# Patient Record
Sex: Female | Born: 1935
Health system: Southern US, Community
[De-identification: ages and names within clinical notes are randomized; demographics above are authoritative.]

## PROBLEM LIST (undated history)

## (undated) DIAGNOSIS — C50919 Malignant neoplasm of unspecified site of unspecified female breast: Secondary | ICD-10-CM

## (undated) DIAGNOSIS — N182 Chronic kidney disease, stage 2 (mild): Secondary | ICD-10-CM

## (undated) DIAGNOSIS — H9190 Unspecified hearing loss, unspecified ear: Secondary | ICD-10-CM

## (undated) DIAGNOSIS — E079 Disorder of thyroid, unspecified: Secondary | ICD-10-CM

## (undated) DIAGNOSIS — M109 Gout, unspecified: Secondary | ICD-10-CM

## (undated) DIAGNOSIS — E785 Hyperlipidemia, unspecified: Secondary | ICD-10-CM

## (undated) DIAGNOSIS — R0602 Shortness of breath: Secondary | ICD-10-CM

## (undated) DIAGNOSIS — K219 Gastro-esophageal reflux disease without esophagitis: Secondary | ICD-10-CM

## (undated) DIAGNOSIS — H409 Unspecified glaucoma: Secondary | ICD-10-CM

## (undated) DIAGNOSIS — E039 Hypothyroidism, unspecified: Secondary | ICD-10-CM

## (undated) DIAGNOSIS — Z95828 Presence of other vascular implants and grafts: Secondary | ICD-10-CM

## (undated) DIAGNOSIS — J449 Chronic obstructive pulmonary disease, unspecified: Secondary | ICD-10-CM

## (undated) DIAGNOSIS — I10 Essential (primary) hypertension: Secondary | ICD-10-CM

## (undated) DIAGNOSIS — Z9981 Dependence on supplemental oxygen: Secondary | ICD-10-CM

## (undated) HISTORY — DX: Chronic obstructive pulmonary disease, unspecified: J44.9

## (undated) HISTORY — DX: Essential (primary) hypertension: I10

## (undated) HISTORY — DX: Shortness of breath: R06.02

## (undated) HISTORY — DX: Hyperlipidemia, unspecified: E78.5

## (undated) HISTORY — DX: Malignant neoplasm of unspecified site of unspecified female breast: C50.919

## (undated) HISTORY — DX: Presence of other vascular implants and grafts: Z95.828

## (undated) HISTORY — PX: TONSILLECTOMY: SUR1361

## (undated) HISTORY — DX: Gout, unspecified: M10.9

## (undated) HISTORY — DX: Chronic kidney disease, stage 2 (mild): N18.2

## (undated) HISTORY — DX: Disorder of thyroid, unspecified: E07.9

---
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
## 1988-09-02 HISTORY — PX: CHOLECYSTECTOMY: SHX55

## 1992-09-02 HISTORY — PX: THYROID SURGERY: SHX805

## 1994-09-02 HISTORY — PX: TOTAL HIP ARTHROPLASTY: SHX124

## 1996-09-02 HISTORY — PX: ANKLE SURGERY: SHX546

## 1998-06-26 ENCOUNTER — Ambulatory Visit (HOSPITAL_BASED_OUTPATIENT_CLINIC_OR_DEPARTMENT_OTHER): Admission: RE | Admit: 1998-06-26 | Discharge: 1998-06-26 | Payer: Self-pay | Admitting: Orthopedic Surgery

## 2000-03-31 ENCOUNTER — Encounter: Admission: RE | Admit: 2000-03-31 | Discharge: 2000-03-31 | Payer: Self-pay | Admitting: Orthopedic Surgery

## 2000-03-31 ENCOUNTER — Ambulatory Visit (HOSPITAL_BASED_OUTPATIENT_CLINIC_OR_DEPARTMENT_OTHER): Admission: RE | Admit: 2000-03-31 | Discharge: 2000-03-31 | Payer: Self-pay | Admitting: Orthopedic Surgery

## 2000-03-31 ENCOUNTER — Encounter: Payer: Self-pay | Admitting: Orthopedic Surgery

## 2001-01-27 ENCOUNTER — Ambulatory Visit (HOSPITAL_COMMUNITY): Admission: RE | Admit: 2001-01-27 | Discharge: 2001-01-27 | Payer: Self-pay | Admitting: Internal Medicine

## 2001-01-27 ENCOUNTER — Encounter: Payer: Self-pay | Admitting: Internal Medicine

## 2002-01-28 ENCOUNTER — Encounter: Payer: Self-pay | Admitting: Internal Medicine

## 2002-01-28 ENCOUNTER — Ambulatory Visit (HOSPITAL_COMMUNITY): Admission: RE | Admit: 2002-01-28 | Discharge: 2002-01-28 | Payer: Self-pay | Admitting: Internal Medicine

## 2003-02-03 ENCOUNTER — Encounter: Payer: Self-pay | Admitting: Internal Medicine

## 2003-02-03 ENCOUNTER — Ambulatory Visit (HOSPITAL_COMMUNITY): Admission: RE | Admit: 2003-02-03 | Discharge: 2003-02-03 | Payer: Self-pay | Admitting: Internal Medicine

## 2004-02-06 ENCOUNTER — Ambulatory Visit (HOSPITAL_COMMUNITY): Admission: RE | Admit: 2004-02-06 | Discharge: 2004-02-06 | Payer: Self-pay | Admitting: Internal Medicine

## 2004-03-19 ENCOUNTER — Ambulatory Visit (HOSPITAL_COMMUNITY): Admission: RE | Admit: 2004-03-19 | Discharge: 2004-03-19 | Payer: Self-pay | Admitting: Internal Medicine

## 2005-02-13 ENCOUNTER — Ambulatory Visit (HOSPITAL_COMMUNITY): Admission: RE | Admit: 2005-02-13 | Discharge: 2005-02-13 | Payer: Self-pay | Admitting: Internal Medicine

## 2006-02-17 ENCOUNTER — Ambulatory Visit (HOSPITAL_COMMUNITY): Admission: RE | Admit: 2006-02-17 | Discharge: 2006-02-17 | Payer: Self-pay | Admitting: Internal Medicine

## 2007-03-04 ENCOUNTER — Ambulatory Visit (HOSPITAL_COMMUNITY): Admission: RE | Admit: 2007-03-04 | Discharge: 2007-03-04 | Payer: Self-pay | Admitting: Internal Medicine

## 2007-09-09 ENCOUNTER — Ambulatory Visit (HOSPITAL_COMMUNITY): Admission: RE | Admit: 2007-09-09 | Discharge: 2007-09-09 | Payer: Self-pay | Admitting: Internal Medicine

## 2008-03-09 ENCOUNTER — Ambulatory Visit (HOSPITAL_COMMUNITY): Admission: RE | Admit: 2008-03-09 | Discharge: 2008-03-09 | Payer: Self-pay | Admitting: Internal Medicine

## 2008-12-07 ENCOUNTER — Ambulatory Visit (HOSPITAL_COMMUNITY): Admission: RE | Admit: 2008-12-07 | Discharge: 2008-12-07 | Payer: Self-pay | Admitting: Internal Medicine

## 2009-03-15 ENCOUNTER — Ambulatory Visit (HOSPITAL_COMMUNITY): Admission: RE | Admit: 2009-03-15 | Discharge: 2009-03-15 | Payer: Self-pay | Admitting: Internal Medicine

## 2009-09-02 HISTORY — PX: BREAST LUMPECTOMY: SHX2

## 2010-03-08 DIAGNOSIS — C50819 Malignant neoplasm of overlapping sites of unspecified female breast: Secondary | ICD-10-CM | POA: Insufficient documentation

## 2010-03-16 ENCOUNTER — Ambulatory Visit (HOSPITAL_COMMUNITY): Admission: RE | Admit: 2010-03-16 | Discharge: 2010-03-16 | Payer: Self-pay | Admitting: Internal Medicine

## 2010-03-28 ENCOUNTER — Ambulatory Visit (HOSPITAL_COMMUNITY): Admission: RE | Admit: 2010-03-28 | Discharge: 2010-03-28 | Payer: Self-pay | Admitting: Internal Medicine

## 2010-04-02 ENCOUNTER — Ambulatory Visit (HOSPITAL_COMMUNITY): Admission: RE | Admit: 2010-04-02 | Discharge: 2010-04-02 | Payer: Self-pay | Admitting: Internal Medicine

## 2010-04-04 ENCOUNTER — Ambulatory Visit (HOSPITAL_COMMUNITY): Admission: RE | Admit: 2010-04-04 | Discharge: 2010-04-04 | Payer: Self-pay | Admitting: Internal Medicine

## 2010-04-20 ENCOUNTER — Encounter: Admission: RE | Admit: 2010-04-20 | Discharge: 2010-04-20 | Payer: Self-pay | Admitting: Surgery

## 2010-04-25 ENCOUNTER — Encounter: Admission: RE | Admit: 2010-04-25 | Discharge: 2010-04-25 | Payer: Self-pay | Admitting: Surgery

## 2010-04-25 ENCOUNTER — Ambulatory Visit (HOSPITAL_BASED_OUTPATIENT_CLINIC_OR_DEPARTMENT_OTHER): Admission: RE | Admit: 2010-04-25 | Discharge: 2010-04-25 | Payer: Self-pay | Admitting: Surgery

## 2010-05-08 ENCOUNTER — Ambulatory Visit: Admission: RE | Admit: 2010-05-08 | Discharge: 2010-07-09 | Payer: Self-pay | Admitting: Radiation Oncology

## 2010-05-09 ENCOUNTER — Ambulatory Visit (HOSPITAL_COMMUNITY): Payer: Self-pay | Admitting: Oncology

## 2010-05-09 ENCOUNTER — Encounter (HOSPITAL_COMMUNITY): Admission: RE | Admit: 2010-05-09 | Discharge: 2010-06-01 | Payer: Self-pay | Admitting: Oncology

## 2010-05-11 ENCOUNTER — Encounter (HOSPITAL_COMMUNITY): Payer: Self-pay | Admitting: Oncology

## 2010-05-11 ENCOUNTER — Ambulatory Visit: Payer: Self-pay | Admitting: Cardiology

## 2010-05-23 ENCOUNTER — Ambulatory Visit (HOSPITAL_BASED_OUTPATIENT_CLINIC_OR_DEPARTMENT_OTHER): Admission: RE | Admit: 2010-05-23 | Discharge: 2010-05-23 | Payer: Self-pay | Admitting: Surgery

## 2010-05-23 HISTORY — PX: PORTACATH PLACEMENT: SHX2246

## 2010-06-02 ENCOUNTER — Emergency Department (HOSPITAL_COMMUNITY): Admission: EM | Admit: 2010-06-02 | Discharge: 2010-06-02 | Payer: Self-pay | Admitting: Emergency Medicine

## 2010-06-06 ENCOUNTER — Encounter (HOSPITAL_COMMUNITY): Admission: RE | Admit: 2010-06-06 | Discharge: 2010-07-06 | Payer: Self-pay | Admitting: Oncology

## 2010-06-25 ENCOUNTER — Ambulatory Visit (HOSPITAL_COMMUNITY): Payer: Self-pay | Admitting: Oncology

## 2010-07-16 ENCOUNTER — Encounter (HOSPITAL_COMMUNITY)
Admission: RE | Admit: 2010-07-16 | Discharge: 2010-08-15 | Payer: Self-pay | Source: Home / Self Care | Attending: Oncology | Admitting: Oncology

## 2010-07-21 ENCOUNTER — Ambulatory Visit
Admission: RE | Admit: 2010-07-21 | Discharge: 2010-08-20 | Payer: Self-pay | Source: Home / Self Care | Attending: Radiation Oncology | Admitting: Radiation Oncology

## 2010-08-15 ENCOUNTER — Encounter (HOSPITAL_COMMUNITY)
Admission: RE | Admit: 2010-08-15 | Discharge: 2010-09-14 | Payer: Self-pay | Source: Home / Self Care | Attending: Oncology | Admitting: Oncology

## 2010-08-15 ENCOUNTER — Ambulatory Visit (HOSPITAL_COMMUNITY): Payer: Self-pay | Admitting: Oncology

## 2010-08-16 ENCOUNTER — Encounter (HOSPITAL_COMMUNITY): Payer: Self-pay | Admitting: Oncology

## 2010-09-04 ENCOUNTER — Ambulatory Visit
Admission: RE | Admit: 2010-09-04 | Discharge: 2010-10-02 | Payer: Self-pay | Source: Home / Self Care | Attending: Radiation Oncology | Admitting: Radiation Oncology

## 2010-09-05 LAB — COMPREHENSIVE METABOLIC PANEL
ALT: 17 U/L (ref 0–35)
AST: 21 U/L (ref 0–37)
Albumin: 3.8 g/dL (ref 3.5–5.2)
Alkaline Phosphatase: 66 U/L (ref 39–117)
BUN: 22 mg/dL (ref 6–23)
CO2: 25 mEq/L (ref 19–32)
Calcium: 9.5 mg/dL (ref 8.4–10.5)
Chloride: 101 mEq/L (ref 96–112)
Creatinine, Ser: 0.87 mg/dL (ref 0.4–1.2)
GFR calc Af Amer: >60 mL/min (ref >60)
GFR calc non Af Amer: >60 mL/min (ref >60)
Glucose, Bld: 115 mg/dL — ABNORMAL HIGH (ref 70–99)
Potassium: 4 mEq/L (ref 3.5–5.1)
Sodium: 139 mEq/L (ref 135–145)
Total Bilirubin: 0.3 mg/dL (ref 0.3–1.2)
Total Protein: 7.3 g/dL (ref 6.0–8.3)

## 2010-09-05 LAB — CBC
HCT: 31.3 % — ABNORMAL LOW (ref 36.0–46.0)
Hemoglobin: 11 g/dL — ABNORMAL LOW (ref 12.0–15.0)
MCH: 32.1 pg (ref 26.0–34.0)
MCHC: 35.1 g/dL (ref 30.0–36.0)
MCV: 91.3 fL (ref 78.0–100.0)
Platelets: 213 10*3/uL (ref 150–400)
RBC: 3.43 MIL/uL — ABNORMAL LOW (ref 3.87–5.11)
RDW: 14.7 % (ref 11.5–15.5)
WBC: 5.1 10*3/uL (ref 4.0–10.5)

## 2010-09-05 LAB — DIFFERENTIAL
Basophils Absolute: 0 10*3/uL (ref 0.0–0.1)
Basophils Relative: 0 % (ref 0–1)
Eosinophils Absolute: 0.2 10*3/uL (ref 0.0–0.7)
Eosinophils Relative: 4 % (ref 0–5)
Lymphocytes Relative: 29 % (ref 12–46)
Lymphs Abs: 1.5 10*3/uL (ref 0.7–4.0)
Monocytes Absolute: 0.5 10*3/uL (ref 0.1–1.0)
Monocytes Relative: 10 % (ref 3–12)
Neutro Abs: 2.9 10*3/uL (ref 1.7–7.7)
Neutrophils Relative %: 56 % (ref 43–77)

## 2010-09-26 ENCOUNTER — Encounter (HOSPITAL_COMMUNITY)
Admission: RE | Admit: 2010-09-26 | Discharge: 2010-10-02 | Payer: Self-pay | Source: Home / Self Care | Attending: Oncology | Admitting: Oncology

## 2010-09-26 LAB — CBC
Hemoglobin: 11.3 g/dL — ABNORMAL LOW (ref 12.0–15.0)
MCH: 31.4 pg (ref 26.0–34.0)
MCV: 90.8 fL (ref 78.0–100.0)
RBC: 3.6 MIL/uL — ABNORMAL LOW (ref 3.87–5.11)

## 2010-09-26 LAB — COMPREHENSIVE METABOLIC PANEL
BUN: 22 mg/dL (ref 6–23)
CO2: 26 mEq/L (ref 19–32)
Chloride: 101 mEq/L (ref 96–112)
Creatinine, Ser: 0.87 mg/dL (ref 0.4–1.2)
GFR calc non Af Amer: >60 mL/min (ref >60)
Total Bilirubin: 0.3 mg/dL (ref 0.3–1.2)

## 2010-09-26 LAB — DIFFERENTIAL
Eosinophils Absolute: 0.5 10*3/uL (ref 0.0–0.7)
Lymphs Abs: 1.4 10*3/uL (ref 0.7–4.0)
Monocytes Relative: 9 % (ref 3–12)
Neutro Abs: 3.1 10*3/uL (ref 1.7–7.7)
Neutrophils Relative %: 56 % (ref 43–77)

## 2010-10-03 ENCOUNTER — Ambulatory Visit: Payer: Self-pay | Admitting: Radiation Oncology

## 2010-10-16 ENCOUNTER — Inpatient Hospital Stay (HOSPITAL_COMMUNITY): Payer: Medicare Other

## 2010-10-16 DIAGNOSIS — C50919 Malignant neoplasm of unspecified site of unspecified female breast: Secondary | ICD-10-CM

## 2010-10-16 DIAGNOSIS — Z5112 Encounter for antineoplastic immunotherapy: Secondary | ICD-10-CM

## 2010-11-01 ENCOUNTER — Other Ambulatory Visit: Payer: Self-pay | Admitting: Cardiology

## 2010-11-05 ENCOUNTER — Ambulatory Visit (HOSPITAL_COMMUNITY): Payer: Medicare Other | Admitting: Oncology

## 2010-11-05 DIAGNOSIS — C50919 Malignant neoplasm of unspecified site of unspecified female breast: Secondary | ICD-10-CM

## 2010-11-06 ENCOUNTER — Encounter (HOSPITAL_COMMUNITY): Payer: Medicare Other | Attending: Oncology

## 2010-11-06 ENCOUNTER — Other Ambulatory Visit (HOSPITAL_COMMUNITY): Payer: Medicare Other

## 2010-11-06 ENCOUNTER — Inpatient Hospital Stay (HOSPITAL_COMMUNITY): Payer: Medicare Other

## 2010-11-06 DIAGNOSIS — C50919 Malignant neoplasm of unspecified site of unspecified female breast: Secondary | ICD-10-CM | POA: Insufficient documentation

## 2010-11-06 DIAGNOSIS — E119 Type 2 diabetes mellitus without complications: Secondary | ICD-10-CM | POA: Insufficient documentation

## 2010-11-06 DIAGNOSIS — E039 Hypothyroidism, unspecified: Secondary | ICD-10-CM | POA: Insufficient documentation

## 2010-11-06 DIAGNOSIS — Z5112 Encounter for antineoplastic immunotherapy: Secondary | ICD-10-CM

## 2010-11-06 DIAGNOSIS — I10 Essential (primary) hypertension: Secondary | ICD-10-CM | POA: Insufficient documentation

## 2010-11-06 DIAGNOSIS — Z79899 Other long term (current) drug therapy: Secondary | ICD-10-CM | POA: Insufficient documentation

## 2010-11-13 LAB — COMPREHENSIVE METABOLIC PANEL
ALT: 15 U/L (ref 0–35)
ALT: 18 U/L (ref 0–35)
AST: 19 U/L (ref 0–37)
AST: 33 U/L (ref 0–37)
Albumin: 3.5 g/dL (ref 3.5–5.2)
BUN: 20 mg/dL (ref 6–23)
CO2: 20 mEq/L (ref 19–32)
CO2: 26 mEq/L (ref 19–32)
Calcium: 8.4 mg/dL (ref 8.4–10.5)
Calcium: 8.7 mg/dL (ref 8.4–10.5)
Calcium: 9.3 mg/dL (ref 8.4–10.5)
Chloride: 106 mEq/L (ref 96–112)
Chloride: 106 mEq/L (ref 96–112)
Creatinine, Ser: 0.89 mg/dL (ref 0.4–1.2)
Creatinine, Ser: 1.02 mg/dL (ref 0.4–1.2)
GFR calc Af Amer: >60 mL/min (ref >60)
GFR calc non Af Amer: 58 mL/min — ABNORMAL LOW (ref >60)
GFR calc non Af Amer: >60 mL/min (ref >60)
Glucose, Bld: 307 mg/dL — ABNORMAL HIGH (ref 70–99)
Potassium: 4 mEq/L (ref 3.5–5.1)
Sodium: 140 mEq/L (ref 135–145)
Total Bilirubin: 0.2 mg/dL — ABNORMAL LOW (ref 0.3–1.2)
Total Protein: 7.1 g/dL (ref 6.0–8.3)

## 2010-11-13 LAB — CBC
HCT: 29.1 % — ABNORMAL LOW (ref 36.0–46.0)
Hemoglobin: 10.3 g/dL — ABNORMAL LOW (ref 12.0–15.0)
Hemoglobin: 9.3 g/dL — ABNORMAL LOW (ref 12.0–15.0)
MCH: 30.7 pg (ref 26.0–34.0)
MCH: 30.9 pg (ref 26.0–34.0)
MCHC: 35 g/dL (ref 30.0–36.0)
MCHC: 35.4 g/dL (ref 30.0–36.0)
MCV: 88.2 fL (ref 78.0–100.0)
Platelets: 206 10*3/uL (ref 150–400)
RBC: 3.03 MIL/uL — ABNORMAL LOW (ref 3.87–5.11)
RBC: 3.35 MIL/uL — ABNORMAL LOW (ref 3.87–5.11)
RDW: 15.4 % (ref 11.5–15.5)
WBC: 5.4 10*3/uL (ref 4.0–10.5)

## 2010-11-13 LAB — DIFFERENTIAL
Basophils Absolute: 0 10*3/uL (ref 0.0–0.1)
Eosinophils Absolute: 0 10*3/uL (ref 0.0–0.7)
Eosinophils Absolute: 0 10*3/uL (ref 0.0–0.7)
Eosinophils Relative: 0 % (ref 0–5)
Eosinophils Relative: 1 % (ref 0–5)
Lymphocytes Relative: 18 % (ref 12–46)
Lymphs Abs: 0.8 10*3/uL (ref 0.7–4.0)
Lymphs Abs: 1 10*3/uL (ref 0.7–4.0)
Lymphs Abs: 1.5 10*3/uL (ref 0.7–4.0)
Monocytes Absolute: 0 10*3/uL — ABNORMAL LOW (ref 0.1–1.0)
Monocytes Absolute: 0.5 10*3/uL (ref 0.1–1.0)
Monocytes Relative: 0 % — ABNORMAL LOW (ref 3–12)
Neutro Abs: 4.4 10*3/uL (ref 1.7–7.7)
Neutrophils Relative %: 80 % — ABNORMAL HIGH (ref 43–77)
Neutrophils Relative %: 81 % — ABNORMAL HIGH (ref 43–77)

## 2010-11-14 ENCOUNTER — Ambulatory Visit (HOSPITAL_COMMUNITY)
Admission: RE | Admit: 2010-11-14 | Discharge: 2010-11-14 | Disposition: A | Discharge status: Home / Self Care | Payer: Medicare Other | Source: Ambulatory Visit | Attending: Oncology | Admitting: Oncology

## 2010-11-14 DIAGNOSIS — C50919 Malignant neoplasm of unspecified site of unspecified female breast: Secondary | ICD-10-CM | POA: Insufficient documentation

## 2010-11-14 DIAGNOSIS — I10 Essential (primary) hypertension: Secondary | ICD-10-CM | POA: Insufficient documentation

## 2010-11-14 DIAGNOSIS — I059 Rheumatic mitral valve disease, unspecified: Secondary | ICD-10-CM

## 2010-11-14 DIAGNOSIS — Z9221 Personal history of antineoplastic chemotherapy: Secondary | ICD-10-CM | POA: Insufficient documentation

## 2010-11-14 DIAGNOSIS — E119 Type 2 diabetes mellitus without complications: Secondary | ICD-10-CM | POA: Insufficient documentation

## 2010-11-15 LAB — CBC
HCT: 32.3 % — ABNORMAL LOW (ref 36.0–46.0)
HCT: 33.5 % — ABNORMAL LOW (ref 36.0–46.0)
HCT: 33.7 % — ABNORMAL LOW (ref 36.0–46.0)
HCT: 37.9 % (ref 36.0–46.0)
Hemoglobin: 11.3 g/dL — ABNORMAL LOW (ref 12.0–15.0)
Hemoglobin: 11.5 g/dL — ABNORMAL LOW (ref 12.0–15.0)
Hemoglobin: 11.7 g/dL — ABNORMAL LOW (ref 12.0–15.0)
Hemoglobin: 12.1 g/dL (ref 12.0–15.0)
Hemoglobin: 12.8 g/dL (ref 12.0–15.0)
MCH: 29.4 pg (ref 26.0–34.0)
MCH: 30.4 pg (ref 26.0–34.0)
MCH: 30.4 pg (ref 26.0–34.0)
MCH: 30.5 pg (ref 26.0–34.0)
MCH: 30.5 pg (ref 26.0–34.0)
MCHC: 33.1 g/dL (ref 30.0–36.0)
MCHC: 34.4 g/dL (ref 30.0–36.0)
MCHC: 34.8 g/dL (ref 30.0–36.0)
MCHC: 35 g/dL (ref 30.0–36.0)
MCV: 87.1 fL (ref 78.0–100.0)
MCV: 87.3 fL (ref 78.0–100.0)
MCV: 88.6 fL (ref 78.0–100.0)
MCV: 88.7 fL (ref 78.0–100.0)
Platelets: 139 K/uL — ABNORMAL LOW (ref 150–400)
Platelets: 180 10*3/uL (ref 150–400)
Platelets: 193 K/uL (ref 150–400)
Platelets: 220 K/uL (ref 150–400)
Platelets: 252 10*3/uL (ref 150–400)
RBC: 3.71 MIL/uL — ABNORMAL LOW (ref 3.87–5.11)
RBC: 3.78 MIL/uL — ABNORMAL LOW (ref 3.87–5.11)
RBC: 3.86 MIL/uL — ABNORMAL LOW (ref 3.87–5.11)
RBC: 3.98 MIL/uL (ref 3.87–5.11)
RBC: 4.28 MIL/uL (ref 3.87–5.11)
RBC: 4.66 MIL/uL (ref 3.87–5.11)
RDW: 12.9 % (ref 11.5–15.5)
RDW: 13.4 % (ref 11.5–15.5)
RDW: 13.6 % (ref 11.5–15.5)
RDW: 13.7 % (ref 11.5–15.5)
WBC: 10.5 K/uL (ref 4.0–10.5)
WBC: 2.4 K/uL — ABNORMAL LOW (ref 4.0–10.5)
WBC: 3.5 10*3/uL — ABNORMAL LOW (ref 4.0–10.5)
WBC: 5.6 K/uL (ref 4.0–10.5)
WBC: 8 10*3/uL (ref 4.0–10.5)

## 2010-11-15 LAB — COMPREHENSIVE METABOLIC PANEL WITH GFR
ALT: 18 U/L (ref 0–35)
ALT: 19 U/L (ref 0–35)
AST: 29 U/L (ref 0–37)
AST: 33 U/L (ref 0–37)
Albumin: 3.6 g/dL (ref 3.5–5.2)
Albumin: 3.7 g/dL (ref 3.5–5.2)
Alkaline Phosphatase: 70 U/L (ref 39–117)
Alkaline Phosphatase: 78 U/L (ref 39–117)
BUN: 20 mg/dL (ref 6–23)
BUN: 23 mg/dL (ref 6–23)
CO2: 23 meq/L (ref 19–32)
CO2: 25 meq/L (ref 19–32)
Calcium: 9.2 mg/dL (ref 8.4–10.5)
Calcium: 9.2 mg/dL (ref 8.4–10.5)
Chloride: 101 meq/L (ref 96–112)
Chloride: 103 meq/L (ref 96–112)
Creatinine, Ser: 0.93 mg/dL (ref 0.4–1.2)
Creatinine, Ser: 1.03 mg/dL (ref 0.4–1.2)
GFR calc Af Amer: >60 mL/min (ref >60)
GFR calc Af Amer: >60 mL/min (ref >60)
GFR calc non Af Amer: 52 mL/min — ABNORMAL LOW (ref >60)
GFR calc non Af Amer: 59 mL/min — ABNORMAL LOW (ref >60)
Glucose, Bld: 267 mg/dL — ABNORMAL HIGH (ref 70–99)
Glucose, Bld: 355 mg/dL — ABNORMAL HIGH (ref 70–99)
Potassium: 3.9 meq/L (ref 3.5–5.1)
Potassium: 4.2 meq/L (ref 3.5–5.1)
Sodium: 135 meq/L (ref 135–145)
Sodium: 136 meq/L (ref 135–145)
Total Bilirubin: 0.2 mg/dL — ABNORMAL LOW (ref 0.3–1.2)
Total Bilirubin: 0.4 mg/dL (ref 0.3–1.2)
Total Protein: 7 g/dL (ref 6.0–8.3)
Total Protein: 7.1 g/dL (ref 6.0–8.3)

## 2010-11-15 LAB — DIFFERENTIAL
Basophils Absolute: 0 10*3/uL (ref 0.0–0.1)
Basophils Absolute: 0 K/uL (ref 0.0–0.1)
Basophils Absolute: 0 K/uL (ref 0.0–0.1)
Basophils Absolute: 0.1 K/uL (ref 0.0–0.1)
Basophils Relative: 0 % (ref 0–1)
Basophils Relative: 0 % (ref 0–1)
Basophils Relative: 1 % (ref 0–1)
Basophils Relative: 1 % (ref 0–1)
Eosinophils Absolute: 0 K/uL (ref 0.0–0.7)
Eosinophils Absolute: 0 K/uL (ref 0.0–0.7)
Eosinophils Absolute: 0.2 10*3/uL (ref 0.0–0.7)
Eosinophils Absolute: 0.3 10*3/uL (ref 0.0–0.7)
Eosinophils Absolute: 0.3 K/uL (ref 0.0–0.7)
Eosinophils Absolute: 0.4 10*3/uL (ref 0.0–0.7)
Eosinophils Relative: 0 % (ref 0–5)
Eosinophils Relative: 0 % (ref 0–5)
Eosinophils Relative: 13 % — ABNORMAL HIGH (ref 0–5)
Eosinophils Relative: 4 % (ref 0–5)
Lymphocytes Relative: 15 % (ref 12–46)
Lymphocytes Relative: 16 % (ref 12–46)
Lymphocytes Relative: 24 % (ref 12–46)
Lymphocytes Relative: 24 % (ref 12–46)
Lymphocytes Relative: 43 % (ref 12–46)
Lymphs Abs: 0.8 10*3/uL (ref 0.7–4.0)
Lymphs Abs: 0.9 K/uL (ref 0.7–4.0)
Lymphs Abs: 1 K/uL (ref 0.7–4.0)
Lymphs Abs: 1.6 10*3/uL (ref 0.7–4.0)
Lymphs Abs: 1.6 K/uL (ref 0.7–4.0)
Monocytes Absolute: 0 K/uL — ABNORMAL LOW (ref 0.1–1.0)
Monocytes Absolute: 0.1 K/uL (ref 0.1–1.0)
Monocytes Absolute: 0.5 K/uL (ref 0.1–1.0)
Monocytes Absolute: 0.6 10*3/uL (ref 0.1–1.0)
Monocytes Relative: 1 % — ABNORMAL LOW (ref 3–12)
Monocytes Relative: 1 % — ABNORMAL LOW (ref 3–12)
Monocytes Relative: 23 % — ABNORMAL HIGH (ref 3–12)
Monocytes Relative: 3 % (ref 3–12)
Monocytes Relative: 9 % (ref 3–12)
Monocytes Relative: 9 % (ref 3–12)
Neutro Abs: 0.5 K/uL — ABNORMAL LOW (ref 1.7–7.7)
Neutro Abs: 2.1 10*3/uL (ref 1.7–7.7)
Neutro Abs: 4.7 K/uL (ref 1.7–7.7)
Neutro Abs: 5.2 10*3/uL (ref 1.7–7.7)
Neutro Abs: 8.7 K/uL — ABNORMAL HIGH (ref 1.7–7.7)
Neutrophils Relative %: 20 % — ABNORMAL LOW (ref 43–77)
Neutrophils Relative %: 61 % (ref 43–77)
Neutrophils Relative %: 65 % (ref 43–77)
Neutrophils Relative %: 83 % — ABNORMAL HIGH (ref 43–77)
Neutrophils Relative %: 84 % — ABNORMAL HIGH (ref 43–77)

## 2010-11-15 LAB — COMPREHENSIVE METABOLIC PANEL
ALT: 18 U/L (ref 0–35)
ALT: 22 U/L (ref 0–35)
AST: 23 U/L (ref 0–37)
Albumin: 3.9 g/dL (ref 3.5–5.2)
Alkaline Phosphatase: 84 U/L (ref 39–117)
BUN: 26 mg/dL — ABNORMAL HIGH (ref 6–23)
CO2: 27 mEq/L (ref 19–32)
Calcium: 9.7 mg/dL (ref 8.4–10.5)
Chloride: 101 mEq/L (ref 96–112)
Chloride: 104 mEq/L (ref 96–112)
Creatinine, Ser: 0.71 mg/dL (ref 0.4–1.2)
GFR calc Af Amer: >60 mL/min (ref >60)
Glucose, Bld: 99 mg/dL (ref 70–99)
Potassium: 5.4 mEq/L — ABNORMAL HIGH (ref 3.5–5.1)
Sodium: 137 mEq/L (ref 135–145)
Sodium: 140 mEq/L (ref 135–145)
Total Bilirubin: 0.5 mg/dL (ref 0.3–1.2)
Total Protein: 7.6 g/dL (ref 6.0–8.3)

## 2010-11-15 LAB — URINALYSIS, ROUTINE W REFLEX MICROSCOPIC
Bilirubin Urine: NEGATIVE
Glucose, UA: NEGATIVE mg/dL
Hgb urine dipstick: NEGATIVE
Ketones, ur: NEGATIVE mg/dL
pH: 6.5 (ref 5.0–8.0)

## 2010-11-15 LAB — CANCER ANTIGEN 27.29: CA 27.29: 19 U/mL (ref 0–39)

## 2010-11-15 LAB — BASIC METABOLIC PANEL WITH GFR
BUN: 19 mg/dL (ref 6–23)
CO2: 27 meq/L (ref 19–32)
Calcium: 9.4 mg/dL (ref 8.4–10.5)
Chloride: 108 meq/L (ref 96–112)
Creatinine, Ser: 0.69 mg/dL (ref 0.4–1.2)
GFR calc non Af Amer: >60 mL/min
Glucose, Bld: 129 mg/dL — ABNORMAL HIGH (ref 70–99)
Potassium: 4.6 meq/L (ref 3.5–5.1)
Sodium: 140 meq/L (ref 135–145)

## 2010-11-27 ENCOUNTER — Ambulatory Visit (HOSPITAL_COMMUNITY): Payer: Medicare Other

## 2010-11-27 DIAGNOSIS — Z5112 Encounter for antineoplastic immunotherapy: Secondary | ICD-10-CM

## 2010-11-27 DIAGNOSIS — C50919 Malignant neoplasm of unspecified site of unspecified female breast: Secondary | ICD-10-CM

## 2010-12-18 ENCOUNTER — Encounter (HOSPITAL_COMMUNITY): Payer: Medicare Other | Attending: Oncology

## 2010-12-18 ENCOUNTER — Inpatient Hospital Stay (HOSPITAL_COMMUNITY): Payer: Medicare Other

## 2010-12-18 DIAGNOSIS — E119 Type 2 diabetes mellitus without complications: Secondary | ICD-10-CM | POA: Insufficient documentation

## 2010-12-18 DIAGNOSIS — Z5111 Encounter for antineoplastic chemotherapy: Secondary | ICD-10-CM

## 2010-12-18 DIAGNOSIS — E039 Hypothyroidism, unspecified: Secondary | ICD-10-CM | POA: Insufficient documentation

## 2010-12-18 DIAGNOSIS — C50919 Malignant neoplasm of unspecified site of unspecified female breast: Secondary | ICD-10-CM | POA: Insufficient documentation

## 2010-12-18 DIAGNOSIS — I10 Essential (primary) hypertension: Secondary | ICD-10-CM | POA: Insufficient documentation

## 2010-12-18 DIAGNOSIS — Z79899 Other long term (current) drug therapy: Secondary | ICD-10-CM | POA: Insufficient documentation

## 2011-01-08 ENCOUNTER — Encounter (HOSPITAL_COMMUNITY): Payer: Medicare Other | Attending: Oncology

## 2011-01-08 ENCOUNTER — Other Ambulatory Visit (HOSPITAL_COMMUNITY): Payer: Self-pay | Admitting: Oncology

## 2011-01-08 ENCOUNTER — Inpatient Hospital Stay (HOSPITAL_COMMUNITY): Payer: Medicare Other

## 2011-01-08 DIAGNOSIS — Z79899 Other long term (current) drug therapy: Secondary | ICD-10-CM | POA: Insufficient documentation

## 2011-01-08 DIAGNOSIS — I10 Essential (primary) hypertension: Secondary | ICD-10-CM | POA: Insufficient documentation

## 2011-01-08 DIAGNOSIS — E119 Type 2 diabetes mellitus without complications: Secondary | ICD-10-CM | POA: Insufficient documentation

## 2011-01-08 DIAGNOSIS — C50919 Malignant neoplasm of unspecified site of unspecified female breast: Secondary | ICD-10-CM | POA: Insufficient documentation

## 2011-01-08 DIAGNOSIS — Z5112 Encounter for antineoplastic immunotherapy: Secondary | ICD-10-CM

## 2011-01-08 DIAGNOSIS — E039 Hypothyroidism, unspecified: Secondary | ICD-10-CM | POA: Insufficient documentation

## 2011-01-08 LAB — CBC
HCT: 32.8 % — ABNORMAL LOW (ref 36.0–46.0)
Hemoglobin: 10.9 g/dL — ABNORMAL LOW (ref 12.0–15.0)
MCV: 88.2 fL (ref 78.0–100.0)
Platelets: 196 10*3/uL (ref 150–400)
RBC: 3.72 MIL/uL — ABNORMAL LOW (ref 3.87–5.11)
WBC: 5.3 10*3/uL (ref 4.0–10.5)

## 2011-01-08 LAB — DIFFERENTIAL
Eosinophils Relative: 6 % — ABNORMAL HIGH (ref 0–5)
Lymphocytes Relative: 32 % (ref 12–46)
Lymphs Abs: 1.7 10*3/uL (ref 0.7–4.0)
Monocytes Relative: 9 % (ref 3–12)

## 2011-01-18 NOTE — Op Note (Signed)
[facility]  Patient:    Paige Rhodes                        MRN: [id number] Proc. Date: 03/31/00 Adm. Date:  [id number] Attending:  Twana First                           Operative Report  PREOPERATIVE DIAGNOSIS:  Retained hardware, right ankle, status post open reduction internal fixation trimalleolar fracture.  POSTOPERATIVE DIAGNOSIS:  Retained hardware, right ankle, status post open reduction internal fixation trimalleolar fracture.  PROCEDURE:  Removal of retained hardware, right ankle.  SURGEON:  Elana Alm. Thurston Hole, M.D.  ASSISTANT:  ______.  ANESTHESIA:  General.  OPERATIVE TIME:  Thirty minutes.  COMPLICATIONS:  None.  INDICATIONS FOR PROCEDURE:  Ms. Cargle is a 77 year old woman who is one and a half years post ORIF right ankle fracture.  The fracture has healed well but she is having hardware pain and is now to undergo removal of this hardware.  DESCRIPTION:  Ms. Figiel is brought to the operating room on March 31, 2000, placed on operative table in supine position.  After an adequate level of general anesthesia was obtained her right foot and leg was prepped using sterile Betadine and draped using sterile technique.  Leg was examined and calf tourniquet elevated 275 mm.  Initially through the previous medial malleolar incision a new 1.5 cm incision was made.  The underlying subcutaneous tissues were incised with a ______ with skin incision.  The screw heads were easily visualized and were removed along with the washers without complications.  Fracture was found to be well healed.  No signs of infection.  The lateral incision was also reopened through a 5 cm longitudinal incision.  The underlying subcutaneous tissues also incised revealing the underlying screw heads and plate.  Each of the screws were removed and the plate was removed.  No signs of infection and the fracture was found to be well healed.  Both wounds were  then irrigated and closed using 3-0 Vicryl and 4-0 nylon.  Sterile dressings were applied after the wound injected with 0.25% Marcaine and then the patient and tourniquet released, awakened, taken to recovery room in stable condition.  FOLLOW-UP CARE:  Ms. Feagan will be followed as an outpatient on Vicodin for pain.  See her back in the office in a week for sutures out and follow-up. DD:  03/31/00 TD:  03/31/00 Job: [id number]

## 2011-01-18 NOTE — Op Note (Signed)
NAME:  Paige Rhodes, Paige Rhodes                          ACCOUNT NO.:  [id number]   MEDICAL RECORD NO.:  [id number]                   PATIENT TYPE:  AMB   LOCATION:  DAY                                  FACILITY:  [facility]   PHYSICIAN:  Lionel December, M.D.                 DATE OF BIRTH:  1932/07/07   DATE OF PROCEDURE:  03/19/2004  DATE OF DISCHARGE:                                 OPERATIVE REPORT   PROCEDURE:  Total colonoscopy.   INDICATIONS FOR PROCEDURE:  [name] is a 77 year old Caucasian female who  is here for screening colonoscopy.  The procedure and risks were reviewed  with the patient, and informed consent was obtained.   PREOPERATIVE MEDICATIONS:  Demerol 15g IV, Versed 3 mg IV in divided dose.   FINDINGS:  The procedure was performed in the endoscopy suite.  The  patient's vital signs and O2 saturations were monitored during the procedure  and remained stable.  The patient was placed in the left lateral recumbent  position and rectal examination performed.  No abnormality noted on external  or digital exam.  The Olympus videoscope was placed into the rectum and  advanced into the region of the sigmoid colon and beyond.  The preparation  was satisfactory.  The scope was passed to the cecum which was identified by  the ileocecal valve and appendiceal stump.  Pictures were taken for the  record.  As the scope was withdrawn, the colonic mucosa was carefully  examined.  There were two tiny polyps, one at the distal transverse colon  and the other one at the sigmoid colon which were ablated by cold biopsy and  submitted in one container.  There were two tiny diverticula at the sigmoid  colon which were seen on the way in.  The rectal mucosa was normal.  The  scope was retroflexed to examine the anorectal junction which was  unremarkable.  The endoscope was straightened and withdrawn.  The patient  tolerated the procedure well.   FINAL DIAGNOSES:  1. Two tiny polyps which were ablated  by cold biopsy, one at the transverse     colon and another one at the sigmoid colon.  2. Two tiny diverticula at the sigmoid colon.   RECOMMENDATIONS:  1. High fiber diet.  2. I will be contacting the patient with the biopsy results and timing of     her next colonoscopy.  If one or both of these polyps are adenomas, she     will come back in 5 years but otherwise could wait 10 years before her     next screening exam.      ___________________________________________  Lionel December, M.D.   NR/MEDQ  D:  03/19/2004  T:  03/19/2004  Job:  [id number]   cc:   [name]. [name], M.D.  [address]  [place]  [place] [address]  Fax: [phone]

## 2011-01-29 ENCOUNTER — Encounter (HOSPITAL_COMMUNITY): Payer: Medicare Other | Admitting: Oncology

## 2011-01-29 DIAGNOSIS — C50919 Malignant neoplasm of unspecified site of unspecified female breast: Secondary | ICD-10-CM

## 2011-01-30 ENCOUNTER — Encounter (HOSPITAL_COMMUNITY): Payer: Medicare Other

## 2011-01-30 DIAGNOSIS — C50919 Malignant neoplasm of unspecified site of unspecified female breast: Secondary | ICD-10-CM

## 2011-01-30 DIAGNOSIS — Z5112 Encounter for antineoplastic immunotherapy: Secondary | ICD-10-CM

## 2011-02-04 ENCOUNTER — Other Ambulatory Visit (HOSPITAL_COMMUNITY): Payer: Medicare Other

## 2011-02-07 ENCOUNTER — Encounter (INDEPENDENT_AMBULATORY_CARE_PROVIDER_SITE_OTHER): Payer: Self-pay | Admitting: Surgery

## 2011-02-11 ENCOUNTER — Ambulatory Visit (HOSPITAL_COMMUNITY): Payer: Medicare Other

## 2011-02-11 ENCOUNTER — Ambulatory Visit (HOSPITAL_COMMUNITY)
Admission: RE | Admit: 2011-02-11 | Discharge: 2011-02-11 | Disposition: A | Discharge status: Home / Self Care | Payer: Medicare Other | Source: Ambulatory Visit | Attending: Oncology | Admitting: Oncology

## 2011-02-11 DIAGNOSIS — E119 Type 2 diabetes mellitus without complications: Secondary | ICD-10-CM | POA: Insufficient documentation

## 2011-02-11 DIAGNOSIS — Z09 Encounter for follow-up examination after completed treatment for conditions other than malignant neoplasm: Secondary | ICD-10-CM

## 2011-02-11 DIAGNOSIS — C50919 Malignant neoplasm of unspecified site of unspecified female breast: Secondary | ICD-10-CM | POA: Insufficient documentation

## 2011-02-11 DIAGNOSIS — Z9221 Personal history of antineoplastic chemotherapy: Secondary | ICD-10-CM | POA: Insufficient documentation

## 2011-02-11 DIAGNOSIS — I10 Essential (primary) hypertension: Secondary | ICD-10-CM | POA: Insufficient documentation

## 2011-02-11 DIAGNOSIS — Z79899 Other long term (current) drug therapy: Secondary | ICD-10-CM | POA: Insufficient documentation

## 2011-02-12 ENCOUNTER — Encounter (HOSPITAL_COMMUNITY): Payer: Self-pay | Admitting: *Deleted

## 2011-02-19 ENCOUNTER — Encounter (HOSPITAL_COMMUNITY): Payer: Self-pay | Admitting: *Deleted

## 2011-02-20 ENCOUNTER — Other Ambulatory Visit (HOSPITAL_COMMUNITY): Payer: Self-pay | Admitting: Oncology

## 2011-02-20 ENCOUNTER — Encounter (HOSPITAL_COMMUNITY): Payer: Medicare Other | Attending: Oncology

## 2011-02-20 DIAGNOSIS — I10 Essential (primary) hypertension: Secondary | ICD-10-CM | POA: Insufficient documentation

## 2011-02-20 DIAGNOSIS — E039 Hypothyroidism, unspecified: Secondary | ICD-10-CM | POA: Insufficient documentation

## 2011-02-20 DIAGNOSIS — Z79899 Other long term (current) drug therapy: Secondary | ICD-10-CM | POA: Insufficient documentation

## 2011-02-20 DIAGNOSIS — E119 Type 2 diabetes mellitus without complications: Secondary | ICD-10-CM | POA: Insufficient documentation

## 2011-02-20 DIAGNOSIS — C50919 Malignant neoplasm of unspecified site of unspecified female breast: Secondary | ICD-10-CM

## 2011-02-20 DIAGNOSIS — Z5111 Encounter for antineoplastic chemotherapy: Secondary | ICD-10-CM

## 2011-02-20 LAB — COMPREHENSIVE METABOLIC PANEL
ALT: 13 U/L (ref 0–35)
Alkaline Phosphatase: 71 U/L (ref 39–117)
CO2: 27 mEq/L (ref 19–32)
Calcium: 9.7 mg/dL (ref 8.4–10.5)
GFR calc Af Amer: >60 mL/min (ref >60)
GFR calc non Af Amer: >60 mL/min (ref >60)
Glucose, Bld: 118 mg/dL — ABNORMAL HIGH (ref 70–99)
Sodium: 138 mEq/L (ref 135–145)

## 2011-02-20 LAB — DIFFERENTIAL
Basophils Absolute: 0 10*3/uL (ref 0.0–0.1)
Eosinophils Absolute: 0.3 10*3/uL (ref 0.0–0.7)
Eosinophils Relative: 5 % (ref 0–5)
Lymphocytes Relative: 23 % (ref 12–46)

## 2011-02-20 LAB — CBC
Platelets: 194 10*3/uL (ref 150–400)
RDW: 13.8 % (ref 11.5–15.5)
WBC: 6 10*3/uL (ref 4.0–10.5)

## 2011-02-27 ENCOUNTER — Other Ambulatory Visit (HOSPITAL_COMMUNITY): Payer: Self-pay | Admitting: Internal Medicine

## 2011-02-27 DIAGNOSIS — Z139 Encounter for screening, unspecified: Secondary | ICD-10-CM

## 2011-02-27 DIAGNOSIS — C50919 Malignant neoplasm of unspecified site of unspecified female breast: Secondary | ICD-10-CM

## 2011-03-05 ENCOUNTER — Other Ambulatory Visit (HOSPITAL_COMMUNITY): Payer: Self-pay | Admitting: Oncology

## 2011-03-05 ENCOUNTER — Encounter (HOSPITAL_COMMUNITY): Payer: Self-pay | Admitting: Oncology

## 2011-03-05 DIAGNOSIS — C50919 Malignant neoplasm of unspecified site of unspecified female breast: Secondary | ICD-10-CM

## 2011-03-05 HISTORY — DX: Malignant neoplasm of unspecified site of unspecified female breast: C50.919

## 2011-03-12 ENCOUNTER — Encounter (HOSPITAL_COMMUNITY): Payer: Medicare Other | Attending: Oncology

## 2011-03-12 ENCOUNTER — Other Ambulatory Visit (HOSPITAL_COMMUNITY): Payer: Self-pay | Admitting: Oncology

## 2011-03-12 DIAGNOSIS — I10 Essential (primary) hypertension: Secondary | ICD-10-CM | POA: Insufficient documentation

## 2011-03-12 DIAGNOSIS — E119 Type 2 diabetes mellitus without complications: Secondary | ICD-10-CM | POA: Insufficient documentation

## 2011-03-12 DIAGNOSIS — C50919 Malignant neoplasm of unspecified site of unspecified female breast: Secondary | ICD-10-CM

## 2011-03-12 DIAGNOSIS — Z79899 Other long term (current) drug therapy: Secondary | ICD-10-CM | POA: Insufficient documentation

## 2011-03-12 DIAGNOSIS — E039 Hypothyroidism, unspecified: Secondary | ICD-10-CM | POA: Insufficient documentation

## 2011-03-12 LAB — COMPREHENSIVE METABOLIC PANEL
ALT: 14 U/L (ref 0–35)
Calcium: 9.9 mg/dL (ref 8.4–10.5)
GFR calc Af Amer: >60 mL/min (ref >60)
Glucose, Bld: 101 mg/dL — ABNORMAL HIGH (ref 70–99)
Sodium: 140 mEq/L (ref 135–145)
Total Protein: 7.4 g/dL (ref 6.0–8.3)

## 2011-03-13 ENCOUNTER — Encounter (HOSPITAL_BASED_OUTPATIENT_CLINIC_OR_DEPARTMENT_OTHER): Payer: Medicare Other

## 2011-03-13 ENCOUNTER — Ambulatory Visit (HOSPITAL_COMMUNITY): Payer: Medicare Other | Admitting: Oncology

## 2011-03-13 VITALS — BP 137/68 | HR 56 | Temp 97.1°F | Wt 171.4 lb

## 2011-03-13 DIAGNOSIS — Z5111 Encounter for antineoplastic chemotherapy: Secondary | ICD-10-CM

## 2011-03-13 DIAGNOSIS — C50919 Malignant neoplasm of unspecified site of unspecified female breast: Secondary | ICD-10-CM

## 2011-03-13 MED ORDER — HEPARIN SOD (PORK) LOCK FLUSH 100 UNIT/ML IV SOLN
INTRAVENOUS | Status: AC
  Administered 2011-03-13: 500 [IU]
  Filled 2011-03-13: qty 5

## 2011-03-13 MED ORDER — ALTEPLASE 2 MG IJ SOLR: 2.0000 mg | Freq: Once | INTRAMUSCULAR | Status: DC | PRN

## 2011-03-13 MED ORDER — SODIUM CHLORIDE 0.9 % IV SOLN
Freq: Once | INTRAVENOUS | Status: AC
  Administered 2011-03-13: 12:00:00 via INTRAVENOUS

## 2011-03-13 MED ORDER — SODIUM CHLORIDE 0.9 % IJ SOLN: 3.0000 mL | INTRAMUSCULAR | Status: DC | PRN

## 2011-03-13 MED ORDER — DIPHENHYDRAMINE HCL 50 MG/ML IJ SOLN: 25.0000 mg | Freq: Once | INTRAMUSCULAR | Status: DC | PRN

## 2011-03-13 MED ORDER — HEPARIN SOD (PORK) LOCK FLUSH 100 UNIT/ML IV SOLN: 250.0000 [IU] | Freq: Once | INTRAVENOUS | Status: DC | PRN

## 2011-03-13 MED ORDER — SODIUM CHLORIDE 0.9 % IJ SOLN: 10.0000 mL | INTRAMUSCULAR | Status: DC | PRN

## 2011-03-13 MED ORDER — EPINEPHRINE HCL 0.1 MG/ML IJ SOLN: 0.2500 mg | Freq: Once | INTRAMUSCULAR | Status: DC | PRN

## 2011-03-13 MED ORDER — SODIUM CHLORIDE 0.9 % IV SOLN: Freq: Once | INTRAVENOUS | Status: DC | PRN

## 2011-03-13 MED ORDER — DIPHENHYDRAMINE HCL 50 MG/ML IJ SOLN: 50.0000 mg | Freq: Once | INTRAMUSCULAR | Status: DC | PRN

## 2011-03-13 MED ORDER — HEPARIN SOD (PORK) LOCK FLUSH 100 UNIT/ML IV SOLN
500.0000 [IU] | Freq: Once | INTRAVENOUS | Status: AC | PRN
  Administered 2011-03-13: 500 [IU]

## 2011-03-13 MED ORDER — METHYLPREDNISOLONE SODIUM SUCC 125 MG IJ SOLR: 125.0000 mg | Freq: Once | INTRAMUSCULAR | Status: DC | PRN

## 2011-03-13 MED ORDER — ALBUTEROL SULFATE (2.5 MG/3ML) 0.083% IN NEBU: 2.5000 mg | INHALATION_SOLUTION | Freq: Once | RESPIRATORY_TRACT | Status: DC | PRN

## 2011-03-20 ENCOUNTER — Ambulatory Visit (HOSPITAL_COMMUNITY)
Admission: RE | Admit: 2011-03-20 | Discharge: 2011-03-20 | Disposition: A | Discharge status: Home / Self Care | Payer: Medicare Other | Source: Ambulatory Visit | Attending: Internal Medicine | Admitting: Internal Medicine

## 2011-03-20 DIAGNOSIS — Z09 Encounter for follow-up examination after completed treatment for conditions other than malignant neoplasm: Secondary | ICD-10-CM | POA: Insufficient documentation

## 2011-03-20 DIAGNOSIS — C50919 Malignant neoplasm of unspecified site of unspecified female breast: Secondary | ICD-10-CM | POA: Insufficient documentation

## 2011-04-02 NOTE — Progress Notes (Addendum)
Medication did not cross over to [facility]. Herceptin 456mg  IV given per protocol x 30 min total. Herceptin start time 1200 noon, stop time 1230 pm.

## 2011-04-03 ENCOUNTER — Encounter (HOSPITAL_COMMUNITY): Payer: Medicare Other | Attending: Oncology

## 2011-04-03 VITALS — BP 134/66 | HR 53 | Temp 97.6°F | Wt 171.0 lb

## 2011-04-03 DIAGNOSIS — Z5112 Encounter for antineoplastic immunotherapy: Secondary | ICD-10-CM

## 2011-04-03 DIAGNOSIS — C50919 Malignant neoplasm of unspecified site of unspecified female breast: Secondary | ICD-10-CM | POA: Insufficient documentation

## 2011-04-03 MED ORDER — HEPARIN SOD (PORK) LOCK FLUSH 100 UNIT/ML IV SOLN
500.0000 [IU] | Freq: Once | INTRAVENOUS | Status: AC | PRN
  Administered 2011-04-03: 500 [IU]

## 2011-04-03 MED ORDER — TRASTUZUMAB CHEMO INJECTION 440 MG
456.0000 mg | Freq: Once | INTRAVENOUS | Status: AC
  Administered 2011-04-03: 461.9996 mg via INTRAVENOUS
  Filled 2011-04-03: qty 22

## 2011-04-03 MED ORDER — SODIUM CHLORIDE 0.9 % IV SOLN
Freq: Once | INTRAVENOUS | Status: AC
  Administered 2011-04-03: 10:00:00 via INTRAVENOUS

## 2011-04-03 MED ORDER — SODIUM CHLORIDE 0.9 % IJ SOLN: 10.0000 mL | INTRAMUSCULAR | Status: DC | PRN

## 2011-04-05 ENCOUNTER — Telehealth (HOSPITAL_COMMUNITY): Payer: Self-pay | Admitting: *Deleted

## 2011-04-05 NOTE — Telephone Encounter (Signed)
Tolerated chemo without problems 

## 2011-04-11 MED FILL — Trastuzumab For IV Soln 440 MG: INTRAVENOUS | Qty: 456 | Status: AC

## 2011-04-22 ENCOUNTER — Encounter (HOSPITAL_BASED_OUTPATIENT_CLINIC_OR_DEPARTMENT_OTHER): Payer: Medicare Other | Admitting: Oncology

## 2011-04-22 VITALS — BP 150/78 | HR 54 | Temp 97.5°F | Wt 179.0 lb

## 2011-04-22 DIAGNOSIS — Z171 Estrogen receptor negative status [ER-]: Secondary | ICD-10-CM

## 2011-04-22 DIAGNOSIS — C50919 Malignant neoplasm of unspecified site of unspecified female breast: Secondary | ICD-10-CM

## 2011-04-22 NOTE — Progress Notes (Signed)
CC:   [name]. [name], MD Currie Paris, M.D. Lurline Hare, M.D.  DIAGNOSES: 1. Stage II (T2 N0) high-grade infiltrating ductal carcinoma of the     left breast 2.4 cm in size with a negative sentinel node that was     ER/PR negative, HER-2/neu positive.  Her Ki-67 marker was high at     80% and she had three lymph nodes all of which were negative for     metastatic disease.  No LVI was seen, but I elected to treated with     four cycles of carboplatin and Taxol and then Herceptin for 52     weeks.  She has two more treatments, one is this coming Wednesday,     04/24/2011, and the last will be 3 weeks later in September. 2. Diabetes mellitus, well controlled.  Her recent labs at Dr. Alonza Smoker     office in August showed a hemoglobin A1c of 6.5.  Her other labs     looked quite good.  Her CBC was unremarkable.  The urinalysis was     essentially unremarkable, except for an increase in leukocyte     esterase, but nothing else to go along with it.  ONCOLOGIC REVIEW OF SYSTEMS:  She has no fevers, chills or night sweats. No lumps anywhere.  No trouble swallowing.  No trouble breathing.  No chest pain.  No shortness of breath.  No trouble with swelling of her legs.  No bowel problems.  No GU problems.  No abdominal pain.  She tolerates the Herceptin extremely well.  She was inquiring as to whether or not she is disease-free at this point in time.  PHYSICAL EXAMINATION:  Vital Signs:  Her vital signs are very, very stable, though her weight is up to 179 pounds, which is up 8 pounds compared to 04/03/2011 as well as 03/13/2011.  She has no peripheral edema. Pulses are symmetrical.  Her pulse is right around 54-56 and regular. Respirations 16-18 and unlabored.  Blood pressure is 150/78.  She is afebrile.  Skin:  Warm and dry to the touch.  Breasts:  The left breast shows slight thickening of the breast skin and tissue, but no mass.  The right breast is negative for any masses.  The port is  intact in the right upper chest wall.  Extremities:  She has no arm or leg edema. Lungs:  Clear to auscultation and percussion.  Abdomen:  Soft and nontender without hepatosplenomegaly or masses.  Bowel sounds are normal.  Neurologic:  She is alert and oriented.  Facial symmetry is intact.  Speech is intact.  She answers all questions appropriately.  ASSESSMENT AND PLAN:  She really does look great.  We went over her labs from Dr. Alonza Smoker.  We went over her physical exam and, again, there is no evidence for recurrent disease at this juncture.  She does have a 2-D echo scheduled for September, which will probably be the last one we need.  We will see her back in 6 months.  I will see her sooner if need be.  I think she has done very, very well overall.  We spent 35-40 minutes together and I spent most of that time in discussing her care and in counseling with her about what we have done and what we still need to do.    ______________________________ [name]. Mariel Sleet, MD ESN/MEDQ  D:  04/22/2011  T:  04/22/2011  Job:  [id number]

## 2011-04-22 NOTE — Patient Instructions (Signed)
[facility]  Discharge Instructions  RECOMMENDATIONS MADE BY THE CONSULTANT AND ANY TEST RESULTS WILL BE SENT TO YOUR REFERRING DOCTOR.   EXAM FINDINGS BY MD TODAY AND SIGNS AND SYMPTOMS TO REPORT TO CLINIC OR PRIMARY MD:As scheduled.     SPECIAL INSTRUCTIONS/FOLLOW-UP: Continue as you are doing you look great!  You have two more Herceptin treatments.  2D Echo already scheduled for Sept 11th.   I acknowledge that I have been informed and understand all the instructions given to me and received a copy. I do not have any more questions at this time, but understand that I may call the Specialty Clinic at [facility] at [phone] during business hours should I have any further questions or need assistance in obtaining follow-up care.    __________________________________________  _____________  __________ Signature of Patient or Authorized Representative            Date                   Time    __________________________________________ Nurse's Signature

## 2011-04-22 NOTE — Progress Notes (Signed)
This office note has been dictated.

## 2011-04-23 ENCOUNTER — Other Ambulatory Visit (HOSPITAL_COMMUNITY): Payer: Medicare Other

## 2011-04-24 ENCOUNTER — Encounter (HOSPITAL_BASED_OUTPATIENT_CLINIC_OR_DEPARTMENT_OTHER): Payer: Medicare Other

## 2011-04-24 VITALS — BP 124/64 | HR 51 | Temp 97.5°F | Wt 171.6 lb

## 2011-04-24 DIAGNOSIS — C50919 Malignant neoplasm of unspecified site of unspecified female breast: Secondary | ICD-10-CM

## 2011-04-24 DIAGNOSIS — Z5112 Encounter for antineoplastic immunotherapy: Secondary | ICD-10-CM

## 2011-04-24 LAB — DIFFERENTIAL
Basophils Relative: 0 % (ref 0–1)
Eosinophils Absolute: 0.3 10*3/uL (ref 0.0–0.7)
Monocytes Relative: 10 % (ref 3–12)
Neutrophils Relative %: 58 % (ref 43–77)

## 2011-04-24 LAB — CBC
Hemoglobin: 11.6 g/dL — ABNORMAL LOW (ref 12.0–15.0)
MCH: 30.1 pg (ref 26.0–34.0)
MCHC: 34.1 g/dL (ref 30.0–36.0)
Platelets: 188 10*3/uL (ref 150–400)

## 2011-04-24 MED ORDER — HEPARIN SOD (PORK) LOCK FLUSH 100 UNIT/ML IV SOLN
500.0000 [IU] | Freq: Once | INTRAVENOUS | Status: AC | PRN
  Administered 2011-04-24: 500 [IU]

## 2011-04-24 MED ORDER — TRASTUZUMAB CHEMO INJECTION 440 MG
456.0000 mg | Freq: Once | INTRAVENOUS | Status: AC
  Administered 2011-04-24: 461.9996 mg via INTRAVENOUS
  Filled 2011-04-24: qty 22

## 2011-04-24 MED ORDER — SODIUM CHLORIDE 0.9 % IJ SOLN
10.0000 mL | INTRAMUSCULAR | Status: DC | PRN
  Administered 2011-04-24: 10 mL

## 2011-04-24 MED ORDER — SODIUM CHLORIDE 0.9 % IV SOLN
Freq: Once | INTRAVENOUS | Status: AC
  Administered 2011-04-24: 500 mL via INTRAVENOUS

## 2011-04-24 MED ORDER — HEPARIN SOD (PORK) LOCK FLUSH 100 UNIT/ML IV SOLN
INTRAVENOUS | Status: AC
  Administered 2011-04-24: 500 [IU]
  Filled 2011-04-24: qty 5

## 2011-04-24 NOTE — Progress Notes (Signed)
Pt. Tolerated chemo well.

## 2011-04-25 NOTE — Progress Notes (Signed)
24 Hour Chemotherapy Follow up Call  Paige Rhodes called at home following administration of Herceptin chemotherapy. Patient reports no complaints    Reviewed all post chemotherapy instructions with patient and when should call clinic or MD.  Patient verbalized understanding.

## 2011-04-29 ENCOUNTER — Ambulatory Visit (HOSPITAL_COMMUNITY)
Admission: RE | Admit: 2011-04-29 | Discharge: 2011-04-29 | Disposition: A | Discharge status: Home / Self Care | Payer: Medicare Other | Source: Ambulatory Visit | Attending: Internal Medicine | Admitting: Internal Medicine

## 2011-04-29 ENCOUNTER — Other Ambulatory Visit (HOSPITAL_COMMUNITY): Payer: Self-pay | Admitting: Internal Medicine

## 2011-04-29 DIAGNOSIS — M949 Disorder of cartilage, unspecified: Secondary | ICD-10-CM | POA: Insufficient documentation

## 2011-04-29 DIAGNOSIS — M25449 Effusion, unspecified hand: Secondary | ICD-10-CM | POA: Insufficient documentation

## 2011-04-29 DIAGNOSIS — W19XXXA Unspecified fall, initial encounter: Secondary | ICD-10-CM

## 2011-04-29 DIAGNOSIS — M899 Disorder of bone, unspecified: Secondary | ICD-10-CM | POA: Insufficient documentation

## 2011-04-29 DIAGNOSIS — M25549 Pain in joints of unspecified hand: Secondary | ICD-10-CM | POA: Insufficient documentation

## 2011-04-29 DIAGNOSIS — M11239 Other chondrocalcinosis, unspecified wrist: Secondary | ICD-10-CM | POA: Insufficient documentation

## 2011-05-14 ENCOUNTER — Ambulatory Visit (HOSPITAL_COMMUNITY): Payer: Medicare Other

## 2011-05-14 ENCOUNTER — Ambulatory Visit (HOSPITAL_COMMUNITY)
Admission: RE | Admit: 2011-05-14 | Discharge: 2011-05-14 | Disposition: A | Discharge status: Home / Self Care | Payer: Medicare Other | Source: Ambulatory Visit | Attending: Oncology | Admitting: Oncology

## 2011-05-14 DIAGNOSIS — I10 Essential (primary) hypertension: Secondary | ICD-10-CM | POA: Insufficient documentation

## 2011-05-14 DIAGNOSIS — C50919 Malignant neoplasm of unspecified site of unspecified female breast: Secondary | ICD-10-CM | POA: Insufficient documentation

## 2011-05-14 DIAGNOSIS — Z09 Encounter for follow-up examination after completed treatment for conditions other than malignant neoplasm: Secondary | ICD-10-CM | POA: Insufficient documentation

## 2011-05-14 DIAGNOSIS — Z9221 Personal history of antineoplastic chemotherapy: Secondary | ICD-10-CM | POA: Insufficient documentation

## 2011-05-14 DIAGNOSIS — E119 Type 2 diabetes mellitus without complications: Secondary | ICD-10-CM | POA: Insufficient documentation

## 2011-05-14 NOTE — Progress Notes (Signed)
*  PRELIMINARY RESULTS* Echocardiogram 2D Echocardiogram has been performed.  Paige Rhodes 05/14/2011, 1:54 PM

## 2011-05-15 ENCOUNTER — Encounter (HOSPITAL_COMMUNITY): Payer: Medicare Other | Attending: Oncology

## 2011-05-15 DIAGNOSIS — C50919 Malignant neoplasm of unspecified site of unspecified female breast: Secondary | ICD-10-CM

## 2011-05-15 DIAGNOSIS — Z5112 Encounter for antineoplastic immunotherapy: Secondary | ICD-10-CM

## 2011-05-15 MED ORDER — SODIUM CHLORIDE 0.9 % IJ SOLN
10.0000 mL | INTRAMUSCULAR | Status: DC | PRN
  Filled 2011-05-15: qty 10

## 2011-05-15 MED ORDER — HEPARIN SOD (PORK) LOCK FLUSH 100 UNIT/ML IV SOLN
500.0000 [IU] | Freq: Once | INTRAVENOUS | Status: AC | PRN
  Administered 2011-05-15: 500 [IU]
  Filled 2011-05-15: qty 5

## 2011-05-15 MED ORDER — TRASTUZUMAB CHEMO INJECTION 440 MG
456.0000 mg | Freq: Once | INTRAVENOUS | Status: AC
  Administered 2011-05-15: 461.9996 mg via INTRAVENOUS
  Filled 2011-05-15: qty 22

## 2011-05-15 MED ORDER — SODIUM CHLORIDE 0.9 % IV SOLN
Freq: Once | INTRAVENOUS | Status: AC
  Administered 2011-05-15: 11:00:00 via INTRAVENOUS

## 2011-05-28 ENCOUNTER — Ambulatory Visit (HOSPITAL_COMMUNITY): Payer: Medicare Other

## 2011-06-04 ENCOUNTER — Encounter (HOSPITAL_COMMUNITY): Payer: Medicare Other | Attending: Oncology

## 2011-06-04 DIAGNOSIS — C50919 Malignant neoplasm of unspecified site of unspecified female breast: Secondary | ICD-10-CM

## 2011-06-04 LAB — COMPREHENSIVE METABOLIC PANEL
Alkaline Phosphatase: 79 U/L (ref 39–117)
BUN: 22 mg/dL (ref 6–23)
CO2: 31 mEq/L (ref 19–32)
Chloride: 102 mEq/L (ref 96–112)
Creatinine, Ser: 0.87 mg/dL (ref 0.50–1.10)
GFR calc non Af Amer: 63 mL/min — ABNORMAL LOW (ref >90)
Total Bilirubin: 0.2 mg/dL — ABNORMAL LOW (ref 0.3–1.2)

## 2011-06-04 NOTE — Progress Notes (Signed)
Labs drawn today for cmp 

## 2011-06-26 ENCOUNTER — Ambulatory Visit (HOSPITAL_COMMUNITY)
Admission: RE | Admit: 2011-06-26 | Discharge: 2011-06-26 | Disposition: A | Discharge status: Home / Self Care | Payer: Medicare Other | Source: Ambulatory Visit | Attending: Internal Medicine | Admitting: Internal Medicine

## 2011-06-26 ENCOUNTER — Encounter (HOSPITAL_BASED_OUTPATIENT_CLINIC_OR_DEPARTMENT_OTHER): Payer: Medicare Other

## 2011-06-26 ENCOUNTER — Other Ambulatory Visit (HOSPITAL_COMMUNITY): Payer: Self-pay | Admitting: Internal Medicine

## 2011-06-26 DIAGNOSIS — R0602 Shortness of breath: Secondary | ICD-10-CM

## 2011-06-26 DIAGNOSIS — Z452 Encounter for adjustment and management of vascular access device: Secondary | ICD-10-CM

## 2011-06-26 DIAGNOSIS — R05 Cough: Secondary | ICD-10-CM | POA: Insufficient documentation

## 2011-06-26 DIAGNOSIS — C50919 Malignant neoplasm of unspecified site of unspecified female breast: Secondary | ICD-10-CM

## 2011-06-26 DIAGNOSIS — I10 Essential (primary) hypertension: Secondary | ICD-10-CM | POA: Insufficient documentation

## 2011-06-26 DIAGNOSIS — J4489 Other specified chronic obstructive pulmonary disease: Secondary | ICD-10-CM | POA: Insufficient documentation

## 2011-06-26 DIAGNOSIS — R059 Cough, unspecified: Secondary | ICD-10-CM | POA: Insufficient documentation

## 2011-06-26 DIAGNOSIS — J449 Chronic obstructive pulmonary disease, unspecified: Secondary | ICD-10-CM | POA: Insufficient documentation

## 2011-06-26 LAB — BLOOD GAS, ARTERIAL
Bicarbonate: 27.5 mEq/L — ABNORMAL HIGH (ref 20.0–24.0)
Patient temperature: 37
pH, Arterial: 7.414 — ABNORMAL HIGH (ref 7.350–7.400)
pO2, Arterial: 54.1 mmHg — ABNORMAL LOW (ref 80.0–100.0)

## 2011-06-26 MED ORDER — SODIUM CHLORIDE 0.9 % IJ SOLN
INTRAMUSCULAR | Status: AC
  Administered 2011-06-26: 10 mL via INTRAVENOUS
  Filled 2011-06-26: qty 20

## 2011-06-26 MED ORDER — SODIUM CHLORIDE 0.9 % IJ SOLN
10.0000 mL | Freq: Once | INTRAMUSCULAR | Status: AC
  Administered 2011-06-26: 10 mL via INTRAVENOUS
  Filled 2011-06-26: qty 10

## 2011-06-26 MED ORDER — HEPARIN SOD (PORK) LOCK FLUSH 100 UNIT/ML IV SOLN
INTRAVENOUS | Status: AC
  Administered 2011-06-26: 500 [IU] via INTRAVENOUS
  Filled 2011-06-26: qty 5

## 2011-06-26 MED ORDER — ALBUTEROL SULFATE (5 MG/ML) 0.5% IN NEBU
2.5000 mg | INHALATION_SOLUTION | Freq: Once | RESPIRATORY_TRACT | Status: AC
  Administered 2011-06-26: 2.5 mg via RESPIRATORY_TRACT

## 2011-06-26 MED ORDER — HEPARIN SOD (PORK) LOCK FLUSH 100 UNIT/ML IV SOLN
500.0000 [IU] | Freq: Once | INTRAVENOUS | Status: AC
  Administered 2011-06-26: 500 [IU] via INTRAVENOUS
  Filled 2011-06-26: qty 5

## 2011-06-26 NOTE — Progress Notes (Signed)
Zabdi F Brahmbhatt presented for Portacath access and flush. Proper placement of portacath confirmed by CXR. Portacath located rt chest wall accessed with  H 20 needle. Good blood return present. Portacath flushed with 20ml NS and 500U/5ml Heparin and needle removed intact. Procedure without incident. Patient tolerated procedure well.   

## 2011-07-01 NOTE — Procedures (Unsigned)
Paige Rhodes, Paige Rhodes                ACCOUNT NO.:  [id number]  MEDICAL RECORD NO.:  [id number]  LOCATION:                                 FACILITY:  PHYSICIAN:  Laddie Naeem L. Juanetta Gosling, M.D.DATE OF BIRTH:  01/15/1932  DATE OF PROCEDURE: DATE OF DISCHARGE:                           PULMONARY FUNCTION TEST   REASON FOR PULMONARY FUNCTION TESTING:  Shortness of breath.  1. Spirometry shows a moderate to severe ventilatory defect with     evidence of airflow obstruction. 2. Lung volumes are normal. 3. DLCO is severely reduced. 4. Arterial blood gas shows resting hypoxia. 5. There is improvement with inhaled bronchodilator that approaches     significance, but does not quite reach it 6. This is consistent with COPD.     Sevag Shearn L. Juanetta Gosling, M.D.     ELH/MEDQ  D:  07/01/2011  T:  07/01/2011  Job:  [id number]  cc:   [name]. [name], MD Fax: [phone]

## 2011-08-07 ENCOUNTER — Encounter (HOSPITAL_COMMUNITY): Payer: Medicare Other | Attending: Oncology

## 2011-08-07 DIAGNOSIS — C50919 Malignant neoplasm of unspecified site of unspecified female breast: Secondary | ICD-10-CM

## 2011-08-07 DIAGNOSIS — Z452 Encounter for adjustment and management of vascular access device: Secondary | ICD-10-CM

## 2011-08-07 MED ORDER — SODIUM CHLORIDE 0.9 % IJ SOLN
10.0000 mL | INTRAMUSCULAR | Status: DC | PRN
  Administered 2011-08-07: 10 mL via INTRAVENOUS
  Filled 2011-08-07: qty 10

## 2011-08-07 MED ORDER — SODIUM CHLORIDE 0.9 % IJ SOLN
INTRAMUSCULAR | Status: AC
  Administered 2011-08-07: 10 mL via INTRAVENOUS
  Filled 2011-08-07: qty 10

## 2011-08-07 MED ORDER — HEPARIN SOD (PORK) LOCK FLUSH 100 UNIT/ML IV SOLN
INTRAVENOUS | Status: AC
  Administered 2011-08-07: 500 [IU] via INTRAVENOUS
  Filled 2011-08-07: qty 5

## 2011-08-07 MED ORDER — HEPARIN SOD (PORK) LOCK FLUSH 100 UNIT/ML IV SOLN
500.0000 [IU] | Freq: Once | INTRAVENOUS | Status: AC
  Administered 2011-08-07: 500 [IU] via INTRAVENOUS
  Filled 2011-08-07: qty 5

## 2011-08-07 NOTE — Progress Notes (Signed)
Paige Rhodes presented for Portacath access and flush. Proper placement of portacath confirmed by CXR. Portacath located right chest wall accessed with  H 20 needle. Good blood return present. Portacath flushed with 20ml NS and 500U/5ml Heparin and needle removed intact. Procedure without incident. Patient tolerated procedure well.   

## 2011-09-18 ENCOUNTER — Encounter (HOSPITAL_COMMUNITY): Payer: Medicare Other | Attending: Oncology

## 2011-09-18 DIAGNOSIS — C50919 Malignant neoplasm of unspecified site of unspecified female breast: Secondary | ICD-10-CM | POA: Insufficient documentation

## 2011-09-18 DIAGNOSIS — C50419 Malignant neoplasm of upper-outer quadrant of unspecified female breast: Secondary | ICD-10-CM

## 2011-09-18 DIAGNOSIS — Z452 Encounter for adjustment and management of vascular access device: Secondary | ICD-10-CM

## 2011-09-18 MED ORDER — HEPARIN SOD (PORK) LOCK FLUSH 100 UNIT/ML IV SOLN
500.0000 [IU] | Freq: Once | INTRAVENOUS | Status: AC
  Administered 2011-09-18: 500 [IU] via INTRAVENOUS
  Filled 2011-09-18: qty 5

## 2011-09-18 MED ORDER — SODIUM CHLORIDE 0.9 % IJ SOLN
10.0000 mL | INTRAMUSCULAR | Status: DC | PRN
  Administered 2011-09-18: 10 mL via INTRAVENOUS
  Filled 2011-09-18: qty 10

## 2011-09-18 MED ORDER — HEPARIN SOD (PORK) LOCK FLUSH 100 UNIT/ML IV SOLN
INTRAVENOUS | Status: AC
  Administered 2011-09-18: 500 [IU] via INTRAVENOUS
  Filled 2011-09-18: qty 5

## 2011-09-18 MED ORDER — SODIUM CHLORIDE 0.9 % IJ SOLN
INTRAMUSCULAR | Status: AC
  Administered 2011-09-18: 10 mL via INTRAVENOUS
  Filled 2011-09-18: qty 10

## 2011-09-18 NOTE — Progress Notes (Signed)
Tolerated port flush well. 

## 2011-10-23 ENCOUNTER — Encounter (HOSPITAL_COMMUNITY): Payer: Medicare Other | Attending: Oncology | Admitting: Oncology

## 2011-10-23 VITALS — BP 139/70 | HR 62 | Temp 97.6°F | Ht 61.5 in | Wt 170.6 lb

## 2011-10-23 DIAGNOSIS — J4489 Other specified chronic obstructive pulmonary disease: Secondary | ICD-10-CM | POA: Insufficient documentation

## 2011-10-23 DIAGNOSIS — C50919 Malignant neoplasm of unspecified site of unspecified female breast: Secondary | ICD-10-CM

## 2011-10-23 DIAGNOSIS — C50419 Malignant neoplasm of upper-outer quadrant of unspecified female breast: Secondary | ICD-10-CM

## 2011-10-23 DIAGNOSIS — Z853 Personal history of malignant neoplasm of breast: Secondary | ICD-10-CM | POA: Insufficient documentation

## 2011-10-23 DIAGNOSIS — Z171 Estrogen receptor negative status [ER-]: Secondary | ICD-10-CM

## 2011-10-23 DIAGNOSIS — E119 Type 2 diabetes mellitus without complications: Secondary | ICD-10-CM | POA: Insufficient documentation

## 2011-10-23 DIAGNOSIS — J449 Chronic obstructive pulmonary disease, unspecified: Secondary | ICD-10-CM

## 2011-10-23 DIAGNOSIS — Z09 Encounter for follow-up examination after completed treatment for conditions other than malignant neoplasm: Secondary | ICD-10-CM | POA: Insufficient documentation

## 2011-10-23 DIAGNOSIS — E669 Obesity, unspecified: Secondary | ICD-10-CM | POA: Insufficient documentation

## 2011-10-23 LAB — COMPREHENSIVE METABOLIC PANEL
ALT: 14 U/L (ref 0–35)
AST: 16 U/L (ref 0–37)
Albumin: 3.6 g/dL (ref 3.5–5.2)
Alkaline Phosphatase: 72 U/L (ref 39–117)
BUN: 25 mg/dL — ABNORMAL HIGH (ref 6–23)
Chloride: 103 mEq/L (ref 96–112)
Potassium: 3.9 mEq/L (ref 3.5–5.1)
Sodium: 140 mEq/L (ref 135–145)
Total Bilirubin: 0.3 mg/dL (ref 0.3–1.2)

## 2011-10-23 LAB — DIFFERENTIAL
Basophils Relative: 1 % (ref 0–1)
Monocytes Relative: 7 % (ref 3–12)
Neutro Abs: 4.3 10*3/uL (ref 1.7–7.7)
Neutrophils Relative %: 66 % (ref 43–77)

## 2011-10-23 LAB — CBC
Hemoglobin: 10.8 g/dL — ABNORMAL LOW (ref 12.0–15.0)
MCHC: 33.8 g/dL (ref 30.0–36.0)
RBC: 3.66 MIL/uL — ABNORMAL LOW (ref 3.87–5.11)

## 2011-10-23 MED ORDER — SODIUM CHLORIDE 0.9 % IJ SOLN
INTRAMUSCULAR | Status: AC
  Administered 2011-10-23: 10 mL via INTRAVENOUS
  Filled 2011-10-23: qty 10

## 2011-10-23 MED ORDER — HEPARIN SOD (PORK) LOCK FLUSH 100 UNIT/ML IV SOLN
500.0000 [IU] | Freq: Once | INTRAVENOUS | Status: AC
  Administered 2011-10-23: 500 [IU] via INTRAVENOUS

## 2011-10-23 MED ORDER — HEPARIN SOD (PORK) LOCK FLUSH 100 UNIT/ML IV SOLN
INTRAVENOUS | Status: AC
  Administered 2011-10-23: 500 [IU] via INTRAVENOUS
  Filled 2011-10-23: qty 5

## 2011-10-23 MED ORDER — SODIUM CHLORIDE 0.9 % IJ SOLN
10.0000 mL | Freq: Once | INTRAMUSCULAR | Status: AC
  Administered 2011-10-23: 10 mL via INTRAVENOUS

## 2011-10-23 NOTE — Patient Instructions (Signed)
[facility]  Discharge Instructions  RECOMMENDATIONS MADE BY THE CONSULTANT AND ANY TEST RESULTS WILL BE SENT TO YOUR REFERRING DOCTOR.   EXAM FINDINGS BY MD TODAY AND SIGNS AND SYMPTOMS TO REPORT TO CLINIC OR PRIMARY MD: Exam good   Return in 6 weeks for port flush and 6 months to see the doctor.  I acknowledge that I have been informed and understand all the instructions given to me and received a copy. I do not have any more questions at this time, but understand that I may call the Specialty Clinic at [facility] at [phone] during business hours should I have any further questions or need assistance in obtaining follow-up care.    __________________________________________  _____________  __________ Signature of Patient or Authorized Representative            Date                   Time    __________________________________________ Nurse's Signature

## 2011-10-23 NOTE — Progress Notes (Signed)
Lyndel Safe presented for Portacath access and flush. Proper placement of portacath confirmed by CXR. Portacath located rt chest wall accessed with  H 20 needle. Good blood return present and labs drawn for cbc diff and cmet. Portacath flushed with 20ml NS and 500U/62ml Heparin and needle removed intact. Procedure without incident. Patient tolerated procedure well.

## 2011-10-23 NOTE — Progress Notes (Signed)
CC:   [name]. [name], MD Currie Paris, M.D. Lurline Hare, M.D.  DIAGNOSES: 1. Stage II (T2 N0) high-grade infiltrating ductal carcinoma of the     left breast, 2.4 cm in size with a negative sentinel node that was     ER/PR negative, but HER-2/neu positive.  Ki-67 marker was high at     80%.  She had 3 lymph nodes that were negative.  No lymphovascular     invasion was seen, but I treated her with carboplatin and Taxol and     then Herceptin, the latter for a total of 52 weeks.  She remains     disease free at this time. 2. Chronic obstructive pulmonary disease, now on oxygen, having quit     smoking, however, 20 years ago, but presented to Dr. [name] in     October 2012 with increasing shortness of breath. 3. Diabetes mellitus, under good control. 4. Mild obesity. Paige Rhodes is here today, still very independent, still basically living on her own.  She just turned 80.  It turns out that in October, however, she developed more shortness of breath and was found to have pretty significant hypoxia.  So she is now on oxygen basically around the clock.  She still looks good.  She feels good.  She has a negative oncologic review of systems.  Her port is still intact.  She is having it flushed on a regular basis.  PHYSICAL EXAMINATION:  Vital Signs:  Weight 170 pounds.  She is in no acute distress.  BMI is 31.8.  Blood pressure 139/70, respiratory rate is 22 and slightly shallow.  Pulse right around 60 to 62 and regular. She is afebrile.  Denies any pain.  Breasts:  The left breast still shows radiation therapy changes with a little thickening of the tissue. No obvious mass in either breast.  No nodes in any location.  Lungs: Show a few rales at the left base, which do not completely clear with coughing, but definitely partially clear.  The right lung is clear, but with diminished breath sounds throughout both lungs.  Heart:  Shows a regular rhythm and rate.  I did not hear murmur, rub,  or gallop.  Port is intact.  Abdomen:  Soft, obese, nontender without obvious organomegaly.  So she looks good.  We will see her back in 6 more months.  We will just get some baseline blood work today.    ______________________________ [name]. Mariel Sleet, MD ESN/MEDQ  D:  10/23/2011  T:  10/23/2011  Job:  [id number]

## 2011-10-23 NOTE — Progress Notes (Signed)
This office note has been dictated.

## 2011-10-23 NOTE — Progress Notes (Signed)
Addended by: Edythe Lynn A on: 10/23/2011 10:18 AM   Modules accepted: Orders

## 2011-10-30 ENCOUNTER — Encounter (HOSPITAL_COMMUNITY): Payer: Medicare Other

## 2011-12-04 ENCOUNTER — Encounter (HOSPITAL_COMMUNITY): Payer: Medicare Other | Attending: Oncology

## 2011-12-04 DIAGNOSIS — C50419 Malignant neoplasm of upper-outer quadrant of unspecified female breast: Secondary | ICD-10-CM

## 2011-12-04 DIAGNOSIS — Z452 Encounter for adjustment and management of vascular access device: Secondary | ICD-10-CM

## 2011-12-04 DIAGNOSIS — C50919 Malignant neoplasm of unspecified site of unspecified female breast: Secondary | ICD-10-CM | POA: Insufficient documentation

## 2011-12-04 MED ORDER — SODIUM CHLORIDE 0.9 % IJ SOLN
10.0000 mL | INTRAMUSCULAR | Status: DC | PRN
  Administered 2011-12-04: 10 mL via INTRAVENOUS
  Filled 2011-12-04: qty 10

## 2011-12-04 MED ORDER — HEPARIN SOD (PORK) LOCK FLUSH 100 UNIT/ML IV SOLN
500.0000 [IU] | Freq: Once | INTRAVENOUS | Status: AC
  Administered 2011-12-04: 500 [IU] via INTRAVENOUS
  Filled 2011-12-04: qty 5

## 2011-12-04 MED ORDER — HEPARIN SOD (PORK) LOCK FLUSH 100 UNIT/ML IV SOLN
INTRAVENOUS | Status: AC
  Filled 2011-12-04: qty 5

## 2011-12-04 NOTE — Progress Notes (Signed)
Paige Rhodes presented for Portacath access and flush. Proper placement of portacath confirmed by CXR. Portacath located left chest wall accessed with  H 20 needle. Good blood return present. Portacath flushed with 20ml NS and 500U/5ml Heparin and needle removed intact. Procedure without incident. Patient tolerated procedure well.   

## 2011-12-24 ENCOUNTER — Ambulatory Visit (INDEPENDENT_AMBULATORY_CARE_PROVIDER_SITE_OTHER): Payer: Medicare Other | Admitting: Surgery

## 2011-12-24 ENCOUNTER — Encounter (INDEPENDENT_AMBULATORY_CARE_PROVIDER_SITE_OTHER): Payer: Self-pay | Admitting: Surgery

## 2011-12-24 VITALS — BP 158/76 | HR 72 | Temp 97.6°F | Resp 20 | Ht 62.0 in | Wt 171.0 lb

## 2011-12-24 DIAGNOSIS — Z853 Personal history of malignant neoplasm of breast: Secondary | ICD-10-CM

## 2011-12-24 NOTE — Progress Notes (Signed)
NAME: Paige Rhodes       DOB: [birth year]           DATE: 12/24/2011       MRN: [id number]   Paige Rhodes is a 78 y.o.Marland Kitchenfemale who presents for routine followup of her Stage II left breast cancer diagnosed in 2011 and treated with lumpectomy, radiation and chemo including herceptin. She has no problems or concerns on either side.  PFSH: She has had no significant changes since the last visit here.  ROS: There have been no significant changes since the last visit here  EXAM: General: The patient is alert, oriented, generally healty appearing, NAD. Mood and affect are normal.  Breasts:  Right breast is normal. Left shows firmness and increased pigmentation from the radiation. It is not tender, it is smaller than the right. I believe it is the same as noted last year  Lymphatics: She has no axillary or supraclavicular adenopathy on either side.  Extremities: Full ROM of the surgical side with no lymphedema noted.  Data Reviewed: Mammogram last June was negative - du in two months  Impression: Doing well, with no evidence of recurrent cancer or new cancer  Plan: RTC PRN.

## 2011-12-24 NOTE — Patient Instructions (Signed)
We will see you again on an as needed basis. Please call the office at [phone] if you have any questions or concerns. Thank you for allowing us to take care of you.  

## 2012-01-15 ENCOUNTER — Encounter (HOSPITAL_COMMUNITY): Payer: Medicare Other | Attending: Oncology

## 2012-01-15 ENCOUNTER — Encounter (HOSPITAL_COMMUNITY): Payer: Self-pay

## 2012-01-15 DIAGNOSIS — E782 Mixed hyperlipidemia: Secondary | ICD-10-CM | POA: Insufficient documentation

## 2012-01-15 DIAGNOSIS — E039 Hypothyroidism, unspecified: Secondary | ICD-10-CM | POA: Insufficient documentation

## 2012-01-15 DIAGNOSIS — E119 Type 2 diabetes mellitus without complications: Secondary | ICD-10-CM | POA: Insufficient documentation

## 2012-01-15 DIAGNOSIS — E213 Hyperparathyroidism, unspecified: Secondary | ICD-10-CM | POA: Insufficient documentation

## 2012-01-15 DIAGNOSIS — J449 Chronic obstructive pulmonary disease, unspecified: Secondary | ICD-10-CM | POA: Insufficient documentation

## 2012-01-15 DIAGNOSIS — R809 Proteinuria, unspecified: Secondary | ICD-10-CM | POA: Insufficient documentation

## 2012-01-15 DIAGNOSIS — E785 Hyperlipidemia, unspecified: Secondary | ICD-10-CM | POA: Insufficient documentation

## 2012-01-15 DIAGNOSIS — M810 Age-related osteoporosis without current pathological fracture: Secondary | ICD-10-CM | POA: Insufficient documentation

## 2012-01-15 DIAGNOSIS — Z452 Encounter for adjustment and management of vascular access device: Secondary | ICD-10-CM

## 2012-01-15 DIAGNOSIS — C50419 Malignant neoplasm of upper-outer quadrant of unspecified female breast: Secondary | ICD-10-CM

## 2012-01-15 DIAGNOSIS — Z95828 Presence of other vascular implants and grafts: Secondary | ICD-10-CM | POA: Insufficient documentation

## 2012-01-15 DIAGNOSIS — M109 Gout, unspecified: Secondary | ICD-10-CM | POA: Insufficient documentation

## 2012-01-15 DIAGNOSIS — Z9889 Other specified postprocedural states: Secondary | ICD-10-CM | POA: Insufficient documentation

## 2012-01-15 DIAGNOSIS — I10 Essential (primary) hypertension: Secondary | ICD-10-CM | POA: Insufficient documentation

## 2012-01-15 HISTORY — DX: Presence of other vascular implants and grafts: Z95.828

## 2012-01-15 MED ORDER — SODIUM CHLORIDE 0.9 % IJ SOLN
INTRAMUSCULAR | Status: AC
  Filled 2012-01-15: qty 10

## 2012-01-15 MED ORDER — HEPARIN SOD (PORK) LOCK FLUSH 100 UNIT/ML IV SOLN
500.0000 [IU] | Freq: Once | INTRAVENOUS | Status: AC
  Administered 2012-01-15: 500 [IU] via INTRAVENOUS
  Filled 2012-01-15: qty 5

## 2012-01-15 MED ORDER — SODIUM CHLORIDE 0.9 % IJ SOLN
10.0000 mL | INTRAMUSCULAR | Status: DC | PRN
  Administered 2012-01-15: 10 mL via INTRAVENOUS
  Filled 2012-01-15: qty 10

## 2012-01-15 MED ORDER — HEPARIN SOD (PORK) LOCK FLUSH 100 UNIT/ML IV SOLN
INTRAVENOUS | Status: AC
  Filled 2012-01-15: qty 5

## 2012-01-15 NOTE — Progress Notes (Signed)
Tolerated port flush well.  Good blood return. 

## 2012-01-28 IMAGING — US US BIOPSY
1 series · 6 of 6 positions shown · non-contrast
Comparison: none

CLINICAL DATA: Suspicious mass at 1 o'clock 5 cm from the left
nipple.

[Series 1: us biopsy · 0.08mm/px · 6 of 6 slices shown]
[im 1/6]
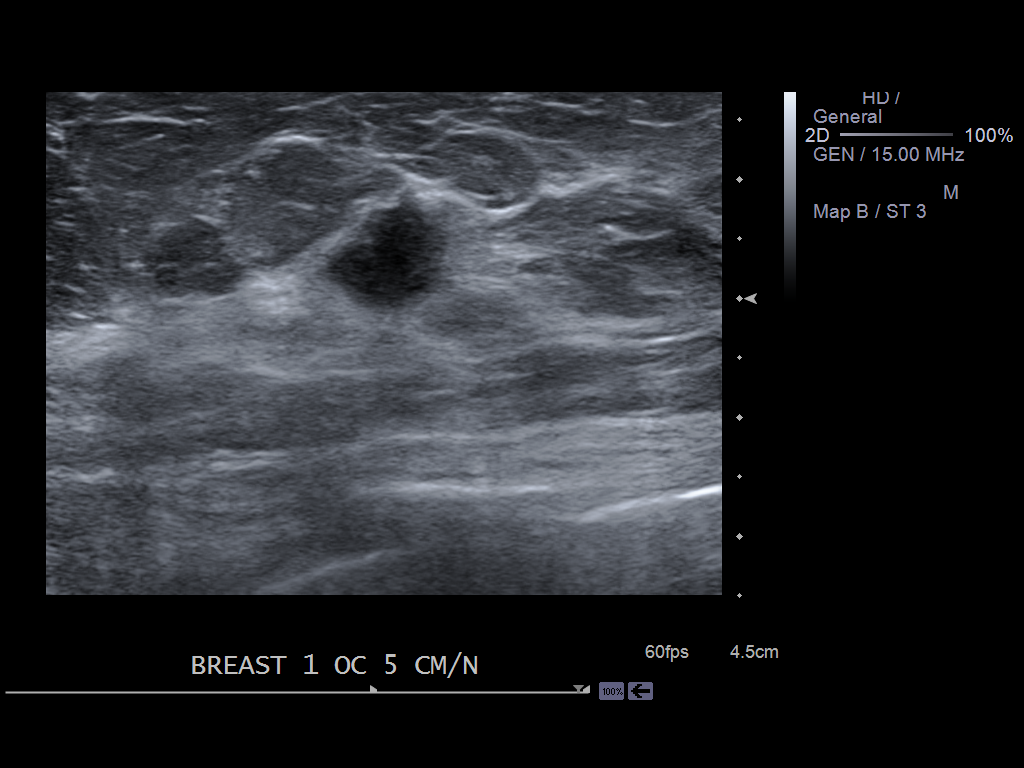
[im 2/6]
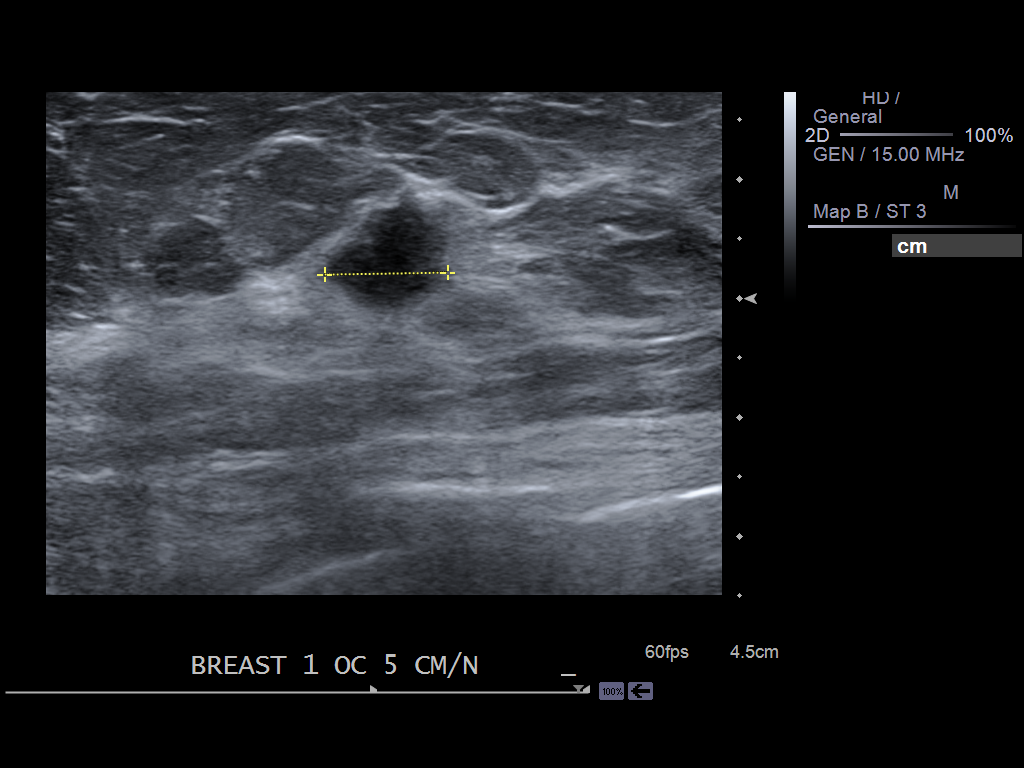
[im 3/6]
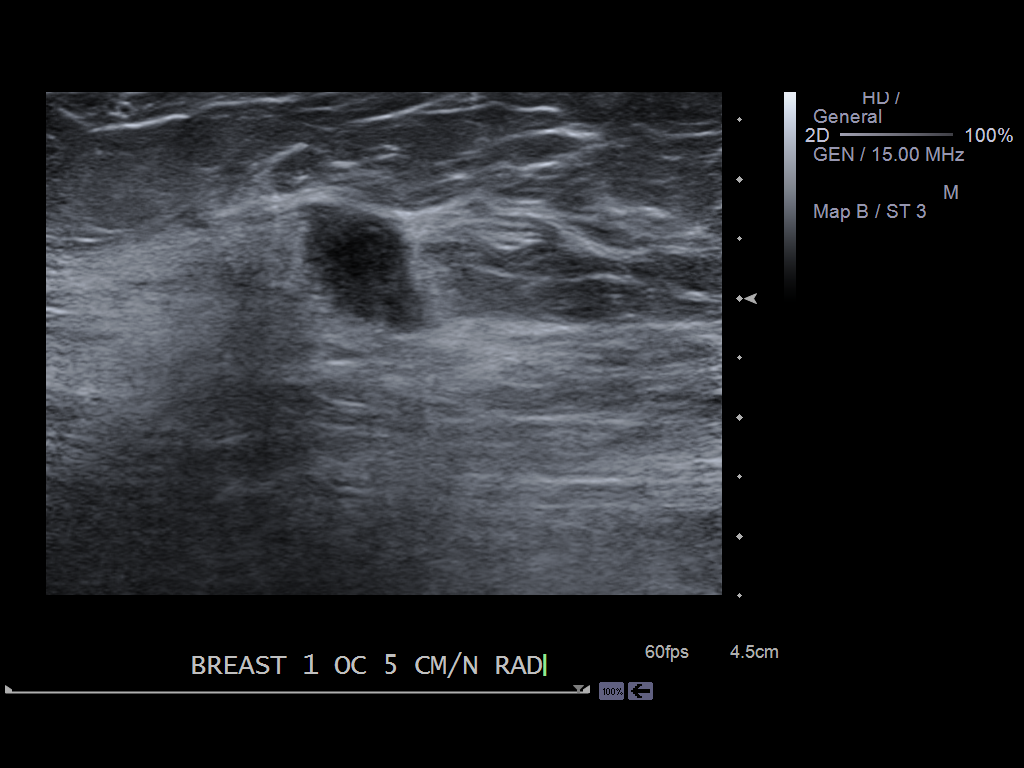
[im 4/6]
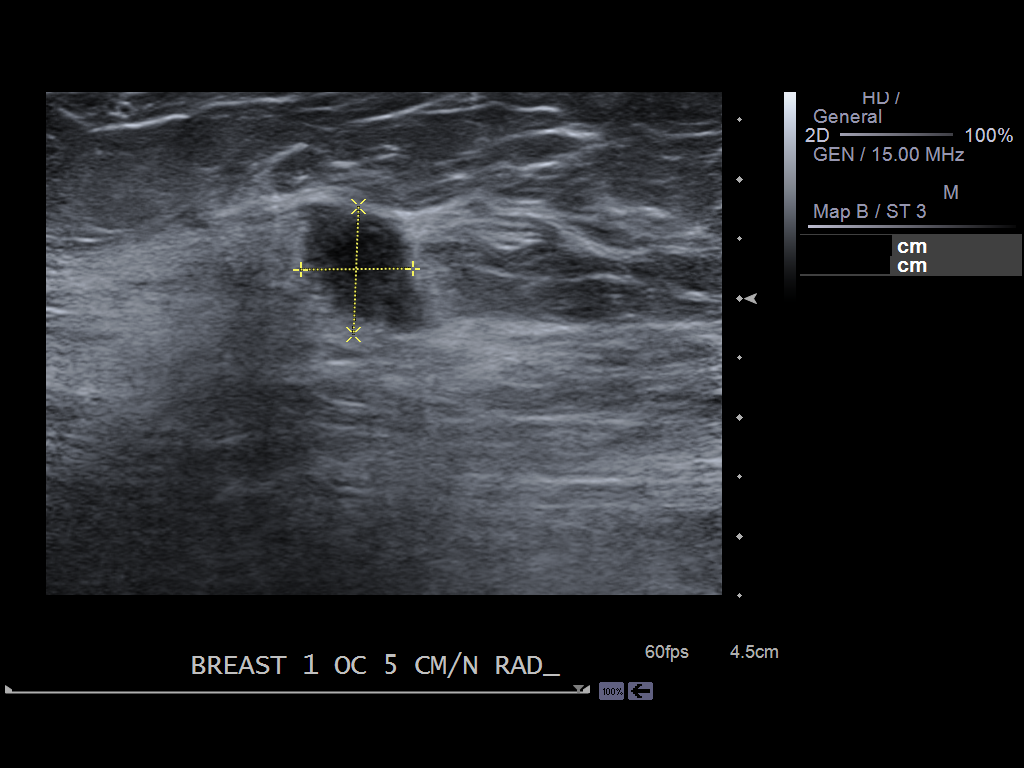
[im 5/6]
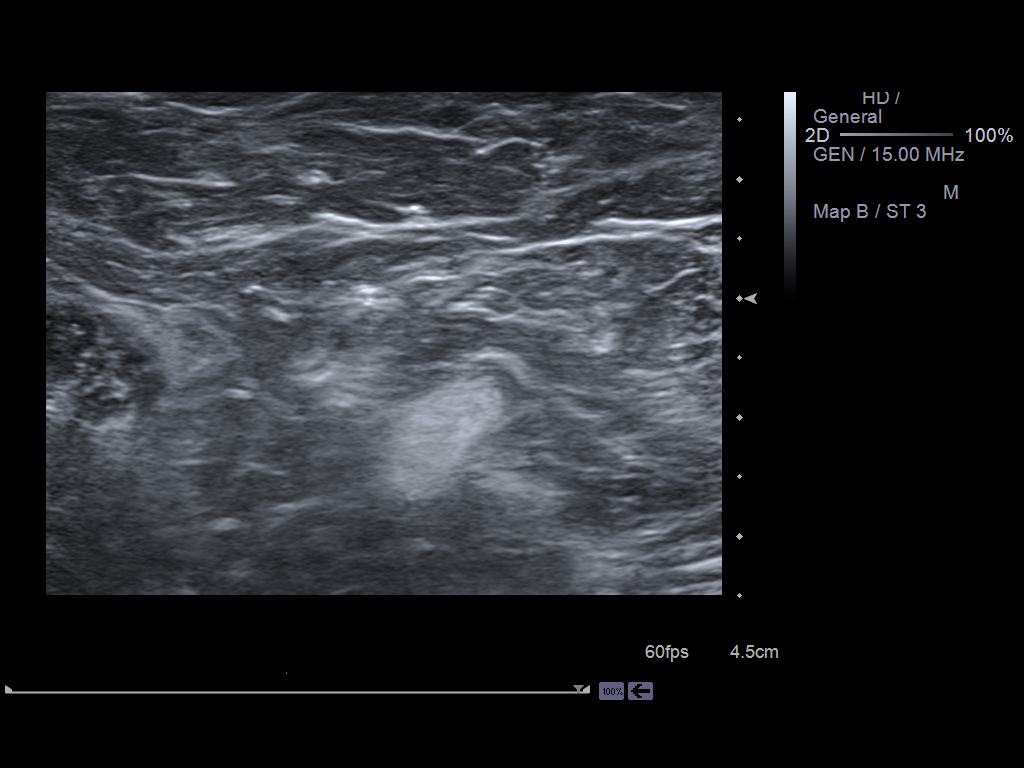
[im 6/6]
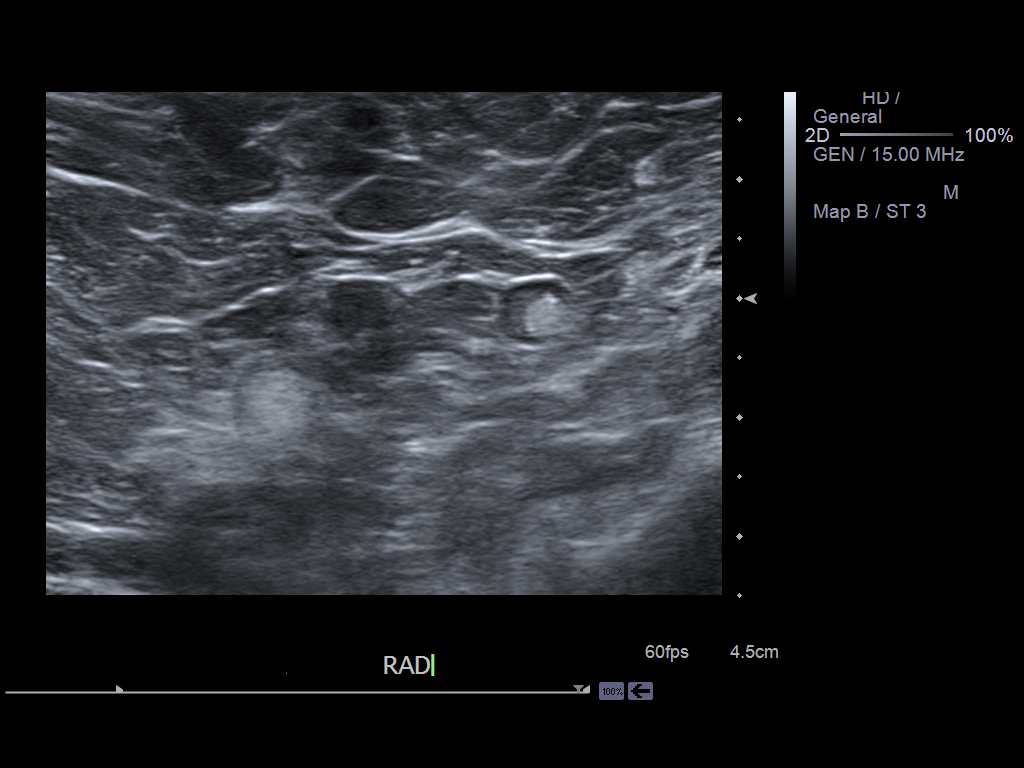

[6 of 6 positions shown; findings below may reference images not displayed]

ULTRASOUND GUIDED VACUUM ASSISTED CORE BIOPSY OF THE LEFT BREAST

The patient and I discussed discussed the procedure of ultrasound-
guided biopsy, including risks, benefits, and alternatives.
Specifically, we discussed the risks of infection, bleeding, tissue
injury, clip migration and inadequate sampling.  Informed written
consent was given.

Using sterile technique, 2% lidocaine, ultrasound guidance and a 12
gauge vacuum assisted needle, biopsy was performed of the mass at 1
o'clock 5 cm in the left nipple.  At the conclusion of the
procedure, a MammoMark tissue marker clip was deployed into the
biopsy cavity.  Follow-up 2-view mammogram was performed and
dictated separately.

Histologic evaluation demonstrates high-grade invasive ductal
carcinoma.  This is concordant with the imaging findings.  Ogundipe
Ndeye, RN, telephoned to results to Dr. Deybis.  The patient had an
appointment with in this morning.  Dr. Keenan staff will arrange
surgical consultation.  Breast MRI will be scheduled.
IMPRESSION: Ultrasound-guided biopsy of a mass at 1 o'clock 5 cm from the left
nipple.  High-grade invasive ductal carcinoma is diagnosed.  Breast
MRI will be scheduled.  Dr. Keenan staff will arrange surgical
consultation.  No apparent complications.

## 2012-01-28 IMAGING — MG MM DIGITAL DIAGNOSTIC UNILAT L
2 series · 2 of 2 positions shown · non-contrast
Comparison: 03/15/2009

CLINICAL DATA: The patient returns for evaluation of a possible
mass in the left breast noted on recent screening study dated
03/16/2010.

DIGITAL DIAGNOSTIC LEFT MAMMOGRAM  AND LEFT BREAST ULTRASOUND:

[L CC]
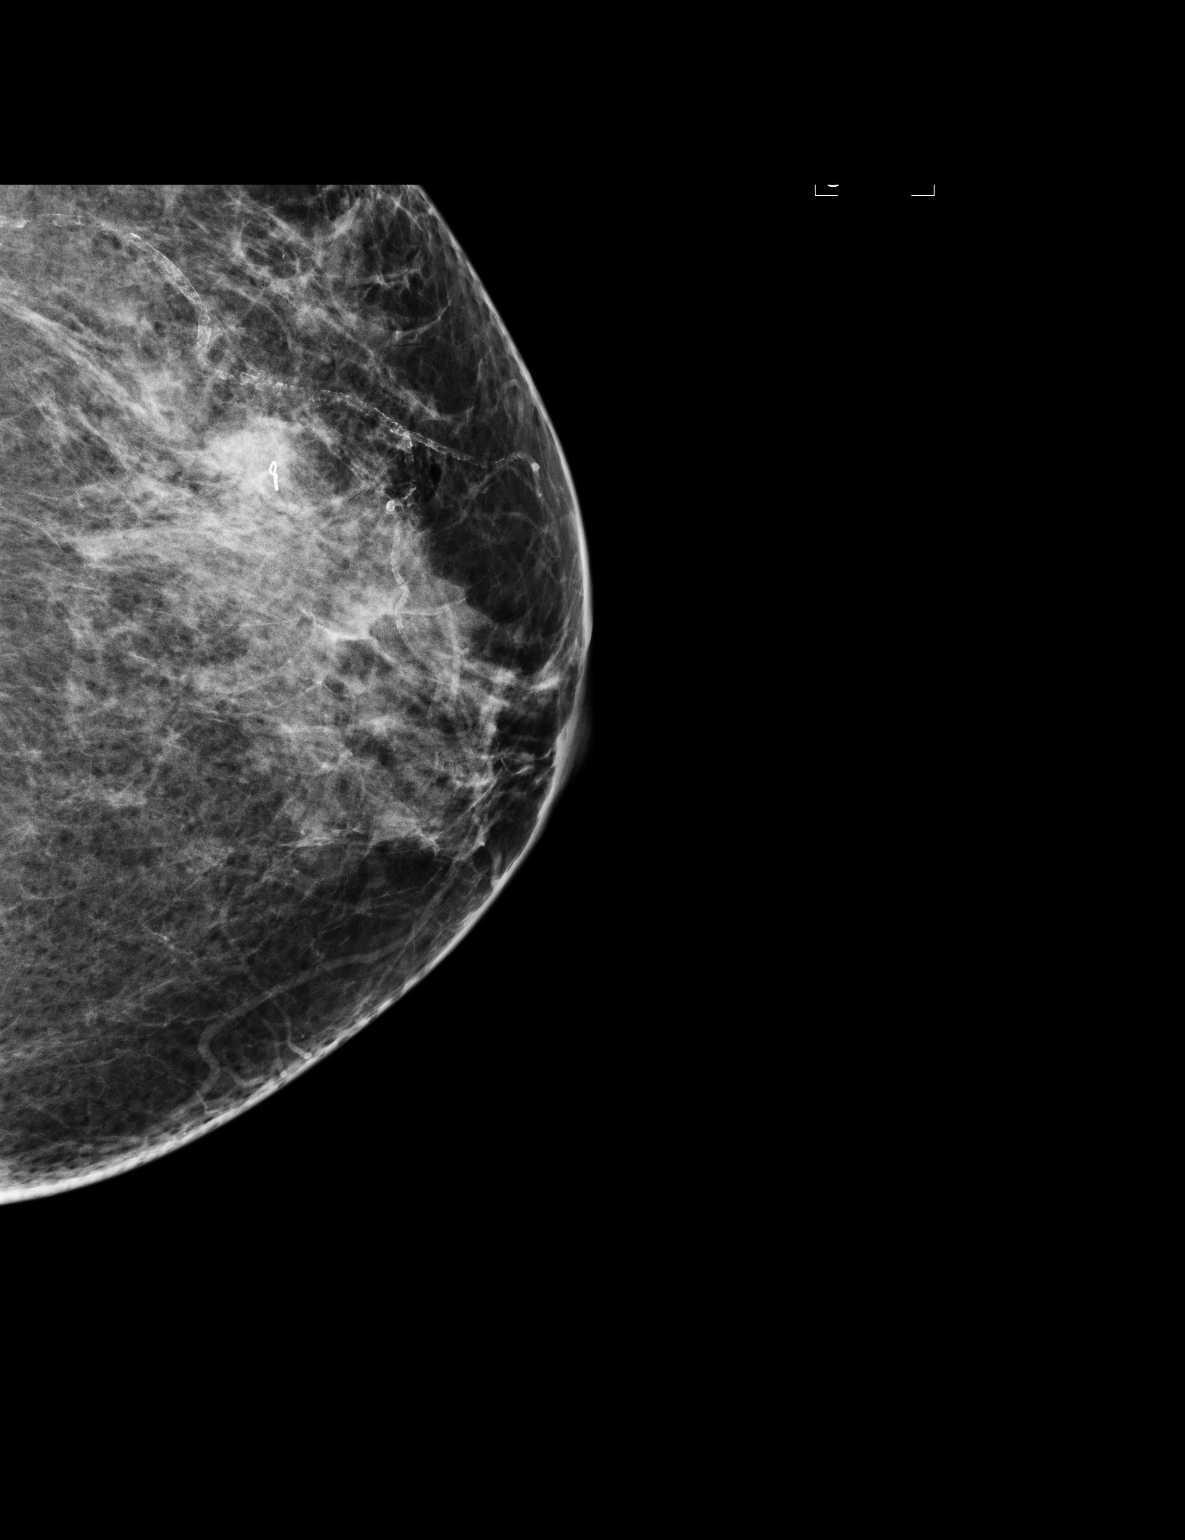

[L MLO]
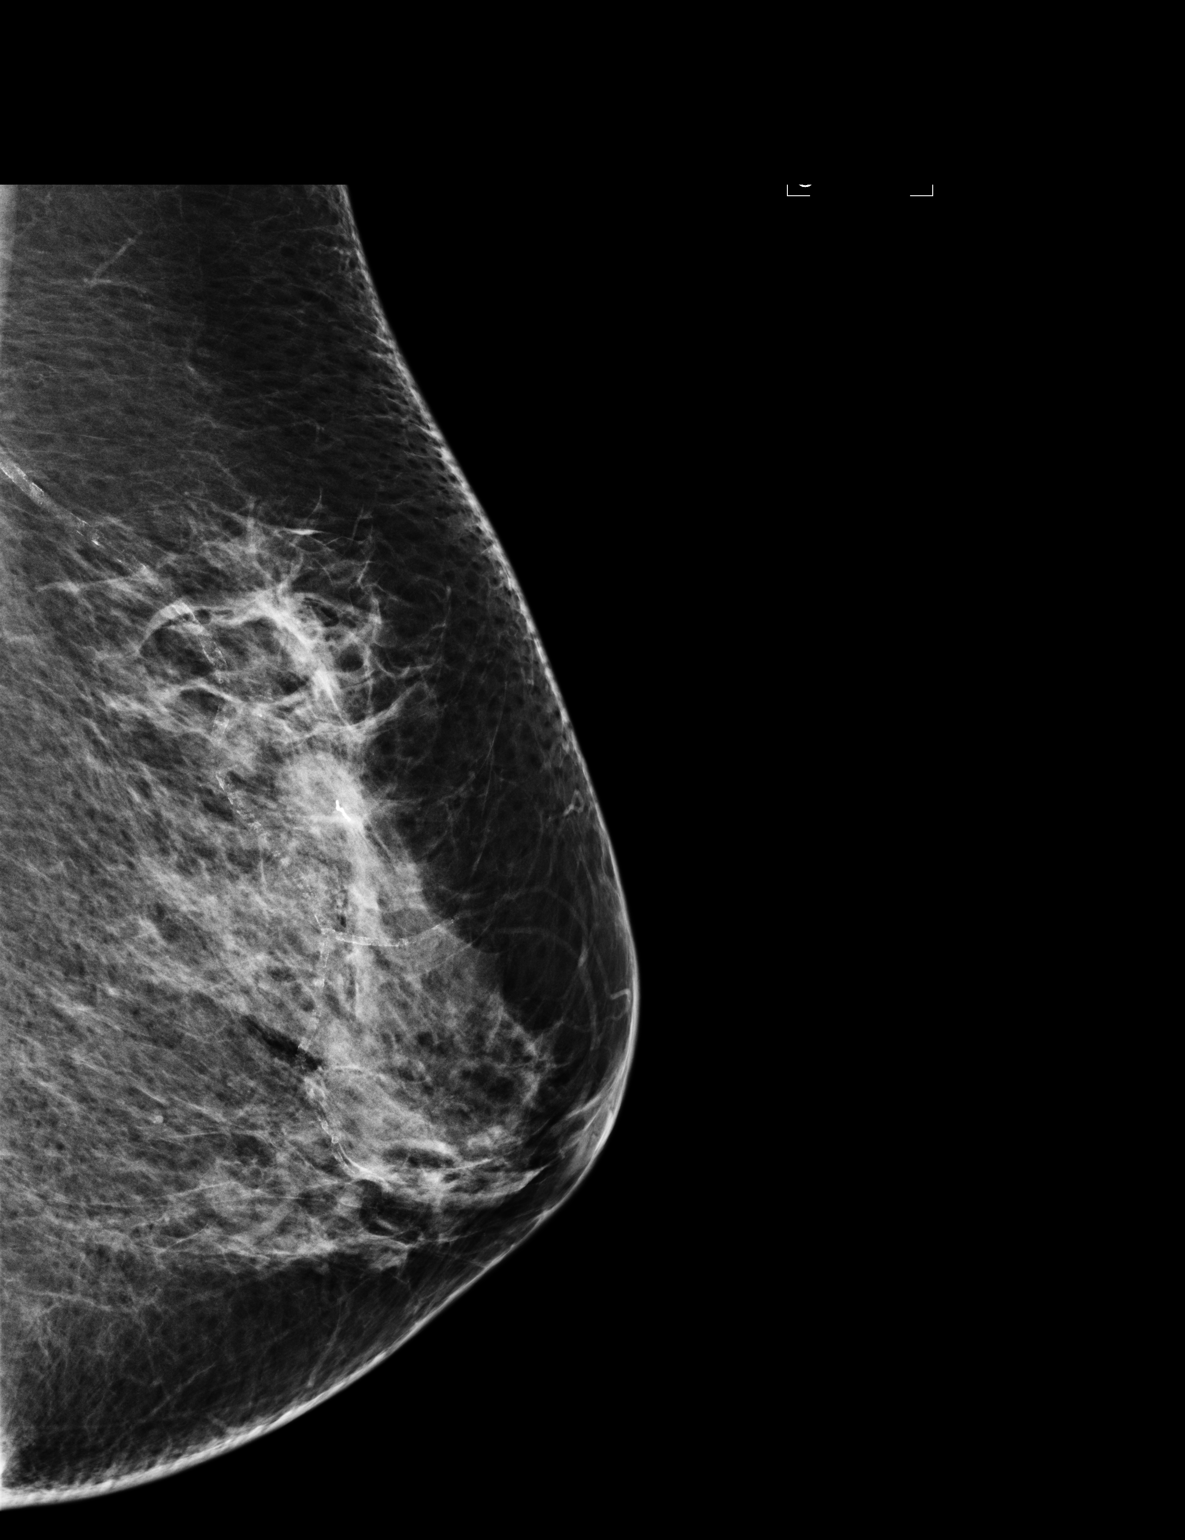

[2 of 2 positions shown; findings below may reference images not displayed]

FINDINGS: Additional views confirm the presence of an obscured
irregular mass in the left upper outer quadrant.

On physical exam, I palpate thickening in the 1 o'clock position of
the left breast, 5 cm from the left nipple.

Ultrasound is performed, showing a hypoechoic irregularly
marginated mass at 1 o'clock 5 cm from the left nipple measuring
0.9 x 1.1 x 1.0 cm.  Sonography of the left axilla shows no
abnormal axillary nodes.  The appearance is highly suspicious for
invasive mammary carcinoma.  Biopsy is recommended.

Ultrasound-guided core needle biopsy was discussed with the
patient.  She agreed with this plan.
IMPRESSION: Suspicious mass at 1 o'clock 5 cm in the left nipple as described
above.  The appearance is concerning for invasive mammary
carcinoma.  Biopsy is recommended.

Ultrasound-guided core needle biopsy will be performed and reported
separately.

Report could not be called as Dr. [REDACTED] was closed today.

BI-RADS CATEGORY 5:  Highly suggestive of malignancy - appropriate
action should be taken.

## 2012-02-01 DIAGNOSIS — M109 Gout, unspecified: Secondary | ICD-10-CM

## 2012-02-01 HISTORY — DX: Gout, unspecified: M10.9

## 2012-02-17 ENCOUNTER — Other Ambulatory Visit (HOSPITAL_COMMUNITY): Payer: Self-pay | Admitting: Oncology

## 2012-02-17 ENCOUNTER — Other Ambulatory Visit (HOSPITAL_COMMUNITY): Payer: Self-pay | Admitting: Internal Medicine

## 2012-02-17 DIAGNOSIS — Z139 Encounter for screening, unspecified: Secondary | ICD-10-CM

## 2012-02-26 ENCOUNTER — Encounter (HOSPITAL_COMMUNITY): Payer: Medicare Other | Attending: Oncology

## 2012-02-26 DIAGNOSIS — C50419 Malignant neoplasm of upper-outer quadrant of unspecified female breast: Secondary | ICD-10-CM

## 2012-02-26 DIAGNOSIS — Z452 Encounter for adjustment and management of vascular access device: Secondary | ICD-10-CM

## 2012-02-26 DIAGNOSIS — C50919 Malignant neoplasm of unspecified site of unspecified female breast: Secondary | ICD-10-CM

## 2012-02-26 MED ORDER — SODIUM CHLORIDE 0.9 % IJ SOLN
10.0000 mL | INTRAMUSCULAR | Status: DC | PRN
  Administered 2012-02-26: 10 mL via INTRAVENOUS
  Filled 2012-02-26: qty 10

## 2012-02-26 MED ORDER — HEPARIN SOD (PORK) LOCK FLUSH 100 UNIT/ML IV SOLN
INTRAVENOUS | Status: AC
  Filled 2012-02-26: qty 5

## 2012-02-26 MED ORDER — HEPARIN SOD (PORK) LOCK FLUSH 100 UNIT/ML IV SOLN
500.0000 [IU] | Freq: Once | INTRAVENOUS | Status: AC
  Administered 2012-02-26: 500 [IU] via INTRAVENOUS
  Filled 2012-02-26: qty 5

## 2012-02-26 MED ORDER — SODIUM CHLORIDE 0.9 % IJ SOLN
INTRAMUSCULAR | Status: AC
  Filled 2012-02-26: qty 10

## 2012-02-26 NOTE — Progress Notes (Signed)
Paige Rhodes presented for Portacath access and flush. Proper placement of portacath confirmed by CXR. Portacath located right chest wall accessed with  H 20 needle. Good blood return present. Portacath flushed with 20ml NS and 500U/5ml Heparin and needle removed intact. Procedure without incident. Patient tolerated procedure well.   

## 2012-03-25 ENCOUNTER — Encounter (HOSPITAL_COMMUNITY): Payer: Medicare Other

## 2012-03-25 ENCOUNTER — Ambulatory Visit (HOSPITAL_COMMUNITY)
Admission: RE | Admit: 2012-03-25 | Discharge: 2012-03-25 | Disposition: A | Discharge status: Home / Self Care | Payer: Medicare Other | Source: Ambulatory Visit | Attending: Internal Medicine | Admitting: Internal Medicine

## 2012-03-25 ENCOUNTER — Ambulatory Visit (HOSPITAL_COMMUNITY): Payer: Medicare Other

## 2012-03-25 DIAGNOSIS — Z9889 Other specified postprocedural states: Secondary | ICD-10-CM | POA: Insufficient documentation

## 2012-03-25 DIAGNOSIS — Z853 Personal history of malignant neoplasm of breast: Secondary | ICD-10-CM | POA: Insufficient documentation

## 2012-03-25 DIAGNOSIS — Z139 Encounter for screening, unspecified: Secondary | ICD-10-CM

## 2012-04-08 ENCOUNTER — Encounter (HOSPITAL_COMMUNITY): Payer: Medicare Other | Attending: Oncology

## 2012-04-08 DIAGNOSIS — C50919 Malignant neoplasm of unspecified site of unspecified female breast: Secondary | ICD-10-CM

## 2012-04-08 DIAGNOSIS — C50419 Malignant neoplasm of upper-outer quadrant of unspecified female breast: Secondary | ICD-10-CM

## 2012-04-08 LAB — COMPREHENSIVE METABOLIC PANEL
BUN: 19 mg/dL (ref 6–23)
Calcium: 9.8 mg/dL (ref 8.4–10.5)
Creatinine, Ser: 0.75 mg/dL (ref 0.50–1.10)
GFR calc Af Amer: >90 mL/min (ref >90)
GFR calc non Af Amer: 79 mL/min — ABNORMAL LOW (ref >90)
Glucose, Bld: 152 mg/dL — ABNORMAL HIGH (ref 70–99)
Sodium: 139 mEq/L (ref 135–145)
Total Protein: 7.6 g/dL (ref 6.0–8.3)

## 2012-04-08 LAB — DIFFERENTIAL
Basophils Absolute: 0 10*3/uL (ref 0.0–0.1)
Lymphocytes Relative: 35 % (ref 12–46)
Lymphs Abs: 2 10*3/uL (ref 0.7–4.0)
Neutro Abs: 3.1 10*3/uL (ref 1.7–7.7)
Neutrophils Relative %: 54 % (ref 43–77)

## 2012-04-08 LAB — CBC
HCT: 32.1 % — ABNORMAL LOW (ref 36.0–46.0)
MCV: 88.2 fL (ref 78.0–100.0)
RBC: 3.64 MIL/uL — ABNORMAL LOW (ref 3.87–5.11)
WBC: 5.8 10*3/uL (ref 4.0–10.5)

## 2012-04-08 MED ORDER — HEPARIN SOD (PORK) LOCK FLUSH 100 UNIT/ML IV SOLN
500.0000 [IU] | Freq: Once | INTRAVENOUS | Status: AC
  Administered 2012-04-08: 500 [IU] via INTRAVENOUS
  Filled 2012-04-08: qty 5

## 2012-04-08 MED ORDER — SODIUM CHLORIDE 0.9 % IJ SOLN
INTRAMUSCULAR | Status: AC
  Filled 2012-04-08: qty 10

## 2012-04-08 MED ORDER — SODIUM CHLORIDE 0.9 % IJ SOLN
10.0000 mL | INTRAMUSCULAR | Status: DC | PRN
  Administered 2012-04-08: 10 mL via INTRAVENOUS
  Filled 2012-04-08: qty 10

## 2012-04-08 MED ORDER — HEPARIN SOD (PORK) LOCK FLUSH 100 UNIT/ML IV SOLN
INTRAVENOUS | Status: AC
  Filled 2012-04-08: qty 5

## 2012-04-08 NOTE — Progress Notes (Signed)
Mikki F Wasko presented for Portacath access and flush. Proper placement of portacath confirmed by CXR. Portacath located rt chest wall accessed with  H 20 needle. Good blood return present. Portacath flushed with 20ml NS and 500U/5ml Heparin and needle removed intact. Procedure without incident. Patient tolerated procedure well.   

## 2012-04-21 ENCOUNTER — Encounter (HOSPITAL_COMMUNITY): Payer: Self-pay | Admitting: Oncology

## 2012-04-21 ENCOUNTER — Encounter (HOSPITAL_BASED_OUTPATIENT_CLINIC_OR_DEPARTMENT_OTHER): Payer: Medicare Other | Admitting: Oncology

## 2012-04-21 VITALS — BP 133/69 | HR 57 | Temp 97.5°F | Resp 22 | Ht 62.0 in | Wt 173.8 lb

## 2012-04-21 DIAGNOSIS — C50419 Malignant neoplasm of upper-outer quadrant of unspecified female breast: Secondary | ICD-10-CM

## 2012-04-21 DIAGNOSIS — J449 Chronic obstructive pulmonary disease, unspecified: Secondary | ICD-10-CM

## 2012-04-21 DIAGNOSIS — E119 Type 2 diabetes mellitus without complications: Secondary | ICD-10-CM

## 2012-04-21 DIAGNOSIS — Z170 Estrogen receptor positive status [ER+]: Secondary | ICD-10-CM

## 2012-04-21 DIAGNOSIS — C50919 Malignant neoplasm of unspecified site of unspecified female breast: Secondary | ICD-10-CM

## 2012-04-21 NOTE — Progress Notes (Signed)
Problem #1 stage II (T2 N0) high-grade infiltrating ductal carcinoma left breast 2.4 cm with a negative sentinel node ER/PR negative but HER-2/neu over amplified. Ki-67 marker high at 80% 3 lymph nodes were found all negative no LV I was seen but she was treated with carboplatin and Taxol and then Herceptin the latter for total 52 weeks. The 3 drugs were started concomitantly. Thus far she remains disease free.  Problem #2 COPD on oxygen but not using it around the clock I wonder if she needs at that time.  Problem #3 diabetes mellitus under good control  Problem #4 mild obesity weighing 173 pounds a 5 foot 2 inch frame.  Cell his only complaint today is that of memory issues and a recent episode of gout that with sometime in June though she has tremendous difficulty remembering when it took place. She was treated with indomethacin she states but could only remember the first 3 letters of the name of the drug. She could not remember having episodes she has had in her life. She is not on allopurinol. She also has some left knee discomfort but I cannot detect fluid in the left knee nor crepitations presently. She thinks that she has had the pain left knee for about 5-6 weeks. She is not hold anything about it before today. She did not fall or injury that she is aware of. She couldn't remember the president in the [place] could not remember the vice president did not know our [place] centers from [place] but could do serial sevens did know the date day of the week can do simple computations. She states her heart is issue is remembering names of people.  She was not cyanotic vital signs are stable lymph nodes are negative throughout left breast is negative for masses but very thickened and slightly distorted from the radiation and surgery. The right breast is negative. She has no adenopathy. Her lungs are clear but with diminished breath sounds throughout. Her heart shows a regular rhythm and rate  without obvious murmur rub or gallop. Her abdomen is soft nontender without organomegaly she has no peripheral leg edema or arm edema.  We'll see her back in 6 months and if her memory is not any better I think we may need to do some basic studies. She may also need to use her oxygen around the clock potentially

## 2012-04-21 NOTE — Patient Instructions (Addendum)
Paige Rhodes  DOB 10/17/1931 CSN [id number]  MRN [id number] Dr. Glenford Peers   [facility]  Discharge Instructions  RECOMMENDATIONS MADE BY THE CONSULTANT AND ANY TEST RESULTS WILL BE SENT TO YOUR REFERRING DOCTOR.   EXAM FINDINGS BY MD TODAY AND SIGNS AND SYMPTOMS TO REPORT TO CLINIC OR PRIMARY MD: Exam and discussion per MD.  You did well with the memory testing.  No evidence of recurrence by exam.  Report any new lumps, bone pain or worsening shortness of breath.  MEDICATIONS PRESCRIBED: none     SPECIAL INSTRUCTIONS/FOLLOW-UP: Lab work Needed in 6 months and Return to Clinic in 6 months.   I acknowledge that I have been informed and understand all the instructions given to me and received a copy. I do not have any more questions at this time, but understand that I may call the Specialty Clinic at [facility] at [phone] during business hours should I have any further questions or need assistance in obtaining follow-up care.    __________________________________________  _____________  __________ Signature of Patient or Authorized Representative            Date                   Time    __________________________________________ Nurse's Signature

## 2012-04-22 ENCOUNTER — Ambulatory Visit (HOSPITAL_COMMUNITY): Payer: Medicare Other | Admitting: Oncology

## 2012-05-20 ENCOUNTER — Encounter (HOSPITAL_COMMUNITY): Payer: Medicare Other | Attending: Oncology

## 2012-05-20 DIAGNOSIS — C50919 Malignant neoplasm of unspecified site of unspecified female breast: Secondary | ICD-10-CM | POA: Insufficient documentation

## 2012-05-20 DIAGNOSIS — Z452 Encounter for adjustment and management of vascular access device: Secondary | ICD-10-CM

## 2012-05-20 DIAGNOSIS — C50419 Malignant neoplasm of upper-outer quadrant of unspecified female breast: Secondary | ICD-10-CM

## 2012-05-20 MED ORDER — SODIUM CHLORIDE 0.9 % IJ SOLN
INTRAMUSCULAR | Status: AC
  Filled 2012-05-20: qty 10

## 2012-05-20 MED ORDER — SODIUM CHLORIDE 0.9 % IJ SOLN
10.0000 mL | INTRAMUSCULAR | Status: DC | PRN
  Administered 2012-05-20: 10 mL via INTRAVENOUS
  Filled 2012-05-20: qty 10

## 2012-05-20 MED ORDER — HEPARIN SOD (PORK) LOCK FLUSH 100 UNIT/ML IV SOLN
INTRAVENOUS | Status: AC
  Filled 2012-05-20: qty 5

## 2012-05-20 MED ORDER — HEPARIN SOD (PORK) LOCK FLUSH 100 UNIT/ML IV SOLN
500.0000 [IU] | Freq: Once | INTRAVENOUS | Status: AC
  Administered 2012-05-20: 500 [IU] via INTRAVENOUS
  Filled 2012-05-20: qty 5

## 2012-05-20 NOTE — Progress Notes (Signed)
Paige Rhodes presented for Portacath access and flush. Proper placement of portacath confirmed by CXR. Portacath located right chest wall accessed with  H 20 needle. Good blood return present. Portacath flushed with 20ml NS and 500U/5ml Heparin and needle removed intact. Procedure without incident. Patient tolerated procedure well.   

## 2012-06-02 ENCOUNTER — Encounter (HOSPITAL_COMMUNITY): Payer: Medicare Other

## 2012-07-01 ENCOUNTER — Encounter (HOSPITAL_COMMUNITY): Payer: Medicare Other | Attending: Oncology

## 2012-07-01 DIAGNOSIS — C50919 Malignant neoplasm of unspecified site of unspecified female breast: Secondary | ICD-10-CM

## 2012-07-01 DIAGNOSIS — C50419 Malignant neoplasm of upper-outer quadrant of unspecified female breast: Secondary | ICD-10-CM

## 2012-07-01 DIAGNOSIS — Z452 Encounter for adjustment and management of vascular access device: Secondary | ICD-10-CM

## 2012-07-01 MED ORDER — SODIUM CHLORIDE 0.9 % IJ SOLN
INTRAMUSCULAR | Status: AC
  Filled 2012-07-01: qty 10

## 2012-07-01 MED ORDER — HEPARIN SOD (PORK) LOCK FLUSH 100 UNIT/ML IV SOLN
INTRAVENOUS | Status: AC
  Filled 2012-07-01: qty 5

## 2012-07-01 MED ORDER — HEPARIN SOD (PORK) LOCK FLUSH 100 UNIT/ML IV SOLN
500.0000 [IU] | Freq: Once | INTRAVENOUS | Status: AC
  Administered 2012-07-01: 500 [IU] via INTRAVENOUS
  Filled 2012-07-01: qty 5

## 2012-07-01 MED ORDER — SODIUM CHLORIDE 0.9 % IJ SOLN
10.0000 mL | INTRAMUSCULAR | Status: DC | PRN
  Administered 2012-07-01: 10 mL via INTRAVENOUS
  Filled 2012-07-01: qty 10

## 2012-07-01 NOTE — Progress Notes (Signed)
Tolerated port flush well.  Good blood return. 

## 2012-08-12 ENCOUNTER — Encounter (HOSPITAL_COMMUNITY): Payer: Medicare Other | Attending: Oncology

## 2012-08-12 DIAGNOSIS — Z95828 Presence of other vascular implants and grafts: Secondary | ICD-10-CM

## 2012-08-12 DIAGNOSIS — C50919 Malignant neoplasm of unspecified site of unspecified female breast: Secondary | ICD-10-CM | POA: Insufficient documentation

## 2012-08-12 DIAGNOSIS — Z452 Encounter for adjustment and management of vascular access device: Secondary | ICD-10-CM

## 2012-08-12 DIAGNOSIS — Z9889 Other specified postprocedural states: Secondary | ICD-10-CM | POA: Insufficient documentation

## 2012-08-12 DIAGNOSIS — C50419 Malignant neoplasm of upper-outer quadrant of unspecified female breast: Secondary | ICD-10-CM

## 2012-08-12 MED ORDER — HEPARIN SOD (PORK) LOCK FLUSH 100 UNIT/ML IV SOLN
INTRAVENOUS | Status: AC
  Filled 2012-08-12: qty 5

## 2012-08-12 MED ORDER — HEPARIN SOD (PORK) LOCK FLUSH 100 UNIT/ML IV SOLN
500.0000 [IU] | Freq: Once | INTRAVENOUS | Status: AC
  Administered 2012-08-12: 500 [IU] via INTRAVENOUS
  Filled 2012-08-12: qty 5

## 2012-08-12 MED ORDER — SODIUM CHLORIDE 0.9 % IJ SOLN
20.0000 mL | INTRAMUSCULAR | Status: DC | PRN
  Administered 2012-08-12: 20 mL via INTRAVENOUS
  Filled 2012-08-12: qty 20

## 2012-08-12 MED ORDER — SODIUM CHLORIDE 0.9 % IJ SOLN
INTRAMUSCULAR | Status: AC
  Filled 2012-08-12: qty 10

## 2012-08-31 ENCOUNTER — Encounter (HOSPITAL_COMMUNITY): Payer: Self-pay | Admitting: Pharmacy Technician

## 2012-09-01 ENCOUNTER — Encounter (HOSPITAL_COMMUNITY)
Admission: RE | Admit: 2012-09-01 | Discharge: 2012-09-01 | Disposition: A | Discharge status: Home / Self Care | Payer: Medicare Other | Source: Ambulatory Visit | Attending: Ophthalmology | Admitting: Ophthalmology

## 2012-09-01 ENCOUNTER — Encounter (HOSPITAL_COMMUNITY): Payer: Self-pay

## 2012-09-01 HISTORY — DX: Hypothyroidism, unspecified: E03.9

## 2012-09-01 HISTORY — DX: Unspecified glaucoma: H40.9

## 2012-09-01 LAB — BASIC METABOLIC PANEL
CO2: 31 mEq/L (ref 19–32)
Chloride: 98 mEq/L (ref 96–112)
Creatinine, Ser: 0.88 mg/dL (ref 0.50–1.10)
Potassium: 4.4 mEq/L (ref 3.5–5.1)
Sodium: 137 mEq/L (ref 135–145)

## 2012-09-01 LAB — HEMOGLOBIN AND HEMATOCRIT, BLOOD
HCT: 33 % — ABNORMAL LOW (ref 36.0–46.0)
Hemoglobin: 11.2 g/dL — ABNORMAL LOW (ref 12.0–15.0)

## 2012-09-01 MED ORDER — LIDOCAINE HCL 3.5 % OP GEL
OPHTHALMIC | Status: AC
  Filled 2012-09-01: qty 5

## 2012-09-01 MED ORDER — NEOMYCIN-POLYMYXIN-DEXAMETH 3.5-10000-0.1 OP OINT
TOPICAL_OINTMENT | OPHTHALMIC | Status: AC
  Filled 2012-09-01: qty 3.5

## 2012-09-01 MED ORDER — LIDOCAINE HCL (PF) 1 % IJ SOLN
INTRAMUSCULAR | Status: AC
  Filled 2012-09-01: qty 2

## 2012-09-01 MED ORDER — TETRACAINE HCL 0.5 % OP SOLN
OPHTHALMIC | Status: AC
  Filled 2012-09-01: qty 2

## 2012-09-01 MED ORDER — PHENYLEPHRINE HCL 2.5 % OP SOLN
OPHTHALMIC | Status: AC
  Filled 2012-09-01: qty 2

## 2012-09-01 MED ORDER — CYCLOPENTOLATE-PHENYLEPHRINE 0.2-1 % OP SOLN
OPHTHALMIC | Status: AC
  Filled 2012-09-01: qty 2

## 2012-09-01 NOTE — Patient Instructions (Addendum)
Your procedure is scheduled on: 09/03/2012 Report to [facility] at   100    PM.  Call this number if you have problems the morning of surgery: [phone]   Do not eat food or drink liquids :After Midnight.      Take these medicines the morning of surgery with A SIP OF WATER:norvasc,coreg,synthroid,cozaar. Take albuterol before you come.   Do not wear jewelry, make-up or nail polish.  Do not wear lotions, powders, or perfumes.  Do not shave 48 hours prior to surgery.  Do not bring valuables to the hospital.  Contacts, dentures or bridgework may not be worn into surgery.  Leave suitcase in the car. After surgery it may be brought to your room.  For patients admitted to the hospital, checkout time is 11:00 AM the day of discharge.   Patients discharged the day of surgery will not be allowed to drive home.  :     Please read over the following fact sheets that you were given: Coughing and Deep Breathing, Surgical Site Infection Prevention, Anesthesia Post-op Instructions and Care and Recovery After Surgery    Cataract A cataract is a clouding of the lens of the eye. When a lens becomes cloudy, vision is reduced based on the degree and nature of the clouding. Many cataracts reduce vision to some degree. Some cataracts make people more near-sighted as they develop. Other cataracts increase glare. Cataracts that are ignored and become worse can sometimes look white. The white color can be seen through the pupil. CAUSES   Aging. However, cataracts may occur at any age, even in newborns.   Certain drugs.   Trauma to the eye.   Certain diseases such as diabetes.   Specific eye diseases such as chronic inflammation inside the eye or a sudden attack of a rare form of glaucoma.   Inherited or acquired medical problems.  SYMPTOMS   Gradual, progressive drop in vision in the affected eye.   Severe, rapid visual loss. This most often happens when trauma is the cause.  DIAGNOSIS  To detect a  cataract, an eye doctor examines the lens. Cataracts are best diagnosed with an exam of the eyes with the pupils enlarged (dilated) by drops.  TREATMENT  For an early cataract, vision may improve by using different eyeglasses or stronger lighting. If that does not help your vision, surgery is the only effective treatment. A cataract needs to be surgically removed when vision loss interferes with your everyday activities, such as driving, reading, or watching TV. A cataract may also have to be removed if it prevents examination or treatment of another eye problem. Surgery removes the cloudy lens and usually replaces it with a substitute lens (intraocular lens, IOL).  At a time when both you and your doctor agree, the cataract will be surgically removed. If you have cataracts in both eyes, only one is usually removed at a time. This allows the operated eye to heal and be out of danger from any possible problems after surgery (such as infection or poor wound healing). In rare cases, a cataract may be doing damage to your eye. In these cases, your caregiver may advise surgical removal right away. The vast majority of people who have cataract surgery have better vision afterward. HOME CARE INSTRUCTIONS  If you are not planning surgery, you may be asked to do the following:  Use different eyeglasses.   Use stronger or brighter lighting.   Ask your eye doctor about reducing your medicine  dose or changing medicines if it is thought that a medicine caused your cataract. Changing medicines does not make the cataract go away on its own.   Become familiar with your surroundings. Poor vision can lead to injury. Avoid bumping into things on the affected side. You are at a higher risk for tripping or falling.   Exercise extreme care when driving or operating machinery.   Wear sunglasses if you are sensitive to bright light or experiencing problems with glare.  SEEK IMMEDIATE MEDICAL CARE IF:   You have a  worsening or sudden vision loss.   You notice redness, swelling, or increasing pain in the eye.   You have a fever.  Document Released: 08/19/2005 Document Revised: 08/08/2011 Document Reviewed: 04/12/2011 [facility] Patient Information 2012 [place], [place].PATIENT INSTRUCTIONS POST-ANESTHESIA  IMMEDIATELY FOLLOWING SURGERY:  Do not drive or operate machinery for the first twenty four hours after surgery.  Do not make any important decisions for twenty four hours after surgery or while taking narcotic pain medications or sedatives.  If you develop intractable nausea and vomiting or a severe headache please notify your doctor immediately.  FOLLOW-UP:  Please make an appointment with your surgeon as instructed. You do not need to follow up with anesthesia unless specifically instructed to do so.  WOUND CARE INSTRUCTIONS (if applicable):  Keep a dry clean dressing on the anesthesia/puncture wound site if there is drainage.  Once the wound has quit draining you may leave it open to air.  Generally you should leave the bandage intact for twenty four hours unless there is drainage.  If the epidural site drains for more than 36-48 hours please call the anesthesia department.  QUESTIONS?:  Please feel free to call your physician or the hospital operator if you have any questions, and they will be happy to assist you.

## 2012-09-03 ENCOUNTER — Encounter (HOSPITAL_COMMUNITY): Payer: Self-pay | Admitting: *Deleted

## 2012-09-03 ENCOUNTER — Encounter (HOSPITAL_COMMUNITY): Payer: Self-pay | Admitting: Anesthesiology

## 2012-09-03 ENCOUNTER — Ambulatory Visit (HOSPITAL_COMMUNITY)
Admission: RE | Admit: 2012-09-03 | Discharge: 2012-09-03 | Disposition: A | Discharge status: Home / Self Care | Payer: Medicare Other | Source: Ambulatory Visit | Attending: Ophthalmology | Admitting: Ophthalmology

## 2012-09-03 ENCOUNTER — Ambulatory Visit (HOSPITAL_COMMUNITY): Payer: Medicare Other | Admitting: Anesthesiology

## 2012-09-03 ENCOUNTER — Encounter (HOSPITAL_COMMUNITY)
Admission: RE | Disposition: A | Discharge status: Home / Self Care | Payer: Self-pay | Source: Ambulatory Visit | Attending: Ophthalmology

## 2012-09-03 DIAGNOSIS — H2589 Other age-related cataract: Secondary | ICD-10-CM | POA: Insufficient documentation

## 2012-09-03 DIAGNOSIS — Z01812 Encounter for preprocedural laboratory examination: Secondary | ICD-10-CM | POA: Insufficient documentation

## 2012-09-03 DIAGNOSIS — J449 Chronic obstructive pulmonary disease, unspecified: Secondary | ICD-10-CM | POA: Insufficient documentation

## 2012-09-03 DIAGNOSIS — J4489 Other specified chronic obstructive pulmonary disease: Secondary | ICD-10-CM | POA: Insufficient documentation

## 2012-09-03 DIAGNOSIS — I10 Essential (primary) hypertension: Secondary | ICD-10-CM | POA: Insufficient documentation

## 2012-09-03 DIAGNOSIS — Z9981 Dependence on supplemental oxygen: Secondary | ICD-10-CM | POA: Insufficient documentation

## 2012-09-03 DIAGNOSIS — E119 Type 2 diabetes mellitus without complications: Secondary | ICD-10-CM | POA: Insufficient documentation

## 2012-09-03 DIAGNOSIS — Z01810 Encounter for preprocedural cardiovascular examination: Secondary | ICD-10-CM | POA: Insufficient documentation

## 2012-09-03 HISTORY — PX: CATARACT EXTRACTION W/PHACO: SHX586

## 2012-09-03 SURGERY — PHACOEMULSIFICATION, CATARACT, WITH IOL INSERTION
Anesthesia: Monitor Anesthesia Care | Site: Eye | Laterality: Right | Wound class: Clean

## 2012-09-03 MED ORDER — CYCLOPENTOLATE-PHENYLEPHRINE 0.2-1 % OP SOLN
1.0000 [drp] | OPHTHALMIC | Status: AC
  Administered 2012-09-03 (×3): 1 [drp] via OPHTHALMIC

## 2012-09-03 MED ORDER — POVIDONE-IODINE 5 % OP SOLN
OPHTHALMIC | Status: DC | PRN
  Administered 2012-09-03: 1 via OPHTHALMIC

## 2012-09-03 MED ORDER — LACTATED RINGERS IV SOLN
INTRAVENOUS | Status: DC
  Administered 2012-09-03: 1000 mL via INTRAVENOUS

## 2012-09-03 MED ORDER — MIDAZOLAM HCL 2 MG/2ML IJ SOLN
1.0000 mg | INTRAMUSCULAR | Status: DC | PRN
Start: 2012-09-03 — End: 2012-09-03
  Administered 2012-09-03: 2 mg via INTRAVENOUS

## 2012-09-03 MED ORDER — PHENYLEPHRINE HCL 2.5 % OP SOLN
1.0000 [drp] | OPHTHALMIC | Status: AC
  Administered 2012-09-03 (×3): 1 [drp] via OPHTHALMIC

## 2012-09-03 MED ORDER — LIDOCAINE 3.5 % OP GEL OPTIME - NO CHARGE
OPHTHALMIC | Status: DC | PRN
  Administered 2012-09-03: 2 [drp] via OPHTHALMIC

## 2012-09-03 MED ORDER — MIDAZOLAM HCL 2 MG/2ML IJ SOLN
INTRAMUSCULAR | Status: AC
  Filled 2012-09-03: qty 2

## 2012-09-03 MED ORDER — EPINEPHRINE HCL 1 MG/ML IJ SOLN
INTRAMUSCULAR | Status: AC
  Filled 2012-09-03: qty 1

## 2012-09-03 MED ORDER — EPINEPHRINE HCL 1 MG/ML IJ SOLN
INTRAOCULAR | Status: DC | PRN
  Administered 2012-09-03: 14:00:00

## 2012-09-03 MED ORDER — ONDANSETRON HCL 4 MG/2ML IJ SOLN: 4.0000 mg | Freq: Once | INTRAMUSCULAR | Status: DC | PRN

## 2012-09-03 MED ORDER — BSS IO SOLN
INTRAOCULAR | Status: DC | PRN
  Administered 2012-09-03: 15 mL via INTRAOCULAR

## 2012-09-03 MED ORDER — TETRACAINE HCL 0.5 % OP SOLN
1.0000 [drp] | OPHTHALMIC | Status: AC
  Administered 2012-09-03 (×3): 1 [drp] via OPHTHALMIC

## 2012-09-03 MED ORDER — FENTANYL CITRATE 0.05 MG/ML IJ SOLN: 25.0000 ug | INTRAMUSCULAR | Status: DC | PRN

## 2012-09-03 MED ORDER — LIDOCAINE HCL 3.5 % OP GEL
1.0000 "application " | Freq: Once | OPHTHALMIC | Status: AC
  Administered 2012-09-03: 1 via OPHTHALMIC

## 2012-09-03 MED ORDER — LIDOCAINE HCL (PF) 1 % IJ SOLN
INTRAMUSCULAR | Status: DC | PRN
  Administered 2012-09-03: .4 mL

## 2012-09-03 MED ORDER — PROVISC 10 MG/ML IO SOLN
INTRAOCULAR | Status: DC | PRN
  Administered 2012-09-03: 8.5 mg via INTRAOCULAR

## 2012-09-03 MED ORDER — NEOMYCIN-POLYMYXIN-DEXAMETH 0.1 % OP OINT
TOPICAL_OINTMENT | OPHTHALMIC | Status: DC | PRN
  Administered 2012-09-03: 1 via OPHTHALMIC

## 2012-09-03 SURGICAL SUPPLY — 32 items

## 2012-09-03 NOTE — Transfer of Care (Signed)
Immediate Anesthesia Transfer of Care Note  Patient: Paige Rhodes  Procedure(s) Performed: Procedure(s) (LRB) with comments: CATARACT EXTRACTION PHACO AND INTRAOCULAR LENS PLACEMENT (IOC) (Right) - CDE: 15.26  Patient Location: Short Stay  Anesthesia Type:MAC  Level of Consciousness: awake, alert , oriented and patient cooperative  Airway & Oxygen Therapy: Patient Spontanous Breathing  Post-op Assessment: Report given to PACU RN, Post -op Vital signs reviewed and stable and Patient moving all extremities  Post vital signs: Reviewed and stable  Complications: No apparent anesthesia complications

## 2012-09-03 NOTE — Anesthesia Postprocedure Evaluation (Signed)
  Anesthesia Post-op Note  Patient: Paige Rhodes  Procedure(s) Performed: Procedure(s) (LRB) with comments: CATARACT EXTRACTION PHACO AND INTRAOCULAR LENS PLACEMENT (IOC) (Right) - CDE: 15.26  Patient Location: Short Stay  Anesthesia Type:MAC  Level of Consciousness: awake, alert , oriented and patient cooperative  Airway and Oxygen Therapy: Patient Spontanous Breathing  Post-op Pain: none  Post-op Assessment: Post-op Vital signs reviewed, Patient's Cardiovascular Status Stable, Respiratory Function Stable, Patent Airway, No signs of Nausea or vomiting and Pain level controlled  Post-op Vital Signs: Reviewed and stable  Complications: No apparent anesthesia complications

## 2012-09-03 NOTE — Brief Op Note (Signed)
Pre-Op Dx: Cataract OD Post-Op Dx: Cataract OD Surgeon: Zuriel Yeaman Anesthesia: Topical with MAC Surgery: Cataract Extraction with Intraocular lens Implant OD Implant: B&L enVista Specimen: None Complications: None 

## 2012-09-03 NOTE — Anesthesia Preprocedure Evaluation (Signed)
Anesthesia Evaluation  Patient identified by MRN, date of birth, ID band Patient awake    Reviewed: Allergy & Precautions, H&P , NPO status , Patient's Chart, lab work & pertinent test results, reviewed documented beta blocker date and time   Airway Mallampati: III TM Distance: >3 FB   Mouth opening: Limited Mouth Opening  Dental  (+) Teeth Intact   Pulmonary shortness of breath and with exertion, COPD oxygen dependent,  breath sounds clear to auscultation        Cardiovascular hypertension, Pt. on medications Rhythm:Regular Rate:Normal     Neuro/Psych    GI/Hepatic   Endo/Other  diabetes, Well Controlled, Type 2Hypothyroidism   Renal/GU      Musculoskeletal   Abdominal   Peds  Hematology   Anesthesia Other Findings   Reproductive/Obstetrics                           Anesthesia Physical Anesthesia Plan  ASA: III  Anesthesia Plan: MAC   Post-op Pain Management:    Induction: Intravenous  Airway Management Planned: Nasal Cannula  Additional Equipment:   Intra-op Plan:   Post-operative Plan:   Informed Consent: I have reviewed the patients History and Physical, chart, labs and discussed the procedure including the risks, benefits and alternatives for the proposed anesthesia with the patient or authorized representative who has indicated his/her understanding and acceptance.     Plan Discussed with:   Anesthesia Plan Comments:         Anesthesia Quick Evaluation

## 2012-09-03 NOTE — H&P (Signed)
I have reviewed the H&P, the patient was re-examined, and I have identified no interval changes in medical condition and plan of care since the history and physical of record  

## 2012-09-04 ENCOUNTER — Encounter (HOSPITAL_COMMUNITY): Payer: Self-pay | Admitting: Ophthalmology

## 2012-09-04 NOTE — Op Note (Signed)
[name], Paige Rhodes                ACCOUNT NO.:  [id number]  MEDICAL RECORD NO.:  [id number]  LOCATION:  APPO                          FACILITY:  [facility]  PHYSICIAN:  Susanne Greenhouse, MD       DATE OF BIRTH:  [birth year]  DATE OF PROCEDURE:  09/03/2012 DATE OF DISCHARGE:  09/03/2012                              OPERATIVE REPORT   PREOPERATIVE DIAGNOSIS:  Combined cataract, right eye, diagnosis code 366.19.  POSTOPERATIVE DIAGNOSIS:  Combined cataract, right eye, diagnosis code 366.19.  OPERATION PERFORMED:  Phacoemulsification with posterior chamber intraocular lens implantation, right eye.  SURGEON:  Bonne Dolores. Gaylia Kassel, MD  ANESTHESIA:  Topical with monitored anesthesia care and IV sedation.  OPERATIVE SUMMARY:  In the preoperative area, dilating drops were placed into the right eye.  The patient was then brought into the operating room where she was placed under general anesthesia.  The eye was then prepped and draped.  Beginning with a 75 blade, a paracentesis port was made at the surgeon's 2 o'clock position.  The anterior chamber was then filled with a 1% nonpreserved lidocaine solution with epinephrine.  This was followed by Viscoat to deepen the chamber.  A small fornix-based peritomy was performed superiorly.  Next, a single iris hook was placed through the limbus superiorly.  A 2.4-mm keratome blade was then used to make a clear corneal incision over the iris hook.  A bent cystotome needle and Utrata forceps were used to create a continuous tear capsulotomy.  Hydrodissection was performed using balanced salt solution on a fine cannula.  The lens nucleus was then removed using phacoemulsification in a quadrant cracking technique.  The cortical material was then removed with irrigation and aspiration.  The capsular bag and anterior chamber were refilled with Provisc.  The wound was widened to approximately 3 mm and a posterior chamber intraocular lens was placed into the capsular  bag without difficulty using an [employer] lens injecting system.  A single 10-0 nylon suture was then used to close the incision as well as stromal hydration.  The Provisc was removed from the anterior chamber and capsular bag with irrigation and aspiration.  At this point, the wounds were tested for leak, which were negative.  The anterior chamber remained deep and stable.  The patient tolerated the procedure well.  There were no operative complications, and she awoke from general anesthesia without problem.  No surgical specimens.  Prosthetic device used Bausch and Lomb posterior chamber lens, model EnVista, model number MX60, power of 22.5, serial number is [id number].          ______________________________ Susanne Greenhouse, MD     KEH/MEDQ  D:  09/03/2012  T:  09/03/2012  Job:  [id number]

## 2012-09-17 ENCOUNTER — Encounter (HOSPITAL_COMMUNITY): Payer: Self-pay | Admitting: Pharmacy Technician

## 2012-09-22 ENCOUNTER — Encounter (HOSPITAL_COMMUNITY)
Admission: RE | Admit: 2012-09-22 | Discharge: 2012-09-22 | Disposition: A | Discharge status: Home / Self Care | Payer: Medicare Other | Source: Ambulatory Visit | Attending: Ophthalmology | Admitting: Ophthalmology

## 2012-09-22 ENCOUNTER — Encounter (HOSPITAL_COMMUNITY): Payer: Self-pay

## 2012-09-23 ENCOUNTER — Encounter (HOSPITAL_COMMUNITY): Payer: Medicare Other | Attending: Oncology

## 2012-09-23 DIAGNOSIS — C50419 Malignant neoplasm of upper-outer quadrant of unspecified female breast: Secondary | ICD-10-CM

## 2012-09-23 DIAGNOSIS — Z95828 Presence of other vascular implants and grafts: Secondary | ICD-10-CM

## 2012-09-23 DIAGNOSIS — Z9889 Other specified postprocedural states: Secondary | ICD-10-CM | POA: Insufficient documentation

## 2012-09-23 DIAGNOSIS — Z452 Encounter for adjustment and management of vascular access device: Secondary | ICD-10-CM

## 2012-09-23 MED ORDER — SODIUM CHLORIDE 0.9 % IJ SOLN
10.0000 mL | INTRAMUSCULAR | Status: DC | PRN
  Administered 2012-09-23: 10 mL via INTRAVENOUS
  Filled 2012-09-23: qty 10

## 2012-09-23 MED ORDER — HEPARIN SOD (PORK) LOCK FLUSH 100 UNIT/ML IV SOLN
500.0000 [IU] | Freq: Once | INTRAVENOUS | Status: AC
  Administered 2012-09-23: 500 [IU] via INTRAVENOUS
  Filled 2012-09-23: qty 5

## 2012-09-23 MED ORDER — HEPARIN SOD (PORK) LOCK FLUSH 100 UNIT/ML IV SOLN
INTRAVENOUS | Status: AC
  Filled 2012-09-23: qty 5

## 2012-09-23 NOTE — Progress Notes (Signed)
Barrett F Hehr presented for Portacath access and flush. Proper placement of portacath confirmed by CXR. Portacath located right chest wall accessed with  H 20 needle. Good blood return present. Portacath flushed with 20ml NS and 500U/5ml Heparin and needle removed intact. Procedure without incident. Patient tolerated procedure well.   

## 2012-09-25 MED ORDER — LIDOCAINE HCL (PF) 1 % IJ SOLN
INTRAMUSCULAR | Status: AC
  Filled 2012-09-25: qty 2

## 2012-09-25 MED ORDER — NEOMYCIN-POLYMYXIN-DEXAMETH 3.5-10000-0.1 OP OINT
TOPICAL_OINTMENT | OPHTHALMIC | Status: AC
  Filled 2012-09-25: qty 3.5

## 2012-09-25 MED ORDER — LIDOCAINE HCL 3.5 % OP GEL
OPHTHALMIC | Status: AC
  Filled 2012-09-25: qty 5

## 2012-09-25 MED ORDER — TETRACAINE HCL 0.5 % OP SOLN
OPHTHALMIC | Status: AC
  Filled 2012-09-25: qty 2

## 2012-09-25 MED ORDER — CYCLOPENTOLATE-PHENYLEPHRINE 0.2-1 % OP SOLN
OPHTHALMIC | Status: AC
  Filled 2012-09-25: qty 2

## 2012-09-28 ENCOUNTER — Encounter (HOSPITAL_COMMUNITY): Payer: Self-pay | Admitting: Anesthesiology

## 2012-09-28 ENCOUNTER — Encounter (HOSPITAL_COMMUNITY)
Admission: RE | Disposition: A | Discharge status: Home / Self Care | Payer: Self-pay | Source: Ambulatory Visit | Attending: Ophthalmology

## 2012-09-28 ENCOUNTER — Ambulatory Visit (HOSPITAL_COMMUNITY)
Admission: RE | Admit: 2012-09-28 | Discharge: 2012-09-28 | Disposition: A | Discharge status: Home / Self Care | Payer: Medicare Other | Source: Ambulatory Visit | Attending: Ophthalmology | Admitting: Ophthalmology

## 2012-09-28 ENCOUNTER — Encounter (HOSPITAL_COMMUNITY): Payer: Self-pay | Admitting: *Deleted

## 2012-09-28 ENCOUNTER — Ambulatory Visit (HOSPITAL_COMMUNITY): Payer: Medicare Other | Admitting: Anesthesiology

## 2012-09-28 DIAGNOSIS — J449 Chronic obstructive pulmonary disease, unspecified: Secondary | ICD-10-CM | POA: Insufficient documentation

## 2012-09-28 DIAGNOSIS — E119 Type 2 diabetes mellitus without complications: Secondary | ICD-10-CM | POA: Insufficient documentation

## 2012-09-28 DIAGNOSIS — J4489 Other specified chronic obstructive pulmonary disease: Secondary | ICD-10-CM | POA: Insufficient documentation

## 2012-09-28 DIAGNOSIS — H2589 Other age-related cataract: Secondary | ICD-10-CM | POA: Insufficient documentation

## 2012-09-28 DIAGNOSIS — I10 Essential (primary) hypertension: Secondary | ICD-10-CM | POA: Insufficient documentation

## 2012-09-28 DIAGNOSIS — Z01812 Encounter for preprocedural laboratory examination: Secondary | ICD-10-CM | POA: Insufficient documentation

## 2012-09-28 HISTORY — PX: CATARACT EXTRACTION W/PHACO: SHX586

## 2012-09-28 LAB — GLUCOSE, CAPILLARY: Glucose-Capillary: 127 mg/dL — ABNORMAL HIGH (ref 70–99)

## 2012-09-28 SURGERY — PHACOEMULSIFICATION, CATARACT, WITH IOL INSERTION
Anesthesia: Monitor Anesthesia Care | Site: Eye | Laterality: Left | Wound class: Clean

## 2012-09-28 MED ORDER — MIDAZOLAM HCL 2 MG/2ML IJ SOLN
INTRAMUSCULAR | Status: AC
  Filled 2012-09-28: qty 2

## 2012-09-28 MED ORDER — MIDAZOLAM HCL 2 MG/2ML IJ SOLN
1.0000 mg | INTRAMUSCULAR | Status: DC | PRN
  Administered 2012-09-28 (×2): 1 mg via INTRAVENOUS

## 2012-09-28 MED ORDER — POVIDONE-IODINE 5 % OP SOLN
OPHTHALMIC | Status: DC | PRN
  Administered 2012-09-28: 1 via OPHTHALMIC

## 2012-09-28 MED ORDER — LIDOCAINE 3.5 % OP GEL OPTIME - NO CHARGE
OPHTHALMIC | Status: DC | PRN
  Administered 2012-09-28: 2 [drp] via OPHTHALMIC

## 2012-09-28 MED ORDER — LIDOCAINE HCL 3.5 % OP GEL
1.0000 "application " | Freq: Once | OPHTHALMIC | Status: AC
  Administered 2012-09-28: 1 via OPHTHALMIC

## 2012-09-28 MED ORDER — LIDOCAINE HCL (PF) 1 % IJ SOLN
INTRAMUSCULAR | Status: DC | PRN
  Administered 2012-09-28: .5 mL

## 2012-09-28 MED ORDER — LACTATED RINGERS IV SOLN
INTRAVENOUS | Status: DC
  Administered 2012-09-28: 1000 mL via INTRAVENOUS

## 2012-09-28 MED ORDER — PHENYLEPHRINE HCL 2.5 % OP SOLN
1.0000 [drp] | OPHTHALMIC | Status: AC
  Administered 2012-09-28 (×3): 1 [drp] via OPHTHALMIC

## 2012-09-28 MED ORDER — PROVISC 10 MG/ML IO SOLN
INTRAOCULAR | Status: DC | PRN
  Administered 2012-09-28: 8.5 mg via INTRAOCULAR

## 2012-09-28 MED ORDER — CYCLOPENTOLATE-PHENYLEPHRINE 0.2-1 % OP SOLN
1.0000 [drp] | OPHTHALMIC | Status: AC
  Administered 2012-09-28 (×3): 1 [drp] via OPHTHALMIC

## 2012-09-28 MED ORDER — NEOMYCIN-POLYMYXIN-DEXAMETH 0.1 % OP OINT
TOPICAL_OINTMENT | OPHTHALMIC | Status: DC | PRN
  Administered 2012-09-28: 1 via OPHTHALMIC

## 2012-09-28 MED ORDER — TETRACAINE HCL 0.5 % OP SOLN
1.0000 [drp] | OPHTHALMIC | Status: AC
  Administered 2012-09-28 (×3): 1 [drp] via OPHTHALMIC

## 2012-09-28 MED ORDER — EPINEPHRINE HCL 1 MG/ML IJ SOLN
INTRAOCULAR | Status: DC | PRN
  Administered 2012-09-28: 13:00:00

## 2012-09-28 MED ORDER — EPINEPHRINE HCL 1 MG/ML IJ SOLN
INTRAMUSCULAR | Status: AC
  Filled 2012-09-28: qty 1

## 2012-09-28 MED ORDER — BSS IO SOLN
INTRAOCULAR | Status: DC | PRN
  Administered 2012-09-28: 15 mL via INTRAOCULAR

## 2012-09-28 SURGICAL SUPPLY — 33 items
CAPSULAR TENSION RING-AMO (OPHTHALMIC RELATED) IMPLANT
CLOTH BEACON ORANGE TIMEOUT ST (SAFETY) ×1 IMPLANT
EYE SHIELD UNIVERSAL CLEAR (GAUZE/BANDAGES/DRESSINGS) ×1 IMPLANT
GLOVE BIO SURGEON STRL SZ 6.5 (GLOVE) IMPLANT
GLOVE BIOGEL PI IND STRL 6.5 (GLOVE) IMPLANT
GLOVE BIOGEL PI IND STRL 7.0 (GLOVE) IMPLANT
GLOVE BIOGEL PI IND STRL 7.5 (GLOVE) IMPLANT
GLOVE BIOGEL PI INDICATOR 6.5 (GLOVE) ×2
GLOVE BIOGEL PI INDICATOR 7.0 (GLOVE)
GLOVE BIOGEL PI INDICATOR 7.5 (GLOVE)
GLOVE ECLIPSE 6.5 STRL STRAW (GLOVE) IMPLANT
GLOVE ECLIPSE 7.0 STRL STRAW (GLOVE) IMPLANT
GLOVE ECLIPSE 7.5 STRL STRAW (GLOVE) IMPLANT
GLOVE EXAM NITRILE LRG STRL (GLOVE) IMPLANT
GLOVE EXAM NITRILE MD LF STRL (GLOVE) ×1 IMPLANT
GLOVE SKINSENSE NS SZ6.5 (GLOVE)
GLOVE SKINSENSE NS SZ7.0 (GLOVE)
GLOVE SKINSENSE STRL SZ6.5 (GLOVE) IMPLANT
GLOVE SKINSENSE STRL SZ7.0 (GLOVE) IMPLANT
GOWN BRE IMP SLV AUR LG STRL (GOWN DISPOSABLE) ×1 IMPLANT
KIT VITRECTOMY (OPHTHALMIC RELATED) IMPLANT
PAD ARMBOARD 7.5X6 YLW CONV (MISCELLANEOUS) ×1 IMPLANT
PROC W NO LENS (INTRAOCULAR LENS)
PROC W SPEC LENS (INTRAOCULAR LENS)
PROCESS W NO LENS (INTRAOCULAR LENS) IMPLANT
PROCESS W SPEC LENS (INTRAOCULAR LENS) IMPLANT
RING MALYGIN (MISCELLANEOUS) IMPLANT
SIGHTPATH CAT PROC W REG LENS (Ophthalmic Related) ×2 IMPLANT
SYR TB 1ML LL NO SAFETY (SYRINGE) ×1 IMPLANT
TAPE SURG TRANSPORE 1 IN (GAUZE/BANDAGES/DRESSINGS) IMPLANT
TAPE SURGICAL TRANSPORE 1 IN (GAUZE/BANDAGES/DRESSINGS) ×1
VISCOELASTIC ADDITIONAL (OPHTHALMIC RELATED) IMPLANT
WATER STERILE IRR 250ML POUR (IV SOLUTION) ×1 IMPLANT

## 2012-09-28 NOTE — Brief Op Note (Signed)
Pre-Op Dx: Cataract OS Post-Op Dx: Cataract OS Surgeon: Briane Birden Anesthesia: Topical with MAC Surgery: Cataract Extraction with Intraocular lens Implant OS Implant: B&L enVista Specimen: None Complications: None 

## 2012-09-28 NOTE — Anesthesia Preprocedure Evaluation (Signed)
Anesthesia Evaluation  Patient identified by MRN, date of birth, ID band Patient awake    Reviewed: Allergy & Precautions, H&P , NPO status , Patient's Chart, lab work & pertinent test results, reviewed documented beta blocker date and time   Airway Mallampati: III TM Distance: >3 FB   Mouth opening: Limited Mouth Opening  Dental  (+) Teeth Intact   Pulmonary shortness of breath, with exertion and Long-Term Oxygen Therapy, COPD oxygen dependent,  breath sounds clear to auscultation        Cardiovascular hypertension, Pt. on medications and Pt. on home beta blockers Rhythm:Regular Rate:Normal     Neuro/Psych negative neurological ROS  negative psych ROS   GI/Hepatic negative GI ROS, Neg liver ROS,   Endo/Other  diabetes, Well Controlled, Type 2Hypothyroidism   Renal/GU      Musculoskeletal   Abdominal   Peds  Hematology   Anesthesia Other Findings   Reproductive/Obstetrics                           Anesthesia Physical Anesthesia Plan  ASA: III  Anesthesia Plan: MAC   Post-op Pain Management:    Induction: Intravenous  Airway Management Planned: Nasal Cannula  Additional Equipment:   Intra-op Plan:   Post-operative Plan:   Informed Consent: I have reviewed the patients History and Physical, chart, labs and discussed the procedure including the risks, benefits and alternatives for the proposed anesthesia with the patient or authorized representative who has indicated his/her understanding and acceptance.     Plan Discussed with: CRNA  Anesthesia Plan Comments:         Anesthesia Quick Evaluation

## 2012-09-28 NOTE — Transfer of Care (Signed)
Immediate Anesthesia Transfer of Care Note  Patient: Paige Rhodes  Procedure(s) Performed: Procedure(s) (LRB): CATARACT EXTRACTION PHACO AND INTRAOCULAR LENS PLACEMENT (IOC) (Left)  Patient Location: Shortstay  Anesthesia Type: MAC  Level of Consciousness: awake  Airway & Oxygen Therapy: Patient Spontanous Breathing   Post-op Assessment: Report given to PACU RN, Post -op Vital signs reviewed and stable and Patient moving all extremities  Post vital signs: Reviewed and stable  Complications: No apparent anesthesia complications

## 2012-09-28 NOTE — Anesthesia Postprocedure Evaluation (Signed)
  Anesthesia Post-op Note  Patient: Paige Rhodes  Procedure(s) Performed: Procedure(s) (LRB): CATARACT EXTRACTION PHACO AND INTRAOCULAR LENS PLACEMENT (IOC) (Left)  Patient Location:  Short Stay  Anesthesia Type: MAC  Level of Consciousness: awake  Airway and Oxygen Therapy: Patient Spontanous Breathing  Post-op Pain: none  Post-op Assessment: Post-op Vital signs reviewed, Patient's Cardiovascular Status Stable, Respiratory Function Stable, Patent Airway, No signs of Nausea or vomiting and Pain level controlled  Post-op Vital Signs: Reviewed and stable  Complications: No apparent anesthesia complications

## 2012-09-28 NOTE — Anesthesia Procedure Notes (Signed)
Procedure Name: MAC Date/Time: 09/28/2012 12:30 PM Performed by: Franco Nones Pre-anesthesia Checklist: Patient identified, Emergency Drugs available, Suction available, Timeout performed and Patient being monitored Patient Re-evaluated:Patient Re-evaluated prior to inductionOxygen Delivery Method: Nasal Cannula

## 2012-09-28 NOTE — H&P (Signed)
I have reviewed the H&P, the patient was re-examined, and I have identified no interval changes in medical condition and plan of care since the history and physical of record  

## 2012-09-29 ENCOUNTER — Encounter (HOSPITAL_COMMUNITY): Payer: Self-pay | Admitting: Ophthalmology

## 2012-09-29 NOTE — Op Note (Signed)
Paige Rhodes, Paige Rhodes                ACCOUNT NO.:  [id number]  MEDICAL RECORD NO.:  [id number]  LOCATION:  APPO                          FACILITY:  [facility]  PHYSICIAN:  Susanne Greenhouse, MD       DATE OF BIRTH:  Jan 13, 1932  DATE OF PROCEDURE:  09/28/2012 DATE OF DISCHARGE:  09/28/2012                              OPERATIVE REPORT   PREOPERATIVE DIAGNOSIS:  Combined cataract, left eye, diagnosis code 366.19.  POSTOPERATIVE DIAGNOSES:  Combined cataract, left eye, diagnosis code 366.19.  ANESTHESIA:  Topical with monitored anesthesia care and IV sedation.  OPERATIVE SUMMARY:  In the preoperative area, dilating drops were placed into the left eye.  The patient was then brought into the operating room where she was placed under general anesthesia.  The eye was then prepped and draped.  Beginning with a 75 blade, a paracentesis port was made at the surgeon's 2 o'clock position.  The anterior chamber was then filled with a 1% nonpreserved lidocaine solution with epinephrine.  This was followed by Viscoat to deepen the chamber.  A small fornix-based peritomy was performed superiorly.  Next, a single iris hook was placed through the limbus superiorly.  A 2.4-mm keratome blade was then used to make a clear corneal incision over the iris hook.  A bent cystotome needle and Utrata forceps were used to create a continuous tear capsulotomy.  Hydrodissection was performed using balanced salt solution on a fine cannula.  The lens nucleus was then removed using phacoemulsification in a quadrant cracking technique.  The cortical material was then removed with irrigation and aspiration.  The capsular bag and anterior chamber were refilled with Provisc.  The wound was widened to approximately 3 mm and a posterior chamber intraocular lens was placed into the capsular bag without difficulty using an [employer] lens injecting system.  A single 10-0 nylon suture was then used to close the incision as well as  stromal hydration.  The Provisc was removed from the anterior chamber and capsular bag with irrigation and aspiration.  At this point, the wounds were tested for leak, which were negative.  The anterior chamber remained deep and stable.  The patient tolerated the procedure well.  There were no operative complications, and she awoke from general anesthesia without problem.  There were no surgical specimens.  Prosthetic device used Bausch and Lomb posterior chamber lens, model EnVista, model number MX60, power of 22.5, serial number is [id number].          ______________________________ Susanne Greenhouse, MD    KEH/MEDQ  D:  09/28/2012  T:  09/29/2012  Job:  [id number]

## 2012-10-20 ENCOUNTER — Encounter (HOSPITAL_COMMUNITY): Payer: Medicare Other | Attending: Oncology

## 2012-10-20 DIAGNOSIS — J449 Chronic obstructive pulmonary disease, unspecified: Secondary | ICD-10-CM | POA: Insufficient documentation

## 2012-10-20 DIAGNOSIS — C50419 Malignant neoplasm of upper-outer quadrant of unspecified female breast: Secondary | ICD-10-CM

## 2012-10-20 DIAGNOSIS — E119 Type 2 diabetes mellitus without complications: Secondary | ICD-10-CM | POA: Insufficient documentation

## 2012-10-20 DIAGNOSIS — E669 Obesity, unspecified: Secondary | ICD-10-CM | POA: Insufficient documentation

## 2012-10-20 DIAGNOSIS — Z9981 Dependence on supplemental oxygen: Secondary | ICD-10-CM | POA: Insufficient documentation

## 2012-10-20 DIAGNOSIS — Z853 Personal history of malignant neoplasm of breast: Secondary | ICD-10-CM | POA: Insufficient documentation

## 2012-10-20 DIAGNOSIS — C50919 Malignant neoplasm of unspecified site of unspecified female breast: Secondary | ICD-10-CM

## 2012-10-20 DIAGNOSIS — Z09 Encounter for follow-up examination after completed treatment for conditions other than malignant neoplasm: Secondary | ICD-10-CM | POA: Insufficient documentation

## 2012-10-20 DIAGNOSIS — J4489 Other specified chronic obstructive pulmonary disease: Secondary | ICD-10-CM | POA: Insufficient documentation

## 2012-10-20 LAB — COMPREHENSIVE METABOLIC PANEL
AST: 18 U/L (ref 0–37)
Albumin: 3.8 g/dL (ref 3.5–5.2)
Alkaline Phosphatase: 66 U/L (ref 39–117)
Chloride: 103 mEq/L (ref 96–112)
Potassium: 3.8 mEq/L (ref 3.5–5.1)
Total Bilirubin: 0.2 mg/dL — ABNORMAL LOW (ref 0.3–1.2)

## 2012-10-20 LAB — CBC WITH DIFFERENTIAL/PLATELET
Basophils Absolute: 0 10*3/uL (ref 0.0–0.1)
Basophils Relative: 0 % (ref 0–1)
Hemoglobin: 11.2 g/dL — ABNORMAL LOW (ref 12.0–15.0)
MCHC: 33.8 g/dL (ref 30.0–36.0)
Monocytes Relative: 8 % (ref 3–12)
Neutro Abs: 4.4 10*3/uL (ref 1.7–7.7)
Neutrophils Relative %: 64 % (ref 43–77)
RDW: 12.8 % (ref 11.5–15.5)
WBC: 6.9 10*3/uL (ref 4.0–10.5)

## 2012-10-20 NOTE — Progress Notes (Signed)
Labs drawn today for cbc/diff,cmp 

## 2012-10-23 ENCOUNTER — Encounter (HOSPITAL_BASED_OUTPATIENT_CLINIC_OR_DEPARTMENT_OTHER): Payer: Medicare Other | Admitting: Oncology

## 2012-10-23 VITALS — BP 128/70 | HR 60 | Temp 97.6°F | Resp 24 | Wt 172.0 lb

## 2012-10-23 DIAGNOSIS — Z170 Estrogen receptor positive status [ER+]: Secondary | ICD-10-CM

## 2012-10-23 DIAGNOSIS — E119 Type 2 diabetes mellitus without complications: Secondary | ICD-10-CM

## 2012-10-23 DIAGNOSIS — C50919 Malignant neoplasm of unspecified site of unspecified female breast: Secondary | ICD-10-CM

## 2012-10-23 DIAGNOSIS — J449 Chronic obstructive pulmonary disease, unspecified: Secondary | ICD-10-CM

## 2012-10-23 NOTE — Progress Notes (Signed)
#  1 stage II (T2, N0) high-grade infiltrating ductal carcinoma of the left breast, 2.4 cm in size with a negative sentinel lymph node. Her cancer was ER/PR negative, but HER-2/neu over amplified. Ki-67 marker was high at 80%, 3 lymph nodes were all negative, however LV I was found. I treated her with carboplatinum/Taxol and Herceptin, the latter for 52 weeks. Her last dose of trastuzumab with September 2012  #2 COPD on oxygen around the clock basically #3 diabetes mellitus diet-controlled #4 obesity weighing 172 pounds on a 5 foot 2 inch frame  She is doing very well from the review of systems standpoint. She recently had cataract surgery which went very well. She is however still using oxygen. She still has rales at both lung bases. They do not clear with coughing. Her heart however sounds good. There's no S3 gallop or murmur. The left breast is thickened but without masses. The right breast is negative for masses. She has no lymphadenopathy in the cervical, supraclavicular, infraclavicular, axillary or inguinal areas. Abdomen remains obese without organomegaly. She has no leg edema and no arm edema. Her lungs are clear except for the rales at both lung bases. She had diminished breath sounds throughout.  Her laboratory work showed a minimal elevation in her postprandial glucose only. Her hemoglobin is minimally low but unremarkable.  She looks great and we will see her back in 6 months

## 2012-10-23 NOTE — Patient Instructions (Addendum)
.  [facility] Discharge Instructions  RECOMMENDATIONS MADE BY THE CONSULTANT AND ANY TEST RESULTS WILL BE SENT TO YOUR REFERRING PHYSICIAN.  EXAM FINDINGS BY THE PHYSICIAN TODAY AND SIGNS OR SYMPTOMS TO REPORT TO CLINIC OR PRIMARY PHYSICIAN: You are doing great   INSTRUCTIONS GIVEN AND DISCUSSED: Report any new lumps or bumps  SPECIAL INSTRUCTIONS/FOLLOW-UP: Labs then to see [place] in 6 months  Thank you for choosing [facility] to provide your oncology and hematology care.  To afford each patient quality time with our providers, please arrive at least 15 minutes before your scheduled appointment time.  With your help, our goal is to use those 15 minutes to complete the necessary work-up to ensure our physicians have the information they need to help with your evaluation and healthcare recommendations.    Effective January 1st, 2014, we ask that you re-schedule your appointment with our physicians should you arrive 10 or more minutes late for your appointment.  We strive to give you quality time with our providers, and arriving late affects you and other patients whose appointments are after yours.    Again, thank you for choosing [facility].  Our hope is that these requests will decrease the amount of time that you wait before being seen by our physicians.       _____________________________________________________________  Should you have questions after your visit to [facility], please contact our office at [phone] between the hours of 8:30 a.m. and 5:00 p.m.  Voicemails left after 4:30 p.m. will not be returned until the following business day.  For prescription refill requests, have your pharmacy contact our office with your prescription refill request.

## 2012-11-04 ENCOUNTER — Encounter (HOSPITAL_COMMUNITY): Payer: Medicare Other | Attending: Oncology

## 2012-11-04 DIAGNOSIS — Z9889 Other specified postprocedural states: Secondary | ICD-10-CM | POA: Insufficient documentation

## 2012-11-04 DIAGNOSIS — Z95828 Presence of other vascular implants and grafts: Secondary | ICD-10-CM

## 2012-11-04 MED ORDER — HEPARIN SOD (PORK) LOCK FLUSH 100 UNIT/ML IV SOLN
INTRAVENOUS | Status: AC
  Filled 2012-11-04: qty 5

## 2012-11-04 MED ORDER — SODIUM CHLORIDE 0.9 % IJ SOLN
10.0000 mL | INTRAMUSCULAR | Status: DC | PRN
  Administered 2012-11-04: 10 mL via INTRAVENOUS
  Filled 2012-11-04: qty 10

## 2012-11-04 MED ORDER — HEPARIN SOD (PORK) LOCK FLUSH 100 UNIT/ML IV SOLN
500.0000 [IU] | Freq: Once | INTRAVENOUS | Status: AC
  Administered 2012-11-04: 500 [IU] via INTRAVENOUS
  Filled 2012-11-04: qty 5

## 2012-11-04 NOTE — Progress Notes (Signed)
Paige Rhodes presented for Portacath access and flush. Proper placement of portacath confirmed by CXR. Portacath located right chest wall accessed with  H 20 needle. Good blood return present. Portacath flushed with 20ml NS and 500U/5ml Heparin and needle removed intact. Procedure without incident. Patient tolerated procedure well.   

## 2012-12-16 ENCOUNTER — Encounter (HOSPITAL_COMMUNITY): Payer: Medicare Other | Attending: Oncology

## 2012-12-16 DIAGNOSIS — C50419 Malignant neoplasm of upper-outer quadrant of unspecified female breast: Secondary | ICD-10-CM

## 2012-12-16 DIAGNOSIS — Z452 Encounter for adjustment and management of vascular access device: Secondary | ICD-10-CM

## 2012-12-16 DIAGNOSIS — Z95828 Presence of other vascular implants and grafts: Secondary | ICD-10-CM

## 2012-12-16 DIAGNOSIS — Z9889 Other specified postprocedural states: Secondary | ICD-10-CM | POA: Insufficient documentation

## 2012-12-16 MED ORDER — SODIUM CHLORIDE 0.9 % IJ SOLN
10.0000 mL | INTRAMUSCULAR | Status: DC | PRN
  Administered 2012-12-16: 10 mL via INTRAVENOUS
  Filled 2012-12-16: qty 10

## 2012-12-16 MED ORDER — HEPARIN SOD (PORK) LOCK FLUSH 100 UNIT/ML IV SOLN
500.0000 [IU] | Freq: Once | INTRAVENOUS | Status: AC
  Administered 2012-12-16: 500 [IU] via INTRAVENOUS
  Filled 2012-12-16: qty 5

## 2012-12-16 MED ORDER — HEPARIN SOD (PORK) LOCK FLUSH 100 UNIT/ML IV SOLN
INTRAVENOUS | Status: AC
  Filled 2012-12-16: qty 5

## 2012-12-16 NOTE — Progress Notes (Signed)
Lyndel Safe presented for Portacath access and flush. Proper placement of portacath confirmed by CXR. Portacath located left chest wall accessed with  H 20 needle. Good blood return present. Portacath flushed with 20ml NS and 500U/35ml Heparin and needle removed intact. Procedure without incident. Patient tolerated procedure well.

## 2013-01-27 ENCOUNTER — Encounter (HOSPITAL_COMMUNITY): Payer: Medicare Other | Attending: Oncology

## 2013-01-27 DIAGNOSIS — Z9889 Other specified postprocedural states: Secondary | ICD-10-CM | POA: Insufficient documentation

## 2013-01-27 MED ORDER — HEPARIN SOD (PORK) LOCK FLUSH 100 UNIT/ML IV SOLN
INTRAVENOUS | Status: AC
  Filled 2013-01-27: qty 5

## 2013-03-10 ENCOUNTER — Encounter (HOSPITAL_COMMUNITY): Payer: Medicare Other | Attending: Oncology

## 2013-03-10 DIAGNOSIS — Z9889 Other specified postprocedural states: Secondary | ICD-10-CM | POA: Insufficient documentation

## 2013-03-10 DIAGNOSIS — Z452 Encounter for adjustment and management of vascular access device: Secondary | ICD-10-CM

## 2013-03-10 DIAGNOSIS — C50419 Malignant neoplasm of upper-outer quadrant of unspecified female breast: Secondary | ICD-10-CM

## 2013-03-10 MED ORDER — HEPARIN SOD (PORK) LOCK FLUSH 100 UNIT/ML IV SOLN
500.0000 [IU] | Freq: Once | INTRAVENOUS | Status: AC
  Administered 2013-03-10: 500 [IU] via INTRAVENOUS
  Filled 2013-03-10: qty 5

## 2013-03-10 MED ORDER — HEPARIN SOD (PORK) LOCK FLUSH 100 UNIT/ML IV SOLN
INTRAVENOUS | Status: AC
  Filled 2013-03-10: qty 5

## 2013-03-10 MED ORDER — SODIUM CHLORIDE 0.9 % IJ SOLN
10.0000 mL | INTRAMUSCULAR | Status: DC | PRN
  Administered 2013-03-10: 10 mL via INTRAVENOUS
  Filled 2013-03-10: qty 10

## 2013-03-10 NOTE — Progress Notes (Signed)
Paige Rhodes presented for Portacath access and flush.  Proper placement of portacath confirmed by CXR.  Portacath located right chest wall accessed with  H 20 needle.  Good blood return present. Portacath flushed with 20ml NS and 500U/5ml Heparin and needle removed intact.  Procedure tolerated well and without incident.    

## 2013-04-08 ENCOUNTER — Other Ambulatory Visit (HOSPITAL_COMMUNITY): Payer: Self-pay | Admitting: Internal Medicine

## 2013-04-08 DIAGNOSIS — C50912 Malignant neoplasm of unspecified site of left female breast: Secondary | ICD-10-CM

## 2013-04-20 ENCOUNTER — Other Ambulatory Visit (HOSPITAL_COMMUNITY): Payer: Medicare Other

## 2013-04-21 ENCOUNTER — Ambulatory Visit (HOSPITAL_COMMUNITY)
Admission: RE | Admit: 2013-04-21 | Discharge: 2013-04-21 | Disposition: A | Discharge status: Home / Self Care | Payer: Medicare Other | Source: Ambulatory Visit | Attending: Internal Medicine | Admitting: Internal Medicine

## 2013-04-21 ENCOUNTER — Encounter (HOSPITAL_COMMUNITY): Payer: Medicare Other | Attending: Hematology and Oncology

## 2013-04-21 DIAGNOSIS — Z95828 Presence of other vascular implants and grafts: Secondary | ICD-10-CM

## 2013-04-21 DIAGNOSIS — Z452 Encounter for adjustment and management of vascular access device: Secondary | ICD-10-CM

## 2013-04-21 DIAGNOSIS — Z1231 Encounter for screening mammogram for malignant neoplasm of breast: Secondary | ICD-10-CM | POA: Insufficient documentation

## 2013-04-21 DIAGNOSIS — C50912 Malignant neoplasm of unspecified site of left female breast: Secondary | ICD-10-CM

## 2013-04-21 DIAGNOSIS — C50419 Malignant neoplasm of upper-outer quadrant of unspecified female breast: Secondary | ICD-10-CM

## 2013-04-21 DIAGNOSIS — C50919 Malignant neoplasm of unspecified site of unspecified female breast: Secondary | ICD-10-CM | POA: Insufficient documentation

## 2013-04-21 LAB — CBC WITH DIFFERENTIAL/PLATELET
Basophils Absolute: 0 10*3/uL (ref 0.0–0.1)
Eosinophils Absolute: 0.2 10*3/uL (ref 0.0–0.7)
Eosinophils Relative: 4 % (ref 0–5)
MCH: 29.3 pg (ref 26.0–34.0)
MCV: 88.8 fL (ref 78.0–100.0)
Platelets: 219 10*3/uL (ref 150–400)
RDW: 13 % (ref 11.5–15.5)

## 2013-04-21 LAB — COMPREHENSIVE METABOLIC PANEL
ALT: 12 U/L (ref 0–35)
AST: 15 U/L (ref 0–37)
Albumin: 3.8 g/dL (ref 3.5–5.2)
Alkaline Phosphatase: 63 U/L (ref 39–117)
CO2: 32 mEq/L (ref 19–32)
Chloride: 101 mEq/L (ref 96–112)
GFR calc non Af Amer: 78 mL/min — ABNORMAL LOW (ref >90)
Potassium: 4.1 mEq/L (ref 3.5–5.1)
Total Bilirubin: 0.2 mg/dL — ABNORMAL LOW (ref 0.3–1.2)

## 2013-04-21 MED ORDER — SODIUM CHLORIDE 0.9 % IJ SOLN
10.0000 mL | INTRAMUSCULAR | Status: DC | PRN
  Administered 2013-04-21: 10 mL via INTRAVENOUS
  Filled 2013-04-21: qty 10

## 2013-04-21 MED ORDER — HEPARIN SOD (PORK) LOCK FLUSH 100 UNIT/ML IV SOLN
INTRAVENOUS | Status: AC
  Filled 2013-04-21: qty 5

## 2013-04-21 MED ORDER — HEPARIN SOD (PORK) LOCK FLUSH 100 UNIT/ML IV SOLN
500.0000 [IU] | Freq: Once | INTRAVENOUS | Status: AC
  Administered 2013-04-21: 500 [IU] via INTRAVENOUS
  Filled 2013-04-21: qty 5

## 2013-04-21 NOTE — Progress Notes (Signed)
Paige Rhodes presented for Portacath access and flush. Proper placement of portacath confirmed by CXR. Portacath located right chest wall accessed with  H 20 needle. Good blood return present. Portacath flushed with 20ml NS and 500U/5ml Heparin and needle removed intact. Procedure without incident. Patient tolerated procedure well.   

## 2013-04-22 ENCOUNTER — Other Ambulatory Visit (HOSPITAL_COMMUNITY): Payer: Self-pay | Admitting: Internal Medicine

## 2013-04-22 DIAGNOSIS — M810 Age-related osteoporosis without current pathological fracture: Secondary | ICD-10-CM

## 2013-04-23 ENCOUNTER — Encounter (HOSPITAL_COMMUNITY): Payer: Self-pay

## 2013-04-23 ENCOUNTER — Encounter (HOSPITAL_BASED_OUTPATIENT_CLINIC_OR_DEPARTMENT_OTHER): Payer: Medicare Other

## 2013-04-23 VITALS — BP 130/72 | HR 54 | Temp 98.1°F | Resp 20 | Wt 176.8 lb

## 2013-04-23 DIAGNOSIS — J449 Chronic obstructive pulmonary disease, unspecified: Secondary | ICD-10-CM

## 2013-04-23 DIAGNOSIS — C50419 Malignant neoplasm of upper-outer quadrant of unspecified female breast: Secondary | ICD-10-CM

## 2013-04-23 DIAGNOSIS — C50912 Malignant neoplasm of unspecified site of left female breast: Secondary | ICD-10-CM

## 2013-04-23 DIAGNOSIS — M810 Age-related osteoporosis without current pathological fracture: Secondary | ICD-10-CM

## 2013-04-23 DIAGNOSIS — Z171 Estrogen receptor negative status [ER-]: Secondary | ICD-10-CM

## 2013-04-23 NOTE — Patient Instructions (Signed)
[facility] Discharge Instructions  RECOMMENDATIONS MADE BY THE CONSULTANT AND ANY TEST RESULTS WILL BE SENT TO YOUR REFERRING PHYSICIAN.  EXAM FINDINGS BY THE PHYSICIAN TODAY AND SIGNS OR SYMPTOMS TO REPORT TO CLINIC OR PRIMARY PHYSICIAN: Exam findings as discussed by Dr. Sharia Reeve.  SPECIAL INSTRUCTIONS/FOLLOW-UP: 1.  Please keep your follow-up appointment in 6 months as scheduled.  Please contact our office with any concerns.  Report any new lumps/bumps.  Thank you for choosing [facility] to provide your oncology and hematology care.  To afford each patient quality time with our providers, please arrive at least 15 minutes before your scheduled appointment time.  With your help, our goal is to use those 15 minutes to complete the necessary work-up to ensure our physicians have the information they need to help with your evaluation and healthcare recommendations.    Effective January 1st, 2014, we ask that you re-schedule your appointment with our physicians should you arrive 10 or more minutes late for your appointment.  We strive to give you quality time with our providers, and arriving late affects you and other patients whose appointments are after yours.    Again, thank you for choosing [facility].  Our hope is that these requests will decrease the amount of time that you wait before being seen by our physicians.       _____________________________________________________________  Should you have questions after your visit to [facility], please contact our office at [phone] between the hours of 8:30 a.m. and 5:00 p.m.  Voicemails left after 4:30 p.m. will not be returned until the following business day.  For prescription refill requests, have your pharmacy contact our office with your prescription refill request.

## 2013-04-23 NOTE — Progress Notes (Signed)
[facility] [phone]   Fax:([phone]  OFFICE PROGRESS NOTE  Carylon Perches, MD [address] [place] [address]  DIAGNOSIS: Invasive ductal carcinoma of the left breast.   ONCOLOGIC HISTORY:Stage II (T2, N0)  Invasive ductal carcinoma of the left breast diagnosed on 03/28/2010. She lumpectomy on 04/25/2010, her cancer was 2.3 cm in size with a negative sentinel lymph node. Her cancer was ER/PR negative, but HER-2/neu over amplified. Ki-67 marker was high at 80%, 3 lymph nodes were all negative, however LV I was found. She was treated with carboplatinum/Taxol and Herceptin, for a total of 52 weeks. Her last dose of trastuzumab with September,12, 2012 . Since then she's been followed without evidence of disease recurrence.   INTERVAL HISTORY:   Paige Rhodes 79 y.o. female returns to the clinic today for scheduled follow up.  She denies any new problem.  She has not noticed any breast lumps or so denies any heart related complaints. Patient is short of breath and has reportedly been oxygen for 2 years but this is unchanged. Patient does not have any weight loss and denies pain.    MEDICAL HISTORY: Past Medical History  Diagnosis Date  . Diabetes mellitus   . Hypertension   . Hyperlipidemia   . Thyroid disease   . Osteoporosis   . COPD (chronic obstructive pulmonary disease)   . SOB (shortness of breath)   . Port catheter in place 01/15/2012  . Gout attack june 2013    left foot  . Glaucoma   . Hypothyroidism   . Breast cancer   . Infiltrating ductal carcinoma of left breast 03/05/2011    ALLERGIES:  is allergic to penicillins; hctz; morphine and related; and lotrel.    REVIEW OF SYSTEMS:  14 point review of system is as in the history above otherwise negative.   PHYSICAL EXAMINATION:  Blood pressure 130/72, pulse 54, temperature 98.1 F (36.7 C), temperature source Oral, resp. rate 20, weight 176 lb 12.8 oz (80.196  kg). GENERAL: No acute distress. SKIN:  No rashes or significant lesions . No ecchymosis or petechial rash. HEAD: Normocephalic, No masses, lesions, tenderness or abnormalities  EYES: Conjunctiva are pink and non-injected and no jaundice LYMPH: No palpable lymphadenopathy, in the neck, supraclavicular areas or axilla. BREAST:Normal right without mass, left breast has well-healed surgical scar and diffuse induration consistent with the breast scarring.  No definite mass palpable in LUNGS: Clear to auscultation , no crackles or wheezes HEART: regular rate & rhythm, no murmurs, no gallops, S1 normal and S2 normal and no S3. ABDOMEN: Abdomen soft, non-tender, no masses or organomegaly and no hepatosplenomegaly palpable EXTREMITIES: No edema, no skin discoloration or tenderness NEURO: Alert & oriented      LABORATORY DATA: Lab Results  Component Value Date   WBC 6.6 04/21/2013   HGB 11.0* 04/21/2013   HCT 33.3* 04/21/2013   MCV 88.8 04/21/2013   PLT 219 04/21/2013      Chemistry      Component Value Date/Time   NA 141 04/21/2013 1303   K 4.1 04/21/2013 1303   CL 101 04/21/2013 1303   CO2 32 04/21/2013 1303   BUN 26* 04/21/2013 1303   CREATININE 0.76 04/21/2013 1303      Component Value Date/Time   CALCIUM 10.0 04/21/2013 1303   ALKPHOS 63 04/21/2013 1303   AST 15 04/21/2013 1303   ALT 12 04/21/2013 1303   BILITOT 0.2* 04/21/2013 1303  RADIOGRAPHIC STUDIES: Mm Digital Diagnostic Bilat  04/21/2013    BI-RADS CATEGORY 2:  Benign finding(s).      ASSESSMENT:  Stage II carcinoma of the left breast ER and PR negative HER-2/neu positive.  Patient has no evidence of disease at this time.   PLAN:  1. She'll return to clinic in 6 months.   2.I told her that mammogram of the stages optional and usually not recommended by the [place] preventive task force for age > 26.     All questions were satisfactorily answered. Patient knows to call if  any concern arises.  I spent more than 50 %  counseling the patient face to face. The total time spent in the appointment was 30 minutes.   Sherral Hammers, MD FACP. Hematology/Oncology.

## 2013-04-26 ENCOUNTER — Ambulatory Visit (HOSPITAL_COMMUNITY)
Admission: RE | Admit: 2013-04-26 | Discharge: 2013-04-26 | Disposition: A | Discharge status: Home / Self Care | Payer: Medicare Other | Source: Ambulatory Visit | Attending: Internal Medicine | Admitting: Internal Medicine

## 2013-04-26 DIAGNOSIS — M810 Age-related osteoporosis without current pathological fracture: Secondary | ICD-10-CM

## 2013-04-26 DIAGNOSIS — Z780 Asymptomatic menopausal state: Secondary | ICD-10-CM | POA: Insufficient documentation

## 2013-06-02 ENCOUNTER — Encounter (HOSPITAL_COMMUNITY): Payer: Medicare Other | Attending: Hematology and Oncology

## 2013-06-02 DIAGNOSIS — Z9889 Other specified postprocedural states: Secondary | ICD-10-CM | POA: Insufficient documentation

## 2013-06-02 DIAGNOSIS — Z95828 Presence of other vascular implants and grafts: Secondary | ICD-10-CM

## 2013-06-02 DIAGNOSIS — Z452 Encounter for adjustment and management of vascular access device: Secondary | ICD-10-CM

## 2013-06-02 DIAGNOSIS — C50419 Malignant neoplasm of upper-outer quadrant of unspecified female breast: Secondary | ICD-10-CM

## 2013-06-02 MED ORDER — HEPARIN SOD (PORK) LOCK FLUSH 100 UNIT/ML IV SOLN
INTRAVENOUS | Status: AC
  Filled 2013-06-02: qty 5

## 2013-06-02 MED ORDER — SODIUM CHLORIDE 0.9 % IJ SOLN
10.0000 mL | INTRAMUSCULAR | Status: DC | PRN
  Administered 2013-06-02: 10 mL via INTRAVENOUS

## 2013-06-02 MED ORDER — HEPARIN SOD (PORK) LOCK FLUSH 100 UNIT/ML IV SOLN
500.0000 [IU] | Freq: Once | INTRAVENOUS | Status: AC
  Administered 2013-06-02: 500 [IU] via INTRAVENOUS

## 2013-06-02 NOTE — Progress Notes (Signed)
Paige Rhodes presented for Portacath access and flush. Proper placement of portacath confirmed by CXR. Portacath located right chest wall accessed with  H 20 needle. Good blood return present. Portacath flushed with 20ml NS and 500U/5ml Heparin and needle removed intact. Procedure without incident. Patient tolerated procedure well.   

## 2013-07-14 ENCOUNTER — Encounter (HOSPITAL_COMMUNITY): Payer: Medicare Other | Attending: Hematology and Oncology

## 2013-07-14 DIAGNOSIS — Z95828 Presence of other vascular implants and grafts: Secondary | ICD-10-CM

## 2013-07-14 DIAGNOSIS — Z9889 Other specified postprocedural states: Secondary | ICD-10-CM | POA: Insufficient documentation

## 2013-07-14 DIAGNOSIS — C50419 Malignant neoplasm of upper-outer quadrant of unspecified female breast: Secondary | ICD-10-CM

## 2013-07-14 DIAGNOSIS — Z452 Encounter for adjustment and management of vascular access device: Secondary | ICD-10-CM

## 2013-07-14 MED ORDER — HEPARIN SOD (PORK) LOCK FLUSH 100 UNIT/ML IV SOLN
500.0000 [IU] | Freq: Once | INTRAVENOUS | Status: AC
  Administered 2013-07-14: 500 [IU] via INTRAVENOUS

## 2013-07-14 MED ORDER — SODIUM CHLORIDE 0.9 % IJ SOLN
10.0000 mL | INTRAMUSCULAR | Status: DC | PRN
  Administered 2013-07-14: 10 mL via INTRAVENOUS

## 2013-07-14 MED ORDER — HEPARIN SOD (PORK) LOCK FLUSH 100 UNIT/ML IV SOLN
INTRAVENOUS | Status: AC
  Filled 2013-07-14: qty 5

## 2013-07-14 NOTE — Progress Notes (Signed)
Paige Rhodes presented for Portacath access and flush. Proper placement of portacath confirmed by CXR. Portacath located right chest wall accessed with  H 20 needle. Good blood return present. Portacath flushed with 20ml NS and 500U/5ml Heparin and needle removed intact. Procedure without incident. Patient tolerated procedure well.   

## 2013-08-24 ENCOUNTER — Encounter (HOSPITAL_COMMUNITY): Payer: Medicare Other | Attending: Hematology and Oncology

## 2013-08-24 DIAGNOSIS — C50419 Malignant neoplasm of upper-outer quadrant of unspecified female breast: Secondary | ICD-10-CM

## 2013-08-24 DIAGNOSIS — Z452 Encounter for adjustment and management of vascular access device: Secondary | ICD-10-CM

## 2013-08-24 DIAGNOSIS — Z9889 Other specified postprocedural states: Secondary | ICD-10-CM | POA: Insufficient documentation

## 2013-08-24 DIAGNOSIS — Z95828 Presence of other vascular implants and grafts: Secondary | ICD-10-CM

## 2013-08-24 MED ORDER — HEPARIN SOD (PORK) LOCK FLUSH 100 UNIT/ML IV SOLN
INTRAVENOUS | Status: AC
  Filled 2013-08-24: qty 5

## 2013-08-24 MED ORDER — HEPARIN SOD (PORK) LOCK FLUSH 100 UNIT/ML IV SOLN
500.0000 [IU] | Freq: Once | INTRAVENOUS | Status: AC
  Administered 2013-08-24: 500 [IU] via INTRAVENOUS

## 2013-08-24 MED ORDER — SODIUM CHLORIDE 0.9 % IJ SOLN
10.0000 mL | INTRAMUSCULAR | Status: DC | PRN
  Administered 2013-08-24: 10 mL via INTRAVENOUS

## 2013-08-24 NOTE — Progress Notes (Signed)
Paige Rhodes presented for Portacath access and flush. Proper placement of portacath confirmed by CXR. Portacath located lt chest wall accessed with  H 20 needle. Good blood return present. Portacath flushed with 20ml NS and 500U/19ml Heparin and needle removed intact. Procedure without incident. Patient tolerated procedure well.

## 2013-10-06 ENCOUNTER — Encounter (HOSPITAL_COMMUNITY): Payer: Medicare Other | Attending: Hematology and Oncology

## 2013-10-06 DIAGNOSIS — C50419 Malignant neoplasm of upper-outer quadrant of unspecified female breast: Secondary | ICD-10-CM

## 2013-10-06 DIAGNOSIS — C50919 Malignant neoplasm of unspecified site of unspecified female breast: Secondary | ICD-10-CM | POA: Insufficient documentation

## 2013-10-06 LAB — COMPREHENSIVE METABOLIC PANEL
ALT: 12 U/L (ref 0–35)
AST: 17 U/L (ref 0–37)
Albumin: 3.6 g/dL (ref 3.5–5.2)
Alkaline Phosphatase: 77 U/L (ref 39–117)
BUN: 22 mg/dL (ref 6–23)
CALCIUM: 9.5 mg/dL (ref 8.4–10.5)
CO2: 29 mEq/L (ref 19–32)
Chloride: 101 mEq/L (ref 96–112)
Creatinine, Ser: 0.7 mg/dL (ref 0.50–1.10)
GFR calc non Af Amer: 80 mL/min — ABNORMAL LOW (ref >90)
GLUCOSE: 123 mg/dL — AB (ref 70–99)
POTASSIUM: 3.9 meq/L (ref 3.7–5.3)
Sodium: 141 mEq/L (ref 137–147)
TOTAL PROTEIN: 8.1 g/dL (ref 6.0–8.3)
Total Bilirubin: 0.2 mg/dL — ABNORMAL LOW (ref 0.3–1.2)

## 2013-10-06 LAB — CBC WITH DIFFERENTIAL/PLATELET
Basophils Absolute: 0 10*3/uL (ref 0.0–0.1)
Basophils Relative: 0 % (ref 0–1)
EOS PCT: 6 % — AB (ref 0–5)
Eosinophils Absolute: 0.4 10*3/uL (ref 0.0–0.7)
HEMATOCRIT: 33.1 % — AB (ref 36.0–46.0)
Hemoglobin: 11 g/dL — ABNORMAL LOW (ref 12.0–15.0)
LYMPHS ABS: 1.8 10*3/uL (ref 0.7–4.0)
LYMPHS PCT: 29 % (ref 12–46)
MCH: 28.9 pg (ref 26.0–34.0)
MCHC: 33.2 g/dL (ref 30.0–36.0)
MCV: 87.1 fL (ref 78.0–100.0)
MONO ABS: 0.6 10*3/uL (ref 0.1–1.0)
Monocytes Relative: 9 % (ref 3–12)
Neutro Abs: 3.4 10*3/uL (ref 1.7–7.7)
Neutrophils Relative %: 56 % (ref 43–77)
PLATELETS: 251 10*3/uL (ref 150–400)
RBC: 3.8 MIL/uL — AB (ref 3.87–5.11)
RDW: 13 % (ref 11.5–15.5)
WBC: 6.2 10*3/uL (ref 4.0–10.5)

## 2013-10-06 MED ORDER — HEPARIN SOD (PORK) LOCK FLUSH 100 UNIT/ML IV SOLN
INTRAVENOUS | Status: AC
  Filled 2013-10-06: qty 5

## 2013-10-06 MED ORDER — HEPARIN SOD (PORK) LOCK FLUSH 100 UNIT/ML IV SOLN
500.0000 [IU] | Freq: Once | INTRAVENOUS | Status: AC
  Administered 2013-10-06: 500 [IU] via INTRAVENOUS

## 2013-10-06 MED ORDER — SODIUM CHLORIDE 0.9 % IJ SOLN
10.0000 mL | INTRAMUSCULAR | Status: DC | PRN
  Administered 2013-10-06: 10 mL via INTRAVENOUS

## 2013-10-06 NOTE — Progress Notes (Signed)
Paige Rhodes presented for Portacath access and flush. Proper placement of portacath confirmed by CXR. Portacath located rt chest wall accessed with  H 20 needle. Good blood return present. Portacath flushed with 18ml NS and 500U/81ml Heparin and needle removed intact. Procedure without incident. Patient tolerated procedure well.

## 2013-10-07 LAB — CANCER ANTIGEN 27.29: CA 27.29: 20 U/mL (ref 0–39)

## 2013-10-07 LAB — CEA: CEA: 1.6 ng/mL (ref 0.0–5.0)

## 2013-10-25 ENCOUNTER — Ambulatory Visit (HOSPITAL_COMMUNITY): Payer: Medicare Other

## 2013-10-26 NOTE — Progress Notes (Signed)
This encounter was created in error - please disregard.

## 2013-11-01 ENCOUNTER — Encounter (HOSPITAL_COMMUNITY): Payer: Medicare Other | Attending: Hematology and Oncology

## 2013-11-01 ENCOUNTER — Encounter (HOSPITAL_COMMUNITY): Payer: Self-pay

## 2013-11-01 VITALS — BP 133/52 | HR 52 | Temp 97.7°F | Resp 20 | Wt 173.9 lb

## 2013-11-01 DIAGNOSIS — C50919 Malignant neoplasm of unspecified site of unspecified female breast: Secondary | ICD-10-CM | POA: Insufficient documentation

## 2013-11-01 DIAGNOSIS — C50419 Malignant neoplasm of upper-outer quadrant of unspecified female breast: Secondary | ICD-10-CM

## 2013-11-01 DIAGNOSIS — Z171 Estrogen receptor negative status [ER-]: Secondary | ICD-10-CM

## 2013-11-01 DIAGNOSIS — M810 Age-related osteoporosis without current pathological fracture: Secondary | ICD-10-CM

## 2013-11-01 DIAGNOSIS — Z9889 Other specified postprocedural states: Secondary | ICD-10-CM | POA: Insufficient documentation

## 2013-11-01 NOTE — Patient Instructions (Signed)
[facility] Discharge Instructions  RECOMMENDATIONS MADE BY THE CONSULTANT AND ANY TEST RESULTS WILL BE SENT TO YOUR REFERRING PHYSICIAN.  EXAM FINDINGS BY THE PHYSICIAN TODAY AND SIGNS OR SYMPTOMS TO REPORT TO CLINIC OR PRIMARY PHYSICIAN: Exam and findings as discussed by Dr. Barnet [place].  You are doing well. Lab work is stable.  Report any new lumps, bone pain, shortness of breath or other symptoms.  MEDICATIONS PRESCRIBED:  none  INSTRUCTIONS/FOLLOW-UP: Mammogram in August Follow-up in 6 months with blood work and office visit.  Thank you for choosing [facility] to provide your oncology and hematology care.  To afford each patient quality time with our providers, please arrive at least 15 minutes before your scheduled appointment time.  With your help, our goal is to use those 15 minutes to complete the necessary work-up to ensure our physicians have the information they need to help with your evaluation and healthcare recommendations.    Effective January 1st, 2014, we ask that you re-schedule your appointment with our physicians should you arrive 10 or more minutes late for your appointment.  We strive to give you quality time with our providers, and arriving late affects you and other patients whose appointments are after yours.    Again, thank you for choosing [facility].  Our hope is that these requests will decrease the amount of time that you wait before being seen by our physicians.       _____________________________________________________________  Should you have questions after your visit to [facility], please contact our office at [phone] between the hours of 8:30 a.m. and 5:00 p.m.  Voicemails left after 4:30 p.m. will not be returned until the following business day.  For prescription refill requests, have your pharmacy contact our office with your prescription refill request.

## 2013-11-01 NOTE — Addendum Note (Signed)
Addended by: Mellissa Kohut on: 11/01/2013 12:20 PM   Modules accepted: Orders

## 2013-11-01 NOTE — Progress Notes (Signed)
[facility]  OFFICE PROGRESS NOTE  Paige Noble, MD [address] [place] [address]  DIAGNOSIS: Infiltrating ductal carcinoma of breast - Plan: MM Digital Diagnostic Bilat  Chief Complaint  Patient presents with  . Breast Cancer    CURRENT THERAPY: Watchful expectation.  INTERVAL HISTORY: Paige Rhodes 80 y.o. female returns for  followup of stage II HER-2/neu positive, ER negative left breast cancer originally diagnosed in July of 2011 treated with lumpectomy, [place] followed by Herceptin alone out to one year with last Herceptin dose on 05/15/2011. The patient does practice self breast examination monthly. Appetite is good with no nausea, vomiting, diarrhea, constipation, vaginal bleeding or discharge, PND, orthopnea, palpitations, skin rash, melena, hematochezia, hematuria, bone pain, cough, wheezing, headache, or seizures.   MEDICAL HISTORY: Past Medical History  Diagnosis Date  . Diabetes mellitus   . Hypertension   . Hyperlipidemia   . Thyroid disease   . Osteoporosis   . COPD (chronic obstructive pulmonary disease)   . SOB (shortness of breath)   . Port catheter in place 01/15/2012  . Gout attack june 2013    left foot  . Glaucoma   . Hypothyroidism   . Breast cancer   . Infiltrating ductal carcinoma of left breast 03/05/2011    INTERIM HISTORY: has Infiltrating ductal carcinoma of left breast and Port catheter in place on her problem list.   Stage II (T2, N0) Invasive ductal carcinoma of the left breast diagnosed on 03/28/2010. She lumpectomy on 04/25/2010, her cancer was 2.3 cm in size with a negative sentinel lymph node. Her cancer was ER/PR negative, but HER-2/neu over amplified. Ki-67 marker was high at 80%, 3 lymph nodes were all negative, however LV I was found. She was treated with carboplatinum/Taxol and Herceptin, for a total of 52 weeks. Her last dose of trastuzumab with September,12, 2012 . Since then  she's been followed without evidence of disease recurrence  ALLERGIES:  is allergic to penicillins; hctz; morphine and related; and lotrel.  MEDICATIONS: has a current medication list which includes the following prescription(s): albuterol, alendronate, amlodipine, aspirin ec, atorvastatin, calcium carbonate, carvedilol, diphenhydramine-acetaminophen, ibuprofen, levothyroxine, and losartan.  SURGICAL HISTORY:  Past Surgical History  Procedure Laterality Date  . Appendectomy  1955  . Cholecystectomy  1990  . Thyroid surgery  1994  . Total hip arthroplasty  1996  . Ankle surgery  1998  . Breast lumpectomy  2011    left  . Tonsillectomy    . Portacath placement  05/23/10  . Cataract extraction w/phaco  09/03/2012    Procedure: CATARACT EXTRACTION PHACO AND INTRAOCULAR LENS PLACEMENT (IOC);  Surgeon: Tonny Branch, MD;  Location: [facility];  Service: Ophthalmology;  Laterality: Right;  CDE: 15.26  . Cataract extraction w/phaco  09/28/2012    Procedure: CATARACT EXTRACTION PHACO AND INTRAOCULAR LENS PLACEMENT (IOC);  Surgeon: Tonny Branch, MD;  Location: [facility];  Service: Ophthalmology;  Laterality: Left;  CDE:  21.60    FAMILY HISTORY: family history includes Cancer in her sister; Heart disease in her father; Hypertension in her brother; Kidney disease in her mother.  SOCIAL HISTORY:  reports that she quit smoking about 22 years ago. Her smoking use included Cigarettes. She has a 90 pack-year smoking history. She has never used smokeless tobacco. She reports that she does not drink alcohol or use illicit drugs.  REVIEW OF SYSTEMS:  Other than that discussed above is noncontributory.  PHYSICAL EXAMINATION: ECOG PERFORMANCE STATUS: 0 - Asymptomatic  Blood pressure 133/52, pulse 52, temperature 97.7 F (36.5 C), temperature source Oral, resp. rate 20, weight 173 lb 14.4 oz (78.881 kg), SpO2 97.00%.  GENERAL:alert, no distress and comfortable SKIN: skin color, texture, turgor are normal, no rashes  or significant lesions EYES: PERLA; Conjunctiva are pink and non-injected, sclera clear OROPHARYNX:no exudate, no erythema on lips, buccal mucosa, or tongue. NECK: supple, thyroid normal size, non-tender, without nodularity. No masses CHEST: Status post left breast lumpectomy with no masses in either breast. Tattoo marks of radiation treatment are evident on the left breast. LYMPH:  no palpable lymphadenopathy in the cervical, axillary or inguinal LUNGS: clear to auscultation and percussion with normal breathing effort HEART: regular rate & rhythm and no murmurs. ABDOMEN:abdomen soft, non-tender and normal bowel sounds MUSCULOSKELETAL:no cyanosis of digits and no clubbing. Range of motion normal.  NEURO: alert & oriented x 3 with fluent speech, no focal motor/sensory deficits   LABORATORY DATA: Infusion on 10/06/2013  Component Date Value Ref Range Status  . WBC 10/06/2013 6.2  4.0 - 10.5 K/uL Final  . RBC 10/06/2013 3.80* 3.87 - 5.11 MIL/uL Final  . Hemoglobin 10/06/2013 11.0* 12.0 - 15.0 g/dL Final  . HCT 10/06/2013 33.1* 36.0 - 46.0 % Final  . MCV 10/06/2013 87.1  78.0 - 100.0 fL Final  . MCH 10/06/2013 28.9  26.0 - 34.0 pg Final  . MCHC 10/06/2013 33.2  30.0 - 36.0 g/dL Final  . RDW 10/06/2013 13.0  11.5 - 15.5 % Final  . Platelets 10/06/2013 251  150 - 400 K/uL Final  . Neutrophils Relative % 10/06/2013 56  43 - 77 % Final  . Neutro Abs 10/06/2013 3.4  1.7 - 7.7 K/uL Final  . Lymphocytes Relative 10/06/2013 29  12 - 46 % Final  . Lymphs Abs 10/06/2013 1.8  0.7 - 4.0 K/uL Final  . Monocytes Relative 10/06/2013 9  3 - 12 % Final  . Monocytes Absolute 10/06/2013 0.6  0.1 - 1.0 K/uL Final  . Eosinophils Relative 10/06/2013 6* 0 - 5 % Final  . Eosinophils Absolute 10/06/2013 0.4  0.0 - 0.7 K/uL Final  . Basophils Relative 10/06/2013 0  0 - 1 % Final  . Basophils Absolute 10/06/2013 0.0  0.0 - 0.1 K/uL Final  . Sodium 10/06/2013 141  137 - 147 mEq/L Final  . Potassium 10/06/2013  3.9  3.7 - 5.3 mEq/L Final  . Chloride 10/06/2013 101  96 - 112 mEq/L Final  . CO2 10/06/2013 29  19 - 32 mEq/L Final  . Glucose, Bld 10/06/2013 123* 70 - 99 mg/dL Final  . BUN 10/06/2013 22  6 - 23 mg/dL Final  . Creatinine, Ser 10/06/2013 0.70  0.50 - 1.10 mg/dL Final  . Calcium 10/06/2013 9.5  8.4 - 10.5 mg/dL Final  . Total Protein 10/06/2013 8.1  6.0 - 8.3 g/dL Final  . Albumin 10/06/2013 3.6  3.5 - 5.2 g/dL Final  . AST 10/06/2013 17  0 - 37 U/L Final  . ALT 10/06/2013 12  0 - 35 U/L Final  . Alkaline Phosphatase 10/06/2013 77  39 - 117 U/L Final  . Total Bilirubin 10/06/2013 0.2* 0.3 - 1.2 mg/dL Final  . GFR calc non Af Amer 10/06/2013 80* >90 mL/min Final  . GFR calc Af Amer 10/06/2013 >90  >90 mL/min Final   Comment: (NOTE)  The eGFR has been calculated using the CKD EPI equation.                          This calculation has not been validated in all clinical situations.                          eGFR's persistently <90 mL/min signify possible Chronic Kidney                          Disease.  . CEA 10/06/2013 1.6  0.0 - 5.0 ng/mL Final   Performed at [facility]  . CA 27.29 10/06/2013 20  0 - 39 U/mL Final   Performed at [place]: No new pathology.  Urinalysis    Component Value Date/Time   COLORURINE YELLOW 04/20/2010 1123   APPEARANCEUR CLEAR 04/20/2010 1123   LABSPEC 1.006 04/20/2010 1123   PHURINE 6.5 04/20/2010 1123   GLUCOSEU NEGATIVE 04/20/2010 1123   HGBUR NEGATIVE 04/20/2010 [place] 04/20/2010 [place] 04/20/2010 1123   PROTEINUR NEGATIVE 04/20/2010 1123   UROBILINOGEN 0.2 04/20/2010 1123   NITRITE NEGATIVE 04/20/2010 1123   LEUKOCYTESUR SMALL* 04/20/2010 1123    RADIOGRAPHIC STUDIES: MM Digital Diagnostic Bilat Status: Final result            Study Result    *RADIOLOGY REPORT*  Clinical Data: The patient underwent left lumpectomy for breast  cancer in 2011.    DIGITAL DIAGNOSTIC BILATERAL MAMMOGRAM WITH CAD  Comparison: 03/25/2012, 03/20/2011, 03/16/2010  Findings:  ACR Breast Density Category b: There are scattered areas of  fibroglandular density.  Left lumpectomy and radiation therapy changes are present. There  is no suspicious dominant mass, nonsurgical architectural  distortion, or calcification to suggest malignancy.  Mammographic images were processed with CAD.  IMPRESSION:  No mammographic evidence of malignancy.  RECOMMENDATION:  Yearly diagnostic mammography is suggested.  I have discussed the findings and recommendations with the patient.  Results were also provided in writing at the conclusion of the  visit. If applicable, a reminder letter will be sent to the  patient regarding her next appointment.  BI-RADS CATEGORY 2: Benign finding(s).  Original Report Authenticated     ASSESSMENT:  #1.Stage II (T2, N0) Invasive ductal carcinoma of the left breast diagnosed on 03/28/2010. She lumpectomy on 04/25/2010, her cancer was 2.3 cm in size with a negative sentinel lymph node. Her cancer was ER/PR negative, but HER-2/neu over amplified. Ki-67 marker was high at 80%, 3 lymph nodes were all negative, however LV I was found. She was treated with carboplatinum/Taxol and Herceptin, for a total of 52 weeks. Her last dose of trastuzumab with September,12, 2012, no evidence of disease. #2. Diabetes mellitus, type II, non-insulin requiring, controlled. #3. Hypertension, controlled. #4 Hypothyroidism, on treatment. #5. Chronic obstructive pulmonary disease.   PLAN:  #1. Continue monthly self breast examination. #2. Repeat mammogram in August 2015. #3. Followup in 6 months with CBC, chem profile, CEA, and CA 27-29.   All questions were answered. The patient knows to call the clinic with any problems, questions or concerns. We can certainly see the patient much sooner if necessary.   I spent 25 minutes counseling the patient face to  face. The total time spent in the appointment was 30 minutes.    Doroteo Bradford, MD 11/01/2013 11:56 AM

## 2013-11-17 ENCOUNTER — Encounter (HOSPITAL_BASED_OUTPATIENT_CLINIC_OR_DEPARTMENT_OTHER): Payer: Medicare Other

## 2013-11-17 DIAGNOSIS — Z452 Encounter for adjustment and management of vascular access device: Secondary | ICD-10-CM

## 2013-11-17 DIAGNOSIS — Z95828 Presence of other vascular implants and grafts: Secondary | ICD-10-CM

## 2013-11-17 DIAGNOSIS — C50419 Malignant neoplasm of upper-outer quadrant of unspecified female breast: Secondary | ICD-10-CM

## 2013-11-17 MED ORDER — SODIUM CHLORIDE 0.9 % IJ SOLN: 10.0000 mL | INTRAMUSCULAR | Status: DC | PRN

## 2013-11-17 MED ORDER — HEPARIN SOD (PORK) LOCK FLUSH 100 UNIT/ML IV SOLN
INTRAVENOUS | Status: AC
  Filled 2013-11-17: qty 5

## 2013-11-17 MED ORDER — HEPARIN SOD (PORK) LOCK FLUSH 100 UNIT/ML IV SOLN
500.0000 [IU] | Freq: Once | INTRAVENOUS | Status: AC
  Administered 2013-11-17: 500 [IU] via INTRAVENOUS

## 2013-11-17 NOTE — Progress Notes (Signed)
Paige Rhodes presented for Portacath access and flush.  Proper placement of portacath confirmed by CXR.  Portacath located left  chest wall accessed with  H 20 needle.  Good blood return present. Portacath flushed with 8ml NS and 500U/19ml Heparin and needle removed intact.  Procedure tolerated well and without incident.

## 2013-12-29 ENCOUNTER — Encounter (HOSPITAL_COMMUNITY): Payer: Medicare Other | Attending: Hematology and Oncology

## 2013-12-29 DIAGNOSIS — Z452 Encounter for adjustment and management of vascular access device: Secondary | ICD-10-CM

## 2013-12-29 DIAGNOSIS — Z95828 Presence of other vascular implants and grafts: Secondary | ICD-10-CM

## 2013-12-29 DIAGNOSIS — C50419 Malignant neoplasm of upper-outer quadrant of unspecified female breast: Secondary | ICD-10-CM

## 2013-12-29 DIAGNOSIS — Z9889 Other specified postprocedural states: Secondary | ICD-10-CM | POA: Insufficient documentation

## 2013-12-29 MED ORDER — SODIUM CHLORIDE 0.9 % IJ SOLN
10.0000 mL | INTRAMUSCULAR | Status: DC | PRN
  Administered 2013-12-29: 10 mL via INTRAVENOUS

## 2013-12-29 MED ORDER — HEPARIN SOD (PORK) LOCK FLUSH 100 UNIT/ML IV SOLN
500.0000 [IU] | Freq: Once | INTRAVENOUS | Status: AC
  Administered 2013-12-29: 500 [IU] via INTRAVENOUS
  Filled 2013-12-29: qty 5

## 2013-12-29 NOTE — Progress Notes (Signed)
Paige Rhodes presented for Portacath access and flush. Proper placement of portacath confirmed by CXR. Portacath located right chest wall accessed with  H 20 needle. Good blood return present. Portacath flushed with 20ml NS and 500U/5ml Heparin and needle removed intact. Procedure without incident. Patient tolerated procedure well.   

## 2014-01-25 IMAGING — MG MM DIGITAL DIAGNOSTIC BILAT
6 series · 6 of 6 positions shown · non-contrast
Comparison: With priors

CLINICAL DATA: History of left breast cancer status post
lumpectomy 5177.

DIGITAL DIAGNOSTIC BILATERAL MAMMOGRAM WITH CAD

[L CC]
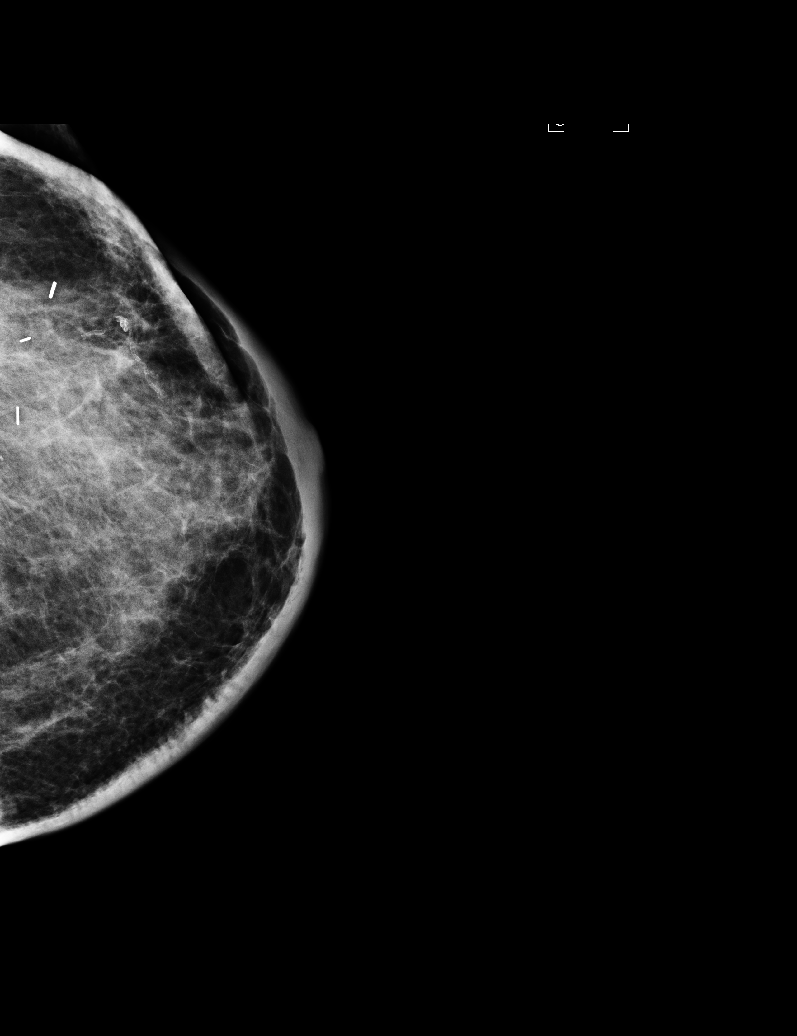

[L MLO]
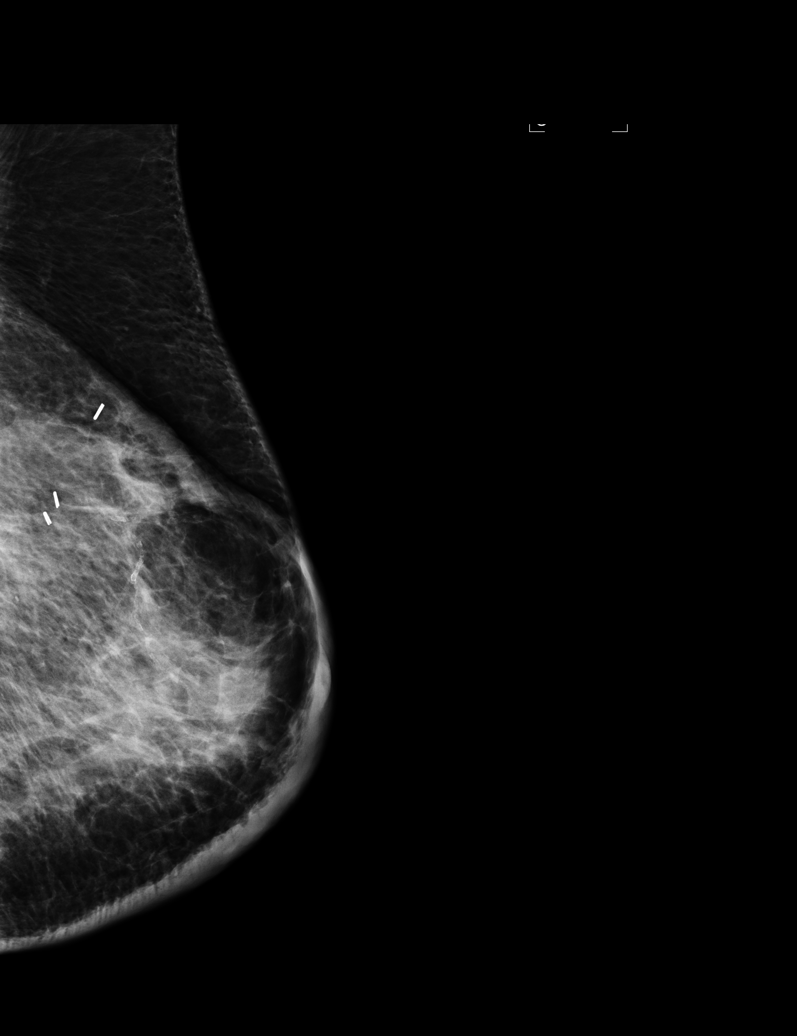

[R CC]
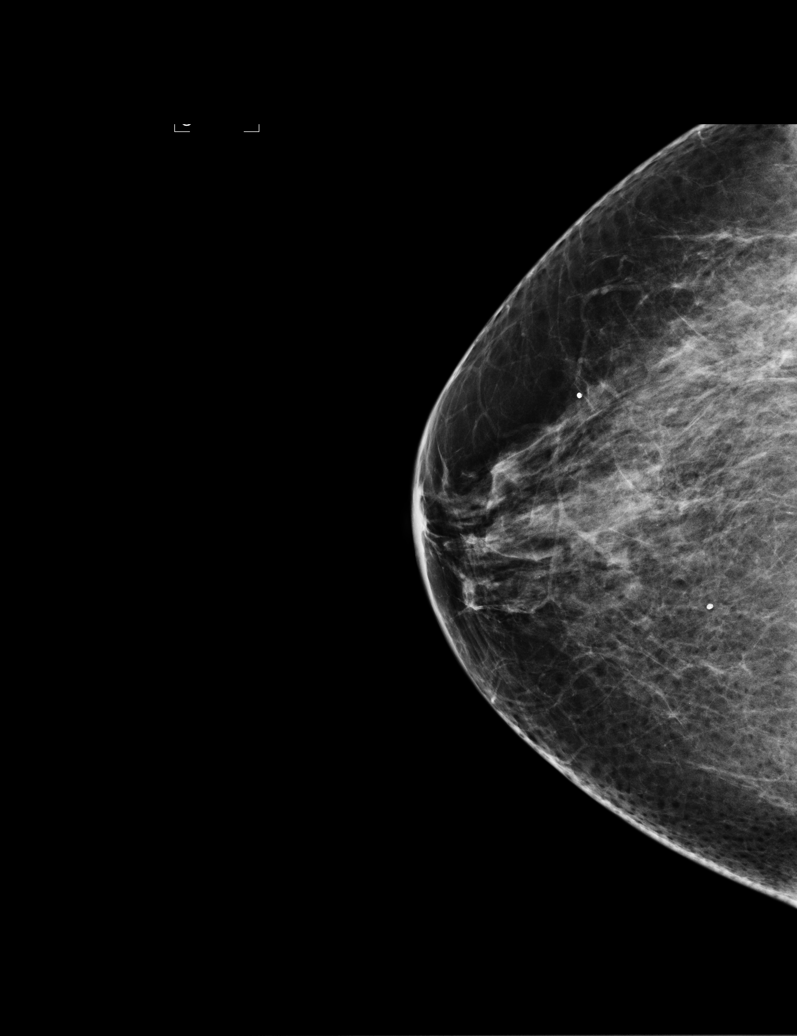

[R MLO (1 of 2)]
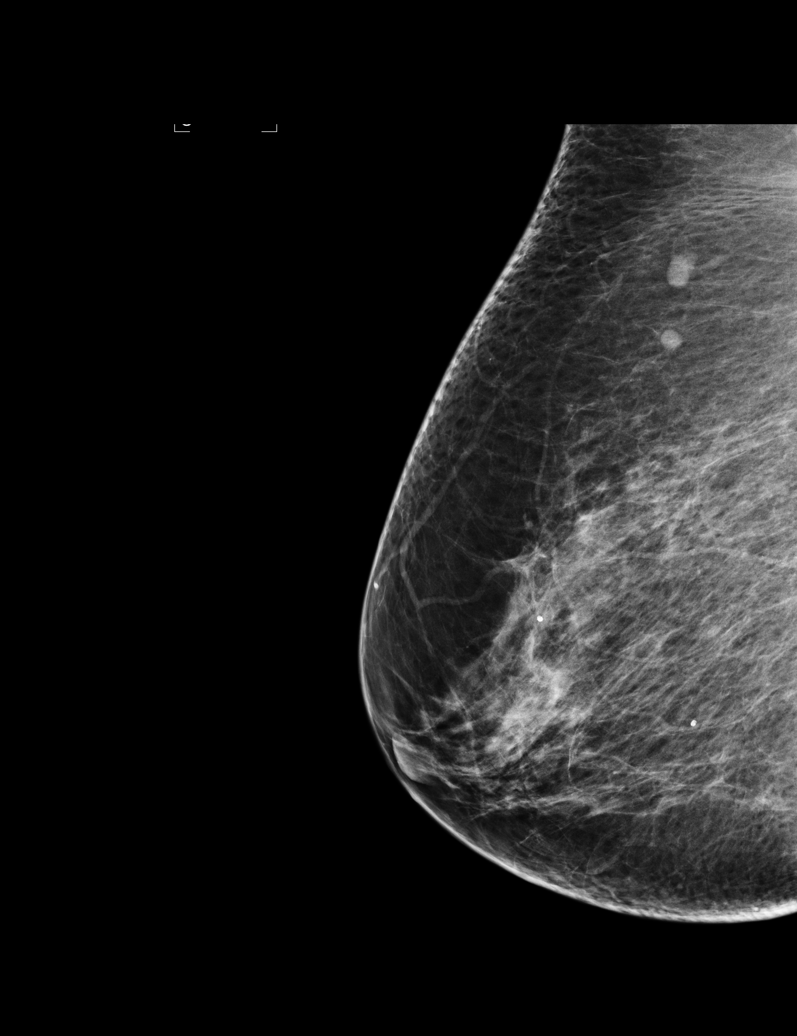

[R MLO (2 of 2)]
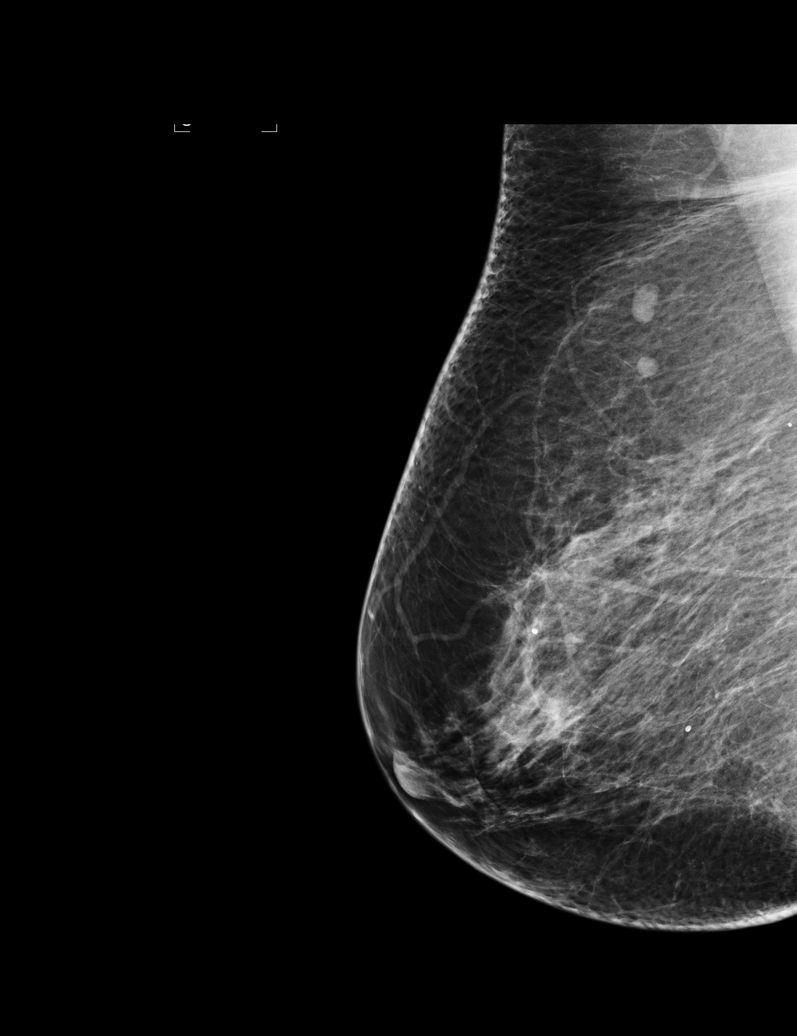

[L TAN]
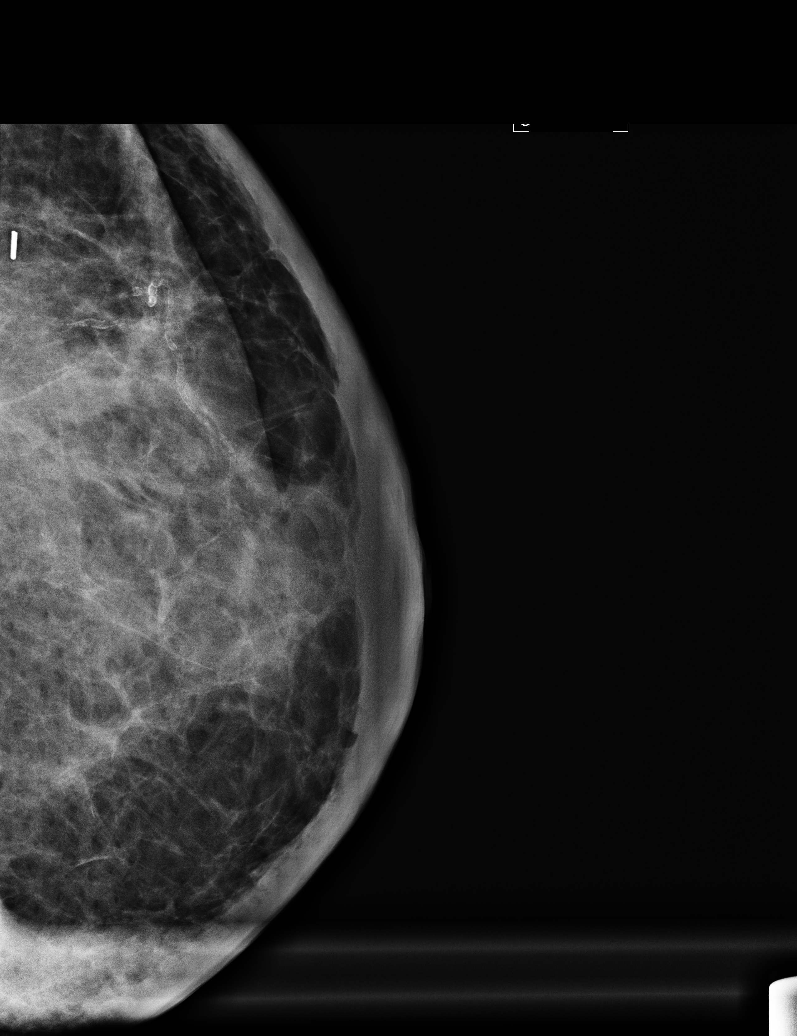

[6 of 6 positions shown; findings below may reference images not displayed]

FINDINGS: There is a dense fibroglandular pattern.  Stable
lumpectomy changes are seen in the left breast.  There is no new
suspicious mass or malignant-type microcalcifications.
Mammographic images were processed with CAD.
IMPRESSION: No evidence of malignancy in either breast.

RECOMMENDATION:
Diagnostic mammogram in 1 year is recommended.

BI-RADS CATEGORY 2:  Benign finding(s).

## 2014-02-09 ENCOUNTER — Encounter (HOSPITAL_COMMUNITY): Payer: Medicare Other | Attending: Hematology and Oncology

## 2014-02-09 DIAGNOSIS — Z95828 Presence of other vascular implants and grafts: Secondary | ICD-10-CM

## 2014-02-09 DIAGNOSIS — Z452 Encounter for adjustment and management of vascular access device: Secondary | ICD-10-CM

## 2014-02-09 DIAGNOSIS — C50419 Malignant neoplasm of upper-outer quadrant of unspecified female breast: Secondary | ICD-10-CM

## 2014-02-09 MED ORDER — SODIUM CHLORIDE 0.9 % IJ SOLN
10.0000 mL | INTRAMUSCULAR | Status: DC | PRN
  Administered 2014-02-09: 10 mL via INTRAVENOUS

## 2014-02-09 MED ORDER — HEPARIN SOD (PORK) LOCK FLUSH 100 UNIT/ML IV SOLN
500.0000 [IU] | Freq: Once | INTRAVENOUS | Status: AC
  Administered 2014-02-09: 500 [IU] via INTRAVENOUS

## 2014-02-09 MED ORDER — HEPARIN SOD (PORK) LOCK FLUSH 100 UNIT/ML IV SOLN
INTRAVENOUS | Status: AC
  Filled 2014-02-09: qty 5

## 2014-02-09 NOTE — Progress Notes (Signed)
Deyra F Linam presented for Portacath access and flush. Proper placement of portacath confirmed by CXR. Portacath located right chest wall accessed with  H 20 needle. Good blood return present. Portacath flushed with 20ml NS and 500U/5ml Heparin and needle removed intact. Procedure without incident. Patient tolerated procedure well.   

## 2014-03-23 ENCOUNTER — Encounter (HOSPITAL_COMMUNITY): Payer: Medicare Other | Attending: Hematology and Oncology

## 2014-03-23 DIAGNOSIS — Z452 Encounter for adjustment and management of vascular access device: Secondary | ICD-10-CM

## 2014-03-23 DIAGNOSIS — C50419 Malignant neoplasm of upper-outer quadrant of unspecified female breast: Secondary | ICD-10-CM

## 2014-03-23 DIAGNOSIS — Z9889 Other specified postprocedural states: Secondary | ICD-10-CM | POA: Insufficient documentation

## 2014-03-23 MED ORDER — SODIUM CHLORIDE 0.9 % IJ SOLN
10.0000 mL | INTRAMUSCULAR | Status: DC | PRN
  Administered 2014-03-23: 10 mL via INTRAVENOUS

## 2014-03-23 MED ORDER — HEPARIN SOD (PORK) LOCK FLUSH 100 UNIT/ML IV SOLN
500.0000 [IU] | Freq: Once | INTRAVENOUS | Status: AC
  Administered 2014-03-23: 500 [IU] via INTRAVENOUS
  Filled 2014-03-23: qty 5

## 2014-03-23 NOTE — Progress Notes (Signed)
Paige Rhodes presented for Portacath access and flush. Portacath located lt chest wall accessed with  H 20 needle. Good blood return present. Portacath flushed with 69ml NS and 500U/75ml Heparin and needle removed intact. Procedure without incident. Patient tolerated procedure well.

## 2014-04-26 ENCOUNTER — Ambulatory Visit (HOSPITAL_COMMUNITY)
Admission: RE | Admit: 2014-04-26 | Discharge: 2014-04-26 | Disposition: A | Discharge status: Home / Self Care | Payer: Medicare Other | Source: Ambulatory Visit | Attending: Hematology and Oncology | Admitting: Hematology and Oncology

## 2014-04-26 DIAGNOSIS — C50919 Malignant neoplasm of unspecified site of unspecified female breast: Secondary | ICD-10-CM

## 2014-04-26 DIAGNOSIS — Z853 Personal history of malignant neoplasm of breast: Secondary | ICD-10-CM | POA: Diagnosis present

## 2014-05-04 ENCOUNTER — Encounter (HOSPITAL_COMMUNITY): Payer: Medicare Other

## 2014-05-05 ENCOUNTER — Encounter (HOSPITAL_COMMUNITY): Payer: Self-pay

## 2014-05-05 ENCOUNTER — Encounter (HOSPITAL_COMMUNITY): Payer: Medicare Other | Attending: Hematology and Oncology

## 2014-05-05 VITALS — BP 116/60 | HR 54 | Temp 98.2°F | Resp 20 | Wt 175.3 lb

## 2014-05-05 DIAGNOSIS — C50912 Malignant neoplasm of unspecified site of left female breast: Secondary | ICD-10-CM

## 2014-05-05 DIAGNOSIS — Z9889 Other specified postprocedural states: Secondary | ICD-10-CM | POA: Insufficient documentation

## 2014-05-05 DIAGNOSIS — E119 Type 2 diabetes mellitus without complications: Secondary | ICD-10-CM

## 2014-05-05 DIAGNOSIS — E039 Hypothyroidism, unspecified: Secondary | ICD-10-CM

## 2014-05-05 DIAGNOSIS — I10 Essential (primary) hypertension: Secondary | ICD-10-CM

## 2014-05-05 DIAGNOSIS — C50419 Malignant neoplasm of upper-outer quadrant of unspecified female breast: Secondary | ICD-10-CM

## 2014-05-05 DIAGNOSIS — J449 Chronic obstructive pulmonary disease, unspecified: Secondary | ICD-10-CM

## 2014-05-05 MED ORDER — HEPARIN SOD (PORK) LOCK FLUSH 100 UNIT/ML IV SOLN
500.0000 [IU] | Freq: Once | INTRAVENOUS | Status: AC
  Administered 2014-05-05: 500 [IU] via INTRAVENOUS
  Filled 2014-05-05: qty 5

## 2014-05-05 MED ORDER — SODIUM CHLORIDE 0.9 % IJ SOLN
10.0000 mL | INTRAMUSCULAR | Status: AC | PRN
  Administered 2014-05-05: 10 mL via INTRAVENOUS

## 2014-05-05 NOTE — Progress Notes (Unsigned)
Paige Rhodes presented for Portacath access and flush. Proper placement of portacath confirmed by CXR. Portacath located right chest wall accessed with  H 20 needle. Good blood return present. Portacath flushed with 20ml NS and 500U/5ml Heparin and needle removed intact. Procedure without incident. Patient tolerated procedure well.   

## 2014-05-05 NOTE — Progress Notes (Signed)
[place]  OFFICE PROGRESS Resa Miner, MD [address] [place] [address]  DIAGNOSIS: Infiltrating ductal carcinoma of breast, left  Chief Complaint  Patient presents with  . Infiltrating ductal carcinoma of left breast     CURRENT THERAPY: Watchful expectation.  INTERVAL HISTORY: Paige Rhodes 80 y.o. female returns for followup of stage II HER-2/neu positive, ER negative left breast cancer originally diagnosed in July of 2011 treated with lumpectomy, [place] followed by Herceptin alone out to one year with last Herceptin dose on 05/15/2011. She has done well except for one episode of bronchitis in the spring. Appetite is good with no nausea, vomiting, diarrhea, constipation, melena, hematochezia, hematuria, nocturia, incontinence, abdominal discomfort, bone pain, cough, wheezing, lower extremity swelling or redness, lymphedema, hot flashes, vaginal discharge or bleeding, skin rash, headache, or seizures.    MEDICAL HISTORY: Past Medical History  Diagnosis Date  . Diabetes mellitus   . Hypertension   . Hyperlipidemia   . Thyroid disease   . Osteoporosis   . COPD (chronic obstructive pulmonary disease)   . SOB (shortness of breath)   . Port catheter in place 01/15/2012  . Gout attack june 2013    left foot  . Glaucoma   . Hypothyroidism   . Breast cancer   . Infiltrating ductal carcinoma of left breast 03/05/2011    INTERIM HISTORY: has Infiltrating ductal carcinoma of left breast and Port catheter in place on her problem list.   Stage II (T2, N0) Invasive ductal carcinoma of the left breast diagnosed on 03/28/2010. She lumpectomy on 04/25/2010, her cancer was 2.3 cm in size with a negative sentinel lymph node. Her cancer was ER/PR negative, but HER-2/neu over amplified. Ki-67 marker was high at 80%, 3 lymph nodes were all negative, however LV I was found. She was treated with carboplatinum/Taxol and  Herceptin, for a total of 52 weeks. Her last dose of trastuzumab with September,12, 2012   ALLERGIES:  is allergic to penicillins; hctz; morphine and related; and lotrel.  MEDICATIONS: has a current medication list which includes the following prescription(s): alendronate, amlodipine, aspirin ec, atorvastatin, calcium-vitamin d, carvedilol, diphenhydramine hcl (sleep), ibuprofen, levothyroxine, losartan, oxygen-helium, and albuterol, and the following Facility-Administered Medications: sodium chloride.  SURGICAL HISTORY:  Past Surgical History  Procedure Laterality Date  . Appendectomy  1955  . Cholecystectomy  1990  . Thyroid surgery  1994  . Total hip arthroplasty  1996  . Ankle surgery  1998  . Breast lumpectomy  2011    left  . Tonsillectomy    . Portacath placement  05/23/10  . Cataract extraction w/phaco  09/03/2012    Procedure: CATARACT EXTRACTION PHACO AND INTRAOCULAR LENS PLACEMENT (IOC);  Surgeon: Tonny Branch, MD;  Location: [facility];  Service: Ophthalmology;  Laterality: Right;  CDE: 15.26  . Cataract extraction w/phaco  09/28/2012    Procedure: CATARACT EXTRACTION PHACO AND INTRAOCULAR LENS PLACEMENT (IOC);  Surgeon: Tonny Branch, MD;  Location: [facility];  Service: Ophthalmology;  Laterality: Left;  CDE:  21.60    FAMILY HISTORY: family history includes Cancer in her sister; Heart disease in her father; Hypertension in her brother; Kidney disease in her mother.  SOCIAL HISTORY:  reports that she quit smoking about 22 years ago. Her smoking use included Cigarettes. She has a 90 pack-year smoking history. She has never used smokeless tobacco. She reports that she does not drink alcohol or use illicit  drugs.  REVIEW OF SYSTEMS:  Other than that discussed above is noncontributory.  PHYSICAL EXAMINATION: ECOG PERFORMANCE STATUS: 1 - Symptomatic but completely ambulatory  Blood pressure 116/60, pulse 54, temperature 98.2 F (36.8 C), resp. rate 20, weight 175 lb 4.8 oz (79.516 kg), SpO2  98.00%.  GENERAL:alert, no distress and comfortable. Oxygen in place. SKIN: skin color, texture, turgor are normal, no rashes or significant lesions EYES: PERLA; Conjunctiva are pink and non-injected, sclera clear SINUSES: No redness or tenderness over maxillary or ethmoid sinuses OROPHARYNX:no exudate, no erythema on lips, buccal mucosa, or tongue. NECK: supple, thyroid normal size, non-tender, without nodularity. No masses CHEST: Increased AP diameter. Status post left breast lumpectomy with distortion of the breast with induration. LifePort in place. LYMPH:  no palpable lymphadenopathy in the cervical, axillary or inguinal LUNGS: clear to auscultation and percussion with normal breathing effort HEART: regular rate & rhythm and no murmurs. ABDOMEN:abdomen soft, non-tender and normal bowel sounds MUSCULOSKELETAL:no cyanosis of digits and no clubbing. Range of motion normal.  NEURO: alert & oriented x 3 with fluent speech, no focal motor/sensory deficits   LABORATORY DATA:  04/19/2014: CBC 6.1, hemoglobin 12.1, platelets 241,000                    BUN 19, creatinine 0.68, alkalhosphatase 62, calcium 9.8, TSH 1.397   No visits with results within 30 Day(s) from this visit. Latest known visit with results is:  Infusion on 10/06/2013  Component Date Value Ref Range Status  . WBC 10/06/2013 6.2  4.0 - 10.5 K/uL Final  . RBC 10/06/2013 3.80* 3.87 - 5.11 MIL/uL Final  . Hemoglobin 10/06/2013 11.0* 12.0 - 15.0 g/dL Final  . HCT 10/06/2013 33.1* 36.0 - 46.0 % Final  . MCV 10/06/2013 87.1  78.0 - 100.0 fL Final  . MCH 10/06/2013 28.9  26.0 - 34.0 pg Final  . MCHC 10/06/2013 33.2  30.0 - 36.0 g/dL Final  . RDW 10/06/2013 13.0  11.5 - 15.5 % Final  . Platelets 10/06/2013 251  150 - 400 K/uL Final  . Neutrophils Relative % 10/06/2013 56  43 - 77 % Final  . Neutro Abs 10/06/2013 3.4  1.7 - 7.7 K/uL Final  . Lymphocytes Relative 10/06/2013 29  12 - 46 % Final  . Lymphs Abs 10/06/2013 1.8  0.7  - 4.0 K/uL Final  . Monocytes Relative 10/06/2013 9  3 - 12 % Final  . Monocytes Absolute 10/06/2013 0.6  0.1 - 1.0 K/uL Final  . Eosinophils Relative 10/06/2013 6* 0 - 5 % Final  . Eosinophils Absolute 10/06/2013 0.4  0.0 - 0.7 K/uL Final  . Basophils Relative 10/06/2013 0  0 - 1 % Final  . Basophils Absolute 10/06/2013 0.0  0.0 - 0.1 K/uL Final  . Sodium 10/06/2013 141  137 - 147 mEq/L Final  . Potassium 10/06/2013 3.9  3.7 - 5.3 mEq/L Final  . Chloride 10/06/2013 101  96 - 112 mEq/L Final  . CO2 10/06/2013 29  19 - 32 mEq/L Final  . Glucose, Bld 10/06/2013 123* 70 - 99 mg/dL Final  . BUN 10/06/2013 22  6 - 23 mg/dL Final  . Creatinine, Ser 10/06/2013 0.70  0.50 - 1.10 mg/dL Final  . Calcium 10/06/2013 9.5  8.4 - 10.5 mg/dL Final  . Total Protein 10/06/2013 8.1  6.0 - 8.3 g/dL Final  . Albumin 10/06/2013 3.6  3.5 - 5.2 g/dL Final  . AST 10/06/2013 17  0 - 37 U/L Final  .  ALT 10/06/2013 12  0 - 35 U/L Final  . Alkaline Phosphatase 10/06/2013 77  39 - 117 U/L Final  . Total Bilirubin 10/06/2013 0.2* 0.3 - 1.2 mg/dL Final  . GFR calc non Af Amer 10/06/2013 80* >90 mL/min Final  . GFR calc Af Amer 10/06/2013 >90  >90 mL/min Final   Comment: (NOTE)                          The eGFR has been calculated using the CKD EPI equation.                          This calculation has not been validated in all clinical situations.                          eGFR's persistently <90 mL/min signify possible Chronic Kidney                          Disease.  . CEA 10/06/2013 1.6  0.0 - 5.0 ng/mL Final   Performed at [facility]  . CA 27.29 10/06/2013 20  0 - 39 U/mL Final   Performed at [facility]: ER negative, HER-2/neu overexpressed breast cancer  Urinalysis    Component Value Date/Time   COLORURINE YELLOW 04/20/2010 [place] 04/20/2010 1123   LABSPEC 1.006 04/20/2010 1123   PHURINE 6.5 04/20/2010 1123   GLUCOSEU NEGATIVE 04/20/2010 [place] 04/20/2010 [place] 04/20/2010 [place] 04/20/2010 [place] 04/20/2010 1123   UROBILINOGEN 0.2 04/20/2010 1123   NITRITE NEGATIVE 04/20/2010 1123   LEUKOCYTESUR SMALL* 04/20/2010 1123    RADIOGRAPHIC STUDIES: Mm Digital Diagnostic Bilat  04/26/2014   CLINICAL DATA:  Patient status post left breast lumpectomy in 2011.  EXAM: DIGITAL DIAGNOSTIC  BILATERAL MAMMOGRAM WITH CAD  COMPARISON:  [facility]  ACR Breast Density Category c: The breast tissue is hetkerogeneously dense, which may obscure small masses.  FINDINGS: Stable post lumpectomy changes left breast. No concerning masses, calcifications or nonsurgical architectural distortion identified.  Mammographic images were processed with CAD.  IMPRESSION: No mammographic evidence for malignancy.  Stable postlumpectomy changes left breast.  RECOMMENDATION: Bilateral diagnostic mammography in 1 year.  I have discussed the findings and recommendations with the patient. Results were also provided in writing at the conclusion of the visit. If applicable, a reminder letter will be sent to the patient regarding the next appointment.  BI-RADS CATEGORY  2: Benign.   Electronically Signed   By: Lovey Newcomer M.D.   On: 04/26/2014 09:32    ASSESSMENT:  #1.Stage II (T2, N0) Invasive ductal carcinoma of the left breast diagnosed on 03/28/2010. She lumpectomy on 04/25/2010, her cancer was 2.3 cm in size with a negative sentinel lymph node. Her cancer was ER/PR negative, but HER-2/neu over amplified. Ki-67 marker was high at 80%, 3 lymph nodes were all negative, however LV I was found. She was treated with carboplatinum/Taxol and Herceptin, for a total of 52 weeks. Her last dose of trastuzumab with September,12, 2012, no evidence of disease.  #2. Diabetes mellitus, type II, non-insulin requiring, controlled.  #3. Hypertension, controlled.  #4 Hypothyroidism, on treatment.  #5. Chronic obstructive pulmonary disease.         PLAN:  #1. Flush life  port. Repeat every 6 weeks. #2. Followup in 6 months with CBC, chem profile, CEA, CA 27-29.   All questions were answered. The patient knows to call the clinic with any problems, questions or concerns. We can certainly see the patient much sooner if necessary.   I spent 25 minutes counseling the patient face to face. The total time spent in the appointment was 30 minutes.    Doroteo Bradford, MD 05/05/2014 12:11 PM  DISCLAIMER:  This note was dictated with voice recognition software.  Similar sounding words can inadvertently be transcribed inaccurately and may not be corrected upon review.

## 2014-05-05 NOTE — Patient Instructions (Signed)
[facility] Discharge Instructions  RECOMMENDATIONS MADE BY THE CONSULTANT AND ANY TEST RESULTS WILL BE SENT TO YOUR REFERRING PHYSICIAN.  EXAM FINDINGS BY THE PHYSICIAN TODAY AND SIGNS OR SYMPTOMS TO REPORT TO CLINIC OR PRIMARY PHYSICIAN: Exam and findings as discussed by Dr. Barnet Glasgow.  You are doing well from your breast cancer.  Report any new lumps, bone pain, shortness of breath or other symptoms.   INSTRUCTIONS/FOLLOW-UP: Port flushes every 6 weeks and office visit in 6 months.  Thank you for choosing [facility] to provide your oncology and hematology care.  To afford each patient quality time with our providers, please arrive at least 15 minutes before your scheduled appointment time.  With your help, our goal is to use those 15 minutes to complete the necessary work-up to ensure our physicians have the information they need to help with your evaluation and healthcare recommendations.    Effective January 1st, 2014, we ask that you re-schedule your appointment with our physicians should you arrive 10 or more minutes late for your appointment.  We strive to give you quality time with our providers, and arriving late affects you and other patients whose appointments are after yours.    Again, thank you for choosing [facility].  Our hope is that these requests will decrease the amount of time that you wait before being seen by our physicians.       _____________________________________________________________  Should you have questions after your visit to [facility], please contact our office at [phone] between the hours of 8:30 a.m. and 4:30 p.m.  Voicemails left after 4:30 p.m. will not be returned until the following business day.  For prescription refill requests, have your pharmacy contact our office with your prescription refill request.    _______________________________________________________________  We hope  that we have given you very good care.  You may receive a patient satisfaction survey in the mail, please complete it and return it as soon as possible.  We value your feedback!  _______________________________________________________________  Have you asked about our STAR program?  STAR stands for Survivorship Training and Rehabilitation, and this is a nationally recognized cancer care program that focuses on survivorship and rehabilitation.  Cancer and cancer treatments may cause problems, such as, pain, making you feel tired and keeping you from doing the things that you need or want to do. Cancer rehabilitation can help. Our goal is to reduce these troubling effects and help you have the best quality of life possible.  You may receive a survey from a nurse that asks questions about your current state of health.  Based on the survey results, all eligible patients will be referred to the [facility] program for an evaluation so we can better serve you!  A frequently asked questions sheet is available upon request.

## 2014-06-16 ENCOUNTER — Encounter (HOSPITAL_COMMUNITY): Payer: Medicare Other | Attending: Hematology and Oncology

## 2014-06-16 VITALS — BP 151/54 | HR 54 | Temp 98.0°F | Resp 20

## 2014-06-16 DIAGNOSIS — Z9889 Other specified postprocedural states: Secondary | ICD-10-CM | POA: Insufficient documentation

## 2014-06-16 DIAGNOSIS — Z95828 Presence of other vascular implants and grafts: Secondary | ICD-10-CM

## 2014-06-16 DIAGNOSIS — Z23 Encounter for immunization: Secondary | ICD-10-CM

## 2014-06-16 MED ORDER — HEPARIN SOD (PORK) LOCK FLUSH 100 UNIT/ML IV SOLN
500.0000 [IU] | Freq: Once | INTRAVENOUS | Status: AC
  Administered 2014-06-16: 500 [IU] via INTRAVENOUS

## 2014-06-16 MED ORDER — HEPARIN SOD (PORK) LOCK FLUSH 100 UNIT/ML IV SOLN
INTRAVENOUS | Status: AC
  Filled 2014-06-16: qty 5

## 2014-06-16 MED ORDER — INFLUENZA VAC SPLIT QUAD 0.5 ML IM SUSY
PREFILLED_SYRINGE | INTRAMUSCULAR | Status: AC
  Filled 2014-06-16: qty 0.5

## 2014-06-16 MED ORDER — INFLUENZA VAC SPLIT QUAD 0.5 ML IM SUSY
0.5000 mL | PREFILLED_SYRINGE | Freq: Once | INTRAMUSCULAR | Status: AC
  Administered 2014-06-16: 0.5 mL via INTRAMUSCULAR

## 2014-06-16 MED ORDER — SODIUM CHLORIDE 0.9 % IJ SOLN
10.0000 mL | Freq: Once | INTRAMUSCULAR | Status: AC
  Administered 2014-06-16: 10 mL via INTRAVENOUS

## 2014-06-16 NOTE — Patient Instructions (Signed)
[facility] Discharge Instructions  RECOMMENDATIONS MADE BY THE CONSULTANT AND ANY TEST RESULTS WILL BE SENT TO YOUR REFERRING PHYSICIAN.  INSTRUCTIONS/FOLLOW-UP: Fluarix (Flu vaccine) administered today. Port flush done today. Port flush every 6 weeks. Return as scheduled.  Thank you for choosing [place] to provide your oncology and hematology care.  To afford each patient quality time with our providers, please arrive at least 15 minutes before your scheduled appointment time.  With your help, our goal is to use those 15 minutes to complete the necessary work-up to ensure our physicians have the information they need to help with your evaluation and healthcare recommendations.    Effective January 1st, 2014, we ask that you re-schedule your appointment with our physicians should you arrive 10 or more minutes late for your appointment.  We strive to give you quality time with our providers, and arriving late affects you and other patients whose appointments are after yours.    Again, thank you for choosing [facility].  Our hope is that these requests will decrease the amount of time that you wait before being seen by our physicians.       _____________________________________________________________  Should you have questions after your visit to [facility], please contact our office at [phone] between the hours of 8:30 a.m. and 4:30 p.m.  Voicemails left after 4:30 p.m. will not be returned until the following business day.  For prescription refill requests, have your pharmacy contact our office with your prescription refill request.    _______________________________________________________________  We hope that we have given you very good care.  You may receive a patient satisfaction survey in the mail, please complete it and return it as soon as possible.  We value your  feedback!  _______________________________________________________________  Have you asked about our STAR program?  STAR stands for Survivorship Training and Rehabilitation, and this is a nationally recognized cancer care program that focuses on survivorship and rehabilitation.  Cancer and cancer treatments may cause problems, such as, pain, making you feel tired and keeping you from doing the things that you need or want to do. Cancer rehabilitation can help. Our goal is to reduce these troubling effects and help you have the best quality of life possible.  You may receive a survey from a nurse that asks questions about your current state of health.  Based on the survey results, all eligible patients will be referred to the [facility] program for an evaluation so we can better serve you!  A frequently asked questions sheet is available upon request.

## 2014-06-16 NOTE — Progress Notes (Signed)
Paige Rhodes presented for Portacath access and flush. Proper placement of portacath confirmed by CXR. Portacath located right chest wall accessed with  H 20 needle. Good blood return present. Portacath flushed with 20ml NS and 500U/5ml Heparin and needle removed intact. Procedure without incident. Patient tolerated procedure well.   

## 2014-07-27 ENCOUNTER — Encounter (HOSPITAL_COMMUNITY): Payer: Medicare Other | Attending: Hematology and Oncology

## 2014-07-27 ENCOUNTER — Encounter (HOSPITAL_COMMUNITY): Payer: Self-pay

## 2014-07-27 VITALS — BP 148/61 | HR 60 | Temp 97.9°F | Resp 20

## 2014-07-27 DIAGNOSIS — C50919 Malignant neoplasm of unspecified site of unspecified female breast: Secondary | ICD-10-CM | POA: Insufficient documentation

## 2014-07-27 DIAGNOSIS — Z452 Encounter for adjustment and management of vascular access device: Secondary | ICD-10-CM

## 2014-07-27 DIAGNOSIS — Z95828 Presence of other vascular implants and grafts: Secondary | ICD-10-CM

## 2014-07-27 LAB — CBC WITH DIFFERENTIAL/PLATELET
Basophils Absolute: 0 10*3/uL (ref 0.0–0.1)
Basophils Relative: 0 % (ref 0–1)
EOS ABS: 0.3 10*3/uL (ref 0.0–0.7)
Eosinophils Relative: 4 % (ref 0–5)
HCT: 33.9 % — ABNORMAL LOW (ref 36.0–46.0)
HEMOGLOBIN: 10.6 g/dL — AB (ref 12.0–15.0)
Lymphocytes Relative: 24 % (ref 12–46)
Lymphs Abs: 1.7 10*3/uL (ref 0.7–4.0)
MCH: 28.3 pg (ref 26.0–34.0)
MCHC: 31.3 g/dL (ref 30.0–36.0)
MCV: 90.6 fL (ref 78.0–100.0)
MONO ABS: 0.7 10*3/uL (ref 0.1–1.0)
MONOS PCT: 9 % (ref 3–12)
Neutro Abs: 4.5 10*3/uL (ref 1.7–7.7)
Neutrophils Relative %: 63 % (ref 43–77)
Platelets: 218 10*3/uL (ref 150–400)
RBC: 3.74 MIL/uL — ABNORMAL LOW (ref 3.87–5.11)
RDW: 12.8 % (ref 11.5–15.5)
WBC: 7.1 10*3/uL (ref 4.0–10.5)

## 2014-07-27 LAB — COMPREHENSIVE METABOLIC PANEL
ALT: 11 U/L (ref 0–35)
ANION GAP: 9 (ref 5–15)
AST: 19 U/L (ref 0–37)
Albumin: 3.8 g/dL (ref 3.5–5.2)
Alkaline Phosphatase: 68 U/L (ref 39–117)
BILIRUBIN TOTAL: 0.3 mg/dL (ref 0.3–1.2)
BUN: 27 mg/dL — AB (ref 6–23)
CHLORIDE: 98 meq/L (ref 96–112)
CO2: 32 mEq/L (ref 19–32)
CREATININE: 0.81 mg/dL (ref 0.50–1.10)
Calcium: 9.6 mg/dL (ref 8.4–10.5)
GFR calc Af Amer: 77 mL/min — ABNORMAL LOW (ref >90)
GFR calc non Af Amer: 67 mL/min — ABNORMAL LOW (ref >90)
Glucose, Bld: 116 mg/dL — ABNORMAL HIGH (ref 70–99)
Potassium: 4.5 mEq/L (ref 3.7–5.3)
Sodium: 139 mEq/L (ref 137–147)
TOTAL PROTEIN: 7.9 g/dL (ref 6.0–8.3)

## 2014-07-27 MED ORDER — HEPARIN SOD (PORK) LOCK FLUSH 100 UNIT/ML IV SOLN
INTRAVENOUS | Status: AC
  Filled 2014-07-27: qty 5

## 2014-07-27 MED ORDER — HEPARIN SOD (PORK) LOCK FLUSH 100 UNIT/ML IV SOLN
500.0000 [IU] | Freq: Once | INTRAVENOUS | Status: AC
  Administered 2014-07-27: 500 [IU] via INTRAVENOUS

## 2014-07-27 MED ORDER — SODIUM CHLORIDE 0.9 % IJ SOLN
10.0000 mL | INTRAMUSCULAR | Status: DC | PRN
  Administered 2014-07-27: 10 mL via INTRAVENOUS
  Filled 2014-07-27: qty 10

## 2014-07-27 NOTE — Patient Instructions (Signed)
[facility] Discharge Instructions  RECOMMENDATIONS MADE BY THE CONSULTANT AND ANY TEST RESULTS WILL BE SENT TO YOUR REFERRING PHYSICIAN.  Return as scheduled for port flushes and office visit.   Thank you for choosing [facility] to provide your oncology and hematology care.  To afford each patient quality time with our providers, please arrive at least 15 minutes before your scheduled appointment time.  With your help, our goal is to use those 15 minutes to complete the necessary work-up to ensure our physicians have the information they need to help with your evaluation and healthcare recommendations.    Effective January 1st, 2014, we ask that you re-schedule your appointment with our physicians should you arrive 10 or more minutes late for your appointment.  We strive to give you quality time with our providers, and arriving late affects you and other patients whose appointments are after yours.    Again, thank you for choosing [facility].  Our hope is that these requests will decrease the amount of time that you wait before being seen by our physicians.       _____________________________________________________________  Should you have questions after your visit to [facility], please contact our office at [phone] between the hours of 8:30 a.m. and 4:30 p.m.  Voicemails left after 4:30 p.m. will not be returned until the following business day.  For prescription refill requests, have your pharmacy contact our office with your prescription refill request.    _______________________________________________________________  We hope that we have given you very good care.  You may receive a patient satisfaction survey in the mail, please complete it and return it as soon as possible.  We value your feedback!  _______________________________________________________________  Have you asked about our STAR program?  STAR stands  for Survivorship Training and Rehabilitation, and this is a nationally recognized cancer care program that focuses on survivorship and rehabilitation.  Cancer and cancer treatments may cause problems, such as, pain, making you feel tired and keeping you from doing the things that you need or want to do. Cancer rehabilitation can help. Our goal is to reduce these troubling effects and help you have the best quality of life possible.  You may receive a survey from a nurse that asks questions about your current state of health.  Based on the survey results, all eligible patients will be referred to the [facility] program for an evaluation so we can better serve you!  A frequently asked questions sheet is available upon request.

## 2014-07-27 NOTE — Progress Notes (Signed)
Loyal Gambler presented for Portacath access and flush.  Portacath located right chest wall accessed with  H 20 needle.  Good blood return present. Labs drawn from site. Portacath flushed with 15ml NS and 500U/14ml Heparin and needle removed intact.  Procedure tolerated well and without incident.

## 2014-07-28 LAB — CANCER ANTIGEN 27.29: CA 27.29: 20 U/mL (ref 0–39)

## 2014-07-28 LAB — CEA: CEA: 1.8 ng/mL (ref 0.0–5.0)

## 2014-09-07 ENCOUNTER — Encounter (HOSPITAL_COMMUNITY): Payer: Medicare Other | Attending: Hematology and Oncology

## 2014-09-07 DIAGNOSIS — C50412 Malignant neoplasm of upper-outer quadrant of left female breast: Secondary | ICD-10-CM

## 2014-09-07 DIAGNOSIS — Z95828 Presence of other vascular implants and grafts: Secondary | ICD-10-CM

## 2014-09-07 DIAGNOSIS — Z9889 Other specified postprocedural states: Secondary | ICD-10-CM | POA: Insufficient documentation

## 2014-09-07 DIAGNOSIS — Z452 Encounter for adjustment and management of vascular access device: Secondary | ICD-10-CM

## 2014-09-07 MED ORDER — HEPARIN SOD (PORK) LOCK FLUSH 100 UNIT/ML IV SOLN
500.0000 [IU] | Freq: Once | INTRAVENOUS | Status: AC
  Administered 2014-09-07: 500 [IU] via INTRAVENOUS
  Filled 2014-09-07: qty 5

## 2014-09-07 MED ORDER — SODIUM CHLORIDE 0.9 % IJ SOLN
10.0000 mL | Freq: Once | INTRAMUSCULAR | Status: AC
  Administered 2014-09-07: 10 mL via INTRAVENOUS

## 2014-09-07 NOTE — Patient Instructions (Signed)
[facility] Discharge Instructions  RECOMMENDATIONS MADE BY THE CONSULTANT AND ANY TEST RESULTS WILL BE SENT TO YOUR REFERRING PHYSICIAN.  INSTRUCTIONS/FOLLOW-UP: Port flush today. Continue port flush every 6-8 weeks. Return as scheduled.  Thank you for choosing [facility] to provide your oncology and hematology care.  To afford each patient quality time with our providers, please arrive at least 15 minutes before your scheduled appointment time.  With your help, our goal is to use those 15 minutes to complete the necessary work-up to ensure our physicians have the information they need to help with your evaluation and healthcare recommendations.    Effective January 1st, 2014, we ask that you re-schedule your appointment with our physicians should you arrive 10 or more minutes late for your appointment.  We strive to give you quality time with our providers, and arriving late affects you and other patients whose appointments are after yours.    Again, thank you for choosing [facility].  Our hope is that these requests will decrease the amount of time that you wait before being seen by our physicians.       _____________________________________________________________  Should you have questions after your visit to [facility], please contact our office at [phone] between the hours of 8:30 a.m. and 4:30 p.m.  Voicemails left after 4:30 p.m. will not be returned until the following business day.  For prescription refill requests, have your pharmacy contact our office with your prescription refill request.    _______________________________________________________________  We hope that we have given you very good care.  You may receive a patient satisfaction survey in the mail, please complete it and return it as soon as possible.  We value your feedback!  _______________________________________________________________  Have  you asked about our STAR program?  STAR stands for Survivorship Training and Rehabilitation, and this is a nationally recognized cancer care program that focuses on survivorship and rehabilitation.  Cancer and cancer treatments may cause problems, such as, pain, making you feel tired and keeping you from doing the things that you need or want to do. Cancer rehabilitation can help. Our goal is to reduce these troubling effects and help you have the best quality of life possible.  You may receive a survey from a nurse that asks questions about your current state of health.  Based on the survey results, all eligible patients will be referred to the [facility] program for an evaluation so we can better serve you!  A frequently asked questions sheet is available upon request.

## 2014-09-07 NOTE — Progress Notes (Signed)
Loyal Gambler presented for Portacath access and flush. Proper placement of portacath confirmed by CXR. Portacath located right chest wall accessed with  H 20 needle. Good blood return present. Portacath flushed with 49ml NS and 500U/35ml Heparin and needle removed intact. Procedure without incident. Patient tolerated procedure well.

## 2014-09-08 ENCOUNTER — Encounter (HOSPITAL_COMMUNITY): Payer: Medicare Other

## 2014-10-20 ENCOUNTER — Encounter (HOSPITAL_COMMUNITY): Payer: Medicare Other

## 2014-10-20 ENCOUNTER — Ambulatory Visit (HOSPITAL_COMMUNITY): Payer: Medicare Other | Admitting: Hematology & Oncology

## 2014-10-27 ENCOUNTER — Ambulatory Visit (HOSPITAL_COMMUNITY): Payer: Medicare Other | Admitting: Hematology & Oncology

## 2014-10-27 ENCOUNTER — Encounter (HOSPITAL_COMMUNITY): Payer: Medicare Other

## 2014-10-30 NOTE — Assessment & Plan Note (Addendum)
Stage II (T2, N0) Invasive ductal carcinoma of the left breast diagnosed on 03/28/2010. She lumpectomy on 04/25/2010, her cancer was 2.3 cm in size with a negative sentinel lymph node. Her cancer was ER/PR negative, but HER-2/neu over amplified. Ki-67 marker was high at 80%, 3 lymph nodes were all negative, however LV I was found. She was treated with carboplatin/Taxol x 4 cycles and Herceptin, for a total of 52 weeks. Her last dose of trastuzumab with May 15, 2011, no evidence of disease. Finished radiation therapy to the L breast with a L breast boost by Dr. Pablo Ledger from 08/16/2010-09/18/2010.  Mammogram in 04/2014 is BIRADS 2 and she will be due again this coming August for repeat mammogram. We will get a copy of 1 years worth of labs from her primary care provider, Dr. [name].  No need from an oncology standpoint for labs today.   We will continue to follow surveillance per NCCN guidelines.  In 2017, we can start seeing the patient annually.  She is nearly 5 years out from therapy and she is advised that seh can have her port removed at any time.  Her surgeon was Dr. Osborn Coho who is retired.  She is interested in having her port removed locally by Dr. Arnoldo Morale.   Return in 6 months for follow-up after mammogram.  Order placed for mammogram.

## 2014-10-30 NOTE — Progress Notes (Signed)
Paige Noble, MD [address] [place] [address]  Infiltrating ductal carcinoma of breast, left - Plan: CBC with Differential, Comprehensive metabolic panel, MM Digital Screening, Schedule Portacath Flush Appointment, heparin lock flush 100 unit/mL, sodium chloride 0.9 % injection 10 mL  Chronic renal disease, stage 2, mildly decreased glomerular filtration rate between 60-89 mL/min/1.73 square meter  CURRENT THERAPY: Surveillance per NCCN guidelines  INTERVAL HISTORY: Paige Rhodes 81 y.o. female returns for followup of Stage II (T2, N0) Invasive ductal carcinoma of the left breast diagnosed on 03/28/2010. She lumpectomy on 04/25/2010, her cancer was 2.3 cm in size with a negative sentinel lymph node. Her cancer was ER/PR negative, but HER-2/neu over amplified. Ki-67 marker was high at 80%, 3 lymph nodes were all negative, however LV I was found. She was treated with carboplatin/Taxol x 4 cycles and Herceptin, for a total of 52 weeks. Her last dose of trastuzumab with May 15, 2011, no evidence of disease. Finished radiation therapy to the L breast with a L breast boost by Dr. Pablo Ledger from 08/16/2010-09/18/2010.  I personally reviewed and went over laboratory results with the patient.  The results are noted within this dictation.  I personally reviewed and went over radiographic studies with the patient.  The results are noted within this dictation.  Mammogram in 04/2014 demonstrates a BIRADS 2.  She reports that she has labs performed by her primary care provider, Dr. [name], periodically and annually.  We will try to get a copy of those labs.  From an oncology standpoint, there is no need for labs.  Anemia is mild and stable and not in need of intervention at this time and dating back to at least 2011.    She notes an intermittent pruritis of her left breast that resolves on its own.  There is no abnormality on exam.  This is likely neuropathic in  nature.  Oncologically, she is well without any complaints and negative ROS questioning is negative.    Past Medical History  Diagnosis Date  . Diabetes mellitus   . Hypertension   . Hyperlipidemia   . Thyroid disease   . Osteoporosis   . COPD (chronic obstructive pulmonary disease)   . SOB (shortness of breath)   . Port catheter in place 01/15/2012  . Gout attack june 2013    left foot  . Glaucoma   . Hypothyroidism   . Breast cancer   . Infiltrating ductal carcinoma of left breast 03/05/2011  . Chronic renal disease, stage 2, mildly decreased glomerular filtration rate between 60-89 mL/min/1.73 square meter 10/31/2014    has Infiltrating ductal carcinoma of left breast; Port catheter in place; and Chronic renal disease, stage 2, mildly decreased glomerular filtration rate between 60-89 mL/min/1.73 square meter on her problem list.     is allergic to penicillins; hctz; morphine and related; and lotrel.  [name] does not currently have medications on file.  Past Surgical History  Procedure Laterality Date  . Appendectomy  1955  . Cholecystectomy  1990  . Thyroid surgery  1994  . Total hip arthroplasty  1996  . Ankle surgery  1998  . Breast lumpectomy  2011    left  . Tonsillectomy    . Portacath placement  05/23/10  . Cataract extraction w/phaco  09/03/2012    Procedure: CATARACT EXTRACTION PHACO AND INTRAOCULAR LENS PLACEMENT (IOC);  Surgeon: Tonny Branch, MD;  Location: [facility];  Service: Ophthalmology;  Laterality: Right;  CDE:  15.26  . Cataract extraction w/phaco  09/28/2012    Procedure: CATARACT EXTRACTION PHACO AND INTRAOCULAR LENS PLACEMENT (IOC);  Surgeon: Tonny Branch, MD;  Location: [facility];  Service: Ophthalmology;  Laterality: Left;  CDE:  21.60    Denies any headaches, dizziness, double vision, fevers, chills, night sweats, nausea, vomiting, diarrhea, constipation, chest pain, heart palpitations, shortness of breath, blood in stool, black tarry stool, urinary pain,  urinary burning, urinary frequency, hematuria.   PHYSICAL EXAMINATION  ECOG PERFORMANCE STATUS: 1 - Symptomatic but completely ambulatory  Filed Vitals:   10/31/14 1339  BP: 144/62  Pulse: 60  Temp: 98.4 F (36.9 C)  Resp: 20    GENERAL:alert, no distress, well nourished, well developed, comfortable, cooperative, obese and smiling SKIN: skin color, texture, turgor are normal, no rashes or significant lesions HEAD: Normocephalic, No masses, lesions, tenderness or abnormalities EYES: normal, PERRLA, EOMI, Conjunctiva are pink and non-injected EARS: External ears normal OROPHARYNX:mucous membranes are moist  NECK: supple, thyroid normal size, non-tender, without nodularity, no stridor, non-tender, trachea midline LYMPH:  no palpable lymphadenopathy, no hepatosplenomegaly BREAST:right breast normal without mass, skin or nipple changes or axillary nodes, left post-lumpectomy site well healed and free of suspicious changes without any rashes or masses. LUNGS: clear to auscultation and percussion, decreased breath sounds HEART: regular rate & rhythm, no murmurs, no gallops, S1 normal and S2 normal ABDOMEN:abdomen soft, non-tender, obese, normal bowel sounds, no masses or organomegaly and no hepatosplenomegaly BACK: Back symmetric, no curvature., No CVA tenderness EXTREMITIES:less then 2 second capillary refill, no joint deformities, effusion, or inflammation, no edema, no skin discoloration. NEURO: alert & oriented x 3 with fluent speech, no focal motor/sensory deficits, gait normal  LABORATORY DATA: CBC    Component Value Date/Time   WBC 7.1 07/27/2014 1152   RBC 3.74* 07/27/2014 1152   HGB 10.6* 07/27/2014 1152   HCT 33.9* 07/27/2014 1152   PLT 218 07/27/2014 1152   MCV 90.6 07/27/2014 1152   MCH 28.3 07/27/2014 1152   MCHC 31.3 07/27/2014 1152   RDW 12.8 07/27/2014 1152   LYMPHSABS 1.7 07/27/2014 1152   MONOABS 0.7 07/27/2014 1152   EOSABS 0.3 07/27/2014 1152   BASOSABS 0.0  07/27/2014 1152      Chemistry      Component Value Date/Time   NA 139 07/27/2014 1152   K 4.5 07/27/2014 1152   CL 98 07/27/2014 1152   CO2 32 07/27/2014 1152   BUN 27* 07/27/2014 1152   CREATININE 0.81 07/27/2014 1152      Component Value Date/Time   CALCIUM 9.6 07/27/2014 1152   ALKPHOS 68 07/27/2014 1152   AST 19 07/27/2014 1152   ALT 11 07/27/2014 1152   BILITOT 0.3 07/27/2014 1152       RADIOGRAPHIC STUDIES:  04/26/2014  CLINICAL DATA: Patient status post left breast lumpectomy in 2011.  EXAM: DIGITAL DIAGNOSTIC BILATERAL MAMMOGRAM WITH CAD  COMPARISON: [facility]  ACR Breast Density Category c: The breast tissue is heterogeneously dense, which may obscure small masses.  FINDINGS: Stable post lumpectomy changes left breast. No concerning masses, calcifications or nonsurgical architectural distortion identified.  Mammographic images were processed with CAD.  IMPRESSION: No mammographic evidence for malignancy.  Stable postlumpectomy changes left breast.  RECOMMENDATION: Bilateral diagnostic mammography in 1 year.  I have discussed the findings and recommendations with the patient. Results were also provided in writing at the conclusion of the visit. If applicable, a reminder letter will be sent to the patient regarding the next appointment.  BI-RADS CATEGORY 2: Benign.   Electronically Signed  By: Lovey Newcomer M.D.  On: 04/26/2014 09:32   ASSESSMENT AND PLAN:  Infiltrating ductal carcinoma of left breast Stage II (T2, N0) Invasive ductal carcinoma of the left breast diagnosed on 03/28/2010. She lumpectomy on 04/25/2010, her cancer was 2.3 cm in size with a negative sentinel lymph node. Her cancer was ER/PR negative, but HER-2/neu over amplified. Ki-67 marker was high at 80%, 3 lymph nodes were all negative, however LV I was found. She was treated with carboplatin/Taxol x 4 cycles and Herceptin, for a total of 52 weeks. Her last dose  of trastuzumab with May 15, 2011, no evidence of disease. Finished radiation therapy to the L breast with a L breast boost by Dr. Pablo Ledger from 08/16/2010-09/18/2010.  Mammogram in 04/2014 is BIRADS 2 and she will be due again this coming August for repeat mammogram. We will get a copy of 1 years worth of labs from her primary care provider, Dr. [name].  No need from an oncology standpoint for labs today.   We will continue to follow surveillance per NCCN guidelines.  In 2017, we can start seeing the patient annually.  She is nearly 5 years out from therapy and she is advised that seh can have her port removed at any time.  Her surgeon was Dr. Osborn Coho who is retired.  She is interested in having her port removed locally by Dr. Arnoldo Morale.   Return in 6 months for follow-up after mammogram.  Order placed for mammogram.    THERAPY PLAN:  NCCN guidelines recommends the following surveillance for invasive breast cancer:  A. History and Physical exam every 4-6 months for 5 years and then every 12 months.  B. Mammography every 12 months  C. Women on Tamoxifen: annual gynecologic assessment every 12 months if uterus is present.  D. Women on aromatase inhibitor or who experience ovarian failure secondary to treatment should have monitoring of bone health with a bone mineral density determination at baseline and periodically thereafter.  E. Assess and encourage adherence to adjuvant endocrine therapy.  F. Evidence suggests that active lifestyle and achieving and maintaining an ideal body weight (20-25 BMI) may lead to optimal breast cancer outcomes.   All questions were answered. The patient knows to call the clinic with any problems, questions or concerns. We can certainly see the patient much sooner if necessary.  Patient and plan discussed with Dr. Ancil Linsey and she is in agreement with the aforementioned.   This note is electronically signed by: Robynn Pane 10/31/2014 2:05 PM

## 2014-10-31 ENCOUNTER — Encounter (HOSPITAL_COMMUNITY): Payer: Medicare Other | Attending: Oncology | Admitting: Oncology

## 2014-10-31 ENCOUNTER — Encounter (HOSPITAL_COMMUNITY): Payer: Medicare Other | Attending: Hematology and Oncology

## 2014-10-31 ENCOUNTER — Encounter (HOSPITAL_COMMUNITY): Payer: Self-pay | Admitting: Oncology

## 2014-10-31 ENCOUNTER — Other Ambulatory Visit (HOSPITAL_COMMUNITY): Payer: Self-pay | Admitting: Hematology & Oncology

## 2014-10-31 VITALS — BP 144/62 | HR 60 | Temp 98.4°F | Resp 20 | Wt 168.9 lb

## 2014-10-31 DIAGNOSIS — Z853 Personal history of malignant neoplasm of breast: Secondary | ICD-10-CM

## 2014-10-31 DIAGNOSIS — Z9889 Other specified postprocedural states: Secondary | ICD-10-CM | POA: Insufficient documentation

## 2014-10-31 DIAGNOSIS — N182 Chronic kidney disease, stage 2 (mild): Secondary | ICD-10-CM

## 2014-10-31 DIAGNOSIS — C50912 Malignant neoplasm of unspecified site of left female breast: Secondary | ICD-10-CM

## 2014-10-31 DIAGNOSIS — Z1231 Encounter for screening mammogram for malignant neoplasm of breast: Secondary | ICD-10-CM

## 2014-10-31 HISTORY — DX: Chronic kidney disease, stage 2 (mild): N18.2

## 2014-10-31 MED ORDER — SODIUM CHLORIDE 0.9 % IJ SOLN
10.0000 mL | INTRAMUSCULAR | Status: DC | PRN
  Administered 2014-10-31: 10 mL via INTRAVENOUS
  Filled 2014-10-31: qty 10

## 2014-10-31 MED ORDER — HEPARIN SOD (PORK) LOCK FLUSH 100 UNIT/ML IV SOLN
500.0000 [IU] | Freq: Once | INTRAVENOUS | Status: AC
  Administered 2014-10-31: 500 [IU] via INTRAVENOUS
  Filled 2014-10-31: qty 5

## 2014-10-31 NOTE — Progress Notes (Signed)
Please see doctors encounter for more information 

## 2014-10-31 NOTE — Patient Instructions (Signed)
[place] at [facility] Discharge Instructions  RECOMMENDATIONS MADE BY THE CONSULTANT AND ANY TEST RESULTS WILL BE SENT TO YOUR REFERRING PHYSICIAN.  Exam and discussion by Robynn Pane, PA-C. You are doing well.  It's ok for you to get your port out. Report any new lumps, bone pain, shortness of breath or other symptoms.  Port flushes every 6 weeks Office visit in 6 months.  Thank you for choosing [place] at [facility] to provide your oncology and hematology care.  To afford each patient quality time with our provider, please arrive at least 15 minutes before your scheduled appointment time.    You need to re-schedule your appointment should you arrive 10 or more minutes late.  We strive to give you quality time with our providers, and arriving late affects you and other patients whose appointments are after yours.  Also, if you no show three or more times for appointments you may be dismissed from the clinic at the providers discretion.     Again, thank you for choosing [facility].  Our hope is that these requests will decrease the amount of time that you wait before being seen by our physicians.       _____________________________________________________________  Should you have questions after your visit to [facility], please contact our office at [phone] between the hours of 8:30 a.m. and 4:30 p.m.  Voicemails left after 4:30 p.m. will not be returned until the following business day.  For prescription refill requests, have your pharmacy contact our office.

## 2014-12-12 ENCOUNTER — Encounter (HOSPITAL_COMMUNITY): Payer: Self-pay

## 2014-12-12 ENCOUNTER — Encounter (HOSPITAL_COMMUNITY): Payer: Medicare Other | Attending: Hematology and Oncology

## 2014-12-12 VITALS — BP 157/67 | HR 63 | Temp 97.8°F | Resp 20

## 2014-12-12 DIAGNOSIS — Z452 Encounter for adjustment and management of vascular access device: Secondary | ICD-10-CM | POA: Diagnosis not present

## 2014-12-12 DIAGNOSIS — Z853 Personal history of malignant neoplasm of breast: Secondary | ICD-10-CM

## 2014-12-12 DIAGNOSIS — Z9889 Other specified postprocedural states: Secondary | ICD-10-CM | POA: Insufficient documentation

## 2014-12-12 DIAGNOSIS — Z95828 Presence of other vascular implants and grafts: Secondary | ICD-10-CM

## 2014-12-12 MED ORDER — HEPARIN SOD (PORK) LOCK FLUSH 100 UNIT/ML IV SOLN
500.0000 [IU] | Freq: Once | INTRAVENOUS | Status: AC
  Administered 2014-12-12: 500 [IU] via INTRAVENOUS
  Filled 2014-12-12: qty 5

## 2014-12-12 MED ORDER — SODIUM CHLORIDE 0.9 % IJ SOLN
10.0000 mL | INTRAMUSCULAR | Status: DC | PRN
  Administered 2014-12-12: 10 mL via INTRAVENOUS
  Filled 2014-12-12: qty 10

## 2014-12-12 NOTE — Patient Instructions (Signed)
[place] at [facility]  Discharge Instructions:  You had your port flushed today.  Please return as scheduled to have your port flushed.  Call the clinic if you have any questions or concerns _______________________________________________________________  Thank you for choosing [place] at [facility] to provide your oncology and hematology care.  To afford each patient quality time with our providers, please arrive at least 15 minutes before your scheduled appointment.  You need to re-schedule your appointment if you arrive 10 or more minutes late.  We strive to give you quality time with our providers, and arriving late affects you and other patients whose appointments are after yours.  Also, if you no show three or more times for appointments you may be dismissed from the clinic.  Again, thank you for choosing [place] at [place] hope is that these requests will allow you access to exceptional care and in a timely manner. _______________________________________________________________  If you have questions after your visit, please contact our office at [phone] between the hours of 8:30 a.m. and 5:00 p.m. Voicemails left after 4:30 p.m. will not be returned until the following business day. _______________________________________________________________  For prescription refill requests, have your pharmacy contact our office. _______________________________________________________________  Recommendations made by the consultant and any test results will be sent to your referring physician. _______________________________________________________________

## 2014-12-12 NOTE — Progress Notes (Signed)
Paige Rhodes presented for Portacath access and flush.  Proper placement of portacath confirmed by CXR.  Portacath located right chest wall accessed with  H 20 needle.  Good blood return present. Portacath flushed with 109ml NS and 500U/70ml Heparin and needle removed intact.  Procedure tolerated well and without incident.

## 2015-01-23 ENCOUNTER — Encounter (HOSPITAL_COMMUNITY): Payer: Self-pay

## 2015-01-23 ENCOUNTER — Encounter (HOSPITAL_COMMUNITY): Payer: Medicare Other | Attending: Hematology & Oncology

## 2015-01-23 DIAGNOSIS — Z452 Encounter for adjustment and management of vascular access device: Secondary | ICD-10-CM | POA: Diagnosis not present

## 2015-01-23 DIAGNOSIS — Z853 Personal history of malignant neoplasm of breast: Secondary | ICD-10-CM

## 2015-01-23 MED ORDER — SODIUM CHLORIDE 0.9 % IJ SOLN
10.0000 mL | INTRAMUSCULAR | Status: DC | PRN
  Administered 2015-01-23: 10 mL via INTRAVENOUS
  Filled 2015-01-23: qty 10

## 2015-01-23 MED ORDER — HEPARIN SOD (PORK) LOCK FLUSH 100 UNIT/ML IV SOLN
500.0000 [IU] | Freq: Once | INTRAVENOUS | Status: AC
  Administered 2015-01-23: 500 [IU] via INTRAVENOUS

## 2015-01-23 NOTE — Patient Instructions (Signed)
[place] at [facility] Discharge Instructions  RECOMMENDATIONS MADE BY THE CONSULTANT AND ANY TEST RESULTS WILL BE SENT TO YOUR REFERRING PHYSICIAN.  Port flush today Return as scheduled Please call the clinic if you have any questions or concerns  Thank you for choosing [place] at [facility] to provide your oncology and hematology care.  To afford each patient quality time with our provider, please arrive at least 15 minutes before your scheduled appointment time.    You need to re-schedule your appointment should you arrive 10 or more minutes late.  We strive to give you quality time with our providers, and arriving late affects you and other patients whose appointments are after yours.  Also, if you no show three or more times for appointments you may be dismissed from the clinic at the providers discretion.     Again, thank you for choosing [facility].  Our hope is that these requests will decrease the amount of time that you wait before being seen by our physicians.       _____________________________________________________________  Should you have questions after your visit to [facility], please contact our office at [phone] between the hours of 8:30 a.m. and 4:30 p.m.  Voicemails left after 4:30 p.m. will not be returned until the following business day.  For prescription refill requests, have your pharmacy contact our office.

## 2015-01-23 NOTE — Progress Notes (Signed)
Paige Rhodes presented for Portacath access and flush.  Portacath located right chest wall accessed with  H 20 needle.  Good blood return present. Portacath flushed with 37ml NS and 500U/104ml Heparin and needle removed intact.  Procedure tolerated well and without incident.

## 2015-03-07 ENCOUNTER — Encounter (HOSPITAL_COMMUNITY): Payer: Self-pay

## 2015-03-07 ENCOUNTER — Encounter (HOSPITAL_COMMUNITY): Payer: Medicare Other | Attending: Hematology & Oncology

## 2015-03-07 DIAGNOSIS — Z95828 Presence of other vascular implants and grafts: Secondary | ICD-10-CM

## 2015-03-07 DIAGNOSIS — Z452 Encounter for adjustment and management of vascular access device: Secondary | ICD-10-CM

## 2015-03-07 DIAGNOSIS — Z853 Personal history of malignant neoplasm of breast: Secondary | ICD-10-CM | POA: Diagnosis not present

## 2015-03-07 MED ORDER — SODIUM CHLORIDE 0.9 % IJ SOLN
10.0000 mL | INTRAMUSCULAR | Status: DC | PRN
  Administered 2015-03-07: 10 mL via INTRAVENOUS
  Filled 2015-03-07: qty 10

## 2015-03-07 MED ORDER — HEPARIN SOD (PORK) LOCK FLUSH 100 UNIT/ML IV SOLN
500.0000 [IU] | Freq: Once | INTRAVENOUS | Status: AC
  Administered 2015-03-07: 500 [IU] via INTRAVENOUS
  Filled 2015-03-07: qty 5

## 2015-03-07 NOTE — Progress Notes (Signed)
..  Loyal Gambler presented for Portacath access and flush.   Portacath located rt chest wall accessed with  H 20 needle.  Good blood return present. Portacath flushed with 54ml NS and 500U/64ml Heparin and needle removed intact.  Procedure tolerated well and without incident.

## 2015-04-19 NOTE — H&P (Signed)
  NTS SOAP Note  Vital Signs:  Vitals as of: 6/64/4034: Systolic 742: Diastolic 64: Heart Rate 57: Temp 96.6F: Height 53ft 2.5in: Weight 168Lbs 0 Ounces: BMI 30.24  BMI : 30.24 kg/m2  Subjective: This 81 year old female presents for of need for portacath removal.  Review of Symptoms:  Constitutional:unremarkable   Head:unremarkable Eyes:unremarkable   Nose/Mouth/Throat:unremarkable Cardiovascular:  unremarkable Respiratory:dyspnea Gastrointestinal:  unremarkable   Genitourinary:unremarkable   Musculoskeletal:unremarkable Skin:unremarkable Hematolgic/Lymphatic:unremarkable   Allergic/Immunologic:unremarkable   Past Medical History:  Reviewed  Past Medical History  Surgical History: appendectomy, cholecystectomy, lumpectomy, hip replacement, thyroid surgery Medical Problems: breast cancer, diabetes, thyroid disease, HTN, high cholesterol Allergies: latex, pcn, HCTZ, morphine Medications: coreg, fosamax, cozaar, synthroid, lipitor, norvasc, baby asa   Social History:Reviewed  Social History  Preferred Language: English Race:  White Ethnicity: Not Hispanic / Latino Age: 89 year Marital Status:  M Alcohol: no   Smoking Status: Unknown if ever smoked reviewed on 04/18/2015 Functional Status reviewed on 04/18/2015 ------------------------------------------------ Bathing: Normal Cooking: Normal Dressing: Normal Driving: Normal Eating: Normal Managing Meds: Normal Oral Care: Normal Shopping: Normal Toileting: Normal Transferring: Normal Walking: Normal Cognitive Status reviewed on 04/18/2015 ------------------------------------------------ Attention: Normal Decision Making: Normal Language: Normal Memory: Normal Motor: Normal Perception: Normal Problem Solving: Normal Visual and Spatial: Normal   Family History:Reviewed  Family Health History Family History is Unknown    Objective Information: General:Well appearing, well  nourished in no distress. Portacath in place right upper chest Heart:RRR, no murmur Lungs:  CTA bilaterally, no wheezes, rhonchi, rales.  Breathing unlabored.  Assessment:Breast cancer, finished with chemotherapy  Diagnoses: V10.3  Z85.3 Personal history of primary malignant neoplasm of breast (Personal history of malignant neoplasm of breast)  Procedures: 59563 - OFFICE OUTPATIENT NEW 20 MINUTES    Plan:  Scheduled for portacath removal on 04/26/15 in minor procedure room.   Patient Education:Alternative treatments to surgery were discussed with patient (and family).  Risks and benefits  of procedure were fully explained to the patient (and family) who gave informed consent. Patient/family questions were addressed.  Follow-up:Pending Surgery

## 2015-04-26 ENCOUNTER — Encounter (HOSPITAL_COMMUNITY): Payer: Self-pay | Admitting: *Deleted

## 2015-04-26 ENCOUNTER — Encounter (HOSPITAL_COMMUNITY)
Admission: RE | Disposition: A | Discharge status: Home / Self Care | Payer: Self-pay | Source: Ambulatory Visit | Attending: General Surgery

## 2015-04-26 ENCOUNTER — Ambulatory Visit (HOSPITAL_COMMUNITY)
Admission: RE | Admit: 2015-04-26 | Discharge: 2015-04-26 | Disposition: A | Discharge status: Home / Self Care | Payer: Medicare Other | Source: Ambulatory Visit | Attending: General Surgery | Admitting: General Surgery

## 2015-04-26 DIAGNOSIS — Z96649 Presence of unspecified artificial hip joint: Secondary | ICD-10-CM | POA: Diagnosis not present

## 2015-04-26 DIAGNOSIS — I10 Essential (primary) hypertension: Secondary | ICD-10-CM | POA: Insufficient documentation

## 2015-04-26 DIAGNOSIS — Z452 Encounter for adjustment and management of vascular access device: Secondary | ICD-10-CM | POA: Insufficient documentation

## 2015-04-26 DIAGNOSIS — Z79899 Other long term (current) drug therapy: Secondary | ICD-10-CM | POA: Insufficient documentation

## 2015-04-26 DIAGNOSIS — E780 Pure hypercholesterolemia: Secondary | ICD-10-CM | POA: Diagnosis not present

## 2015-04-26 DIAGNOSIS — E079 Disorder of thyroid, unspecified: Secondary | ICD-10-CM | POA: Diagnosis not present

## 2015-04-26 DIAGNOSIS — Z7982 Long term (current) use of aspirin: Secondary | ICD-10-CM | POA: Insufficient documentation

## 2015-04-26 DIAGNOSIS — E119 Type 2 diabetes mellitus without complications: Secondary | ICD-10-CM | POA: Insufficient documentation

## 2015-04-26 HISTORY — PX: PORT-A-CATH REMOVAL: SHX5289

## 2015-04-26 SURGERY — REMOVAL PORT-A-CATH
Anesthesia: LOCAL

## 2015-04-26 MED ORDER — LIDOCAINE HCL (PF) 1 % IJ SOLN
INTRAMUSCULAR | Status: AC
  Filled 2015-04-26: qty 5

## 2015-04-26 MED ORDER — HYDROCODONE-ACETAMINOPHEN 5-325 MG PO TABS: 1.0000 | ORAL_TABLET | Freq: Four times a day (QID) | ORAL | Status: AC | PRN

## 2015-04-26 MED ORDER — LIDOCAINE HCL (PF) 1 % IJ SOLN
INTRAMUSCULAR | Status: AC
  Filled 2015-04-26: qty 30

## 2015-04-26 MED ORDER — LIDOCAINE HCL (PF) 1 % IJ SOLN
INTRAMUSCULAR | Status: DC | PRN
  Administered 2015-04-26: 5 mL

## 2015-04-26 SURGICAL SUPPLY — 12 items
CLOTH BEACON ORANGE TIMEOUT ST (SAFETY) ×2 IMPLANT
DRAPE PROXIMA HALF (DRAPES) ×2 IMPLANT
DURAPREP 6ML APPLICATOR 50/CS (WOUND CARE) ×2 IMPLANT
ELECT REM PT RETURN 9FT ADLT (ELECTROSURGICAL) ×2
ELECTRODE REM PT RTRN 9FT ADLT (ELECTROSURGICAL) ×1 IMPLANT
GLOVE SURG SS PI 7.5 STRL IVOR (GLOVE) ×2 IMPLANT
GOWN STRL REUS W/TWL LRG LVL3 (GOWN DISPOSABLE) ×2 IMPLANT
LIQUID BAND (GAUZE/BANDAGES/DRESSINGS) ×2 IMPLANT
SPONGE GAUZE 2X2 8PLY STRL LF (GAUZE/BANDAGES/DRESSINGS) ×2 IMPLANT
SUT VIC AB 3-0 SH 27 (SUTURE) ×2
SUT VIC AB 3-0 SH 27X BRD (SUTURE) ×1 IMPLANT
SUT VIC AB 4-0 PS2 27 (SUTURE) ×2 IMPLANT

## 2015-04-26 NOTE — Op Note (Signed)
Patient:  Paige Rhodes  DOB:  April 21, 1932  MRN:  [id number]   Preop Diagnosis:  Breast carcinoma, finished with chemotherapy  Postop Diagnosis:  Same  Procedure:  Port-A-Cath removal  Surgeon:  Aviva Signs, M.D.  Anes:  Local  Indications:  Patient is an 81 year old white female who presents for a Port-A-Cath removal. She is finished with chemotherapy. The risks and benefits of the procedure were fully explained to the patient, who gave informed consent.  Procedure note:  The patient was placed the supine position. The right upper chest was prepped and draped using the usual sterile technique with DuraPrep. Surgical site confirmation was performed. 1% Xylocaine was used for local anesthesia.  An incision was made to the previous surgical incision site. Dissection was taken down to the port. The port was removed in total without difficulty. It was disposed of. The subcutaneous layer was reapproximated using 3-0 Vicryl interrupted sutures. The skin was closed using 4-0 Vicryl subcuticular suture. Liquiband was applied.  All tape and needle counts were correct at the end of the procedure. The patient was discharged from the minor procedure room in good and stable condition.  Complications:  None  EBL:  Minimal  Specimen:  None

## 2015-04-26 NOTE — Interval H&P Note (Signed)
History and Physical Interval Note:  04/26/2015 9:32 AM  Paige Rhodes  has presented today for surgery, with the diagnosis of right breast cancer  The various methods of treatment have been discussed with the patient and family. After consideration of risks, benefits and other options for treatment, the patient has consented to  Procedure(s): REMOVAL PORT-A-CATH (N/A) as a surgical intervention .  The patient's history has been reviewed, patient examined, no change in status, stable for surgery.  I have reviewed the patient's chart and labs.  Questions were answered to the patient's satisfaction.     Aviva Signs A

## 2015-04-27 ENCOUNTER — Encounter (HOSPITAL_COMMUNITY): Payer: Self-pay | Admitting: General Surgery

## 2015-05-01 ENCOUNTER — Ambulatory Visit (HOSPITAL_COMMUNITY): Payer: Medicare Other | Admitting: Hematology & Oncology

## 2015-05-01 ENCOUNTER — Other Ambulatory Visit (HOSPITAL_COMMUNITY): Payer: Medicare Other

## 2015-05-02 ENCOUNTER — Ambulatory Visit (HOSPITAL_COMMUNITY)
Admission: RE | Admit: 2015-05-02 | Discharge: 2015-05-02 | Disposition: A | Discharge status: Home / Self Care | Payer: Medicare Other | Source: Ambulatory Visit | Attending: Hematology & Oncology | Admitting: Hematology & Oncology

## 2015-05-02 ENCOUNTER — Other Ambulatory Visit (HOSPITAL_COMMUNITY): Payer: Self-pay | Admitting: Hematology & Oncology

## 2015-05-02 DIAGNOSIS — Z1231 Encounter for screening mammogram for malignant neoplasm of breast: Secondary | ICD-10-CM

## 2015-05-02 DIAGNOSIS — Z853 Personal history of malignant neoplasm of breast: Secondary | ICD-10-CM | POA: Diagnosis present

## 2015-05-03 ENCOUNTER — Encounter (HOSPITAL_COMMUNITY): Payer: Medicare Other

## 2015-05-03 ENCOUNTER — Ambulatory Visit (HOSPITAL_COMMUNITY): Payer: Medicare Other | Admitting: Oncology

## 2015-05-03 ENCOUNTER — Encounter (HOSPITAL_COMMUNITY): Payer: Self-pay | Admitting: Oncology

## 2015-05-03 ENCOUNTER — Ambulatory Visit (HOSPITAL_COMMUNITY): Payer: Medicare Other | Admitting: Hematology & Oncology

## 2015-05-03 ENCOUNTER — Encounter (HOSPITAL_COMMUNITY): Payer: Medicare Other | Attending: Oncology | Admitting: Oncology

## 2015-05-03 ENCOUNTER — Other Ambulatory Visit (HOSPITAL_COMMUNITY): Payer: Medicare Other

## 2015-05-03 VITALS — BP 140/67 | HR 56 | Temp 98.3°F | Resp 18 | Wt 168.6 lb

## 2015-05-03 DIAGNOSIS — C50412 Malignant neoplasm of upper-outer quadrant of left female breast: Secondary | ICD-10-CM | POA: Diagnosis not present

## 2015-05-03 DIAGNOSIS — C50912 Malignant neoplasm of unspecified site of left female breast: Secondary | ICD-10-CM

## 2015-05-03 NOTE — Patient Instructions (Addendum)
[place] at [facility] Discharge Instructions  RECOMMENDATIONS MADE BY THE CONSULTANT AND ANY TEST RESULTS WILL BE SENT TO YOUR REFERRING PHYSICIAN.   Exam completed by Kirby Crigler  Return in 6 months to see the doctor  Please call the clinic if you have any questions or concerns    Thank you for choosing [place] at [facility] to provide your oncology and hematology care.  To afford each patient quality time with our provider, please arrive at least 15 minutes before your scheduled appointment time.    You need to re-schedule your appointment should you arrive 10 or more minutes late.  We strive to give you quality time with our providers, and arriving late affects you and other patients whose appointments are after yours.  Also, if you no show three or more times for appointments you may be dismissed from the clinic at the providers discretion.     Again, thank you for choosing [facility].  Our hope is that these requests will decrease the amount of time that you wait before being seen by our physicians.       _____________________________________________________________  Should you have questions after your visit to [facility], please contact our office at [phone] between the hours of 8:30 a.m. and 4:30 p.m.  Voicemails left after 4:30 p.m. will not be returned until the following business day.  For prescription refill requests, have your pharmacy contact our office.

## 2015-05-03 NOTE — Assessment & Plan Note (Addendum)
Stage II (T2, N0) Invasive ductal carcinoma of the left breast diagnosed on 03/28/2010. She lumpectomy on 04/25/2010, her cancer was 2.3 cm in size with a negative sentinel lymph node. Her cancer was ER/PR negative, but HER-2/neu over amplified. Ki-67 marker was high at 80%, 3 lymph nodes were all negative, however LV I was found. She was treated with carboplatin/Taxol x 4 cycles and Herceptin, for a total of 52 weeks. Her last dose of trastuzumab with May 15, 2011, no evidence of disease. Finished radiation therapy to the L breast with a L breast boost by Dr. Pablo Ledger from 08/16/2010-09/18/2010.    Mammogram in 04/2015 is BIRADS 2 and she will be due again August 2017 for repeat mammogram.   Labs reviewed from Dr. Ria Comment office. No need from an oncology standpoint for labs today.     Her port was removed on 04/26/2015 by Dr. Arnoldo Morale.    Return in 6 months for follow-up.  If all is well at that time, she will be 5 years out from all therapy and it would be reasonable to see her back on an annual basis.  We discussed this, and she is agreeable to this plan of action moving forward.

## 2015-05-03 NOTE — Progress Notes (Signed)
Paige Rhodes, [address] [place] [address]  Infiltrating ductal carcinoma of breast, left  CURRENT THERAPY: Surveillance per NCCN guidelines  INTERVAL HISTORY: Paige Rhodes 81 y.o. female returns for followup of Stage II (T2, N0) Invasive ductal carcinoma of the left breast diagnosed on 03/28/2010. She lumpectomy on 04/25/2010, her cancer was 2.3 cm in size with a negative sentinel lymph node. Her cancer was ER/PR negative, but HER-2/neu over amplified. Ki-67 marker was high at 80%, 3 lymph nodes were all negative, however LV I was found. She was treated with carboplatin/Taxol x 4 cycles and Herceptin, for a total of 52 weeks. Her last dose of trastuzumab with May 15, 2011, no evidence of disease. Finished radiation therapy to the L breast with a L breast boost by Dr. Pablo Ledger from 08/16/2010-09/18/2010.  I personally reviewed and went over laboratory results with the patient.  These were completed by her primary care provider, Dr. [name] and I have reviewed them.  I personally reviewed and went over radiographic studies with the patient.  The results are noted within this dictation.  Mammogram in 04/2015 demonstrates a BIRADS 2.  She denies any complaints and is doing very well.    Past Medical History  Diagnosis Date  . Diabetes mellitus   . Hypertension   . Hyperlipidemia   . Thyroid disease   . Osteoporosis   . COPD (chronic obstructive pulmonary disease)   . SOB (shortness of breath)   . Port catheter in place 01/15/2012  . Gout attack june 2013    left foot  . Glaucoma   . Hypothyroidism   . Breast cancer   . Infiltrating ductal carcinoma of left breast 03/05/2011  . Chronic renal disease, stage 2, mildly decreased glomerular filtration rate between 60-89 mL/min/1.73 square meter 10/31/2014    has Infiltrating ductal carcinoma of left breast; Port catheter in place; and Chronic renal disease, stage 2, mildly decreased glomerular filtration  rate between 60-89 mL/min/1.73 square meter on her problem list.     is allergic to penicillins; hctz; morphine and related; and lotrel.  Ms. Shryock does not currently have medications on file.  Past Surgical History  Procedure Laterality Date  . Appendectomy  1955  . Cholecystectomy  1990  . Thyroid surgery  1994  . Total hip arthroplasty  1996  . Ankle surgery  1998  . Breast lumpectomy  2011    left  . Tonsillectomy    . Portacath placement  05/23/10  . Cataract extraction w/phaco  09/03/2012    Procedure: CATARACT EXTRACTION PHACO AND INTRAOCULAR LENS PLACEMENT (IOC);  Surgeon: Tonny Branch, MD;  Location: [facility];  Service: Ophthalmology;  Laterality: Right;  CDE: 15.26  . Cataract extraction w/phaco  09/28/2012    Procedure: CATARACT EXTRACTION PHACO AND INTRAOCULAR LENS PLACEMENT (IOC);  Surgeon: Tonny Branch, MD;  Location: [facility];  Service: Ophthalmology;  Laterality: Left;  CDE:  21.60  . Port-a-cath removal N/A 04/26/2015    Procedure: REMOVAL PORT-A-CATH;  Surgeon: Aviva Signs, MD;  Location: [facility];  Service: General;  Laterality: N/A;    Denies any headaches, dizziness, double vision, fevers, chills, night sweats, nausea, vomiting, diarrhea, constipation, chest pain, heart palpitations, shortness of breath, blood in stool, black tarry stool, urinary pain, urinary burning, urinary frequency, hematuria.   PHYSICAL EXAMINATION  ECOG PERFORMANCE STATUS: 1 - Symptomatic but completely ambulatory  Filed Vitals:   05/03/15 1401  BP: 140/67  Pulse: 56  Temp:  98.3 F (36.8 C)  Resp: 18    GENERAL:alert, no distress, well nourished, well developed, comfortable, cooperative, smiling, and accompanied by her daughter. SKIN: skin color, texture, turgor are normal, no rashes or significant lesions HEAD: Normocephalic, No masses, lesions, tenderness or abnormalities EYES: normal, PERRLA, EOMI, Conjunctiva are pink and non-injected EARS: External ears normal OROPHARYNX:mucous  membranes are moist  NECK: supple, thyroid normal size, non-tender, without nodularity, no stridor, non-tender, trachea midline LYMPH:  Not examined BREAST: deferred in light of mammogram being performed yesterday. LUNGS: clear to auscultation and percussion, decreased breath sounds HEART: regular rate & rhythm, no murmurs, no gallops, S1 normal and S2 normal ABDOMEN:abdomen soft, non-tender, obese, normal bowel sounds BACK: Back symmetric, no curvature., No CVA tenderness EXTREMITIES:less then 2 second capillary refill, no joint deformities, effusion, or inflammation, no edema, no skin discoloration. NEURO: alert & oriented x 3 with fluent speech, no focal motor/sensory deficits, gait normal  LABORATORY DATA: CBC    Component Value Date/Time   WBC 7.1 07/27/2014 1152   RBC 3.74* 07/27/2014 1152   HGB 10.6* 07/27/2014 1152   HCT 33.9* 07/27/2014 1152   PLT 218 07/27/2014 1152   MCV 90.6 07/27/2014 1152   MCH 28.3 07/27/2014 1152   MCHC 31.3 07/27/2014 1152   RDW 12.8 07/27/2014 1152   LYMPHSABS 1.7 07/27/2014 1152   MONOABS 0.7 07/27/2014 1152   EOSABS 0.3 07/27/2014 1152   BASOSABS 0.0 07/27/2014 1152      Chemistry      Component Value Date/Time   NA 139 07/27/2014 1152   K 4.5 07/27/2014 1152   CL 98 07/27/2014 1152   CO2 32 07/27/2014 1152   BUN 27* 07/27/2014 1152   CREATININE 0.81 07/27/2014 1152      Component Value Date/Time   CALCIUM 9.6 07/27/2014 1152   ALKPHOS 68 07/27/2014 1152   AST 19 07/27/2014 1152   ALT 11 07/27/2014 1152   BILITOT 0.3 07/27/2014 1152       RADIOGRAPHIC STUDIES:  CLINICAL DATA: Annual examination. History of left breast cancer, status post lumpectomy in 2011. Images are slightly degraded by patient's respiratory motion. She is on oxygen.  EXAM: DIGITAL DIAGNOSTIC BILATERAL MAMMOGRAM WITH CAD  COMPARISON: Previous exam(s).  ACR Breast Density Category c: The breast tissue is heterogeneously dense, which may obscure  small masses.  FINDINGS: There are stable lumpectomy changes in the deep outer left breast. Benign calcifications consistent with fat necrosis are seen at the lumpectomy site. No suspicious microcalcifications, mass, or nonsurgical distortion is identified in either breast to suggest malignancy. The patient has left breast skin thickening, which is unchanged to priors, and likely due to prior radiation therapy.  Mammographic images were processed with CAD.  IMPRESSION: No evidence of malignancy in either breast. Lumpectomy changes on the left.  RECOMMENDATION: Diagnostic mammogram is suggested in 1 year. (Code:DM-B-01Y)  I have discussed the findings and recommendations with the patient. Results were also provided in writing at the conclusion of the visit. If applicable, a reminder letter will be sent to the patient regarding the next appointment.  BI-RADS CATEGORY 2: Benign.   Electronically Signed  By: Curlene Dolphin M.D.  On: 05/02/2015 11:46  ASSESSMENT AND PLAN:  Infiltrating ductal carcinoma of left breast Stage II (T2, N0) Invasive ductal carcinoma of the left breast diagnosed on 03/28/2010. She lumpectomy on 04/25/2010, her cancer was 2.3 cm in size with a negative sentinel lymph node. Her cancer was ER/PR negative, but HER-2/neu over amplified. Ki-67 marker  was high at 80%, 3 lymph nodes were all negative, however LV I was found. She was treated with carboplatin/Taxol x 4 cycles and Herceptin, for a total of 52 weeks. Her last dose of trastuzumab with May 15, 2011, no evidence of disease. Finished radiation therapy to the L breast with a L breast boost by Dr. Pablo Ledger from 08/16/2010-09/18/2010.    Mammogram in 04/2015 is BIRADS 2 and she will be due again August 2017 for repeat mammogram.   Labs reviewed from Dr. Ria Comment office. No need from an oncology standpoint for labs today.     Her port was removed on 04/26/2015 by Dr. Arnoldo Morale.    Return in 6  months for follow-up.     THERAPY PLAN:  NCCN guidelines recommends the following surveillance for invasive breast cancer:  A. History and Physical exam every 4-6 months for 5 years and then every 12 months.  B. Mammography every 12 months  C. Women on Tamoxifen: annual gynecologic assessment every 12 months if uterus is present.  D. Women on aromatase inhibitor or who experience ovarian failure secondary to treatment should have monitoring of bone health with a bone mineral density determination at baseline and periodically thereafter.  E. Assess and encourage adherence to adjuvant endocrine therapy.  F. Evidence suggests that active lifestyle and achieving and maintaining an ideal body weight (20-25 BMI) may lead to optimal breast cancer outcomes.   All questions were answered. The patient knows to call the clinic with any problems, questions or concerns. We can certainly see the patient much sooner if necessary.  Patient and plan discussed with Dr. Ancil Linsey and she is in agreement with the aforementioned.   This note is electronically signed by: Robynn Pane 05/03/2015 2:23 PM

## 2015-06-21 ENCOUNTER — Telehealth (HOSPITAL_COMMUNITY): Payer: Self-pay | Admitting: *Deleted

## 2015-09-05 DIAGNOSIS — E119 Type 2 diabetes mellitus without complications: Secondary | ICD-10-CM | POA: Diagnosis not present

## 2015-09-05 DIAGNOSIS — E039 Hypothyroidism, unspecified: Secondary | ICD-10-CM | POA: Diagnosis not present

## 2015-09-07 DIAGNOSIS — J449 Chronic obstructive pulmonary disease, unspecified: Secondary | ICD-10-CM | POA: Diagnosis not present

## 2015-10-08 DIAGNOSIS — J449 Chronic obstructive pulmonary disease, unspecified: Secondary | ICD-10-CM | POA: Diagnosis not present

## 2015-10-24 DIAGNOSIS — E039 Hypothyroidism, unspecified: Secondary | ICD-10-CM | POA: Diagnosis not present

## 2015-10-24 DIAGNOSIS — J441 Chronic obstructive pulmonary disease with (acute) exacerbation: Secondary | ICD-10-CM | POA: Diagnosis not present

## 2015-10-24 DIAGNOSIS — E119 Type 2 diabetes mellitus without complications: Secondary | ICD-10-CM | POA: Diagnosis not present

## 2015-10-30 ENCOUNTER — Ambulatory Visit (HOSPITAL_COMMUNITY): Payer: Medicare Other | Admitting: Oncology

## 2015-10-30 ENCOUNTER — Ambulatory Visit (HOSPITAL_COMMUNITY): Payer: Medicare Other | Admitting: Hematology & Oncology

## 2015-11-05 DIAGNOSIS — J449 Chronic obstructive pulmonary disease, unspecified: Secondary | ICD-10-CM | POA: Diagnosis not present

## 2015-11-05 NOTE — Assessment & Plan Note (Signed)
Stage II (T2, N0) Invasive ductal carcinoma of the left breast diagnosed on 03/28/2010. She lumpectomy on 04/25/2010, her cancer was 2.3 cm in size with a negative sentinel lymph node. Her cancer was ER/PR negative, but HER-2/neu over amplified. Ki-67 marker was high at 80%, 3 lymph nodes were all negative, however LV I was found. She was treated with carboplatin/Taxol x 4 cycles and Herceptin, for a total of 52 weeks. Her last dose of trastuzumab with May 15, 2011, no evidence of disease. Finished radiation therapy to the L breast with a L breast boost by Dr. Pablo Ledger from 08/16/2010-09/18/2010.    Mammogram in 04/2015 is BIRADS 2 and she will be due again August 2017 for repeat mammogram.   No role for labs today    Her port was removed on 04/26/2015 by Dr. Arnoldo Morale.    Return in 12 months for follow-up with continued annual mammogram every June.

## 2015-11-05 NOTE — Progress Notes (Signed)
Paige Noble, MD [address] [place] [address]  Infiltrating ductal carcinoma of breast, left [facility])  CURRENT THERAPY: Surveillance per NCCN guidelines  INTERVAL HISTORY: Paige Rhodes 82 y.o. female returns for followup of Stage II (T2, N0) Invasive ductal carcinoma of the left breast diagnosed on 03/28/2010. She lumpectomy on 04/25/2010, her cancer was 2.3 cm in size with a negative sentinel lymph node. Her cancer was ER/PR negative, but HER-2/neu over amplified. Ki-67 marker was high at 80%, 3 lymph nodes were all negative, however LV I was found. She was treated with carboplatin/Taxol x 4 cycles and Herceptin, for a total of 52 weeks. Her last dose of trastuzumab with May 15, 2011, no evidence of disease. Finished radiation therapy to the L breast with a L breast boost by Dr. Pablo Ledger from 08/16/2010-09/18/2010.  I personally reviewed and went over laboratory results with the patient.  The results are noted within this dictation. She had laboratory work done by Dr. [name], her primary care physician in January. We'll try to ascertain these records.  She is doing very well.  I personally reviewed and went over radiographic studies with the patient.  The results are noted within this dictation.  Mammogram in 04/2015 demonstrates a BIRADS 2.   She denies any complaints and is doing very well.    Past Medical History  Diagnosis Date  . Diabetes mellitus   . Hypertension   . Hyperlipidemia   . Thyroid disease   . Osteoporosis   . COPD (chronic obstructive pulmonary disease) ([place])   . SOB (shortness of breath)   . Port catheter in place 01/15/2012  . Gout attack june 2013    left foot  . Glaucoma   . Hypothyroidism   . Breast cancer ([place])   . Infiltrating ductal carcinoma of left breast 03/05/2011  . Chronic renal disease, stage 2, mildly decreased glomerular filtration rate between 60-89 mL/min/1.73 square meter 10/31/2014    has Infiltrating ductal  carcinoma of left breast; Port catheter in place; and Chronic renal disease, stage 2, mildly decreased glomerular filtration rate between 60-89 mL/min/1.73 square meter on her problem list.     is allergic to penicillins; hctz; morphine and related; and lotrel.  Ms. Register does not currently have medications on file.  Past Surgical History  Procedure Laterality Date  . Appendectomy  1955  . Cholecystectomy  1990  . Thyroid surgery  1994  . Total hip arthroplasty  1996  . Ankle surgery  1998  . Breast lumpectomy  2011    left  . Tonsillectomy    . Portacath placement  05/23/10  . Cataract extraction w/phaco  09/03/2012    Procedure: CATARACT EXTRACTION PHACO AND INTRAOCULAR LENS PLACEMENT (IOC);  Surgeon: Tonny Branch, MD;  Location: [facility];  Service: Ophthalmology;  Laterality: Right;  CDE: 15.26  . Cataract extraction w/phaco  09/28/2012    Procedure: CATARACT EXTRACTION PHACO AND INTRAOCULAR LENS PLACEMENT (IOC);  Surgeon: Tonny Branch, MD;  Location: [facility];  Service: Ophthalmology;  Laterality: Left;  CDE:  21.60  . Port-a-cath removal N/A 04/26/2015    Procedure: REMOVAL PORT-A-CATH;  Surgeon: Aviva Signs, MD;  Location: [facility];  Service: General;  Laterality: N/A;    Denies any headaches, dizziness, double vision, fevers, chills, night sweats, nausea, vomiting, diarrhea, constipation, chest pain, heart palpitations, shortness of breath, blood in stool, black tarry stool, urinary pain, urinary burning, urinary frequency, hematuria.   PHYSICAL EXAMINATION  ECOG PERFORMANCE  STATUS: 1 - Symptomatic but completely ambulatory  Filed Vitals:   11/06/15 1426  BP: 154/74  Pulse: 56  Temp: 97.9 F (36.6 C)  Resp: 20    GENERAL:alert, no distress, well nourished, well developed, comfortable, cooperative, smiling, Nasal cannula in place for oxygen delivery, and accompanied by her daughter. SKIN: skin color, texture, turgor are normal, no rashes or significant lesions HEAD: Normocephalic,  No masses, lesions, tenderness or abnormalities EYES: normal, PERRLA, EOMI, Conjunctiva are pink and non-injected EARS: External ears normal OROPHARYNX:mucous membranes are moist  NECK: supple, thyroid normal size, non-tender, without nodularity, no stridor, non-tender, trachea midline LYMPH:  Not examined BREAST: not examined LUNGS: clear to auscultation and percussion, decreased breath sounds bilaterally. HEART: regular rate & rhythm, no murmurs, no gallops, S1 normal and S2 normal ABDOMEN:abdomen soft, non-tender, obese, normal bowel sounds BACK: Back symmetric, no curvature., No CVA tenderness EXTREMITIES:less then 2 second capillary refill, no joint deformities, effusion, or inflammation, no edema, no skin discoloration. NEURO: alert & oriented x 3 with fluent speech, no focal motor/sensory deficits, gait normal  LABORATORY DATA: CBC    Component Value Date/Time   WBC 7.1 07/27/2014 1152   RBC 3.74* 07/27/2014 1152   HGB 10.6* 07/27/2014 1152   HCT 33.9* 07/27/2014 1152   PLT 218 07/27/2014 1152   MCV 90.6 07/27/2014 1152   MCH 28.3 07/27/2014 1152   MCHC 31.3 07/27/2014 1152   RDW 12.8 07/27/2014 1152   LYMPHSABS 1.7 07/27/2014 1152   MONOABS 0.7 07/27/2014 1152   EOSABS 0.3 07/27/2014 1152   BASOSABS 0.0 07/27/2014 1152      Chemistry      Component Value Date/Time   NA 139 07/27/2014 1152   K 4.5 07/27/2014 1152   CL 98 07/27/2014 1152   CO2 32 07/27/2014 1152   BUN 27* 07/27/2014 1152   CREATININE 0.81 07/27/2014 1152      Component Value Date/Time   CALCIUM 9.6 07/27/2014 1152   ALKPHOS 68 07/27/2014 1152   AST 19 07/27/2014 1152   ALT 11 07/27/2014 1152   BILITOT 0.3 07/27/2014 1152       RADIOGRAPHIC STUDIES:  CLINICAL DATA: Annual examination. History of left breast cancer, status post lumpectomy in 2011. Images are slightly degraded by patient's respiratory motion. She is on oxygen.  EXAM: DIGITAL DIAGNOSTIC BILATERAL MAMMOGRAM WITH  CAD  COMPARISON: Previous exam(s).  ACR Breast Density Category c: The breast tissue is heterogeneously dense, which may obscure small masses.  FINDINGS: There are stable lumpectomy changes in the deep outer left breast. Benign calcifications consistent with fat necrosis are seen at the lumpectomy site. No suspicious microcalcifications, mass, or nonsurgical distortion is identified in either breast to suggest malignancy. The patient has left breast skin thickening, which is unchanged to priors, and likely due to prior radiation therapy.  Mammographic images were processed with CAD.  IMPRESSION: No evidence of malignancy in either breast. Lumpectomy changes on the left.  RECOMMENDATION: Diagnostic mammogram is suggested in 1 year. (Code:DM-B-01Y)  I have discussed the findings and recommendations with the patient. Results were also provided in writing at the conclusion of the visit. If applicable, a reminder letter will be sent to the patient regarding the next appointment.  BI-RADS CATEGORY 2: Benign.   Electronically Signed  By: Curlene Dolphin M.D.  On: 05/02/2015 11:46  ASSESSMENT AND PLAN:  Infiltrating ductal carcinoma of left breast Stage II (T2, N0) Invasive ductal carcinoma of the left breast diagnosed on 03/28/2010. She lumpectomy on 04/25/2010,  her cancer was 2.3 cm in size with a negative sentinel lymph node. Her cancer was ER/PR negative, but HER-2/neu over amplified. Ki-67 marker was high at 80%, 3 lymph nodes were all negative, however LV I was found. She was treated with carboplatin/Taxol x 4 cycles and Herceptin, for a total of 52 weeks. Her last dose of trastuzumab with May 15, 2011, no evidence of disease. Finished radiation therapy to the L breast with a L breast boost by Dr. Pablo Ledger from 08/16/2010-09/18/2010.    Mammogram in 04/2015 is BIRADS 2 and she will be due again August 2017 for repeat mammogram.   No role for labs today     Her port was removed on 04/26/2015 by Dr. Arnoldo Morale.    Return in 12 months for follow-up with continued annual mammogram every June.   THERAPY PLAN:  NCCN guidelines recommends the following surveillance for invasive breast cancer (2.2016):  A. History and Physical exam 1-4 times per year as clinically appropriate for 5 years, then annually.  B. Periodic screening for changes in family history and referral to genetics counseling as indicated  C. Educate, monitor, and refer to lymphedema management.  D. Mammography every 12 months  E. Routine imaging of reconstructed breast is not indicated.  F. In the absence of clinical signs and symptoms suggestive of recurrent disease, there is no indication for laboratory or imaging studies for metastases screening.  G. Women on Tamoxifen: annual gynecologic assessment every 12 months if uterus is present.  H. Women on aromatase inhibitor or who experience ovarian failure secondary to treatment should have monitoring of bone health with a bone mineral density determination at baseline and periodically thereafter.  I. Assess and encourage adherence to adjuvant endocrine therapy.  J. Evidence suggests that active lifestyle, healthy diet, limited alcohol intake, and achieving and maintaining an ideal body weight (20-25 BMI) may lead to optimal breast cancer outcomes.   All questions were answered. The patient knows to call the clinic with any problems, questions or concerns. We can certainly see the patient much sooner if necessary.  Patient and plan discussed with Dr. Ancil Linsey and she is in agreement with the aforementioned.   This note is electronically signed by: Robynn Pane 11/06/2015 4:35 PM

## 2015-11-06 ENCOUNTER — Encounter (HOSPITAL_COMMUNITY): Payer: Medicare Other | Attending: Oncology | Admitting: Oncology

## 2015-11-06 ENCOUNTER — Encounter (HOSPITAL_COMMUNITY): Payer: Self-pay | Admitting: Oncology

## 2015-11-06 VITALS — BP 154/74 | HR 56 | Temp 97.9°F | Resp 20 | Wt 173.0 lb

## 2015-11-06 DIAGNOSIS — C50912 Malignant neoplasm of unspecified site of left female breast: Secondary | ICD-10-CM

## 2015-11-06 DIAGNOSIS — Z853 Personal history of malignant neoplasm of breast: Secondary | ICD-10-CM

## 2015-11-06 NOTE — Patient Instructions (Signed)
[facility] Discharge Instructions  RECOMMENDATIONS MADE BY THE CONSULTANT AND ANY TEST RESULTS WILL BE SENT TO YOUR REFERRING PHYSICIAN.  Exam and discussion today with Kirby Crigler, PA.  Thank you for choosing [facility] to provide your oncology and hematology care.  To afford each patient quality time with our provider, please arrive at least 15 minutes before your scheduled appointment time.   Beginning January 23rd 2017 lab work for the [facility] will be done in the  Main lab at [facility] on 1st floor. If you have a lab appointment with the [facility] please come in thru the  Main Entrance and check in at the main information desk  You need to re-schedule your appointment should you arrive 10 or more minutes late.  We strive to give you quality time with our providers, and arriving late affects you and other patients whose appointments are after yours.  Also, if you no show three or more times for appointments you may be dismissed from the clinic at the providers discretion.     Again, thank you for choosing [facility].  Our hope is that these requests will decrease the amount of time that you wait before being seen by our physicians.       _____________________________________________________________  Should you have questions after your visit to [facility], please contact our office at [phone] between the hours of 8:30 a.m. and 4:30 p.m.  Voicemails left after 4:30 p.m. will not be returned until the following business day.  For prescription refill requests, have your pharmacy contact our office.         Resources For Cancer Patients and their Caregivers ? American Cancer Society: Can assist with transportation, wigs, general needs, runs Look Good Feel Better.        351-490-5808 ? Cancer Care: Provides financial assistance, online support groups,  medication/co-pay assistance.  1-800-813-HOPE (231)811-9292) ? [facility] Assists [place] cancer patients and their families through emotional , educational and financial support.  (360) 828-6279 ? [place] DSS Where to apply for food stamps, Medicaid and utility assistance. (867) 029-9879 ? [facility]: Transportation to medical appointments. 812-071-2123 ? Social Security Administration: May apply for disability if have a Stage IV cancer. 843-269-3144 (651)051-3779 ? [employer], Disability and Transit Services: Assists with nutrition, care and transit needs. ([phone] 716-7349

## 2015-12-06 DIAGNOSIS — J449 Chronic obstructive pulmonary disease, unspecified: Secondary | ICD-10-CM | POA: Diagnosis not present

## 2015-12-13 DIAGNOSIS — H401131 Primary open-angle glaucoma, bilateral, mild stage: Secondary | ICD-10-CM | POA: Diagnosis not present

## 2016-01-05 DIAGNOSIS — J449 Chronic obstructive pulmonary disease, unspecified: Secondary | ICD-10-CM | POA: Diagnosis not present

## 2016-02-01 ENCOUNTER — Ambulatory Visit (INDEPENDENT_AMBULATORY_CARE_PROVIDER_SITE_OTHER): Payer: Medicare Other | Admitting: Otolaryngology

## 2016-02-01 DIAGNOSIS — H903 Sensorineural hearing loss, bilateral: Secondary | ICD-10-CM

## 2016-02-01 DIAGNOSIS — H6121 Impacted cerumen, right ear: Secondary | ICD-10-CM | POA: Diagnosis not present

## 2016-02-05 DIAGNOSIS — J449 Chronic obstructive pulmonary disease, unspecified: Secondary | ICD-10-CM | POA: Diagnosis not present

## 2016-02-13 DIAGNOSIS — E119 Type 2 diabetes mellitus without complications: Secondary | ICD-10-CM | POA: Diagnosis not present

## 2016-02-20 DIAGNOSIS — I10 Essential (primary) hypertension: Secondary | ICD-10-CM | POA: Diagnosis not present

## 2016-02-20 DIAGNOSIS — J441 Chronic obstructive pulmonary disease with (acute) exacerbation: Secondary | ICD-10-CM | POA: Diagnosis not present

## 2016-02-20 DIAGNOSIS — E119 Type 2 diabetes mellitus without complications: Secondary | ICD-10-CM | POA: Diagnosis not present

## 2016-03-06 DIAGNOSIS — J449 Chronic obstructive pulmonary disease, unspecified: Secondary | ICD-10-CM | POA: Diagnosis not present

## 2016-03-25 ENCOUNTER — Other Ambulatory Visit (HOSPITAL_COMMUNITY): Payer: Self-pay | Admitting: Internal Medicine

## 2016-03-25 DIAGNOSIS — Z1231 Encounter for screening mammogram for malignant neoplasm of breast: Secondary | ICD-10-CM

## 2016-04-06 DIAGNOSIS — J449 Chronic obstructive pulmonary disease, unspecified: Secondary | ICD-10-CM | POA: Diagnosis not present

## 2016-05-07 DIAGNOSIS — Z79899 Other long term (current) drug therapy: Secondary | ICD-10-CM | POA: Diagnosis not present

## 2016-05-07 DIAGNOSIS — M109 Gout, unspecified: Secondary | ICD-10-CM | POA: Diagnosis not present

## 2016-05-07 DIAGNOSIS — E119 Type 2 diabetes mellitus without complications: Secondary | ICD-10-CM | POA: Diagnosis not present

## 2016-05-07 DIAGNOSIS — E039 Hypothyroidism, unspecified: Secondary | ICD-10-CM | POA: Diagnosis not present

## 2016-05-07 DIAGNOSIS — J449 Chronic obstructive pulmonary disease, unspecified: Secondary | ICD-10-CM | POA: Diagnosis not present

## 2016-05-13 ENCOUNTER — Other Ambulatory Visit (HOSPITAL_COMMUNITY): Payer: Self-pay | Admitting: Internal Medicine

## 2016-05-13 DIAGNOSIS — I10 Essential (primary) hypertension: Secondary | ICD-10-CM | POA: Diagnosis not present

## 2016-05-13 DIAGNOSIS — J441 Chronic obstructive pulmonary disease with (acute) exacerbation: Secondary | ICD-10-CM | POA: Diagnosis not present

## 2016-05-13 DIAGNOSIS — Z780 Asymptomatic menopausal state: Secondary | ICD-10-CM

## 2016-05-13 DIAGNOSIS — E1129 Type 2 diabetes mellitus with other diabetic kidney complication: Secondary | ICD-10-CM | POA: Diagnosis not present

## 2016-05-13 DIAGNOSIS — Z23 Encounter for immunization: Secondary | ICD-10-CM | POA: Diagnosis not present

## 2016-05-13 DIAGNOSIS — E785 Hyperlipidemia, unspecified: Secondary | ICD-10-CM | POA: Diagnosis not present

## 2016-05-14 ENCOUNTER — Other Ambulatory Visit (HOSPITAL_COMMUNITY): Payer: Self-pay | Admitting: Internal Medicine

## 2016-05-14 DIAGNOSIS — Z9889 Other specified postprocedural states: Secondary | ICD-10-CM

## 2016-05-20 ENCOUNTER — Other Ambulatory Visit (HOSPITAL_COMMUNITY): Payer: Medicare Other

## 2016-05-20 ENCOUNTER — Ambulatory Visit (HOSPITAL_COMMUNITY): Payer: Medicare Other

## 2016-05-21 ENCOUNTER — Ambulatory Visit (HOSPITAL_COMMUNITY)
Admission: RE | Admit: 2016-05-21 | Discharge: 2016-05-21 | Disposition: A | Discharge status: Home / Self Care | Payer: Medicare Other | Source: Ambulatory Visit | Attending: Internal Medicine | Admitting: Internal Medicine

## 2016-05-21 DIAGNOSIS — Z780 Asymptomatic menopausal state: Secondary | ICD-10-CM | POA: Insufficient documentation

## 2016-05-21 DIAGNOSIS — M810 Age-related osteoporosis without current pathological fracture: Secondary | ICD-10-CM | POA: Insufficient documentation

## 2016-05-21 DIAGNOSIS — Z9889 Other specified postprocedural states: Secondary | ICD-10-CM | POA: Diagnosis not present

## 2016-05-21 DIAGNOSIS — Z87898 Personal history of other specified conditions: Secondary | ICD-10-CM | POA: Diagnosis not present

## 2016-05-21 DIAGNOSIS — R922 Inconclusive mammogram: Secondary | ICD-10-CM | POA: Diagnosis not present

## 2016-06-06 DIAGNOSIS — J449 Chronic obstructive pulmonary disease, unspecified: Secondary | ICD-10-CM | POA: Diagnosis not present

## 2016-07-07 DIAGNOSIS — J449 Chronic obstructive pulmonary disease, unspecified: Secondary | ICD-10-CM | POA: Diagnosis not present

## 2016-08-06 DIAGNOSIS — J449 Chronic obstructive pulmonary disease, unspecified: Secondary | ICD-10-CM | POA: Diagnosis not present

## 2016-09-06 DIAGNOSIS — J449 Chronic obstructive pulmonary disease, unspecified: Secondary | ICD-10-CM | POA: Diagnosis not present

## 2016-09-18 ENCOUNTER — Other Ambulatory Visit: Payer: Self-pay | Admitting: Nurse Practitioner

## 2016-10-07 DIAGNOSIS — J449 Chronic obstructive pulmonary disease, unspecified: Secondary | ICD-10-CM | POA: Diagnosis not present

## 2016-11-04 DIAGNOSIS — J449 Chronic obstructive pulmonary disease, unspecified: Secondary | ICD-10-CM | POA: Diagnosis not present

## 2016-11-04 DIAGNOSIS — E119 Type 2 diabetes mellitus without complications: Secondary | ICD-10-CM | POA: Diagnosis not present

## 2016-11-05 ENCOUNTER — Ambulatory Visit (HOSPITAL_COMMUNITY): Payer: Medicare Other

## 2016-11-12 DIAGNOSIS — J9610 Chronic respiratory failure, unspecified whether with hypoxia or hypercapnia: Secondary | ICD-10-CM | POA: Diagnosis not present

## 2016-11-12 DIAGNOSIS — M810 Age-related osteoporosis without current pathological fracture: Secondary | ICD-10-CM | POA: Diagnosis not present

## 2016-11-12 DIAGNOSIS — E1129 Type 2 diabetes mellitus with other diabetic kidney complication: Secondary | ICD-10-CM | POA: Diagnosis not present

## 2016-11-28 ENCOUNTER — Encounter (HOSPITAL_COMMUNITY): Payer: Self-pay

## 2016-11-28 ENCOUNTER — Ambulatory Visit (HOSPITAL_COMMUNITY): Payer: Medicare Other

## 2016-11-28 ENCOUNTER — Encounter (HOSPITAL_COMMUNITY): Payer: Medicare Other | Attending: Oncology | Admitting: Oncology

## 2016-11-28 VITALS — BP 130/73 | HR 71 | Temp 97.4°F | Resp 18 | Wt 175.8 lb

## 2016-11-28 DIAGNOSIS — Z853 Personal history of malignant neoplasm of breast: Secondary | ICD-10-CM | POA: Diagnosis not present

## 2016-11-28 DIAGNOSIS — C50912 Malignant neoplasm of unspecified site of left female breast: Secondary | ICD-10-CM

## 2016-11-28 DIAGNOSIS — R0602 Shortness of breath: Secondary | ICD-10-CM

## 2016-11-28 NOTE — Progress Notes (Signed)
Paige Rhodes, [place] [address]  Progress Note   CURRENT THERAPY: Surveillance per NCCN guidelines  INTERVAL HISTORY: DEAN WONDER 83 y.o. female returns for followup of Stage II (T2, N0) Invasive ductal carcinoma of the left breast diagnosed on 03/28/2010. She lumpectomy on 04/25/2010, her cancer was 2.3 cm in size with a negative sentinel lymph node. Her cancer was ER/PR negative, but HER-2/neu over amplified. Ki-67 marker was high at 80%, 3 lymph nodes were all negative, however LV I was found. She was treated with carboplatin/Taxol x 4 cycles and Herceptin, for a total of 52 weeks. Her last dose of trastuzumab with May 15, 2011, no evidence of disease. Finished radiation therapy to the L breast with a L breast boost by Dr. Pablo Ledger from 08/16/2010-09/18/2010.   Paige Rhodes presents to the clinic today accompanied by her daughter for continuing follow up.  She had her last mammogram done in May 21, 2016 was birads 2. She has not found any abnormal lumps or bumps since then.   Denies chest pain, abdominal pain. SOB is at her baseline.  She notes that she has been able to move around fairly well. She states that she is breathing as best as she can. She is using a Helios Plus portable oxygen for breathing assistance.    Past Medical History:  Diagnosis Date  . Breast cancer ([place])   . Chronic renal disease, stage 2, mildly decreased glomerular filtration rate between 60-89 mL/min/1.73 square meter 10/31/2014  . COPD (chronic obstructive pulmonary disease) ([place])   . Diabetes mellitus   . Glaucoma   . Gout attack june 2013   left foot  . Hyperlipidemia   . Hypertension   . Hypothyroidism   . Infiltrating ductal carcinoma of left breast 03/05/2011  . Osteoporosis   . Port catheter in place 01/15/2012  . SOB (shortness of breath)   . Thyroid disease     has Infiltrating ductal carcinoma of left breast; Port catheter in place;  Chronic renal disease, stage 2, mildly decreased glomerular filtration rate between 60-89 mL/min/1.73 square meter; Controlled type 2 diabetes mellitus without complication ([place]); Abnormal presence of protein in urine; Overlapping malignant neoplasm of female breast ([place]); Adult hypothyroidism; Hyperparathyroidism ([place]); HLD (hyperlipidemia); Gout; Essential (primary) hypertension; Chronic obstructive pulmonary disease ([place]); and OP (osteoporosis) on her problem list.     is allergic to penicillins; hctz [hydrochlorothiazide]; morphine and related; and lotrel [amlodipine besy-benazepril hcl].  Paige Rhodes does not currently have medications on file.  Past Surgical History:  Procedure Laterality Date  . ANKLE SURGERY  1998  . APPENDECTOMY  1955  . BREAST LUMPECTOMY  2011   left  . CATARACT EXTRACTION W/PHACO  09/03/2012   Procedure: CATARACT EXTRACTION PHACO AND INTRAOCULAR LENS PLACEMENT (IOC);  Surgeon: Tonny Branch, MD;  Location: [facility];  Service: Ophthalmology;  Laterality: Right;  CDE: 15.26  . CATARACT EXTRACTION W/PHACO  09/28/2012   Procedure: CATARACT EXTRACTION PHACO AND INTRAOCULAR LENS PLACEMENT (IOC);  Surgeon: Tonny Branch, MD;  Location: [facility];  Service: Ophthalmology;  Laterality: Left;  CDE:  21.60  . CHOLECYSTECTOMY  1990  . PORT-A-CATH REMOVAL N/A 04/26/2015   Procedure: REMOVAL PORT-A-CATH;  Surgeon: Aviva Signs, MD;  Location: [facility];  Service: General;  Laterality: N/A;  . PORTACATH PLACEMENT  05/23/10  . THYROID SURGERY  1994  . TONSILLECTOMY    . TOTAL HIP ARTHROPLASTY  1996     Review of Systems  Constitutional: Negative.   HENT: Negative.   Eyes: Negative.   Respiratory: Negative.   Cardiovascular: Negative.  Negative for chest pain.  Gastrointestinal: Negative.  Negative for abdominal pain.  Genitourinary: Negative.   Musculoskeletal: Negative.   Skin: Negative.   Neurological: Negative.   Endo/Heme/Allergies: Negative.   Psychiatric/Behavioral: Negative.     All other systems reviewed and are negative.    PHYSICAL EXAMINATION  ECOG PERFORMANCE STATUS: 1 - Symptomatic but completely ambulatory  Vitals:   11/28/16 1519  BP: 130/73  Pulse: 71  Resp: 18  Temp: 97.4 F (36.3 C)   Filed Weights   11/28/16 1519  Weight: 175 lb 12.8 oz (79.7 kg)     Physical Exam  Constitutional: She is oriented to person, place, and time and well-developed, well-nourished, and in no distress.  HENT:  Head: Normocephalic and atraumatic.  Eyes: Conjunctivae and EOM are normal. Pupils are equal, round, and reactive to light.  Neck: Normal range of motion. Neck supple.  Cardiovascular: Normal rate, regular rhythm and normal heart sounds.   Pulmonary/Chest: Effort normal and breath sounds normal.    Abdominal: Soft. Bowel sounds are normal.  Musculoskeletal: Normal range of motion.  Neurological: She is alert and oriented to person, place, and time. Gait normal.  Skin: Skin is warm and dry.  Nursing note and vitals reviewed.    LABORATORY DATA: CBC    Component Value Date/Time   WBC 7.1 07/27/2014 1152   RBC 3.74 (L) 07/27/2014 1152   HGB 10.6 (L) 07/27/2014 1152   HCT 33.9 (L) 07/27/2014 1152   PLT 218 07/27/2014 1152   MCV 90.6 07/27/2014 1152   MCH 28.3 07/27/2014 1152   MCHC 31.3 07/27/2014 1152   RDW 12.8 07/27/2014 1152   LYMPHSABS 1.7 07/27/2014 1152   MONOABS 0.7 07/27/2014 1152   EOSABS 0.3 07/27/2014 1152   BASOSABS 0.0 07/27/2014 1152      Chemistry      Component Value Date/Time   NA 139 07/27/2014 1152   K 4.5 07/27/2014 1152   CL 98 07/27/2014 1152   CO2 32 07/27/2014 1152   BUN 27 (H) 07/27/2014 1152   CREATININE 0.81 07/27/2014 1152      Component Value Date/Time   CALCIUM 9.6 07/27/2014 1152   ALKPHOS 68 07/27/2014 1152   AST 19 07/27/2014 1152   ALT 11 07/27/2014 1152   BILITOT 0.3 07/27/2014 1152       RADIOGRAPHIC STUDIES: I have personally reviewed the radiological images as listed and agreed with  the findings in the report.  CLINICAL DATA: Annual examination. History of left breast cancer, status post lumpectomy in 2011. Images are slightly degraded by patient's respiratory motion. She is on oxygen.  EXAM: DIGITAL DIAGNOSTIC BILATERAL MAMMOGRAM WITH CAD  COMPARISON: Previous exam(s).  ACR Breast Density Category c: The breast tissue is heterogeneously dense, which may obscure small masses.  FINDINGS: There are stable lumpectomy changes in the deep outer left breast. Benign calcifications consistent with fat necrosis are seen at the lumpectomy site. No suspicious microcalcifications, mass, or nonsurgical distortion is identified in either breast to suggest malignancy. The patient has left breast skin thickening, which is unchanged to priors, and likely due to prior radiation therapy.  Mammographic images were processed with CAD.  IMPRESSION: No evidence of malignancy in either breast. Lumpectomy changes on the left.  RECOMMENDATION: Diagnostic mammogram is suggested in 1 year. (Code:[id number]-B-01Y)  I have discussed the findings and recommendations with the patient. Results were also provided  in writing at the conclusion of the visit. If applicable, a reminder letter will be sent to the patient regarding the next appointment.  BI-RADS CATEGORY 2: Benign.   Electronically Signed  By: Curlene Dolphin M.D.  On: 05/02/2015 11:46  2D DIGITAL DIAGNOSTIC BILATERAL MAMMOGRAM WITH CAD AND ADJUNCT TOMO 05/21/2016  IMPRESSION: No mammographic evidence for malignancy.   DUAL X-RAY ABSORPTIOMETRY (DXA) FOR BONE MINERAL DENSITY 05/21/2016  IMPRESSION: Ordering Physician:  Dr. Asencion Rhodes,  Your patient Paige Rhodes completed a BMD test on 05/21/2016 using the Prospect (software version: 14.10) manufactured by [employer]. The following summarizes the results of our evaluation.  PATIENT BIOGRAPHICAL: Name: Paige Rhodes, Paige Rhodes Patient  ID: [id number] Birth Date: August 23, 1932 Height: 60.0 in. Gender: Female Exam Date: 05/21/2016 Weight: 173.0 lbs. Indications: Caucasian, Follow up Osteoporosis, Height Loss, History of Fracture (Adult), Hx Breast Ca, Post Menopausal Fractures: Wrist, Ankle Treatments: Vitamin D, Calcium, Fosamax  DENSITOMETRY RESULTS: Site         Region     Measured Date Measured Age WHO Classification Young Adult T-score BMD         %Change vs. Previous Significant Change (*) Left Femur Total 7/21/[age] 82.6 Osteoporosis -2.9 0.646 g/cm2 -2.7% - Left Femur Total 04/26/[age] 79.5 Osteoporosis -2.7 0.664 g/cm2 - -  Left Forearm Radius 33% 05/21/2016 82.6 Osteoporosis -3.9 0.437 g/cm2 -2.2% - Left Forearm Radius 33% 6/26/2013 79.5 Osteoporosis -3.7 0.447 g/cm2 - - ASSESSMENT: BMD as determined from Forearm Radius 33% is 0.437 g/cm2 with a T-Score of -3.9. This patient is considered osteoporotic according to [facility]) criteria. Compared with the prior study on 04/26/2013, the BMD of the left total hip and left forearm shows no statistically significant change. (Lumbar spine was not utilized due to advanced degenerative changes.) (Patient is not a candidate for FRAX assessment due to osteoporosis.)  World Health Organization (WHO) criteria for post-menopausal, Caucasian Women: Normal:       T-score at or above -1 SD Osteopenia:   T-score between -1 and -2.5 SD Osteoporosis: T-score at or below -2.5 SD   ASSESSMENT AND PLAN:  Stage II (T2, N0) Invasive ductal carcinoma of the left breast diagnosed on 03/28/2010. She lumpectomy on 04/25/2010, her cancer was 2.3 cm in size with a negative sentinel lymph node. Her cancer was ER/PR negative, but HER-2/neu over amplified. Ki-67 marker was high at 80%, 3 lymph nodes were all negative, however LV I was found. She was treated with carboplatin/Taxol x 4 cycles and Herceptin, for a total of 52 weeks. Her last dose of trastuzumab with  May 15, 2011, no evidence of disease. Finished radiation therapy to the L breast with a L breast boost by Dr. Pablo Ledger from 08/16/2010-09/18/2010.    PLAN: Clinically NED on breast exam today. Repeat bilateral diagnostic mammogram in September 2018. RTC in 12 months for follow up. Sooner should she palpate any new breast masses.  THERAPY PLAN:  NCCN guidelines recommends the following surveillance for invasive breast cancer (2.2016):  A. History and Physical exam 1-4 times per year as clinically appropriate for 5 years, then annually.  B. Periodic screening for changes in family history and referral to genetics counseling as indicated  C. Educate, monitor, and refer to lymphedema management.  D. Mammography every 12 months  E. Routine imaging of reconstructed breast is not indicated.  F. In the absence of clinical signs and symptoms suggestive of recurrent disease, there is no indication for laboratory or imaging studies for  metastases screening.  G. Women on Tamoxifen: annual gynecologic assessment every 12 months if uterus is present.  H. Women on aromatase inhibitor or who experience ovarian failure secondary to treatment should have monitoring of bone health with a bone mineral density determination at baseline and periodically thereafter.  I. Assess and encourage adherence to adjuvant endocrine therapy.  J. Evidence suggests that active lifestyle, healthy diet, limited alcohol intake, and achieving and maintaining an ideal body weight (20-25 BMI) may lead to optimal breast cancer outcomes.    All questions were answered. The patient knows to call the clinic with any problems, questions or concerns. We can certainly see the patient much sooner if necessary.  This document serves as a record of services personally performed by Twana First, MD. It was created on her behalf by Shirlean Mylar, a trained medical scribe. The creation of this record is based on the scribe's personal  observations and the provider's statements to them. This document has been checked and approved by the attending provider.  I have reviewed the above documentation for accuracy and completeness and I agree with the above.  This note is electronically signed by: Mikey College 11/28/2016 3:27 PM

## 2016-11-28 NOTE — Patient Instructions (Signed)
[place] at [facility] Discharge Instructions  RECOMMENDATIONS MADE BY THE CONSULTANT AND ANY TEST RESULTS WILL BE SENT TO YOUR REFERRING PHYSICIAN.  You were seen today by Dr. Twana First Follow up in 1 year Have your mammogram done in September See Amy up front for appointments   Thank you for choosing [place] at [facility] to provide your oncology and hematology care.  To afford each patient quality time with our provider, please arrive at least 15 minutes before your scheduled appointment time.    If you have a lab appointment with the [facility] please come in thru the  Main Entrance and check in at the main information desk  You need to re-schedule your appointment should you arrive 10 or more minutes late.  We strive to give you quality time with our providers, and arriving late affects you and other patients whose appointments are after yours.  Also, if you no show three or more times for appointments you may be dismissed from the clinic at the providers discretion.     Again, thank you for choosing [facility].  Our hope is that these requests will decrease the amount of time that you wait before being seen by our physicians.       _____________________________________________________________  Should you have questions after your visit to [facility], please contact our office at [phone] between the hours of 8:30 a.m. and 4:30 p.m.  Voicemails left after 4:30 p.m. will not be returned until the following business day.  For prescription refill requests, have your pharmacy contact our office.       Resources For Cancer Patients and their Caregivers ? American Cancer Society: Can assist with transportation, wigs, general needs, runs Look Good Feel Better.        (807)390-8400 ? Cancer Care: Provides financial assistance, online support groups, medication/co-pay assistance.  1-800-813-HOPE  615-036-9548) ? [facility] Assists [place] cancer patients and their families through emotional , educational and financial support.  (984)843-0430 ? [place] DSS Where to apply for food stamps, Medicaid and utility assistance. 929-535-3946 ? [facility]: Transportation to medical appointments. 513-234-0356 ? Social Security Administration: May apply for disability if have a Stage IV cancer. 616-364-2018 862-147-7882 ? [employer], Disability and Transit Services: Assists with nutrition, care and transit needs. [place] Support Programs: @10RELATIVEDAYS @ > Cancer Support Group  2nd Tuesday of the month 1pm-2pm, Journey Room  > Creative Journey  3rd Tuesday of the month 1130am-1pm, Journey Room  > Look Good Feel Better  1st Wednesday of the month 10am-12 noon, Journey Room (Call [place] to register [phone])

## 2016-12-05 DIAGNOSIS — J449 Chronic obstructive pulmonary disease, unspecified: Secondary | ICD-10-CM | POA: Diagnosis not present

## 2016-12-16 DIAGNOSIS — H401131 Primary open-angle glaucoma, bilateral, mild stage: Secondary | ICD-10-CM | POA: Diagnosis not present

## 2017-01-04 DIAGNOSIS — J449 Chronic obstructive pulmonary disease, unspecified: Secondary | ICD-10-CM | POA: Diagnosis not present

## 2017-02-04 DIAGNOSIS — J449 Chronic obstructive pulmonary disease, unspecified: Secondary | ICD-10-CM | POA: Diagnosis not present

## 2017-03-06 DIAGNOSIS — J449 Chronic obstructive pulmonary disease, unspecified: Secondary | ICD-10-CM | POA: Diagnosis not present

## 2017-04-06 DIAGNOSIS — J449 Chronic obstructive pulmonary disease, unspecified: Secondary | ICD-10-CM | POA: Diagnosis not present

## 2017-04-21 DIAGNOSIS — H26493 Other secondary cataract, bilateral: Secondary | ICD-10-CM | POA: Diagnosis not present

## 2017-04-21 DIAGNOSIS — H40012 Open angle with borderline findings, low risk, left eye: Secondary | ICD-10-CM | POA: Diagnosis not present

## 2017-04-21 DIAGNOSIS — H401114 Primary open-angle glaucoma, right eye, indeterminate stage: Secondary | ICD-10-CM | POA: Diagnosis not present

## 2017-05-07 DIAGNOSIS — J449 Chronic obstructive pulmonary disease, unspecified: Secondary | ICD-10-CM | POA: Diagnosis not present

## 2017-05-13 ENCOUNTER — Encounter (HOSPITAL_COMMUNITY): Payer: Medicare Other

## 2017-05-13 DIAGNOSIS — E785 Hyperlipidemia, unspecified: Secondary | ICD-10-CM | POA: Diagnosis not present

## 2017-05-13 DIAGNOSIS — I10 Essential (primary) hypertension: Secondary | ICD-10-CM | POA: Diagnosis not present

## 2017-05-13 DIAGNOSIS — Z79899 Other long term (current) drug therapy: Secondary | ICD-10-CM | POA: Diagnosis not present

## 2017-05-13 DIAGNOSIS — E039 Hypothyroidism, unspecified: Secondary | ICD-10-CM | POA: Diagnosis not present

## 2017-05-13 DIAGNOSIS — E1129 Type 2 diabetes mellitus with other diabetic kidney complication: Secondary | ICD-10-CM | POA: Diagnosis not present

## 2017-05-19 DIAGNOSIS — Z0001 Encounter for general adult medical examination with abnormal findings: Secondary | ICD-10-CM | POA: Diagnosis not present

## 2017-05-19 DIAGNOSIS — E1129 Type 2 diabetes mellitus with other diabetic kidney complication: Secondary | ICD-10-CM | POA: Diagnosis not present

## 2017-05-19 DIAGNOSIS — Z23 Encounter for immunization: Secondary | ICD-10-CM | POA: Diagnosis not present

## 2017-05-19 DIAGNOSIS — J9611 Chronic respiratory failure with hypoxia: Secondary | ICD-10-CM | POA: Diagnosis not present

## 2017-05-27 ENCOUNTER — Encounter (HOSPITAL_COMMUNITY): Payer: Medicare Other

## 2017-06-06 DIAGNOSIS — J449 Chronic obstructive pulmonary disease, unspecified: Secondary | ICD-10-CM | POA: Diagnosis not present

## 2017-06-17 ENCOUNTER — Ambulatory Visit (HOSPITAL_COMMUNITY)
Admission: RE | Admit: 2017-06-17 | Discharge: 2017-06-17 | Disposition: A | Discharge status: Home / Self Care | Payer: Medicare Other | Source: Ambulatory Visit | Attending: Oncology | Admitting: Oncology

## 2017-06-17 DIAGNOSIS — R928 Other abnormal and inconclusive findings on diagnostic imaging of breast: Secondary | ICD-10-CM | POA: Diagnosis not present

## 2017-06-17 DIAGNOSIS — C50912 Malignant neoplasm of unspecified site of left female breast: Secondary | ICD-10-CM | POA: Diagnosis not present

## 2017-07-07 DIAGNOSIS — J449 Chronic obstructive pulmonary disease, unspecified: Secondary | ICD-10-CM | POA: Diagnosis not present

## 2017-08-06 DIAGNOSIS — J449 Chronic obstructive pulmonary disease, unspecified: Secondary | ICD-10-CM | POA: Diagnosis not present

## 2017-09-02 ENCOUNTER — Other Ambulatory Visit: Payer: Self-pay

## 2017-09-02 ENCOUNTER — Inpatient Hospital Stay (HOSPITAL_COMMUNITY)
Admission: EM | Admit: 2017-09-02 | Discharge: 2017-09-04 | DRG: 194 | Disposition: A | Discharge status: Home / Self Care | Payer: Medicare Other | Attending: Family Medicine | Admitting: Family Medicine

## 2017-09-02 ENCOUNTER — Encounter (HOSPITAL_COMMUNITY): Payer: Self-pay | Admitting: *Deleted

## 2017-09-02 ENCOUNTER — Emergency Department (HOSPITAL_COMMUNITY): Payer: Medicare Other

## 2017-09-02 DIAGNOSIS — E785 Hyperlipidemia, unspecified: Secondary | ICD-10-CM | POA: Diagnosis not present

## 2017-09-02 DIAGNOSIS — Z7982 Long term (current) use of aspirin: Secondary | ICD-10-CM | POA: Diagnosis not present

## 2017-09-02 DIAGNOSIS — E1122 Type 2 diabetes mellitus with diabetic chronic kidney disease: Secondary | ICD-10-CM | POA: Diagnosis not present

## 2017-09-02 DIAGNOSIS — Z880 Allergy status to penicillin: Secondary | ICD-10-CM

## 2017-09-02 DIAGNOSIS — D631 Anemia in chronic kidney disease: Secondary | ICD-10-CM | POA: Diagnosis present

## 2017-09-02 DIAGNOSIS — E039 Hypothyroidism, unspecified: Secondary | ICD-10-CM | POA: Diagnosis present

## 2017-09-02 DIAGNOSIS — R7989 Other specified abnormal findings of blood chemistry: Secondary | ICD-10-CM | POA: Diagnosis not present

## 2017-09-02 DIAGNOSIS — I10 Essential (primary) hypertension: Secondary | ICD-10-CM | POA: Diagnosis present

## 2017-09-02 DIAGNOSIS — Z87891 Personal history of nicotine dependence: Secondary | ICD-10-CM

## 2017-09-02 DIAGNOSIS — J440 Chronic obstructive pulmonary disease with acute lower respiratory infection: Secondary | ICD-10-CM | POA: Diagnosis present

## 2017-09-02 DIAGNOSIS — R748 Abnormal levels of other serum enzymes: Secondary | ICD-10-CM | POA: Diagnosis present

## 2017-09-02 DIAGNOSIS — J441 Chronic obstructive pulmonary disease with (acute) exacerbation: Secondary | ICD-10-CM | POA: Diagnosis not present

## 2017-09-02 DIAGNOSIS — Z885 Allergy status to narcotic agent status: Secondary | ICD-10-CM

## 2017-09-02 DIAGNOSIS — R350 Frequency of micturition: Secondary | ICD-10-CM | POA: Diagnosis present

## 2017-09-02 DIAGNOSIS — Z96649 Presence of unspecified artificial hip joint: Secondary | ICD-10-CM | POA: Diagnosis not present

## 2017-09-02 DIAGNOSIS — R509 Fever, unspecified: Secondary | ICD-10-CM | POA: Diagnosis not present

## 2017-09-02 DIAGNOSIS — R0602 Shortness of breath: Secondary | ICD-10-CM | POA: Diagnosis not present

## 2017-09-02 DIAGNOSIS — J181 Lobar pneumonia, unspecified organism: Secondary | ICD-10-CM | POA: Diagnosis not present

## 2017-09-02 DIAGNOSIS — I129 Hypertensive chronic kidney disease with stage 1 through stage 4 chronic kidney disease, or unspecified chronic kidney disease: Secondary | ICD-10-CM | POA: Diagnosis not present

## 2017-09-02 DIAGNOSIS — Z853 Personal history of malignant neoplasm of breast: Secondary | ICD-10-CM

## 2017-09-02 DIAGNOSIS — N182 Chronic kidney disease, stage 2 (mild): Secondary | ICD-10-CM | POA: Diagnosis present

## 2017-09-02 DIAGNOSIS — Z888 Allergy status to other drugs, medicaments and biological substances status: Secondary | ICD-10-CM | POA: Diagnosis not present

## 2017-09-02 DIAGNOSIS — Z7983 Long term (current) use of bisphosphonates: Secondary | ICD-10-CM

## 2017-09-02 DIAGNOSIS — E782 Mixed hyperlipidemia: Secondary | ICD-10-CM | POA: Diagnosis present

## 2017-09-02 DIAGNOSIS — E890 Postprocedural hypothyroidism: Secondary | ICD-10-CM | POA: Diagnosis present

## 2017-09-02 DIAGNOSIS — E119 Type 2 diabetes mellitus without complications: Secondary | ICD-10-CM

## 2017-09-02 DIAGNOSIS — J189 Pneumonia, unspecified organism: Secondary | ICD-10-CM | POA: Diagnosis not present

## 2017-09-02 DIAGNOSIS — D649 Anemia, unspecified: Secondary | ICD-10-CM | POA: Diagnosis not present

## 2017-09-02 DIAGNOSIS — J449 Chronic obstructive pulmonary disease, unspecified: Secondary | ICD-10-CM | POA: Diagnosis not present

## 2017-09-02 DIAGNOSIS — Z79890 Hormone replacement therapy (postmenopausal): Secondary | ICD-10-CM | POA: Diagnosis not present

## 2017-09-02 DIAGNOSIS — M810 Age-related osteoporosis without current pathological fracture: Secondary | ICD-10-CM | POA: Diagnosis not present

## 2017-09-02 DIAGNOSIS — R778 Other specified abnormalities of plasma proteins: Secondary | ICD-10-CM | POA: Diagnosis present

## 2017-09-02 LAB — CBC WITH DIFFERENTIAL/PLATELET
BASOS ABS: 0 10*3/uL (ref 0.0–0.1)
Basophils Relative: 0 %
Eosinophils Absolute: 0 10*3/uL (ref 0.0–0.7)
Eosinophils Relative: 0 %
HCT: 29 % — ABNORMAL LOW (ref 36.0–46.0)
Hemoglobin: 9.3 g/dL — ABNORMAL LOW (ref 12.0–15.0)
LYMPHS ABS: 1 10*3/uL (ref 0.7–4.0)
Lymphocytes Relative: 6 %
MCH: 28.7 pg (ref 26.0–34.0)
MCHC: 32.1 g/dL (ref 30.0–36.0)
MCV: 89.5 fL (ref 78.0–100.0)
MONO ABS: 0.7 10*3/uL (ref 0.1–1.0)
Monocytes Relative: 4 %
Neutro Abs: 14.6 10*3/uL — ABNORMAL HIGH (ref 1.7–7.7)
Neutrophils Relative %: 90 %
PLATELETS: 188 10*3/uL (ref 150–400)
RBC: 3.24 MIL/uL — AB (ref 3.87–5.11)
RDW: 14 % (ref 11.5–15.5)
WBC Morphology: INCREASED
WBC: 16.3 10*3/uL — AB (ref 4.0–10.5)

## 2017-09-02 LAB — GLUCOSE, CAPILLARY: Glucose-Capillary: 121 mg/dL — ABNORMAL HIGH (ref 65–99)

## 2017-09-02 LAB — COMPREHENSIVE METABOLIC PANEL
ALBUMIN: 3.2 g/dL — AB (ref 3.5–5.0)
ALT: 21 U/L (ref 14–54)
ANION GAP: 14 (ref 5–15)
AST: 43 U/L — AB (ref 15–41)
Alkaline Phosphatase: 54 U/L (ref 38–126)
BUN: 33 mg/dL — AB (ref 6–20)
CHLORIDE: 96 mmol/L — AB (ref 101–111)
CO2: 27 mmol/L (ref 22–32)
Calcium: 9.7 mg/dL (ref 8.9–10.3)
Creatinine, Ser: 1.23 mg/dL — ABNORMAL HIGH (ref 0.44–1.00)
GFR calc Af Amer: 46 mL/min — ABNORMAL LOW (ref >60)
GFR, EST NON AFRICAN AMERICAN: 39 mL/min — AB (ref >60)
Glucose, Bld: 120 mg/dL — ABNORMAL HIGH (ref 65–99)
POTASSIUM: 3.6 mmol/L (ref 3.5–5.1)
Sodium: 137 mmol/L (ref 135–145)
Total Bilirubin: 0.7 mg/dL (ref 0.3–1.2)
Total Protein: 7.1 g/dL (ref 6.5–8.1)

## 2017-09-02 LAB — TROPONIN I
TROPONIN I: 0.11 ng/mL — AB (ref <0.03)
Troponin I: 0.08 ng/mL (ref <0.03)

## 2017-09-02 LAB — PHOSPHORUS: PHOSPHORUS: 3.2 mg/dL (ref 2.5–4.6)

## 2017-09-02 LAB — BRAIN NATRIURETIC PEPTIDE: B Natriuretic Peptide: 398 pg/mL — ABNORMAL HIGH (ref 0.0–100.0)

## 2017-09-02 LAB — MAGNESIUM: Magnesium: 1.2 mg/dL — ABNORMAL LOW (ref 1.7–2.4)

## 2017-09-02 MED ORDER — IPRATROPIUM BROMIDE 0.02 % IN SOLN: 0.5000 mg | Freq: Four times a day (QID) | RESPIRATORY_TRACT | Status: DC

## 2017-09-02 MED ORDER — LEVOTHYROXINE SODIUM 25 MCG PO TABS
125.0000 ug | ORAL_TABLET | Freq: Every day | ORAL | Status: DC
  Administered 2017-09-03 – 2017-09-04 (×2): 125 ug via ORAL
  Filled 2017-09-02 (×2): qty 1

## 2017-09-02 MED ORDER — LEVOFLOXACIN IN D5W 500 MG/100ML IV SOLN
500.0000 mg | Freq: Once | INTRAVENOUS | Status: AC
  Administered 2017-09-02: 500 mg via INTRAVENOUS
  Filled 2017-09-02: qty 100

## 2017-09-02 MED ORDER — ENOXAPARIN SODIUM 40 MG/0.4ML ~~LOC~~ SOLN
40.0000 mg | SUBCUTANEOUS | Status: DC
  Administered 2017-09-02 – 2017-09-03 (×2): 40 mg via SUBCUTANEOUS
  Filled 2017-09-02 (×2): qty 0.4

## 2017-09-02 MED ORDER — ASPIRIN EC 81 MG PO TBEC
81.0000 mg | DELAYED_RELEASE_TABLET | Freq: Every day | ORAL | Status: DC
  Administered 2017-09-03 – 2017-09-04 (×2): 81 mg via ORAL
  Filled 2017-09-02 (×2): qty 1

## 2017-09-02 MED ORDER — ONDANSETRON HCL 4 MG PO TABS: 4.0000 mg | ORAL_TABLET | Freq: Four times a day (QID) | ORAL | Status: DC | PRN

## 2017-09-02 MED ORDER — INSULIN ASPART 100 UNIT/ML ~~LOC~~ SOLN
0.0000 [IU] | Freq: Three times a day (TID) | SUBCUTANEOUS | Status: DC
  Administered 2017-09-03: 2 [IU] via SUBCUTANEOUS
  Administered 2017-09-04: 1 [IU] via SUBCUTANEOUS
  Administered 2017-09-04: 2 [IU] via SUBCUTANEOUS

## 2017-09-02 MED ORDER — AMLODIPINE BESYLATE 5 MG PO TABS
5.0000 mg | ORAL_TABLET | Freq: Every day | ORAL | Status: DC
  Administered 2017-09-03 – 2017-09-04 (×2): 5 mg via ORAL
  Filled 2017-09-02 (×2): qty 1

## 2017-09-02 MED ORDER — ORAL CARE MOUTH RINSE
15.0000 mL | Freq: Two times a day (BID) | OROMUCOSAL | Status: DC
  Administered 2017-09-02 – 2017-09-04 (×4): 15 mL via OROMUCOSAL

## 2017-09-02 MED ORDER — LOSARTAN POTASSIUM 50 MG PO TABS
50.0000 mg | ORAL_TABLET | Freq: Every day | ORAL | Status: DC
  Administered 2017-09-03 – 2017-09-04 (×2): 50 mg via ORAL
  Filled 2017-09-02 (×2): qty 1

## 2017-09-02 MED ORDER — LEVOFLOXACIN IN D5W 750 MG/150ML IV SOLN: 750.0000 mg | INTRAVENOUS | Status: DC

## 2017-09-02 MED ORDER — ALBUTEROL SULFATE (2.5 MG/3ML) 0.083% IN NEBU: 2.5000 mg | INHALATION_SOLUTION | RESPIRATORY_TRACT | Status: DC | PRN

## 2017-09-02 MED ORDER — DIPHENHYDRAMINE HCL 25 MG PO CAPS
50.0000 mg | ORAL_CAPSULE | Freq: Every day | ORAL | Status: DC
  Administered 2017-09-02 – 2017-09-03 (×2): 50 mg via ORAL
  Filled 2017-09-02 (×2): qty 2

## 2017-09-02 MED ORDER — AZITHROMYCIN 500 MG IV SOLR
500.0000 mg | INTRAVENOUS | Status: DC
  Filled 2017-09-02: qty 500

## 2017-09-02 MED ORDER — IPRATROPIUM-ALBUTEROL 0.5-2.5 (3) MG/3ML IN SOLN
3.0000 mL | Freq: Four times a day (QID) | RESPIRATORY_TRACT | Status: DC
  Administered 2017-09-02 – 2017-09-04 (×6): 3 mL via RESPIRATORY_TRACT
  Filled 2017-09-02 (×7): qty 3

## 2017-09-02 MED ORDER — MAGNESIUM SULFATE 4 GM/100ML IV SOLN
4.0000 g | Freq: Once | INTRAVENOUS | Status: AC
  Administered 2017-09-02: 4 g via INTRAVENOUS
  Filled 2017-09-02: qty 100

## 2017-09-02 MED ORDER — POTASSIUM CHLORIDE CRYS ER 20 MEQ PO TBCR
20.0000 meq | EXTENDED_RELEASE_TABLET | Freq: Once | ORAL | Status: AC
  Administered 2017-09-02: 20 meq via ORAL
  Filled 2017-09-02: qty 1

## 2017-09-02 MED ORDER — ALBUTEROL SULFATE (2.5 MG/3ML) 0.083% IN NEBU: 2.5000 mg | INHALATION_SOLUTION | Freq: Four times a day (QID) | RESPIRATORY_TRACT | Status: DC

## 2017-09-02 MED ORDER — IPRATROPIUM-ALBUTEROL 0.5-2.5 (3) MG/3ML IN SOLN
3.0000 mL | Freq: Once | RESPIRATORY_TRACT | Status: AC
  Administered 2017-09-02: 3 mL via RESPIRATORY_TRACT
  Filled 2017-09-02: qty 3

## 2017-09-02 MED ORDER — FUROSEMIDE 10 MG/ML IJ SOLN
20.0000 mg | Freq: Once | INTRAMUSCULAR | Status: AC
  Administered 2017-09-02: 20 mg via INTRAVENOUS
  Filled 2017-09-02: qty 2

## 2017-09-02 MED ORDER — METHYLPREDNISOLONE SODIUM SUCC 125 MG IJ SOLR
60.0000 mg | Freq: Once | INTRAMUSCULAR | Status: AC
  Administered 2017-09-02: 60 mg via INTRAVENOUS
  Filled 2017-09-02: qty 2

## 2017-09-02 MED ORDER — ONDANSETRON HCL 4 MG/2ML IJ SOLN: 4.0000 mg | Freq: Four times a day (QID) | INTRAMUSCULAR | Status: DC | PRN

## 2017-09-02 MED ORDER — DIPHENHYDRAMINE HCL (SLEEP) 50 MG PO CAPS: 1.0000 | ORAL_CAPSULE | Freq: Every day | ORAL | Status: DC

## 2017-09-02 MED ORDER — CARVEDILOL 12.5 MG PO TABS
25.0000 mg | ORAL_TABLET | Freq: Two times a day (BID) | ORAL | Status: DC
  Administered 2017-09-02 – 2017-09-04 (×4): 25 mg via ORAL
  Filled 2017-09-02 (×4): qty 2

## 2017-09-02 NOTE — ED Triage Notes (Signed)
Fever, body aches, thinks she has the flu

## 2017-09-02 NOTE — ED Provider Notes (Signed)
[facility] EMERGENCY DEPARTMENT Provider Note   CSN: [id number] Arrival date & time: 09/02/17  1602     History   Chief Complaint Chief Complaint  Patient presents with  . Fever    HPI Paige Rhodes is a 84 y.o. female.  Level 5 caveat for urgent need for intervention.  Patient presents with fever, malaise, achiness, dyspnea, feeling cold for approximately 2 days.  She is more dyspneic than usual.  No anterior chest pain.  Severity of symptoms is moderate.  Past medical history includes COPD, diabetes, hypertension, hyperlipidemia, chronic kidney disease stage II, cancer.      Past Medical History:  Diagnosis Date  . Breast cancer ([place])   . Chronic renal disease, stage 2, mildly decreased glomerular filtration rate between 60-89 mL/min/1.73 square meter 10/31/2014  . COPD (chronic obstructive pulmonary disease) ([place])   . Diabetes mellitus   . Glaucoma   . Gout attack june 2013   left foot  . Hyperlipidemia   . Hypertension   . Hypothyroidism   . Infiltrating ductal carcinoma of left breast 03/05/2011  . Osteoporosis   . Port catheter in place 01/15/2012  . SOB (shortness of breath)   . Thyroid disease     Patient Active Problem List   Diagnosis Date Noted  . CAP (community acquired pneumonia) 09/02/2017  . Chronic renal disease, stage 2, mildly decreased glomerular filtration rate between 60-89 mL/min/1.73 square meter 10/31/2014  . Port catheter in place 01/15/2012  . Controlled type 2 diabetes mellitus without complication ([place]) 16/38/4665  . Abnormal presence of protein in urine 01/15/2012  . Adult hypothyroidism 01/15/2012  . Hyperparathyroidism ([place]) 01/15/2012  . HLD (hyperlipidemia) 01/15/2012  . Gout 01/15/2012  . Essential (primary) hypertension 01/15/2012  . Chronic obstructive pulmonary disease ([place]) 01/15/2012  . OP (osteoporosis) 01/15/2012  . Infiltrating ductal carcinoma of left breast 03/05/2011  . Overlapping malignant neoplasm of female breast  ([place]) 03/08/2010    Past Surgical History:  Procedure Laterality Date  . ANKLE SURGERY  1998  . APPENDECTOMY  1955  . BREAST LUMPECTOMY  2011   left  . CATARACT EXTRACTION W/PHACO  09/03/2012   Procedure: CATARACT EXTRACTION PHACO AND INTRAOCULAR LENS PLACEMENT (IOC);  Surgeon: Tonny Branch, MD;  Location: [facility];  Service: Ophthalmology;  Laterality: Right;  CDE: 15.26  . CATARACT EXTRACTION W/PHACO  09/28/2012   Procedure: CATARACT EXTRACTION PHACO AND INTRAOCULAR LENS PLACEMENT (IOC);  Surgeon: Tonny Branch, MD;  Location: [facility];  Service: Ophthalmology;  Laterality: Left;  CDE:  21.60  . CHOLECYSTECTOMY  1990  . PORT-A-CATH REMOVAL N/A 04/26/2015   Procedure: REMOVAL PORT-A-CATH;  Surgeon: Aviva Signs, MD;  Location: [facility] ORS;  Service: General;  Laterality: N/A;  . PORTACATH PLACEMENT  05/23/10  . THYROID SURGERY  1994  . TONSILLECTOMY    . TOTAL HIP ARTHROPLASTY  1996    OB History    No data available       Home Medications    Prior to Admission medications   Medication Sig Start Date End Date Taking? Authorizing Provider  amLODipine (NORVASC) 5 MG tablet Take 5 mg by mouth daily.    Yes [provider]  aspirin EC 81 MG tablet Take 81 mg by mouth daily.   Yes [provider]  Calcium-Magnesium-Vitamin D (CALCIUM 500 PO) Take 1 tablet by mouth daily.   Yes [provider]  carvedilol (COREG) 25 MG tablet Take 25 mg by mouth 2 (two) times daily with a meal.  Yes [provider]  Cholecalciferol (VITAMIN D-3) 1000 units CAPS Take 1 capsule by mouth daily.   Yes [provider]  DiphenhydrAMINE HCl, Sleep, (NIGHTTIME SLEEP AID) 50 MG CAPS Take 1 capsule by mouth at bedtime.    Yes [provider]  ibuprofen (ADVIL,MOTRIN) 200 MG tablet Take 400 mg by mouth every 6 (six) hours as needed. For pain   Yes [provider]  levothyroxine (SYNTHROID, LEVOTHROID) 125 MCG tablet Take 125 mcg by mouth daily.     Yes [provider]  losartan (COZAAR) 50 MG tablet Take 50 mg by mouth daily.     Yes [provider]  SPIRIVA HANDIHALER 18 MCG inhalation capsule Place 18 mcg into inhaler and inhale daily. 10/02/15  Yes [provider]  alendronate (FOSAMAX) 70 MG tablet Take 70 mg by mouth every 7 (seven) days. Take with a full glass of water on an empty stomach. **Takes on Wednesday**    [provider]    Family History Family History  Problem Relation Age of Onset  . Kidney disease Mother   . Heart disease Father   . Hypertension Brother   . Cancer Sister        breast and lung    Social History Social History   Tobacco Use  . Smoking status: Former Smoker    Packs/day: 3.00    Years: 30.00    Pack years: 90.00    Types: Cigarettes    Last attempt to quit: 09/02/1991    Years since quitting: 26.0  . Smokeless tobacco: Never Used  Substance Use Topics  . Alcohol use: No  . Drug use: No     Allergies   Penicillins; Hctz [hydrochlorothiazide]; Morphine and related; and Lotrel [amlodipine besy-benazepril hcl]   Review of Systems Review of Systems  Unable to perform ROS: Acuity of condition     Physical Exam Updated Vital Signs BP (!) 109/46   Pulse 67   Temp 98.8 F (37.1 C) (Oral)   Resp (!) 26   Wt 79.4 kg (175 lb)   SpO2 92%   BMI 32.01 kg/m   Physical Exam  Constitutional: She is oriented to person, place, and time.  Pleasant, slightly dyspneic and tachypneic  HENT:  Head: Normocephalic and atraumatic.  Eyes: Conjunctivae are normal.  Neck: Neck supple.  Cardiovascular: Normal rate and regular rhythm.  Pulmonary/Chest: Effort normal.  Bilateral expiratory wheezing left greater than right  Abdominal: Soft. Bowel sounds are normal.  Musculoskeletal: Normal range of motion.  Neurological: She is alert and oriented to person, place, and time.  Skin: Skin is warm and dry.  Psychiatric: She has a normal mood and affect. Her behavior is  normal.  Nursing note and vitals reviewed.    ED Treatments / Results  Labs (all labs ordered are listed, but only abnormal results are displayed) Labs Reviewed  CBC WITH DIFFERENTIAL/PLATELET - Abnormal; Notable for the following components:      Result Value   WBC 16.3 (*)    RBC 3.24 (*)    Hemoglobin 9.3 (*)    HCT 29.0 (*)    Neutro Abs 14.6 (*)    All other components within normal limits  COMPREHENSIVE METABOLIC PANEL - Abnormal; Notable for the following components:   Chloride 96 (*)    Glucose, Bld 120 (*)    BUN 33 (*)    Creatinine, Ser 1.23 (*)    Albumin 3.2 (*)    AST 43 (*)  GFR calc non Af Amer 39 (*)    GFR calc Af Amer 46 (*)    All other components within normal limits  TROPONIN I - Abnormal; Notable for the following components:   Troponin I 0.11 (*)    All other components within normal limits  BRAIN NATRIURETIC PEPTIDE - Abnormal; Notable for the following components:   B Natriuretic Peptide 398.0 (*)    All other components within normal limits    EKG  EKG Interpretation None      Date: 09/02/2017  Rate: 65  Rhythm: normal sinus rhythm  QRS Axis: normal  Intervals: normal  ST/T Wave abnormalities: normal  Conduction Disutrbances: none  Narrative Interpretation: unremarkable     Radiology Dg Chest 2 View  Result Date: 09/02/2017 CLINICAL DATA:  Fever and shortness of breath for several days EXAM: CHEST  2 VIEW COMPARISON:  June 26, 2011 FINDINGS: The heart size and mediastinal contours are within normal limits. There is patchy consolidation of left lung base. There is probable calcified granuloma of the right lung base. There is mild increased pulmonary interstitium. Small left pleural effusion is identified. The visualized skeletal structures are unremarkable. IMPRESSION: Left lung base pneumonia with pleural effusion. Probable mild interstitial edema. Electronically Signed   By: Abelardo Diesel M.D.   On: 09/02/2017 18:17     Procedures Procedures (including critical care time)  Medications Ordered in ED Medications  levofloxacin (LEVAQUIN) IVPB 500 mg (not administered)  ipratropium-albuterol (DUONEB) 0.5-2.5 (3) MG/3ML nebulizer solution 3 mL (3 mLs Nebulization Given 09/02/17 1719)  methylPREDNISolone sodium succinate (SOLU-MEDROL) 125 mg/2 mL injection 60 mg (60 mg Intravenous Given 09/02/17 1826)  furosemide (LASIX) injection 20 mg (20 mg Intravenous Given 09/02/17 1848)     Initial Impression / Assessment and Plan / ED Course  I have reviewed the triage vital signs and the nursing notes.  Pertinent labs & imaging results that were available during my care of the patient were reviewed by me and considered in my medical decision making (see chart for details).     Patient presents with viral-like syndrome.  She is tachypneic and dyspneic.  Chest x-ray shows left lung base pneumonia with effusion with possible mild interstitial edema.  EKG is normal.  Troponin is slightly elevated.  She is still anemic.  Will Rx a nebulizer treatment, Lasix 20 mg IV, Levaquin 500 mg IV (patient has anaphylaxis to penicillin).  Discussed with patient and HER-2 daughters.  Admit to general medicine.   CRITICAL CARE Performed by: Nat Christen Total critical care time: 30 minutes Critical care time was exclusive of separately billable procedures and treating other patients. Critical care was necessary to treat or prevent imminent or life-threatening deterioration. Critical care was time spent personally by me on the following activities: development of treatment plan with patient and/or surrogate as well as nursing, discussions with consultants, evaluation of patient's response to treatment, examination of patient, obtaining history from patient or surrogate, ordering and performing treatments and interventions, ordering and review of laboratory studies, ordering and review of radiographic studies, pulse oximetry and re-evaluation  of patient's condition.  Final Clinical Impressions(s) / ED Diagnoses   Final diagnoses:  Community acquired pneumonia of left lower lobe of lung ([place])  Elevated troponin    ED Discharge Orders    None       Nat Christen, MD 09/02/17 1910

## 2017-09-02 NOTE — H&P (Signed)
History and Physical    Paige Rhodes BJS:[id number] DOB: 1932-02-22 DOA: 09/02/2017  PCP: [name], MD   Patient coming from: Home.  I have personally briefly reviewed patient's old medical records in [place]  Chief Complaint: Fever and body aches.  HPI: Paige Rhodes is a 84 y.o. female with medical history significant of breast cancer, chronic renal disease, COPD, type 2 diabetes, glaucoma, gout, hyperlipidemia, hypertension, hypothyroidism, osteoporosis who is coming to the emergency department with complaints of fever, progressively worse body aches, fatigue, malaise, decreased appetite and mild dyspnea since Monday.  She denies earaches, sore throat, productive cough, wheezing, hemoptysis, chest pain, palpitations, diaphoresis, PND, orthopnea or pitting edema lower extremities.  She mentions that she has had some positional dizziness when getting out of bed and standing up from sitting position, which her daughter attributes to decrease fluid and overall oral intake over the past few days.  No abdominal pain, nausea, emesis, diarrhea, melena, hematochezia or constipation.  Denies dysuria, frequency or hematuria.  No blurred vision, polyuria, polydipsia or polyphagia.  She denies skin rashes or pruritus.  There is no travel history or known sick contacts.  ED Course: Initial vital signs temperature 37.1C (98.8 F), pulse 78, respirations 28, blood pressure 126/46 mmHg and O2 sat 82% on room air.  Her white count was 16.3 with 90% neutrophils, hemoglobin 9.3 g/dL and platelets 188.  BNP was 398 pg/mL.  EKG was normal sinus rhythm.  First troponin 0.11 ng/mL.  Sodium 137, potassium 3.6, chloride 96 and CO2 27.  Her anion gap is 14.  Her glucose is 120, BUN 33, creatinine 1.23, calcium 9.7, magnesium 1.2 and phosphorus 3.2 mg/dL.  Her albumin was mildly decreased at 3.2 g/dL and AST is slightly increased at 43 U/L.  All other LFTs values are normal.    Her chest radiograph  shows left lower lobe infiltrate with pleural effusion.  Mild interstitial edema.  Please see images and full radiology report for further details.  Review of Systems: As per HPI otherwise 10 point review of systems negative.    Past Medical History:  Diagnosis Date  . Breast cancer ([place])   . Chronic renal disease, stage 2, mildly decreased glomerular filtration rate between 60-89 mL/min/1.73 square meter 10/31/2014  . COPD (chronic obstructive pulmonary disease) ([place])   . Diabetes mellitus   . Glaucoma   . Gout attack june 2013   left foot  . Hyperlipidemia   . Hypertension   . Hypothyroidism   . Infiltrating ductal carcinoma of left breast 03/05/2011  . Osteoporosis   . Port catheter in place 01/15/2012  . SOB (shortness of breath)   . Thyroid disease     Past Surgical History:  Procedure Laterality Date  . ANKLE SURGERY  1998  . APPENDECTOMY  1955  . BREAST LUMPECTOMY  2011   left  . CATARACT EXTRACTION W/PHACO  09/03/2012   Procedure: CATARACT EXTRACTION PHACO AND INTRAOCULAR LENS PLACEMENT (IOC);  Surgeon: Tonny Branch, MD;  Location: [facility];  Service: Ophthalmology;  Laterality: Right;  CDE: 15.26  . CATARACT EXTRACTION W/PHACO  09/28/2012   Procedure: CATARACT EXTRACTION PHACO AND INTRAOCULAR LENS PLACEMENT (IOC);  Surgeon: Tonny Branch, MD;  Location: [facility];  Service: Ophthalmology;  Laterality: Left;  CDE:  21.60  . CHOLECYSTECTOMY  1990  . PORT-A-CATH REMOVAL N/A 04/26/2015   Procedure: REMOVAL PORT-A-CATH;  Surgeon: Aviva Signs, MD;  Location: [facility];  Service: General;  Laterality: N/A;  . PORTACATH  PLACEMENT  05/23/10  . THYROID SURGERY  1994  . TONSILLECTOMY    . TOTAL HIP ARTHROPLASTY  1996     reports that she quit smoking about 26 years ago. Her smoking use included cigarettes. She has a 90.00 pack-year smoking history. she has never used smokeless tobacco. She reports that she does not drink alcohol or use drugs.  Allergies  Allergen Reactions  . Penicillins  Anaphylaxis    Edema of throat  . Hctz [Hydrochlorothiazide]     gout  . Morphine And Related Itching  . Lotrel [Amlodipine Besy-Benazepril Hcl]     Facial muscles draw and break out in whilps    Family History  Problem Relation Age of Onset  . Kidney disease Mother   . Heart disease Father   . Hypertension Brother   . Cancer Sister        breast and lung    Prior to Admission medications   Medication Sig Start Date End Date Taking? Authorizing Provider  amLODipine (NORVASC) 5 MG tablet Take 5 mg by mouth daily.    Yes [provider]  aspirin EC 81 MG tablet Take 81 mg by mouth daily.   Yes [provider]  Calcium-Magnesium-Vitamin D (CALCIUM 500 PO) Take 1 tablet by mouth daily.   Yes [provider]  carvedilol (COREG) 25 MG tablet Take 25 mg by mouth 2 (two) times daily with a meal.     Yes [provider]  Cholecalciferol (VITAMIN D-3) 1000 units CAPS Take 1 capsule by mouth daily.   Yes [provider]  DiphenhydrAMINE HCl, Sleep, (NIGHTTIME SLEEP AID) 50 MG CAPS Take 1 capsule by mouth at bedtime.    Yes [provider]  ibuprofen (ADVIL,MOTRIN) 200 MG tablet Take 400 mg by mouth every 6 (six) hours as needed. For pain   Yes [provider]  levothyroxine (SYNTHROID, LEVOTHROID) 125 MCG tablet Take 125 mcg by mouth daily.     Yes [provider]  losartan (COZAAR) 50 MG tablet Take 50 mg by mouth daily.     Yes [provider]  SPIRIVA HANDIHALER 18 MCG inhalation capsule Place 18 mcg into inhaler and inhale daily. 10/02/15  Yes [provider]  alendronate (FOSAMAX) 70 MG tablet Take 70 mg by mouth every 7 (seven) days. Take with a full glass of water on an empty stomach. **Takes on Wednesday**    [provider]    Physical Exam: Vitals:   09/02/17 2000 09/02/17 2005 09/02/17 2030 09/02/17 2100  BP: 122/61 122/61 (!) 108/53 (!) 115/51  Pulse: 65 79 66 66  Resp: (!) 22 (!)  23 18 (!) 21  Temp:      TempSrc:      SpO2: 95% 95% 96% 94%  Weight:        Constitutional: NAD, calm, comfortable Eyes: PERRL, lids and conjunctivae normal ENMT: Mucous membranes are moist. Posterior pharynx clear of any exudate or lesions. Neck: normal, supple, no masses, no thyromegaly Respiratory: Decreased breath sounds on both bases, left > right, subtle wheezing, left base crackles. Normal respiratory effort. No accessory muscle use.  Cardiovascular: Regular rate and rhythm, no murmurs / rubs / gallops. No extremity edema. 2+ pedal pulses. No carotid bruits.  Abdomen: Soft, no tenderness, no masses palpated. No hepatosplenomegaly. Bowel sounds positive.  Musculoskeletal: no clubbing / cyanosis.Good ROM, no contractures. Normal muscle tone.  Skin: Multiple hyperpigmented verrucous growths on dorsal torso. Neurologic: CN 2-12 grossly intact. Sensation intact, DTR  normal. Strength 5/5 in all 4.  Psychiatric: Normal judgment and insight. Alert and oriented x 3. Normal mood.    Labs on Admission: I have personally reviewed following labs and imaging studies  CBC: Recent Labs  Lab 09/02/17 1727  WBC 16.3*  NEUTROABS 14.6*  HGB 9.3*  HCT 29.0*  MCV 89.5  PLT 846   Basic Metabolic Panel: Recent Labs  Lab 09/02/17 1727 09/02/17 1919  NA 137  --   K 3.6  --   CL 96*  --   CO2 27  --   GLUCOSE 120*  --   BUN 33*  --   CREATININE 1.23*  --   CALCIUM 9.7  --   MG  --  1.2*  PHOS  --  3.2   GFR: CrCl cannot be calculated (Unknown ideal weight.). Liver Function Tests: Recent Labs  Lab 09/02/17 1727  AST 43*  ALT 21  ALKPHOS 54  BILITOT 0.7  PROT 7.1  ALBUMIN 3.2*   No results for input(s): LIPASE, AMYLASE in the last 168 hours. No results for input(s): AMMONIA in the last 168 hours. Coagulation Profile: No results for input(s): INR, PROTIME in the last 168 hours. Cardiac Enzymes: Recent Labs  Lab 09/02/17 1727  TROPONINI 0.11*   BNP (last 3  results) No results for input(s): PROBNP in the last 8760 hours. HbA1C: No results for input(s): HGBA1C in the last 72 hours. CBG: No results for input(s): GLUCAP in the last 168 hours. Lipid Profile: No results for input(s): CHOL, HDL, LDLCALC, TRIG, CHOLHDL, LDLDIRECT in the last 72 hours. Thyroid Function Tests: No results for input(s): TSH, T4TOTAL, FREET4, T3FREE, THYROIDAB in the last 72 hours. Anemia Panel: No results for input(s): VITAMINB12, FOLATE, FERRITIN, TIBC, IRON, RETICCTPCT in the last 72 hours. Urine analysis:    Component Value Date/Time   COLORURINE YELLOW 04/20/2010 [place] 04/20/2010 1123   LABSPEC 1.006 04/20/2010 1123   PHURINE 6.5 04/20/2010 1123   GLUCOSEU NEGATIVE 04/20/2010 1123   HGBUR NEGATIVE 04/20/2010 [place] 04/20/2010 [place] 04/20/2010 [place] 04/20/2010 1123   UROBILINOGEN 0.2 04/20/2010 1123   NITRITE NEGATIVE 04/20/2010 1123   LEUKOCYTESUR SMALL (A) 04/20/2010 1123    Radiological Exams on Admission: Dg Chest 2 View  Result Date: 09/02/2017 CLINICAL DATA:  Fever and shortness of breath for several days EXAM: CHEST  2 VIEW COMPARISON:  June 26, 2011 FINDINGS: The heart size and mediastinal contours are within normal limits. There is patchy consolidation of left lung base. There is probable calcified granuloma of the right lung base. There is mild increased pulmonary interstitium. Small left pleural effusion is identified. The visualized skeletal structures are unremarkable. IMPRESSION: Left lung base pneumonia with pleural effusion. Probable mild interstitial edema. Electronically Signed   By: Abelardo Diesel M.D.   On: 09/02/2017 18:17   05/14/2011 echocardiogram  -------------------------------------------------------------------- LV EF: 55% -  60%  -------------------------------------------------------------------- Indications:  V58.11 Chemotherapy  Evaluation.  -------------------------------------------------------------------- History: PMH: Breast cancer, on Herceptin Risk factors: Hypertension. Diabetes mellitus.  -------------------------------------------------------------------- Study Conclusions  - Left ventricle: The cavity size was normal. Wall thickness was normal. Systolic function was normal. The estimated ejection fraction was in the range of 55% to 60%. Wall motion was normal; there were no regional wall motion abnormalities. - Mitral valve: Calcified annulus. - Right ventricle: The cavity size was normal. Wall thickness was mildly increased. - Atrial septum: No defect or patent foramen  ovale was identified. Impressions:  - Compared to the prior study performed 02/11/11, there has been no significant interval change.  EKG: Independently reviewed. Vent. rate 65 BPM PR interval * ms QRS duration 104 ms QT/QTc 417/434 ms P-R-T axes 60 46 46 Sinus rhythm  Assessment/Plan Principal Problem:   CAP (community acquired pneumonia) Telemetry/inpatient. Continue supplemental oxygen. Check sputum Gram stain, culture and sensitivity if available. Check strep pneumoniae urinary antigen. Follow-up blood cultures and sensitivity. Continue Levaquin 750 mg IVPB every 24 hours.  Active Problems:   Chronic obstructive pulmonary disease (HCC) Continue supplemental oxygen. Continue scheduled and as needed bronchodilators.    Elevated troponin Continue cardiac telemetry. Trend troponin level. Check EKG in a.m. Check echocardiogram in the morning.    Controlled type 2 diabetes mellitus without complication (HCC) Carbohydrate modified diet. CBG monitoring with regular insulin sliding scale while in the hospital.    Adult hypothyroidism Continue levothyroxine 125 mcg p.o. daily. Follow-up TSH as needed.    HLD (hyperlipidemia) Continue atorvastatin 10 mg p.o. daily.    Essential (primary)  hypertension Continue amlodipine 5 mg p.o. daily. Continue carvedilol 25 mg p.o. twice daily. Continue losartan 50 mg p.o. daily. Monitor blood pressure, heart rate, renal function and electrolytes.    Anemia Check anemia panel in a.m. Monitor hematocrit and hemoglobin.    Hypomagnesemia Replacing.    DVT prophylaxis: Lovenox SQ. Code Status: Full code Family Communication: Her daughter Threasa Beards was pressing in the ED room. Disposition Plan: Admit for pneumonia treatment for 2-3 days. Consults called: Cardiology counseling a.m. Admission status: Inpatient/telemetry.   Reubin Milan MD [facility] Pager [phone].  If 7PM-7AM, please contact night-coverage [email or web address] Password [facility]  09/02/2017, 9:40 PM   This document was prepared using Dragon voice recognition software may contain some unintended errors.

## 2017-09-02 NOTE — ED Notes (Signed)
Date and time results received: 09/02/17 6:38 PM Test: troponin Critical Value: 0.11 Name of Provider Notified: Dr.Cook Orders Received? Or Actions Taken?: MD notified.

## 2017-09-03 ENCOUNTER — Inpatient Hospital Stay (HOSPITAL_COMMUNITY): Payer: Medicare Other

## 2017-09-03 ENCOUNTER — Other Ambulatory Visit (HOSPITAL_COMMUNITY): Payer: Medicare Other

## 2017-09-03 DIAGNOSIS — I10 Essential (primary) hypertension: Secondary | ICD-10-CM

## 2017-09-03 LAB — BASIC METABOLIC PANEL
ANION GAP: 9 (ref 5–15)
BUN: 39 mg/dL — AB (ref 6–20)
CO2: 30 mmol/L (ref 22–32)
Calcium: 9.3 mg/dL (ref 8.9–10.3)
Chloride: 94 mmol/L — ABNORMAL LOW (ref 101–111)
Creatinine, Ser: 1.32 mg/dL — ABNORMAL HIGH (ref 0.44–1.00)
GFR calc Af Amer: 42 mL/min — ABNORMAL LOW (ref >60)
GFR, EST NON AFRICAN AMERICAN: 36 mL/min — AB (ref >60)
GLUCOSE: 158 mg/dL — AB (ref 65–99)
Potassium: 3.8 mmol/L (ref 3.5–5.1)
Sodium: 133 mmol/L — ABNORMAL LOW (ref 135–145)

## 2017-09-03 LAB — RESPIRATORY PANEL BY PCR
Adenovirus: NOT DETECTED
Bordetella pertussis: NOT DETECTED
CORONAVIRUS HKU1-RVPPCR: NOT DETECTED
CORONAVIRUS OC43-RVPPCR: NOT DETECTED
Chlamydophila pneumoniae: NOT DETECTED
Coronavirus 229E: NOT DETECTED
Coronavirus NL63: NOT DETECTED
INFLUENZA A-RVPPCR: NOT DETECTED
Influenza B: NOT DETECTED
METAPNEUMOVIRUS-RVPPCR: NOT DETECTED
Mycoplasma pneumoniae: NOT DETECTED
PARAINFLUENZA VIRUS 1-RVPPCR: NOT DETECTED
PARAINFLUENZA VIRUS 2-RVPPCR: NOT DETECTED
PARAINFLUENZA VIRUS 3-RVPPCR: NOT DETECTED
PARAINFLUENZA VIRUS 4-RVPPCR: NOT DETECTED
RHINOVIRUS / ENTEROVIRUS - RVPPCR: NOT DETECTED
Respiratory Syncytial Virus: NOT DETECTED

## 2017-09-03 LAB — TROPONIN I
Troponin I: 0.05 ng/mL (ref <0.03)
Troponin I: 0.05 ng/mL (ref <0.03)

## 2017-09-03 LAB — GLUCOSE, CAPILLARY
GLUCOSE-CAPILLARY: 158 mg/dL — AB (ref 65–99)
Glucose-Capillary: 171 mg/dL — ABNORMAL HIGH (ref 65–99)
Glucose-Capillary: 181 mg/dL — ABNORMAL HIGH (ref 65–99)
Glucose-Capillary: 164 mg/dL — ABNORMAL HIGH (ref 65–99)

## 2017-09-03 LAB — CBC WITH DIFFERENTIAL/PLATELET
BASOS ABS: 0 10*3/uL (ref 0.0–0.1)
BASOS PCT: 0 %
Eosinophils Absolute: 0 10*3/uL (ref 0.0–0.7)
Eosinophils Relative: 0 %
HEMATOCRIT: 28.9 % — AB (ref 36.0–46.0)
HEMOGLOBIN: 9.2 g/dL — AB (ref 12.0–15.0)
LYMPHS ABS: 1.4 10*3/uL (ref 0.7–4.0)
LYMPHS PCT: 7 %
MCH: 28.3 pg (ref 26.0–34.0)
MCHC: 31.8 g/dL (ref 30.0–36.0)
MCV: 88.9 fL (ref 78.0–100.0)
Monocytes Absolute: 0.2 10*3/uL (ref 0.1–1.0)
Monocytes Relative: 1 %
NEUTROS ABS: 18.2 10*3/uL — AB (ref 1.7–7.7)
Neutrophils Relative %: 92 %
Platelets: 186 10*3/uL (ref 150–400)
RBC: 3.25 MIL/uL — ABNORMAL LOW (ref 3.87–5.11)
RDW: 14 % (ref 11.5–15.5)
WBC: 19.8 10*3/uL — ABNORMAL HIGH (ref 4.0–10.5)

## 2017-09-03 LAB — ECHOCARDIOGRAM COMPLETE
Height: 62 in
Weight: 2800 oz

## 2017-09-03 LAB — STREP PNEUMONIAE URINARY ANTIGEN: STREP PNEUMO URINARY ANTIGEN: NEGATIVE

## 2017-09-03 MED ORDER — LEVOFLOXACIN IN D5W 500 MG/100ML IV SOLN
500.0000 mg | INTRAVENOUS | Status: DC
  Administered 2017-09-03: 500 mg via INTRAVENOUS
  Filled 2017-09-03: qty 100

## 2017-09-03 MED ORDER — PERFLUTREN LIPID MICROSPHERE
1.0000 mL | INTRAVENOUS | Status: AC | PRN
  Administered 2017-09-03: 5.5 mL via INTRAVENOUS

## 2017-09-03 NOTE — Progress Notes (Signed)
  Echocardiogram 2D Echocardiogram with definity has been performed.  Darlina Sicilian M 09/03/2017, 2:20 PM

## 2017-09-03 NOTE — Progress Notes (Signed)
PHARMACY NOTE:  ANTIMICROBIAL RENAL DOSAGE ADJUSTMENT  Current antimicrobial regimen includes a mismatch between antimicrobial dosage and estimated renal function.  As per policy approved by the Pharmacy & Therapeutics and Medical Executive Committees, the antimicrobial dosage will be adjusted accordingly.  Current antimicrobial dosage:  Levaquin 750mg  IV every 24 hours.  Indication: CAP (community acquired pneumonia)  Renal Function:  Estimated Creatinine Clearance: 31.5 mL/min (A) (by C-G formula based on SCr of 1.32 mg/dL (H)). []      On intermittent HD, scheduled: []      On CRRT    Antimicrobial dosage has been changed to:  Levaquin 500mg  IV every 24 hours.  Additional comments: n/a   Thank you for allowing pharmacy to be a part of this patient's care.  Pricilla Larsson, [facility] 09/03/2017 8:54 AM

## 2017-09-03 NOTE — Progress Notes (Signed)
PROGRESS NOTE    VALINCIA TOUCH  ZOX:[id number] DOB: 1932-06-12 DOA: 09/02/2017 PCP: [name], MD     Brief Narrative:  84 year old woman admitted from home on 1/1 due to fever and body aches.  Thought to have pneumonia and a COPD exacerbation and admission was requested.   Assessment & Plan:   Principal Problem:   CAP (community acquired pneumonia) Active Problems:   Controlled type 2 diabetes mellitus without complication ([place])   Adult hypothyroidism   HLD (hyperlipidemia)   Essential (primary) hypertension   Chronic obstructive pulmonary disease (HCC)   Elevated troponin   Anemia   Hypomagnesemia   Community-acquired pneumonia -Continue Levaquin, culture data pending. -Symptomatically improved.  COPD exacerbation -Mild, only required one steroid dose in the ED. -No active wheezing today. -Continue scheduled and as needed bronchodilators.  Elevated troponin -No EKG abnormalities.  Echo ordered and pending. -Troponin has been flat, patient without chest pain. -As long as Echo shows normal EF and no wall motion abnormalities, do not anticipate further cardiac workup this admission.  Hypothyroidism -Continue Synthroid.  Hyperlipidemia -Continue Lipitor.  Hypertension -Well-controlled, continue amlodipine, carvedilol, losartan.  Type 2 diabetes -Fair control, continue current management.   DVT prophylaxis: Lovenox Code Status: Full code Family Communication: Daughter at bedside updated on plan of care and questions answered Disposition Plan: Home when ready, anticipate 24 hours  Consultants:   None  Procedures:   Echo pending  Antimicrobials:  Anti-infectives (From admission, onward)   Start     Dose/Rate Route Frequency Ordered Stop   09/03/17 2000  levofloxacin (LEVAQUIN) IVPB 750 mg  Status:  Discontinued     750 mg 100 mL/hr over 90 Minutes Intravenous Every 24 hours 09/02/17 2131 09/03/17 0858   09/03/17 2000  levofloxacin (LEVAQUIN) IVPB  500 mg     500 mg 100 mL/hr over 60 Minutes Intravenous Every 24 hours 09/03/17 0858 09/09/17 1959   09/02/17 1900  levofloxacin (LEVAQUIN) IVPB 500 mg     500 mg 100 mL/hr over 60 Minutes Intravenous  Once 09/02/17 1859 09/02/17 2008   09/02/17 1830  azithromycin (ZITHROMAX) 500 mg in dextrose 5 % 250 mL IVPB  Status:  Discontinued     500 mg 250 mL/hr over 60 Minutes Intravenous Every 24 hours 09/02/17 1823 09/02/17 1859       Subjective: Feels much improved, less short of breath, less achy.  Objective: Vitals:   09/02/17 2143 09/02/17 2218 09/03/17 0656 09/03/17 1010  BP: (!) 123/51  (!) 112/59   Pulse: 67  60   Resp: 18  20   Temp: 98.2 F (36.8 C)  (!) 97.4 F (36.3 C)   TempSrc: Oral  Oral   SpO2: 98% 94% 94% 96%  Weight:      Height: 5\' 2"  (1.575 m)       Intake/Output Summary (Last 24 hours) at 09/03/2017 1518 Last data filed at 09/03/2017 1458 Gross per 24 hour  Intake 100 ml  Output 500 ml  Net -400 ml   Filed Weights   09/02/17 1625  Weight: 79.4 kg (175 lb)    Examination:  General exam: Alert, awake, oriented x 3 Respiratory system: Clear to auscultation. Respiratory effort normal. Cardiovascular system:RRR. No murmurs, rubs, gallops. Gastrointestinal system: Abdomen is nondistended, soft and nontender. No organomegaly or masses felt. Normal bowel sounds heard. Central nervous system: Alert and oriented. No focal neurological deficits. Extremities: No C/C/E, +pedal pulses Skin: No rashes, lesions or ulcers Psychiatry: Judgement and insight appear normal.  Mood & affect appropriate.     Data Reviewed: I have personally reviewed following labs and imaging studies  CBC: Recent Labs  Lab 09/02/17 1727 09/03/17 0316  WBC 16.3* 19.8*  NEUTROABS 14.6* 18.2*  HGB 9.3* 9.2*  HCT 29.0* 28.9*  MCV 89.5 88.9  PLT 188 696   Basic Metabolic Panel: Recent Labs  Lab 09/02/17 1727 09/02/17 1919 09/03/17 0316  NA 137  --  133*  K 3.6  --  3.8  CL  96*  --  94*  CO2 27  --  30  GLUCOSE 120*  --  158*  BUN 33*  --  39*  CREATININE 1.23*  --  1.32*  CALCIUM 9.7  --  9.3  MG  --  1.2*  --   PHOS  --  3.2  --    GFR: Estimated Creatinine Clearance: 31.5 mL/min (A) (by C-G formula based on SCr of 1.32 mg/dL (H)). Liver Function Tests: Recent Labs  Lab 09/02/17 1727  AST 43*  ALT 21  ALKPHOS 54  BILITOT 0.7  PROT 7.1  ALBUMIN 3.2*   No results for input(s): LIPASE, AMYLASE in the last 168 hours. No results for input(s): AMMONIA in the last 168 hours. Coagulation Profile: No results for input(s): INR, PROTIME in the last 168 hours. Cardiac Enzymes: Recent Labs  Lab 09/02/17 1727 09/02/17 2136 09/03/17 0316 09/03/17 0944  TROPONINI 0.11* 0.08* 0.05* 0.05*   BNP (last 3 results) No results for input(s): PROBNP in the last 8760 hours. HbA1C: No results for input(s): HGBA1C in the last 72 hours. CBG: Recent Labs  Lab 09/02/17 2200 09/03/17 0827 09/03/17 1151  GLUCAP 121* 158* 171*   Lipid Profile: No results for input(s): CHOL, HDL, LDLCALC, TRIG, CHOLHDL, LDLDIRECT in the last 72 hours. Thyroid Function Tests: No results for input(s): TSH, T4TOTAL, FREET4, T3FREE, THYROIDAB in the last 72 hours. Anemia Panel: No results for input(s): VITAMINB12, FOLATE, FERRITIN, TIBC, IRON, RETICCTPCT in the last 72 hours. Urine analysis:    Component Value Date/Time   COLORURINE YELLOW 04/20/2010 [place] 04/20/2010 1123   LABSPEC 1.006 04/20/2010 1123   PHURINE 6.5 04/20/2010 1123   GLUCOSEU NEGATIVE 04/20/2010 1123   HGBUR NEGATIVE 04/20/2010 1123   BILIRUBINUR NEGATIVE 04/20/2010 1123   KETONESUR NEGATIVE 04/20/2010 1123   PROTEINUR NEGATIVE 04/20/2010 1123   UROBILINOGEN 0.2 04/20/2010 1123   NITRITE NEGATIVE 04/20/2010 1123   LEUKOCYTESUR SMALL (A) 04/20/2010 1123   Sepsis Labs: @LABRCNTIP (procalcitonin:4,lacticidven:4)  )No results found for this or any previous visit (from the past 240  hour(s)).       Radiology Studies: Dg Chest 2 View  Result Date: 09/02/2017 CLINICAL DATA:  Fever and shortness of breath for several days EXAM: CHEST  2 VIEW COMPARISON:  June 26, 2011 FINDINGS: The heart size and mediastinal contours are within normal limits. There is patchy consolidation of left lung base. There is probable calcified granuloma of the right lung base. There is mild increased pulmonary interstitium. Small left pleural effusion is identified. The visualized skeletal structures are unremarkable. IMPRESSION: Left lung base pneumonia with pleural effusion. Probable mild interstitial edema. Electronically Signed   By: Abelardo Diesel M.D.   On: 09/02/2017 18:17        Scheduled Meds: . amLODipine  5 mg Oral Daily  . aspirin EC  81 mg Oral Daily  . carvedilol  25 mg Oral BID WC  . diphenhydrAMINE  50 mg Oral QHS  . enoxaparin (LOVENOX) injection  40 mg Subcutaneous Q24H  . insulin aspart  0-9 Units Subcutaneous TID WC  . ipratropium-albuterol  3 mL Nebulization Q6H  . levothyroxine  125 mcg Oral QAC breakfast  . losartan  50 mg Oral Daily  . mouth rinse  15 mL Mouth Rinse BID   Continuous Infusions: . levofloxacin (LEVAQUIN) IV       LOS: 1 day    Time spent: 25 minutes. Greater than 50% of this time was spent in direct contact with the patient coordinating care.     Lelon Frohlich, MD [facility] Pager [phone]  If 7PM-7AM, please contact night-coverage [email or web address] Password [facility] 09/03/2017, 3:18 PM

## 2017-09-03 NOTE — Plan of Care (Signed)
Pt labored with activity; prefers bedside commode. Pt denies cough, will continue to monitor.

## 2017-09-04 DIAGNOSIS — R748 Abnormal levels of other serum enzymes: Secondary | ICD-10-CM

## 2017-09-04 DIAGNOSIS — J449 Chronic obstructive pulmonary disease, unspecified: Secondary | ICD-10-CM

## 2017-09-04 DIAGNOSIS — E039 Hypothyroidism, unspecified: Secondary | ICD-10-CM

## 2017-09-04 DIAGNOSIS — I10 Essential (primary) hypertension: Secondary | ICD-10-CM

## 2017-09-04 DIAGNOSIS — E785 Hyperlipidemia, unspecified: Secondary | ICD-10-CM

## 2017-09-04 DIAGNOSIS — J189 Pneumonia, unspecified organism: Secondary | ICD-10-CM

## 2017-09-04 LAB — BASIC METABOLIC PANEL
ANION GAP: 13 (ref 5–15)
BUN: 66 mg/dL — ABNORMAL HIGH (ref 6–20)
CALCIUM: 9.4 mg/dL (ref 8.9–10.3)
CO2: 28 mmol/L (ref 22–32)
CREATININE: 1.44 mg/dL — AB (ref 0.44–1.00)
Chloride: 96 mmol/L — ABNORMAL LOW (ref 101–111)
GFR, EST AFRICAN AMERICAN: 38 mL/min — AB (ref >60)
GFR, EST NON AFRICAN AMERICAN: 33 mL/min — AB (ref >60)
Glucose, Bld: 175 mg/dL — ABNORMAL HIGH (ref 65–99)
Potassium: 3.4 mmol/L — ABNORMAL LOW (ref 3.5–5.1)
SODIUM: 137 mmol/L (ref 135–145)

## 2017-09-04 LAB — URINALYSIS, ROUTINE W REFLEX MICROSCOPIC
Bacteria, UA: NONE SEEN
Bilirubin Urine: NEGATIVE
GLUCOSE, UA: NEGATIVE mg/dL
HGB URINE DIPSTICK: NEGATIVE
Ketones, ur: NEGATIVE mg/dL
NITRITE: NEGATIVE
PH: 5 (ref 5.0–8.0)
Protein, ur: NEGATIVE mg/dL
RBC / HPF: NONE SEEN RBC/hpf (ref 0–5)
SPECIFIC GRAVITY, URINE: 1.012 (ref 1.005–1.030)

## 2017-09-04 LAB — CBC WITH DIFFERENTIAL/PLATELET
BASOS ABS: 0 10*3/uL (ref 0.0–0.1)
BASOS PCT: 0 %
EOS ABS: 0 10*3/uL (ref 0.0–0.7)
Eosinophils Relative: 0 %
HCT: 31.7 % — ABNORMAL LOW (ref 36.0–46.0)
HEMOGLOBIN: 10.2 g/dL — AB (ref 12.0–15.0)
Lymphocytes Relative: 6 %
Lymphs Abs: 1.2 10*3/uL (ref 0.7–4.0)
MCH: 28.6 pg (ref 26.0–34.0)
MCHC: 32.2 g/dL (ref 30.0–36.0)
MCV: 88.8 fL (ref 78.0–100.0)
Monocytes Absolute: 0.8 10*3/uL (ref 0.1–1.0)
Monocytes Relative: 5 %
NEUTROS ABS: 16.5 10*3/uL — AB (ref 1.7–7.7)
NEUTROS PCT: 89 %
Platelets: 244 10*3/uL (ref 150–400)
RBC: 3.57 MIL/uL — AB (ref 3.87–5.11)
RDW: 13.9 % (ref 11.5–15.5)
WBC: 18.5 10*3/uL — AB (ref 4.0–10.5)

## 2017-09-04 LAB — GLUCOSE, CAPILLARY
GLUCOSE-CAPILLARY: 158 mg/dL — AB (ref 65–99)
GLUCOSE-CAPILLARY: 134 mg/dL — AB (ref 65–99)

## 2017-09-04 MED ORDER — POTASSIUM CHLORIDE CRYS ER 20 MEQ PO TBCR
20.0000 meq | EXTENDED_RELEASE_TABLET | Freq: Once | ORAL | Status: AC
  Administered 2017-09-04: 20 meq via ORAL
  Filled 2017-09-04: qty 1

## 2017-09-04 MED ORDER — ENOXAPARIN SODIUM 30 MG/0.3ML ~~LOC~~ SOLN: 30.0000 mg | SUBCUTANEOUS | Status: DC

## 2017-09-04 MED ORDER — LEVOFLOXACIN 500 MG PO TABS: 500.0000 mg | ORAL_TABLET | Freq: Every day | ORAL | 0 refills | Status: DC

## 2017-09-04 MED ORDER — LEVOFLOXACIN IN D5W 500 MG/100ML IV SOLN: 500.0000 mg | INTRAVENOUS | Status: DC

## 2017-09-04 MED ORDER — LEVOFLOXACIN 500 MG PO TABS: 500.0000 mg | ORAL_TABLET | Freq: Every day | ORAL | 0 refills | Status: AC

## 2017-09-04 NOTE — Progress Notes (Signed)
PHARMACY NOTE:  ANTIMICROBIAL RENAL DOSAGE ADJUSTMENT  Current antimicrobial regimen includes a mismatch between antimicrobial dosage and estimated renal function.  As per policy approved by the Pharmacy & Therapeutics and Medical Executive Committees, the antimicrobial dosage will be adjusted accordingly.  Current antimicrobial dosage:  Levaquin 750mg  IV every 24 hours.  Indication: CAP (community acquired pneumonia)  Renal Function:  Estimated Creatinine Clearance: 28.9 mL/min (A) (by C-G formula based on SCr of 1.44 mg/dL (H)). []      On intermittent HD, scheduled: []      On CRRT    Antimicrobial dosage has been changed to:  Levaquin 500mg  IV every 48 hours.  Additional comments: n/a   Thank you for allowing pharmacy to be a part of this patient's care.  Cristy Friedlander, [facility] 09/04/2017 11:40 AM

## 2017-09-04 NOTE — Discharge Instructions (Signed)
Follow with Primary MD  Fagan, Roy, MD  and other consultant's as instructed your Hospitalist MD ° °Please get a complete blood count and chemistry panel checked by your Primary MD at your next visit, and again as instructed by your Primary MD. ° °Get Medicines reviewed and adjusted: °Please take all your medications with you for your next visit with your Primary MD ° °Laboratory/radiological data: °Please request your Primary MD to go over all hospital tests and procedure/radiological results at the follow up, please ask your Primary MD to get all Hospital records sent to his/her office. ° °In some cases, they will be blood work, cultures and biopsy results pending at the time of your discharge. Please request that your primary care M.D. follows up on these results. ° °Also Note the following: °If you experience worsening of your admission symptoms, develop shortness of breath, life threatening emergency, suicidal or homicidal thoughts you must seek medical attention immediately by calling 911 or calling your MD immediately  if symptoms less severe. ° °You must read complete instructions/literature along with all the possible adverse reactions/side effects for all the Medicines you take and that have been prescribed to you. Take any new Medicines after you have completely understood and accpet all the possible adverse reactions/side effects.  ° °Do not drive when taking Pain medications or sleeping medications (Benzodaizepines) ° °Do not take more than prescribed Pain, Sleep and Anxiety Medications. It is not advisable to combine anxiety,sleep and pain medications without talking with your primary care practitioner ° °Special Instructions: If you have smoked or chewed Tobacco  in the last 2 yrs please stop smoking, stop any regular Alcohol  and or any Recreational drug use. ° °Wear Seat belts while driving. ° °Please note: °You were cared for by a hospitalist during your hospital stay. Once you are discharged, your  primary care physician will handle any further medical issues. Please note that NO REFILLS for any discharge medications will be authorized once you are discharged, as it is imperative that you return to your primary care physician (or establish a relationship with a primary care physician if you do not have one) for your post hospital discharge needs so that they can reassess your need for medications and monitor your lab values. ° ° ° ° °

## 2017-09-04 NOTE — Progress Notes (Signed)
IV discontinued,catheter intact. Discharged instructions given on medications,and follow up visits,patient verbalized understanding. Prescriptions sent to Pharmacy of choice documented on AVS. Accompanied by staff to an awaiting vehicle.

## 2017-09-04 NOTE — Discharge Summary (Signed)
Physician Discharge Summary  Paige Rhodes:[id number] DOB: 09/29/1931 DOA: 09/02/2017  PCP: [name], MD  Admit date: 09/02/2017 Discharge date: 09/04/2017  Admitted From: Home  Disposition: Home  Recommendations for Outpatient Follow-up:  1. Follow up with PCP in 1 weeks 2. Please obtain BMP/CBC in one week 3. Please follow up on the following pending results: final culture data  Discharge Condition: STABLE   CODE STATUS: FULL    Brief Hospitalization Summary: Please see all hospital notes, images, labs for full details of the hospitalization. HPI: Paige Rhodes is a 84 y.o. female with medical history significant of breast cancer, chronic renal disease, COPD, type 2 diabetes, glaucoma, gout, hyperlipidemia, hypertension, hypothyroidism, osteoporosis who is coming to the emergency department with complaints of fever, progressively worse body aches, fatigue, malaise, decreased appetite and mild dyspnea since Monday.  She denies earaches, sore throat, productive cough, wheezing, hemoptysis, chest pain, palpitations, diaphoresis, PND, orthopnea or pitting edema lower extremities.  She mentions that she has had some positional dizziness when getting out of bed and standing up from sitting position, which her daughter attributes to decrease fluid and overall oral intake over the past few days.  No abdominal pain, nausea, emesis, diarrhea, melena, hematochezia or constipation.  Denies dysuria, frequency or hematuria.  No blurred vision, polyuria, polydipsia or polyphagia.  She denies skin rashes or pruritus.  There is no travel history or known sick contacts.  ED Course: Initial vital signs temperature 37.1C (98.8 F), pulse 78, respirations 28, blood pressure 126/46 mmHg and O2 sat 82% on room air.  Her white count was 16.3 with 90% neutrophils, hemoglobin 9.3 g/dL and platelets 188.  BNP was 398 pg/mL.  EKG was normal sinus rhythm.  First troponin 0.11 ng/mL.  Sodium 137, potassium 3.6,  chloride 96 and CO2 27.  Her anion gap is 14.  Her glucose is 120, BUN 33, creatinine 1.23, calcium 9.7, magnesium 1.2 and phosphorus 3.2 mg/dL.  Her albumin was mildly decreased at 3.2 g/dL and AST is slightly increased at 43 U/L.  All other LFTs values are normal.    Her chest radiograph shows left lower lobe infiltrate with pleural effusion.  Mild interstitial edema.  Please see images and full radiology report for further details.  Brief Narrative:  84 year old woman admitted from home on 1/1 due to fever and body aches.  Thought to have pneumonia and a COPD exacerbation and admission was requested.  Assessment & Plan:   Principal Problem:   CAP (community acquired pneumonia) Active Problems:   Controlled type 2 diabetes mellitus without complication ([place])   Adult hypothyroidism   HLD (hyperlipidemia)   Essential (primary) hypertension   Chronic obstructive pulmonary disease (HCC)   Elevated troponin   Anemia   Hypomagnesemia   Community-acquired pneumonia -Continue Levaquin, culture data no growth to date. -Symptomatically improved.  COPD exacerbation -Mild, only required one steroid dose in the ED. -No active wheezing today. -resume home bronchodilators.  Elevated troponin -No EKG abnormalities.  Echo ordered and pending. -Troponin has been flat, patient without chest pain. -As long as Echo shows normal EF and no wall motion abnormalities, do not anticipate further cardiac workup this admission.  Hypothyroidism -Continue Synthroid.  Hyperlipidemia -Continue Lipitor.  Frequent urination - Pt has appt with urology arranged.  Pt's urine is being tested, however given that she is already on antibiotics, doubt anything will grow in culture. Follow up outpatient.    Hypertension -Well-controlled, continue amlodipine, carvedilol, losartan.  Type 2 diabetes -  Fair control, continue current management.  CBG (last 3)  Recent Labs    09/03/17 1640  09/03/17 2019 09/04/17 0724  GLUCAP 181* 164* 158*    DVT prophylaxis: Lovenox Code Status: Full code Family Communication: Daughter at bedside updated on plan of care and questions answered Disposition Plan: Home  Discharge Diagnoses:  Principal Problem:   CAP (community acquired pneumonia) Active Problems:   Controlled type 2 diabetes mellitus without complication ([place])   Adult hypothyroidism   HLD (hyperlipidemia)   Essential (primary) hypertension   Chronic obstructive pulmonary disease (HCC)   Elevated troponin   Anemia   Hypomagnesemia  Discharge Instructions: Discharge Instructions    Call MD for:  difficulty breathing, headache or visual disturbances   Complete by:  As directed    Call MD for:  extreme fatigue   Complete by:  As directed    Call MD for:  persistant dizziness or light-headedness   Complete by:  As directed    Call MD for:  persistant nausea and vomiting   Complete by:  As directed    Call MD for:  severe uncontrolled pain   Complete by:  As directed    Diet - low sodium heart healthy   Complete by:  As directed    Increase activity slowly   Complete by:  As directed      Allergies as of 09/04/2017      Reactions   Penicillins Anaphylaxis   Edema of throat   Hctz [hydrochlorothiazide]    gout   Morphine And Related Itching   Lotrel [amlodipine Besy-benazepril Hcl]    Facial muscles draw and break out in whilps      Medication List    TAKE these medications   alendronate 70 MG tablet Commonly known as:  FOSAMAX Take 70 mg by mouth every 7 (seven) days. Take with a full glass of water on an empty stomach. **Takes on Wednesday**   amLODipine 5 MG tablet Commonly known as:  NORVASC Take 5 mg by mouth daily.   aspirin EC 81 MG tablet Take 81 mg by mouth daily.   CALCIUM 500 PO Take 1 tablet by mouth daily.   carvedilol 25 MG tablet Commonly known as:  COREG Take 25 mg by mouth 2 (two) times daily with a meal.   ibuprofen 200 MG  tablet Commonly known as:  ADVIL,MOTRIN Take 400 mg by mouth every 6 (six) hours as needed. For pain   levofloxacin 500 MG tablet Commonly known as:  LEVAQUIN Take 1 tablet (500 mg total) by mouth daily for 5 days.   levothyroxine 125 MCG tablet Commonly known as:  SYNTHROID, LEVOTHROID Take 125 mcg by mouth daily.   losartan 50 MG tablet Commonly known as:  COZAAR Take 50 mg by mouth daily.   NIGHTTIME SLEEP AID 50 MG Caps Generic drug:  DiphenhydrAMINE HCl (Sleep) Take 1 capsule by mouth at bedtime.   SPIRIVA HANDIHALER 18 MCG inhalation capsule Generic drug:  tiotropium Place 18 mcg into inhaler and inhale daily.   Vitamin D-3 1000 units Caps Take 1 capsule by mouth daily.      Follow-up Information    Irine Seal, MD. Go on 09/30/2017.   Specialty:  Urology Why:  Appt at 10:30 am. Please arrive 30 mins early.  Contact information: [address] [place] [address] [phone]        [name], MD. Schedule an appointment as soon as possible for a visit in 1  week(s).   Specialty:  Internal Medicine Why:  Hospital follow Up  Contact information: [address] [place] [address] [phone]          Allergies  Allergen Reactions  . Penicillins Anaphylaxis    Edema of throat  . Hctz [Hydrochlorothiazide]     gout  . Morphine And Related Itching  . Lotrel [Amlodipine Besy-Benazepril Hcl]     Facial muscles draw and break out in whilps   Allergies as of 09/04/2017      Reactions   Penicillins Anaphylaxis   Edema of throat   Hctz [hydrochlorothiazide]    gout   Morphine And Related Itching   Lotrel [amlodipine Besy-benazepril Hcl]    Facial muscles draw and break out in whilps      Medication List    TAKE these medications   alendronate 70 MG tablet Commonly known as:  FOSAMAX Take 70 mg by mouth every 7 (seven) days. Take with a full glass of water on an empty stomach. **Takes on Wednesday**   amLODipine 5 MG  tablet Commonly known as:  NORVASC Take 5 mg by mouth daily.   aspirin EC 81 MG tablet Take 81 mg by mouth daily.   CALCIUM 500 PO Take 1 tablet by mouth daily.   carvedilol 25 MG tablet Commonly known as:  COREG Take 25 mg by mouth 2 (two) times daily with a meal.   ibuprofen 200 MG tablet Commonly known as:  ADVIL,MOTRIN Take 400 mg by mouth every 6 (six) hours as needed. For pain   levofloxacin 500 MG tablet Commonly known as:  LEVAQUIN Take 1 tablet (500 mg total) by mouth daily for 5 days.   levothyroxine 125 MCG tablet Commonly known as:  SYNTHROID, LEVOTHROID Take 125 mcg by mouth daily.   losartan 50 MG tablet Commonly known as:  COZAAR Take 50 mg by mouth daily.   NIGHTTIME SLEEP AID 50 MG Caps Generic drug:  DiphenhydrAMINE HCl (Sleep) Take 1 capsule by mouth at bedtime.   SPIRIVA HANDIHALER 18 MCG inhalation capsule Generic drug:  tiotropium Place 18 mcg into inhaler and inhale daily.   Vitamin D-3 1000 units Caps Take 1 capsule by mouth daily.       Procedures/Studies: Dg Chest 2 View  Result Date: 09/02/2017 CLINICAL DATA:  Fever and shortness of breath for several days EXAM: CHEST  2 VIEW COMPARISON:  June 26, 2011 FINDINGS: The heart size and mediastinal contours are within normal limits. There is patchy consolidation of left lung base. There is probable calcified granuloma of the right lung base. There is mild increased pulmonary interstitium. Small left pleural effusion is identified. The visualized skeletal structures are unremarkable. IMPRESSION: Left lung base pneumonia with pleural effusion. Probable mild interstitial edema. Electronically Signed   By: Abelardo Diesel M.D.   On: 09/02/2017 18:17      Subjective: Pt says she is feeling better, no chest pain or SOB and has been ambulating.  Wants to go home.   Discharge Exam: Vitals:   09/04/17 0651 09/04/17 0816  BP: 122/71   Pulse: 64   Resp: 20   Temp: 98.2 F (36.8 C)   SpO2: 93%  93%   Vitals:   09/03/17 2125 09/03/17 2201 09/04/17 0651 09/04/17 0816  BP: 128/62  122/71   Pulse: 62  64   Resp: 20  20   Temp: 98.4 F (36.9 C)  98.2 F (36.8 C)   TempSrc: Oral  Oral   SpO2:  93% 92% 93% 93%  Weight:      Height:        General: Pt is alert, awake, not in acute distress Cardiovascular: RRR, S1/S2 +, no rubs, no gallops Respiratory: CTA bilaterally, no wheezing, no rhonchi Abdominal: Soft, NT, ND, bowel sounds + Extremities: no edema, no cyanosis   The results of significant diagnostics from this hospitalization (including imaging, microbiology, ancillary and laboratory) are listed below for reference.     Microbiology: Recent Results (from the past 240 hour(s))  Respiratory Panel by PCR     Status: None   Collection Time: 09/03/17 12:23 AM  Result Value Ref Range Status   Adenovirus NOT DETECTED NOT DETECTED Final   Coronavirus 229E NOT DETECTED NOT DETECTED Final   Coronavirus HKU1 NOT DETECTED NOT DETECTED Final   Coronavirus NL63 NOT DETECTED NOT DETECTED Final   Coronavirus OC43 NOT DETECTED NOT DETECTED Final   Metapneumovirus NOT DETECTED NOT DETECTED Final   Rhinovirus / Enterovirus NOT DETECTED NOT DETECTED Final   Influenza A NOT DETECTED NOT DETECTED Final   Influenza B NOT DETECTED NOT DETECTED Final   Parainfluenza Virus 1 NOT DETECTED NOT DETECTED Final   Parainfluenza Virus 2 NOT DETECTED NOT DETECTED Final   Parainfluenza Virus 3 NOT DETECTED NOT DETECTED Final   Parainfluenza Virus 4 NOT DETECTED NOT DETECTED Final   Respiratory Syncytial Virus NOT DETECTED NOT DETECTED Final   Bordetella pertussis NOT DETECTED NOT DETECTED Final   Chlamydophila pneumoniae NOT DETECTED NOT DETECTED Final   Mycoplasma pneumoniae NOT DETECTED NOT DETECTED Final    Comment: Performed at [facility], [place] [address] [place], [place] [address]     Labs: BNP (last 3 results) Recent Labs    09/02/17 1727  BNP 998.3*   Basic Metabolic  Panel: Recent Labs  Lab 09/02/17 1727 09/02/17 1919 09/03/17 0316 09/04/17 0546  NA 137  --  133* 137  K 3.6  --  3.8 3.4*  CL 96*  --  94* 96*  CO2 27  --  30 28  GLUCOSE 120*  --  158* 175*  BUN 33*  --  39* 66*  CREATININE 1.23*  --  1.32* 1.44*  CALCIUM 9.7  --  9.3 9.4  MG  --  1.2*  --   --   PHOS  --  3.2  --   --    Liver Function Tests: Recent Labs  Lab 09/02/17 1727  AST 43*  ALT 21  ALKPHOS 54  BILITOT 0.7  PROT 7.1  ALBUMIN 3.2*   No results for input(s): LIPASE, AMYLASE in the last 168 hours. No results for input(s): AMMONIA in the last 168 hours. CBC: Recent Labs  Lab 09/02/17 1727 09/03/17 0316 09/04/17 0546  WBC 16.3* 19.8* 18.5*  NEUTROABS 14.6* 18.2* 16.5*  HGB 9.3* 9.2* 10.2*  HCT 29.0* 28.9* 31.7*  MCV 89.5 88.9 88.8  PLT 188 186 244   Cardiac Enzymes: Recent Labs  Lab 09/02/17 1727 09/02/17 2136 09/03/17 0316 09/03/17 0944  TROPONINI 0.11* 0.08* 0.05* 0.05*   BNP: Invalid input(s): POCBNP CBG: Recent Labs  Lab 09/03/17 0827 09/03/17 1151 09/03/17 1640 09/03/17 2019 09/04/17 0724  GLUCAP 158* 171* 181* 164* 158*   D-Dimer No results for input(s): DDIMER in the last 72 hours. Hgb A1c No results for input(s): HGBA1C in the last 72 hours. Lipid Profile No results for input(s): CHOL, HDL, LDLCALC, TRIG, CHOLHDL, LDLDIRECT in the last 72 hours. Thyroid function studies No  results for input(s): TSH, T4TOTAL, T3FREE, THYROIDAB in the last 72 hours.  Invalid input(s): FREET3 Anemia work up No results for input(s): VITAMINB12, FOLATE, FERRITIN, TIBC, IRON, RETICCTPCT in the last 72 hours. Urinalysis    Component Value Date/Time   COLORURINE YELLOW 04/20/2010 1123   APPEARANCEUR CLEAR 04/20/2010 1123   LABSPEC 1.006 04/20/2010 1123   PHURINE 6.5 04/20/2010 1123   GLUCOSEU NEGATIVE 04/20/2010 1123   HGBUR NEGATIVE 04/20/2010 1123   [place] 04/20/2010 1123   KETONESUR NEGATIVE 04/20/2010 1123   PROTEINUR  NEGATIVE 04/20/2010 1123   UROBILINOGEN 0.2 04/20/2010 1123   NITRITE NEGATIVE 04/20/2010 1123   LEUKOCYTESUR SMALL (A) 04/20/2010 1123   Sepsis Labs Invalid input(s): PROCALCITONIN,  WBC,  LACTICIDVEN Microbiology Recent Results (from the past 240 hour(s))  Respiratory Panel by PCR     Status: None   Collection Time: 09/03/17 12:23 AM  Result Value Ref Range Status   Adenovirus NOT DETECTED NOT DETECTED Final   Coronavirus 229E NOT DETECTED NOT DETECTED Final   Coronavirus HKU1 NOT DETECTED NOT DETECTED Final   Coronavirus NL63 NOT DETECTED NOT DETECTED Final   Coronavirus OC43 NOT DETECTED NOT DETECTED Final   Metapneumovirus NOT DETECTED NOT DETECTED Final   Rhinovirus / Enterovirus NOT DETECTED NOT DETECTED Final   Influenza A NOT DETECTED NOT DETECTED Final   Influenza B NOT DETECTED NOT DETECTED Final   Parainfluenza Virus 1 NOT DETECTED NOT DETECTED Final   Parainfluenza Virus 2 NOT DETECTED NOT DETECTED Final   Parainfluenza Virus 3 NOT DETECTED NOT DETECTED Final   Parainfluenza Virus 4 NOT DETECTED NOT DETECTED Final   Respiratory Syncytial Virus NOT DETECTED NOT DETECTED Final   Bordetella pertussis NOT DETECTED NOT DETECTED Final   Chlamydophila pneumoniae NOT DETECTED NOT DETECTED Final   Mycoplasma pneumoniae NOT DETECTED NOT DETECTED Final    Comment: Performed at [facility], [place] [address], [place], [place] [address]   Time coordinating discharge: 33 minutes  SIGNED:  Irwin Brakeman, MD  [facility] 09/04/2017, 11:29 AM Pager [phone]  If 7PM-7AM, please contact night-coverage [email or web address] Password [email or web address]

## 2017-09-06 DIAGNOSIS — J449 Chronic obstructive pulmonary disease, unspecified: Secondary | ICD-10-CM | POA: Diagnosis not present

## 2017-09-16 DIAGNOSIS — J181 Lobar pneumonia, unspecified organism: Secondary | ICD-10-CM | POA: Diagnosis not present

## 2017-09-16 DIAGNOSIS — J9611 Chronic respiratory failure with hypoxia: Secondary | ICD-10-CM | POA: Diagnosis not present

## 2017-10-07 DIAGNOSIS — J449 Chronic obstructive pulmonary disease, unspecified: Secondary | ICD-10-CM | POA: Diagnosis not present

## 2017-10-15 ENCOUNTER — Other Ambulatory Visit (HOSPITAL_COMMUNITY): Payer: Self-pay | Admitting: Internal Medicine

## 2017-10-15 ENCOUNTER — Ambulatory Visit (HOSPITAL_COMMUNITY)
Admission: RE | Admit: 2017-10-15 | Discharge: 2017-10-15 | Disposition: A | Discharge status: Home / Self Care | Payer: Medicare Other | Source: Ambulatory Visit | Attending: Internal Medicine | Admitting: Internal Medicine

## 2017-10-15 DIAGNOSIS — J189 Pneumonia, unspecified organism: Secondary | ICD-10-CM | POA: Insufficient documentation

## 2017-10-15 DIAGNOSIS — J849 Interstitial pulmonary disease, unspecified: Secondary | ICD-10-CM | POA: Insufficient documentation

## 2017-10-15 DIAGNOSIS — R0602 Shortness of breath: Secondary | ICD-10-CM | POA: Diagnosis not present

## 2017-11-04 DIAGNOSIS — J449 Chronic obstructive pulmonary disease, unspecified: Secondary | ICD-10-CM | POA: Diagnosis not present

## 2017-11-10 DIAGNOSIS — E1129 Type 2 diabetes mellitus with other diabetic kidney complication: Secondary | ICD-10-CM | POA: Diagnosis not present

## 2017-11-17 DIAGNOSIS — E1129 Type 2 diabetes mellitus with other diabetic kidney complication: Secondary | ICD-10-CM | POA: Diagnosis not present

## 2017-11-17 DIAGNOSIS — J9611 Chronic respiratory failure with hypoxia: Secondary | ICD-10-CM | POA: Diagnosis not present

## 2017-11-27 ENCOUNTER — Ambulatory Visit (HOSPITAL_COMMUNITY): Payer: Medicare Other | Admitting: Adult Health

## 2017-12-05 DIAGNOSIS — J449 Chronic obstructive pulmonary disease, unspecified: Secondary | ICD-10-CM | POA: Diagnosis not present

## 2018-01-04 DIAGNOSIS — J449 Chronic obstructive pulmonary disease, unspecified: Secondary | ICD-10-CM | POA: Diagnosis not present

## 2018-01-15 ENCOUNTER — Ambulatory Visit (HOSPITAL_COMMUNITY): Payer: Medicare Other | Admitting: Internal Medicine

## 2018-01-21 ENCOUNTER — Other Ambulatory Visit: Payer: Self-pay

## 2018-01-21 ENCOUNTER — Inpatient Hospital Stay (HOSPITAL_COMMUNITY): Payer: Medicare Other | Attending: Adult Health | Admitting: Internal Medicine

## 2018-01-21 VITALS — BP 141/100 | HR 62 | Temp 98.2°F | Resp 20 | Ht 62.0 in | Wt 173.0 lb

## 2018-01-21 DIAGNOSIS — I10 Essential (primary) hypertension: Secondary | ICD-10-CM | POA: Insufficient documentation

## 2018-01-21 DIAGNOSIS — C50912 Malignant neoplasm of unspecified site of left female breast: Secondary | ICD-10-CM | POA: Diagnosis not present

## 2018-01-21 DIAGNOSIS — E039 Hypothyroidism, unspecified: Secondary | ICD-10-CM | POA: Insufficient documentation

## 2018-01-21 DIAGNOSIS — M810 Age-related osteoporosis without current pathological fracture: Secondary | ICD-10-CM | POA: Diagnosis not present

## 2018-01-21 NOTE — Progress Notes (Signed)
Diagnosis Infiltrating ductal carcinoma of left breast ([place]) - Plan: MM DIAG BREAST TOMO BILATERAL  Staging Cancer Staging Infiltrating ductal carcinoma of left breast Staging form: Breast, AJCC 7th Edition - Clinical: Stage IIA (T2, N0, cM0) - Signed by Baird Cancer, PA on 03/05/2011 Prognostic indicators: ER 0%  PR 0%  Ki 67 80%  Her 2 2.2, positive    - Pathologic: Stage IIA (T2, N0, cM0) - Signed by Haywood Lasso, MD on 12/24/2011 Prognostic indicators: ER 0%  PR 0%  Ki 67 80%  Her 2 2.2, positive      Assessment and Plan:  1.  Stage II (T2, N0) Invasive ductal carcinoma of the left breast diagnosed on 03/28/2010. She lumpectomy on 04/25/2010, her cancer was 2.3 cm in size with a negative sentinel lymph node. Her cancer was ER/PR negative, but HER-2/neu over amplified. Ki-67 marker was high at 80%, 3 lymph nodes were all negative, however LV I was found. She was treated with carboplatin/Taxol x 4 cycles and Herceptin, for a total of 52 weeks. Her last dose of trastuzumab with May 15, 2011, no evidence of disease. Finished radiation therapy to the L breast with a L breast boost by Dr. Pablo Ledger from 08/16/2010-09/18/2010.  Bilateral diagnostic mammogram done 06/17/2017 that was negative for malignancy.    Pt will be set up for bilateral 3D mammogram in 06/2018 and will follow-up at that time to go over results.  She is advised to notify the office if any problems prior to that visit.  At that time she will be set up for yearly follow-up in October to go over mammogram.    2.  Osteoporosis.  This was noted on Dexa done 05/21/2016.   Pt is on Fosamax and is followed by PCP.  Continue bone density evaluation per PCP recommendations.    3.  HTN.  BP is elevated at 141/100.  Follow-up with PCP.    4.  Hypothyroidism.  Pt is on Synthroid.  Follow-up with PCP for monitoring.    Interval history:  84 y.o. Female followed by Dr. Talbert Cage for Stage II (T2, N0) Invasive ductal  carcinoma of the left breast diagnosed on 03/28/2010. She lumpectomy on 04/25/2010, her cancer was 2.3 cm in size with a negative sentinel lymph node. Her cancer was ER/PR negative, but HER-2/neu over amplified. Ki-67 marker was high at 80%, 3 lymph nodes were all negative, however LV I was found. She was treated with carboplatin/Taxol x 4 cycles and Herceptin, for a total of 52 weeks. Her last dose of trastuzumab with May 15, 2011, no evidence of disease. Finished radiation therapy to the L breast with a L breast boost by Dr. Pablo Ledger from 08/16/2010-09/18/2010.  Pt had bilateral diagnostic mammogram done 06/17/2017 that was negative for malignancy.    Current Status:  Pt is seen today for follow-up.  She denies any changes in the breast.     Problem List Patient Active Problem List   Diagnosis Date Noted  . CAP (community acquired pneumonia) [J18.9] 09/02/2017  . Elevated troponin [R74.8] 09/02/2017  . Anemia [D64.9] 09/02/2017  . Hypomagnesemia [E83.42] 09/02/2017  . Chronic renal disease, stage 2, mildly decreased glomerular filtration rate between 60-89 mL/min/1.73 square meter [N18.2] 10/31/2014  . Port catheter in place [facility] 01/15/2012  . Controlled type 2 diabetes mellitus without complication ([place]) [X51.7] 01/15/2012  . Abnormal presence of protein in urine [R80.9] 01/15/2012  . Adult hypothyroidism [E03.9] 01/15/2012  . Hyperparathyroidism ([place]) [E21.3] 01/15/2012  . HLD (  hyperlipidemia) [E78.5] 01/15/2012  . Gout [M10.9] 01/15/2012  . Essential (primary) hypertension [I10] 01/15/2012  . Chronic obstructive pulmonary disease ([place]) [J44.9] 01/15/2012  . OP (osteoporosis) [M81.0] 01/15/2012  . Infiltrating ductal carcinoma of left breast [C50.919] 03/05/2011  . Overlapping malignant neoplasm of female breast [facility]) [C50.819] 03/08/2010    Past Medical History Past Medical History:  Diagnosis Date  . Breast cancer ([place])   . Chronic renal disease, stage 2, mildly  decreased glomerular filtration rate between 60-89 mL/min/1.73 square meter 10/31/2014  . COPD (chronic obstructive pulmonary disease) ([place])   . Diabetes mellitus   . Glaucoma   . Gout attack june 2013   left foot  . Hyperlipidemia   . Hypertension   . Hypothyroidism   . Infiltrating ductal carcinoma of left breast 03/05/2011  . Osteoporosis   . Port catheter in place 01/15/2012  . SOB (shortness of breath)   . Thyroid disease     Past Surgical History Past Surgical History:  Procedure Laterality Date  . ANKLE SURGERY  1998  . APPENDECTOMY  1955  . BREAST LUMPECTOMY  2011   left  . CATARACT EXTRACTION W/PHACO  09/03/2012   Procedure: CATARACT EXTRACTION PHACO AND INTRAOCULAR LENS PLACEMENT (IOC);  Surgeon: Tonny Branch, MD;  Location: [facility];  Service: Ophthalmology;  Laterality: Right;  CDE: 15.26  . CATARACT EXTRACTION W/PHACO  09/28/2012   Procedure: CATARACT EXTRACTION PHACO AND INTRAOCULAR LENS PLACEMENT (IOC);  Surgeon: Tonny Branch, MD;  Location: [facility];  Service: Ophthalmology;  Laterality: Left;  CDE:  21.60  . CHOLECYSTECTOMY  1990  . PORT-A-CATH REMOVAL N/A 04/26/2015   Procedure: REMOVAL PORT-A-CATH;  Surgeon: Aviva Signs, MD;  Location: [facility];  Service: General;  Laterality: N/A;  . PORTACATH PLACEMENT  05/23/10  . THYROID SURGERY  1994  . TONSILLECTOMY    . TOTAL HIP ARTHROPLASTY  1996    Family History Family History  Problem Relation Age of Onset  . Kidney disease Mother   . Heart disease Father   . Hypertension Brother   . Cancer Sister        breast and lung     Social History  reports that she quit smoking about 26 years ago. Her smoking use included cigarettes. She has a 90.00 pack-year smoking history. She has never used smokeless tobacco. She reports that she does not drink alcohol or use drugs.  Medications  Current Outpatient Medications:  .  alendronate (FOSAMAX) 70 MG tablet, Take 70 mg by mouth every 7 (seven) days. Take with a full glass of water  on an empty stomach. **Takes on Wednesday**, Disp: , Rfl:  .  amLODipine (NORVASC) 5 MG tablet, Take 5 mg by mouth daily. , Disp: , Rfl:  .  aspirin EC 81 MG tablet, Take 81 mg by mouth daily., Disp: , Rfl:  .  Calcium-Magnesium-Vitamin D (CALCIUM 500 PO), Take 1 tablet by mouth daily., Disp: , Rfl:  .  carvedilol (COREG) 25 MG tablet, Take 25 mg by mouth 2 (two) times daily with a meal.  , Disp: , Rfl:  .  Cholecalciferol (VITAMIN D-3) 1000 units CAPS, Take 1 capsule by mouth daily., Disp: , Rfl:  .  DiphenhydrAMINE HCl, Sleep, (NIGHTTIME SLEEP AID) 50 MG CAPS, Take 1 capsule by mouth at bedtime. , Disp: , Rfl:  .  ibuprofen (ADVIL,MOTRIN) 200 MG tablet, Take 400 mg by mouth every 6 (six) hours as needed. For pain, Disp: , Rfl:  .  levothyroxine (SYNTHROID, LEVOTHROID) 125  MCG tablet, Take 125 mcg by mouth daily.  , Disp: , Rfl:  .  losartan (COZAAR) 50 MG tablet, Take 50 mg by mouth daily.  , Disp: , Rfl:  .  SPIRIVA HANDIHALER 18 MCG inhalation capsule, Place 18 mcg into inhaler and inhale daily., Disp: , Rfl:  No current facility-administered medications for this visit.   Facility-Administered Medications Ordered in Other Visits:  .  sodium chloride 0.9 % injection 10 mL, 10 mL, Intravenous, PRN, Farrel Gobble, MD, 10 mL at 05/05/14 1150  Allergies Penicillins; Hctz [hydrochlorothiazide]; Morphine and related; and Lotrel [amlodipine besy-benazepril hcl]  Review of Systems Review of Systems - Oncology ROS as per HPI otherwise 12 point ROS is negative.   Physical Exam  Vitals Wt Readings from Last 3 Encounters:  01/21/18 173 lb (78.5 kg)  09/02/17 175 lb (79.4 kg)  11/28/16 175 lb 12.8 oz (79.7 kg)   Temp Readings from Last 3 Encounters:  01/21/18 98.2 F (36.8 C) (Oral)  09/04/17 98.2 F (36.8 C) (Oral)  11/28/16 97.4 F (36.3 C) (Oral)   BP Readings from Last 3 Encounters:  01/21/18 (!) 141/100  09/04/17 132/66  11/28/16 130/73   Pulse Readings from Last 3  Encounters:  01/21/18 62  09/04/17 62  11/28/16 71   Constitutional: Well-developed, well-nourished, and in no distress.   HENT: Head: Normocephalic and atraumatic.  Mouth/Throat: No oropharyngeal exudate. Mucosa moist. Eyes: Pupils are equal, round, and reactive to light. Conjunctivae are normal. No scleral icterus.  Neck: Normal range of motion. Neck supple. No JVD present.  Cardiovascular: Normal rate, regular rhythm and normal heart sounds.  Exam reveals no gallop and no friction rub.   No murmur heard. Pulmonary/Chest: Effort normal and breath sounds normal. No respiratory distress. No wheezes.No rales.  Abdominal: Soft. Bowel sounds are normal. No distension. There is no tenderness. There is no guarding.  Musculoskeletal: No edema or tenderness.  Lymphadenopathy: No cervical, axillary or supraclavicular adenopathy.  Neurological: Alert and oriented to person, place, and time. No cranial nerve deficit.  Skin: Skin is warm and dry. No rash noted. No erythema. No pallor.  Psychiatric: Affect and judgment normal.  Bilateral breast exam:  Chaperone present.  Lumpectomy and RT changes noted on left breast.  Right breast shows no dominant palpable masses.    Labs No visits with results within 3 Day(s) from this visit.  Latest known visit with results is:  Admission on 09/02/2017, Discharged on 09/04/2017  Component Date Value Ref Range Status  . WBC 09/02/2017 16.3* 4.0 - 10.5 K/uL Final  . RBC 09/02/2017 3.24* 3.87 - 5.11 MIL/uL Final  . Hemoglobin 09/02/2017 9.3* 12.0 - 15.0 g/dL Final  . HCT 09/02/2017 29.0* 36.0 - 46.0 % Final  . MCV 09/02/2017 89.5  78.0 - 100.0 fL Final  . MCH 09/02/2017 28.7  26.0 - 34.0 pg Final  . MCHC 09/02/2017 32.1  30.0 - 36.0 g/dL Final  . RDW 09/02/2017 14.0  11.5 - 15.5 % Final  . Platelets 09/02/2017 188  150 - 400 K/uL Final  . Neutrophils Relative % 09/02/2017 90  % Final  . Lymphocytes Relative 09/02/2017 6  % Final  . Monocytes Relative  09/02/2017 4  % Final  . Eosinophils Relative 09/02/2017 0  % Final  . Basophils Relative 09/02/2017 0  % Final  . Neutro Abs 09/02/2017 14.6* 1.7 - 7.7 K/uL Final  . Lymphs Abs 09/02/2017 1.0  0.7 - 4.0 K/uL Final  . Monocytes Absolute 09/02/2017 0.7  0.1 - 1.0 K/uL Final  . Eosinophils Absolute 09/02/2017 0.0  0.0 - 0.7 K/uL Final  . Basophils Absolute 09/02/2017 0.0  0.0 - 0.1 K/uL Final  . WBC Morphology 09/02/2017 INCREASED BANDS (>20% BANDS)   Final  . Sodium 09/02/2017 137  135 - 145 mmol/L Final  . Potassium 09/02/2017 3.6  3.5 - 5.1 mmol/L Final  . Chloride 09/02/2017 96* 101 - 111 mmol/L Final  . CO2 09/02/2017 27  22 - 32 mmol/L Final  . Glucose, Bld 09/02/2017 120* 65 - 99 mg/dL Final  . BUN 09/02/2017 33* 6 - 20 mg/dL Final  . Creatinine, Ser 09/02/2017 1.23* 0.44 - 1.00 mg/dL Final  . Calcium 09/02/2017 9.7  8.9 - 10.3 mg/dL Final  . Total Protein 09/02/2017 7.1  6.5 - 8.1 g/dL Final  . Albumin 09/02/2017 3.2* 3.5 - 5.0 g/dL Final  . AST 09/02/2017 43* 15 - 41 U/L Final  . ALT 09/02/2017 21  14 - 54 U/L Final  . Alkaline Phosphatase 09/02/2017 54  38 - 126 U/L Final  . Total Bilirubin 09/02/2017 0.7  0.3 - 1.2 mg/dL Final  . GFR calc non Af Amer 09/02/2017 39* >60 mL/min Final  . GFR calc Af Amer 09/02/2017 46* >60 mL/min Final   Comment: (NOTE) The eGFR has been calculated using the CKD EPI equation. This calculation has not been validated in all clinical situations. eGFR's persistently <60 mL/min signify possible Chronic Kidney Disease.   . Anion gap 09/02/2017 14  5 - 15 Final  . Troponin I 09/02/2017 0.11* <0.03 ng/mL Final   Comment: CRITICAL RESULT CALLED TO, READ BACK BY AND VERIFIED WITH: MYRICK,B ON 09/02/17 AT 1840 BY LOY,C   . B Natriuretic Peptide 09/02/2017 398.0* 0.0 - 100.0 pg/mL Final  . Magnesium 09/02/2017 1.2* 1.7 - 2.4 mg/dL Final  . Phosphorus 09/02/2017 3.2  2.5 - 4.6 mg/dL Final  . Adenovirus 09/03/2017 NOT DETECTED  NOT DETECTED Final  .  Coronavirus 229E 09/03/2017 NOT DETECTED  NOT DETECTED Final  . Coronavirus HKU1 09/03/2017 NOT DETECTED  NOT DETECTED Final  . Coronavirus NL63 09/03/2017 NOT DETECTED  NOT DETECTED Final  . Coronavirus OC43 09/03/2017 NOT DETECTED  NOT DETECTED Final  . Metapneumovirus 09/03/2017 NOT DETECTED  NOT DETECTED Final  . Rhinovirus / Enterovirus 09/03/2017 NOT DETECTED  NOT DETECTED Final  . Influenza A 09/03/2017 NOT DETECTED  NOT DETECTED Final  . Influenza B 09/03/2017 NOT DETECTED  NOT DETECTED Final  . Parainfluenza Virus 1 09/03/2017 NOT DETECTED  NOT DETECTED Final  . Parainfluenza Virus 2 09/03/2017 NOT DETECTED  NOT DETECTED Final  . Parainfluenza Virus 3 09/03/2017 NOT DETECTED  NOT DETECTED Final  . Parainfluenza Virus 4 09/03/2017 NOT DETECTED  NOT DETECTED Final  . Respiratory Syncytial Virus 09/03/2017 NOT DETECTED  NOT DETECTED Final  . Bordetella pertussis 09/03/2017 NOT DETECTED  NOT DETECTED Final  . Chlamydophila pneumoniae 09/03/2017 NOT DETECTED  NOT DETECTED Final  . Mycoplasma pneumoniae 09/03/2017 NOT DETECTED  NOT DETECTED Final   Performed at [facility], [place] [address], [place],  [address]  . Sodium 09/03/2017 133* 135 - 145 mmol/L Final  . Potassium 09/03/2017 3.8  3.5 - 5.1 mmol/L Final  . Chloride 09/03/2017 94* 101 - 111 mmol/L Final  . CO2 09/03/2017 30  22 - 32 mmol/L Final  . Glucose, Bld 09/03/2017 158* 65 - 99 mg/dL Final  . BUN 09/03/2017 39* 6 - 20 mg/dL Final  . Creatinine, Ser 09/03/2017  1.32* 0.44 - 1.00 mg/dL Final  . Calcium 09/03/2017 9.3  8.9 - 10.3 mg/dL Final  . GFR calc non Af Amer 09/03/2017 36* >60 mL/min Final  . GFR calc Af Amer 09/03/2017 42* >60 mL/min Final   Comment: (NOTE) The eGFR has been calculated using the CKD EPI equation. This calculation has not been validated in all clinical situations. eGFR's persistently <60 mL/min signify possible Chronic Kidney Disease.   . Anion gap 09/03/2017 9  5 - 15 Final  . WBC  09/03/2017 19.8* 4.0 - 10.5 K/uL Final   WHITE COUNT CONFIRMED ON SMEAR  . RBC 09/03/2017 3.25* 3.87 - 5.11 MIL/uL Final  . Hemoglobin 09/03/2017 9.2* 12.0 - 15.0 g/dL Final  . HCT 09/03/2017 28.9* 36.0 - 46.0 % Final  . MCV 09/03/2017 88.9  78.0 - 100.0 fL Final  . MCH 09/03/2017 28.3  26.0 - 34.0 pg Final  . MCHC 09/03/2017 31.8  30.0 - 36.0 g/dL Final  . RDW 09/03/2017 14.0  11.5 - 15.5 % Final  . Platelets 09/03/2017 186  150 - 400 K/uL Final  . Neutrophils Relative % 09/03/2017 92  % Final  . Lymphocytes Relative 09/03/2017 7  % Final  . Monocytes Relative 09/03/2017 1  % Final  . Eosinophils Relative 09/03/2017 0  % Final  . Basophils Relative 09/03/2017 0  % Final  . Neutro Abs 09/03/2017 18.2* 1.7 - 7.7 K/uL Final  . Lymphs Abs 09/03/2017 1.4  0.7 - 4.0 K/uL Final  . Monocytes Absolute 09/03/2017 0.2  0.1 - 1.0 K/uL Final  . Eosinophils Absolute 09/03/2017 0.0  0.0 - 0.7 K/uL Final  . Basophils Absolute 09/03/2017 0.0  0.0 - 0.1 K/uL Final  . WBC Morphology 09/03/2017 MILD LEFT SHIFT (1-5% METAS, OCC MYELO, OCC BANDS)   Final  . Troponin I 09/02/2017 0.08* <0.03 ng/mL Final   CRITICAL VALUE NOTED.  VALUE IS CONSISTENT WITH PREVIOUSLY REPORTED AND CALLED VALUE.  . Troponin I 09/03/2017 0.05* <0.03 ng/mL Final   CRITICAL VALUE NOTED.  VALUE IS CONSISTENT WITH PREVIOUSLY REPORTED AND CALLED VALUE.  . Troponin I 09/03/2017 0.05* <0.03 ng/mL Final   CRITICAL VALUE NOTED.  VALUE IS CONSISTENT WITH PREVIOUSLY REPORTED AND CALLED VALUE.  Marland Kitchen Weight 09/03/2017 2,800  oz Final  . Height 09/03/2017 62  in Final  . BP 09/03/2017 112/59  mmHg Final  . Glucose-Capillary 09/02/2017 121* 65 - 99 mg/dL Final  . Comment 1 09/02/2017 Notify RN   Final  . Comment 2 09/02/2017 Document in Chart   Final  . Strep Pneumo Urinary Antigen 09/03/2017 NEGATIVE  NEGATIVE Final   Comment:        Infection due to S. pneumoniae cannot be absolutely ruled out since the antigen present may be below the  detection limit of the test.   . Glucose-Capillary 09/03/2017 158* 65 - 99 mg/dL Final  . Comment 1 09/03/2017 Notify RN   Final  . Comment 2 09/03/2017 Document in Chart   Final  . Glucose-Capillary 09/03/2017 171* 65 - 99 mg/dL Final  . Comment 1 09/03/2017 Notify RN   Final  . Comment 2 09/03/2017 Document in Chart   Final  . Glucose-Capillary 09/03/2017 181* 65 - 99 mg/dL Final  . Comment 1 09/03/2017 Notify RN   Final  . Comment 2 09/03/2017 Document in Chart   Final  . Sodium 09/04/2017 137  135 - 145 mmol/L Final  . Potassium 09/04/2017 3.4* 3.5 - 5.1 mmol/L Final  . Chloride  09/04/2017 96* 101 - 111 mmol/L Final  . CO2 09/04/2017 28  22 - 32 mmol/L Final  . Glucose, Bld 09/04/2017 175* 65 - 99 mg/dL Final  . BUN 09/04/2017 66* 6 - 20 mg/dL Final  . Creatinine, Ser 09/04/2017 1.44* 0.44 - 1.00 mg/dL Final  . Calcium 09/04/2017 9.4  8.9 - 10.3 mg/dL Final  . GFR calc non Af Amer 09/04/2017 33* >60 mL/min Final  . GFR calc Af Amer 09/04/2017 38* >60 mL/min Final   Comment: (NOTE) The eGFR has been calculated using the CKD EPI equation. This calculation has not been validated in all clinical situations. eGFR's persistently <60 mL/min signify possible Chronic Kidney Disease.   . Anion gap 09/04/2017 13  5 - 15 Final  . WBC 09/04/2017 18.5* 4.0 - 10.5 K/uL Final  . RBC 09/04/2017 3.57* 3.87 - 5.11 MIL/uL Final  . Hemoglobin 09/04/2017 10.2* 12.0 - 15.0 g/dL Final  . HCT 09/04/2017 31.7* 36.0 - 46.0 % Final  . MCV 09/04/2017 88.8  78.0 - 100.0 fL Final  . MCH 09/04/2017 28.6  26.0 - 34.0 pg Final  . MCHC 09/04/2017 32.2  30.0 - 36.0 g/dL Final  . RDW 09/04/2017 13.9  11.5 - 15.5 % Final  . Platelets 09/04/2017 244  150 - 400 K/uL Final  . Neutrophils Relative % 09/04/2017 89  % Final  . Neutro Abs 09/04/2017 16.5* 1.7 - 7.7 K/uL Final  . Lymphocytes Relative 09/04/2017 6  % Final  . Lymphs Abs 09/04/2017 1.2  0.7 - 4.0 K/uL Final  . Monocytes Relative 09/04/2017 5  % Final   . Monocytes Absolute 09/04/2017 0.8  0.1 - 1.0 K/uL Final  . Eosinophils Relative 09/04/2017 0  % Final  . Eosinophils Absolute 09/04/2017 0.0  0.0 - 0.7 K/uL Final  . Basophils Relative 09/04/2017 0  % Final  . Basophils Absolute 09/04/2017 0.0  0.0 - 0.1 K/uL Final  . Glucose-Capillary 09/03/2017 164* 65 - 99 mg/dL Final  . Glucose-Capillary 09/04/2017 158* 65 - 99 mg/dL Final  . Color, Urine 09/04/2017 YELLOW  YELLOW Final  . APPearance 09/04/2017 CLEAR  CLEAR Final  . Specific Gravity, Urine 09/04/2017 1.012  1.005 - 1.030 Final  . pH 09/04/2017 5.0  5.0 - 8.0 Final  . Glucose, UA 09/04/2017 NEGATIVE  NEGATIVE mg/dL Final  . Hgb urine dipstick 09/04/2017 NEGATIVE  NEGATIVE Final  . Bilirubin Urine 09/04/2017 NEGATIVE  NEGATIVE Final  . Ketones, ur 09/04/2017 NEGATIVE  NEGATIVE mg/dL Final  . Protein, ur 09/04/2017 NEGATIVE  NEGATIVE mg/dL Final  . Nitrite 09/04/2017 NEGATIVE  NEGATIVE Final  . Leukocytes, UA 09/04/2017 TRACE* NEGATIVE Final  . RBC / HPF 09/04/2017 NONE SEEN  0 - 5 RBC/hpf Final  . WBC, UA 09/04/2017 0-5  0 - 5 WBC/hpf Final  . Bacteria, UA 09/04/2017 NONE SEEN  NONE SEEN Final  . Squamous Epithelial / LPF 09/04/2017 0-5* NONE SEEN Final  . Hyaline Casts, UA 09/04/2017 PRESENT   Final  . Glucose-Capillary 09/04/2017 134* 65 - 99 mg/dL Final     Pathology Orders Placed This Encounter  Procedures  . MM DIAG BREAST TOMO BILATERAL    Standing Status:   Future    Standing Expiration Date:   01/22/2019    Order Specific Question:   Reason for Exam (SYMPTOM  OR DIAGNOSIS REQUIRED)    Answer:   left breast cancer    Order Specific Question:   Preferred imaging location?    Answer:   Deneise Lever  [facility]       Zoila Shutter MD

## 2018-01-21 NOTE — Patient Instructions (Signed)
[facility] at [facility]  Discharge Instructions:   Today you saw Dr. Higgs.    _______________________________________________________________  Thank you for choosing [facility] at [facility] to provide your oncology and hematology care.  To afford each patient quality time with our providers, please arrive at least 15 minutes before your scheduled appointment.  You need to re-schedule your appointment if you arrive 10 or more minutes late.  We strive to give you quality time with our providers, and arriving late affects you and other patients whose appointments are after yours.  Also, if you no show three or more times for appointments you may be dismissed from the clinic.  Again, thank you for choosing [facility] at [facility]. Our hope is that these requests will allow you access to exceptional care and in a timely manner. _______________________________________________________________  If you have questions after your visit, please contact our office at [phone] between the hours of 8:30 a.m. and 5:00 p.m. Voicemails left after 4:30 p.m. will not be returned until the following business day. _______________________________________________________________  For prescription refill requests, have your pharmacy contact our office. _______________________________________________________________  Recommendations made by the consultant and any test results will be sent to your referring physician. _______________________________________________________________ 

## 2018-01-23 ENCOUNTER — Ambulatory Visit (HOSPITAL_COMMUNITY): Payer: Medicare Other | Admitting: Internal Medicine

## 2018-02-04 DIAGNOSIS — J449 Chronic obstructive pulmonary disease, unspecified: Secondary | ICD-10-CM | POA: Diagnosis not present

## 2018-03-06 DIAGNOSIS — J449 Chronic obstructive pulmonary disease, unspecified: Secondary | ICD-10-CM | POA: Diagnosis not present

## 2018-04-06 DIAGNOSIS — J449 Chronic obstructive pulmonary disease, unspecified: Secondary | ICD-10-CM | POA: Diagnosis not present

## 2018-05-07 DIAGNOSIS — J449 Chronic obstructive pulmonary disease, unspecified: Secondary | ICD-10-CM | POA: Diagnosis not present

## 2018-05-20 DIAGNOSIS — I10 Essential (primary) hypertension: Secondary | ICD-10-CM | POA: Diagnosis not present

## 2018-05-20 DIAGNOSIS — J9611 Chronic respiratory failure with hypoxia: Secondary | ICD-10-CM | POA: Diagnosis not present

## 2018-05-20 DIAGNOSIS — Z79899 Other long term (current) drug therapy: Secondary | ICD-10-CM | POA: Diagnosis not present

## 2018-05-20 DIAGNOSIS — E1129 Type 2 diabetes mellitus with other diabetic kidney complication: Secondary | ICD-10-CM | POA: Diagnosis not present

## 2018-05-20 DIAGNOSIS — E039 Hypothyroidism, unspecified: Secondary | ICD-10-CM | POA: Diagnosis not present

## 2018-05-26 DIAGNOSIS — J9611 Chronic respiratory failure with hypoxia: Secondary | ICD-10-CM | POA: Diagnosis not present

## 2018-05-26 DIAGNOSIS — E1129 Type 2 diabetes mellitus with other diabetic kidney complication: Secondary | ICD-10-CM | POA: Diagnosis not present

## 2018-05-26 DIAGNOSIS — Z23 Encounter for immunization: Secondary | ICD-10-CM | POA: Diagnosis not present

## 2018-05-26 DIAGNOSIS — I10 Essential (primary) hypertension: Secondary | ICD-10-CM | POA: Diagnosis not present

## 2018-05-26 DIAGNOSIS — Z0001 Encounter for general adult medical examination with abnormal findings: Secondary | ICD-10-CM | POA: Diagnosis not present

## 2018-05-27 DIAGNOSIS — E1129 Type 2 diabetes mellitus with other diabetic kidney complication: Secondary | ICD-10-CM | POA: Diagnosis not present

## 2018-05-27 DIAGNOSIS — Z79899 Other long term (current) drug therapy: Secondary | ICD-10-CM | POA: Diagnosis not present

## 2018-05-27 DIAGNOSIS — E785 Hyperlipidemia, unspecified: Secondary | ICD-10-CM | POA: Diagnosis not present

## 2018-05-27 DIAGNOSIS — I10 Essential (primary) hypertension: Secondary | ICD-10-CM | POA: Diagnosis not present

## 2018-05-27 DIAGNOSIS — J9611 Chronic respiratory failure with hypoxia: Secondary | ICD-10-CM | POA: Diagnosis not present

## 2018-05-28 ENCOUNTER — Other Ambulatory Visit (HOSPITAL_COMMUNITY): Payer: Self-pay | Admitting: Internal Medicine

## 2018-05-28 DIAGNOSIS — Z780 Asymptomatic menopausal state: Secondary | ICD-10-CM

## 2018-06-06 DIAGNOSIS — J449 Chronic obstructive pulmonary disease, unspecified: Secondary | ICD-10-CM | POA: Diagnosis not present

## 2018-06-22 ENCOUNTER — Ambulatory Visit (HOSPITAL_COMMUNITY)
Admission: RE | Admit: 2018-06-22 | Discharge: 2018-06-22 | Disposition: A | Discharge status: Home / Self Care | Payer: Medicare Other | Source: Ambulatory Visit | Attending: Internal Medicine | Admitting: Internal Medicine

## 2018-06-22 DIAGNOSIS — Z8739 Personal history of other diseases of the musculoskeletal system and connective tissue: Secondary | ICD-10-CM | POA: Insufficient documentation

## 2018-06-22 DIAGNOSIS — Z1231 Encounter for screening mammogram for malignant neoplasm of breast: Secondary | ICD-10-CM | POA: Diagnosis not present

## 2018-06-22 DIAGNOSIS — Z13820 Encounter for screening for osteoporosis: Secondary | ICD-10-CM | POA: Insufficient documentation

## 2018-06-22 DIAGNOSIS — M810 Age-related osteoporosis without current pathological fracture: Secondary | ICD-10-CM | POA: Insufficient documentation

## 2018-06-22 DIAGNOSIS — Z7983 Long term (current) use of bisphosphonates: Secondary | ICD-10-CM | POA: Diagnosis not present

## 2018-06-22 DIAGNOSIS — Z780 Asymptomatic menopausal state: Secondary | ICD-10-CM

## 2018-06-22 DIAGNOSIS — C50912 Malignant neoplasm of unspecified site of left female breast: Secondary | ICD-10-CM

## 2018-06-22 DIAGNOSIS — Z853 Personal history of malignant neoplasm of breast: Secondary | ICD-10-CM | POA: Diagnosis not present

## 2018-06-26 ENCOUNTER — Inpatient Hospital Stay (HOSPITAL_COMMUNITY): Payer: Medicare Other | Attending: Internal Medicine | Admitting: Internal Medicine

## 2018-06-26 ENCOUNTER — Encounter (HOSPITAL_COMMUNITY): Payer: Self-pay | Admitting: Internal Medicine

## 2018-06-26 ENCOUNTER — Other Ambulatory Visit: Payer: Self-pay

## 2018-06-26 VITALS — BP 154/70 | HR 56 | Temp 97.6°F | Resp 20 | Wt 178.0 lb

## 2018-06-26 DIAGNOSIS — Z853 Personal history of malignant neoplasm of breast: Secondary | ICD-10-CM | POA: Diagnosis not present

## 2018-06-26 DIAGNOSIS — E039 Hypothyroidism, unspecified: Secondary | ICD-10-CM | POA: Insufficient documentation

## 2018-06-26 DIAGNOSIS — J449 Chronic obstructive pulmonary disease, unspecified: Secondary | ICD-10-CM | POA: Diagnosis not present

## 2018-06-26 DIAGNOSIS — I10 Essential (primary) hypertension: Secondary | ICD-10-CM | POA: Diagnosis not present

## 2018-06-26 DIAGNOSIS — M810 Age-related osteoporosis without current pathological fracture: Secondary | ICD-10-CM | POA: Insufficient documentation

## 2018-06-26 DIAGNOSIS — Z9981 Dependence on supplemental oxygen: Secondary | ICD-10-CM | POA: Diagnosis not present

## 2018-06-26 DIAGNOSIS — C50912 Malignant neoplasm of unspecified site of left female breast: Secondary | ICD-10-CM

## 2018-06-26 NOTE — Patient Instructions (Addendum)
[place] at [facility] Discharge Instructions  Today you saw Dr. Walden Field  Mammogram reviewed  You will go to once a year mammograms - next one due October 2020. Return to clinic in October 2020 after mammogram!    Thank you for choosing [place] at [facility] to provide your oncology and hematology care.  To afford each patient quality time with our provider, please arrive at least 15 minutes before your scheduled appointment time.   If you have a lab appointment with the [facility] please come in thru the  Main Entrance and check in at the main information desk  You need to re-schedule your appointment should you arrive 10 or more minutes late.  We strive to give you quality time with our providers, and arriving late affects you and other patients whose appointments are after yours.  Also, if you no show three or more times for appointments you may be dismissed from the clinic at the providers discretion.     Again, thank you for choosing [facility].  Our hope is that these requests will decrease the amount of time that you wait before being seen by our physicians.       _____________________________________________________________  Should you have questions after your visit to [facility], please contact our office at [phone] between the hours of 8:00 a.m. and 4:30 p.m.  Voicemails left after 4:00 p.m. will not be returned until the following business day.  For prescription refill requests, have your pharmacy contact our office and allow 72 hours.    Cancer Center Support Programs:   > Cancer Support Group  2nd Tuesday of the month 1pm-2pm, Journey Room

## 2018-06-26 NOTE — Progress Notes (Signed)
Diagnosis Infiltrating ductal carcinoma of left breast ([place]) - Plan: MM Digital Screening  Staging Cancer Staging Infiltrating ductal carcinoma of left breast Staging form: Breast, AJCC 7th Edition - Clinical: Stage IIA (T2, N0, cM0) - Signed by Baird Cancer, PA on 03/05/2011 Prognostic indicators: ER 0%  PR 0%  Ki 67 80%  Her 2 2.2, positive    - Pathologic: Stage IIA (T2, N0, cM0) - Signed by Haywood Lasso, MD on 12/24/2011 Prognostic indicators: ER 0%  PR 0%  Ki 67 80%  Her 2 2.2, positive    Assessment and Plan:  1.  Stage II (T2, N0) Invasive ductal carcinoma of the left breast diagnosed on 03/28/2010. She lumpectomy on 04/25/2010, her cancer was 2.3 cm in size with a negative sentinel lymph node. Her cancer was ER/PR negative, but HER-2/neu over amplified. Ki-67 marker was high at 80%, 3 lymph nodes were all negative, however LVI was found. She was treated with carboplatin/Taxol x 4 cycles and Herceptin, for a total of 52 weeks. Her last dose of trastuzumab with May 15, 2011, no evidence of disease. Finished radiation therapy to the L breast with a L breast boost by Dr. Pablo Ledger from 08/16/2010-09/18/2010.  Bilateral diagnostic mammogram done 06/17/2017 that was negative for malignancy.    Pt had bilateral 3D mammogram done 06/22/2018 that was negative. She is now 8 years out from diagnosis and 7 years out from completion of RT and Herceptin.  It is reasonable for her to be seen yearly with mammogram.  She should notify the office if she has any problems prior to her follow-up in 06/2019.      2.  Osteoporosis.  This was noted on Dexa done 05/21/2016.   Pt is on Fosamax and is followed by PCP.  Continue bone density evaluation per PCP recommendations.    3.  HTN.  BP is elevated at 154/70.  Follow-up with PCP.    4.  Hypothyroidism.  Pt is on Synthroid.  Follow-up with PCP for monitoring.    5.  COPD.  Pt on Oxygen and is followed by Dr. [name].    Interval  history:  Historical data obtained from note dated 01/21/2018:  84 y.o. Female followed by Dr. Talbert Cage for Stage II (T2, N0) Invasive ductal carcinoma of the left breast diagnosed on 03/28/2010. She lumpectomy on 04/25/2010, her cancer was 2.3 cm in size with a negative sentinel lymph node. Her cancer was ER/PR negative, but HER-2/neu over amplified. Ki-67 marker was high at 80%, 3 lymph nodes were all negative, however LV I was found. She was treated with carboplatin/Taxol x 4 cycles and Herceptin, for a total of 52 weeks. Her last dose of trastuzumab with May 15, 2011, no evidence of disease. Finished radiation therapy to the L breast with a L breast boost by Dr. Pablo Ledger from 08/16/2010-09/18/2010.  Pt had bilateral diagnostic mammogram done 06/17/2017 that was negative for malignancy.    Current Status:  Pt is seen today for follow-up.  She is here to go over mammogram.  She denies any changes in breasts.    Problem List Patient Active Problem List   Diagnosis Date Noted  . CAP (community acquired pneumonia) [J18.9] 09/02/2017  . Elevated troponin [R79.89] 09/02/2017  . Anemia [D64.9] 09/02/2017  . Hypomagnesemia [E83.42] 09/02/2017  . Chronic renal disease, stage 2, mildly decreased glomerular filtration rate between 60-89 mL/min/1.73 square meter [N18.2] 10/31/2014  . Port catheter in place [facility] 01/15/2012  . Controlled type 2 diabetes mellitus  without complication ([place]) [Q03.4] 01/15/2012  . Abnormal presence of protein in urine [R80.9] 01/15/2012  . Adult hypothyroidism [E03.9] 01/15/2012  . Hyperparathyroidism ([place]) [E21.3] 01/15/2012  . HLD (hyperlipidemia) [E78.5] 01/15/2012  . Gout [M10.9] 01/15/2012  . Essential (primary) hypertension [I10] 01/15/2012  . Chronic obstructive pulmonary disease ([place]) [J44.9] 01/15/2012  . OP (osteoporosis) [M81.0] 01/15/2012  . Infiltrating ductal carcinoma of left breast [C50.919] 03/05/2011  . Overlapping malignant neoplasm of female breast  [facility]) [C50.819] 03/08/2010    Past Medical History Past Medical History:  Diagnosis Date  . Breast cancer ([place])   . Chronic renal disease, stage 2, mildly decreased glomerular filtration rate between 60-89 mL/min/1.73 square meter 10/31/2014  . COPD (chronic obstructive pulmonary disease) ([place])   . Diabetes mellitus   . Glaucoma   . Gout attack june 2013   left foot  . Hyperlipidemia   . Hypertension   . Hypothyroidism   . Infiltrating ductal carcinoma of left breast 03/05/2011  . Osteoporosis   . Port catheter in place 01/15/2012  . SOB (shortness of breath)   . Thyroid disease     Past Surgical History Past Surgical History:  Procedure Laterality Date  . ANKLE SURGERY  1998  . APPENDECTOMY  1955  . BREAST LUMPECTOMY  2011   left  . CATARACT EXTRACTION W/PHACO  09/03/2012   Procedure: CATARACT EXTRACTION PHACO AND INTRAOCULAR LENS PLACEMENT (IOC);  Surgeon: Tonny Branch, MD;  Location: [facility];  Service: Ophthalmology;  Laterality: Right;  CDE: 15.26  . CATARACT EXTRACTION W/PHACO  09/28/2012   Procedure: CATARACT EXTRACTION PHACO AND INTRAOCULAR LENS PLACEMENT (IOC);  Surgeon: Tonny Branch, MD;  Location: [facility];  Service: Ophthalmology;  Laterality: Left;  CDE:  21.60  . CHOLECYSTECTOMY  1990  . PORT-A-CATH REMOVAL N/A 04/26/2015   Procedure: REMOVAL PORT-A-CATH;  Surgeon: Aviva Signs, MD;  Location: [facility];  Service: General;  Laterality: N/A;  . PORTACATH PLACEMENT  05/23/10  . THYROID SURGERY  1994  . TONSILLECTOMY    . TOTAL HIP ARTHROPLASTY  1996    Family History Family History  Problem Relation Age of Onset  . Kidney disease Mother   . Heart disease Father   . Hypertension Brother   . Cancer Sister        breast and lung     Social History  reports that she quit smoking about 26 years ago. Her smoking use included cigarettes. She has a 90.00 pack-year smoking history. She has never used smokeless tobacco. She reports that she does not drink alcohol or use  drugs.  Medications  Current Outpatient Medications:  .  alendronate (FOSAMAX) 70 MG tablet, Take 70 mg by mouth every 7 (seven) days. Take with a full glass of water on an empty stomach. **Takes on Wednesday**, Disp: , Rfl:  .  amLODipine (NORVASC) 5 MG tablet, Take 5 mg by mouth daily. , Disp: , Rfl:  .  aspirin EC 81 MG tablet, Take 81 mg by mouth daily., Disp: , Rfl:  .  Calcium-Magnesium-Vitamin D (CALCIUM 500 PO), Take 1 tablet by mouth daily., Disp: , Rfl:  .  carvedilol (COREG) 25 MG tablet, Take 25 mg by mouth 2 (two) times daily with a meal.  , Disp: , Rfl:  .  Cholecalciferol (VITAMIN D-3) 1000 units CAPS, Take 1 capsule by mouth daily., Disp: , Rfl:  .  DiphenhydrAMINE HCl, Sleep, (NIGHTTIME SLEEP AID) 50 MG CAPS, Take 1 capsule by mouth at bedtime. , Disp: , Rfl:  .  ibuprofen (ADVIL,MOTRIN) 200 MG tablet, Take 400 mg by mouth every 6 (six) hours as needed. For pain, Disp: , Rfl:  .  levothyroxine (SYNTHROID, LEVOTHROID) 125 MCG tablet, Take 125 mcg by mouth daily.  , Disp: , Rfl:  .  losartan (COZAAR) 50 MG tablet, Take 50 mg by mouth daily.  , Disp: , Rfl:  .  SPIRIVA HANDIHALER 18 MCG inhalation capsule, Place 18 mcg into inhaler and inhale daily., Disp: , Rfl:  No current facility-administered medications for this visit.   Facility-Administered Medications Ordered in Other Visits:  .  sodium chloride 0.9 % injection 10 mL, 10 mL, Intravenous, PRN, Farrel Gobble, MD, 10 mL at 05/05/14 1150  Allergies Penicillins; Hctz [hydrochlorothiazide]; Morphine and related; and Lotrel [amlodipine besy-benazepril hcl]  Review of Systems Review of Systems - Oncology ROS negative   Physical Exam  Vitals Wt Readings from Last 3 Encounters:  06/26/18 178 lb (80.7 kg)  01/21/18 173 lb (78.5 kg)  09/02/17 175 lb (79.4 kg)   Temp Readings from Last 3 Encounters:  06/26/18 97.6 F (36.4 C) (Oral)  01/21/18 98.2 F (36.8 C) (Oral)  09/04/17 98.2 F (36.8 C) (Oral)   BP  Readings from Last 3 Encounters:  06/26/18 (!) 154/70  01/21/18 (!) 141/100  09/04/17 132/66   Pulse Readings from Last 3 Encounters:  06/26/18 (!) 56  01/21/18 62  09/04/17 62   Constitutional: Well-developed, well-nourished, and in no distress.   HENT: Head: Normocephalic and atraumatic.  Mouth/Throat: No oropharyngeal exudate. Mucosa moist. Eyes: Pupils are equal, round, and reactive to light. Conjunctivae are normal. No scleral icterus.  Neck: Normal range of motion. Neck supple. No JVD present.  Cardiovascular: Normal rate, regular rhythm and normal heart sounds.  Exam reveals no gallop and no friction rub.   No murmur heard. Pulmonary/Chest: Effort normal and breath sounds normal. No respiratory distress. No wheezes.No rales.  Abdominal: Soft. Bowel sounds are normal. No distension. There is no tenderness. There is no guarding.  Musculoskeletal: No edema or tenderness.  Lymphadenopathy: No cervical, axillary or supraclavicular adenopathy.  Neurological: Alert and oriented to person, place, and time. No cranial nerve deficit.  Skin: Skin is warm and dry. No rash noted. No erythema. No pallor.  Psychiatric: Affect and judgment normal.  Breast exam:  Chaperone present.  Lumpectomy and RT changes noted left breast.  No dominant masses palpable bilaterally.    Labs No visits with results within 3 Day(s) from this visit.  Latest known visit with results is:  Admission on 09/02/2017, Discharged on 09/04/2017  Component Date Value Ref Range Status  . WBC 09/02/2017 16.3* 4.0 - 10.5 K/uL Final  . RBC 09/02/2017 3.24* 3.87 - 5.11 MIL/uL Final  . Hemoglobin 09/02/2017 9.3* 12.0 - 15.0 g/dL Final  . HCT 09/02/2017 29.0* 36.0 - 46.0 % Final  . MCV 09/02/2017 89.5  78.0 - 100.0 fL Final  . MCH 09/02/2017 28.7  26.0 - 34.0 pg Final  . MCHC 09/02/2017 32.1  30.0 - 36.0 g/dL Final  . RDW 09/02/2017 14.0  11.5 - 15.5 % Final  . Platelets 09/02/2017 188  150 - 400 K/uL Final  . Neutrophils  Relative % 09/02/2017 90  % Final  . Lymphocytes Relative 09/02/2017 6  % Final  . Monocytes Relative 09/02/2017 4  % Final  . Eosinophils Relative 09/02/2017 0  % Final  . Basophils Relative 09/02/2017 0  % Final  . Neutro Abs 09/02/2017 14.6* 1.7 - 7.7 K/uL Final  .  Lymphs Abs 09/02/2017 1.0  0.7 - 4.0 K/uL Final  . Monocytes Absolute 09/02/2017 0.7  0.1 - 1.0 K/uL Final  . Eosinophils Absolute 09/02/2017 0.0  0.0 - 0.7 K/uL Final  . Basophils Absolute 09/02/2017 0.0  0.0 - 0.1 K/uL Final  . WBC Morphology 09/02/2017 INCREASED BANDS (>20% BANDS)   Final  . Sodium 09/02/2017 137  135 - 145 mmol/L Final  . Potassium 09/02/2017 3.6  3.5 - 5.1 mmol/L Final  . Chloride 09/02/2017 96* 101 - 111 mmol/L Final  . CO2 09/02/2017 27  22 - 32 mmol/L Final  . Glucose, Bld 09/02/2017 120* 65 - 99 mg/dL Final  . BUN 09/02/2017 33* 6 - 20 mg/dL Final  . Creatinine, Ser 09/02/2017 1.23* 0.44 - 1.00 mg/dL Final  . Calcium 09/02/2017 9.7  8.9 - 10.3 mg/dL Final  . Total Protein 09/02/2017 7.1  6.5 - 8.1 g/dL Final  . Albumin 09/02/2017 3.2* 3.5 - 5.0 g/dL Final  . AST 09/02/2017 43* 15 - 41 U/L Final  . ALT 09/02/2017 21  14 - 54 U/L Final  . Alkaline Phosphatase 09/02/2017 54  38 - 126 U/L Final  . Total Bilirubin 09/02/2017 0.7  0.3 - 1.2 mg/dL Final  . GFR calc non Af Amer 09/02/2017 39* >60 mL/min Final  . GFR calc Af Amer 09/02/2017 46* >60 mL/min Final   Comment: (NOTE) The eGFR has been calculated using the CKD EPI equation. This calculation has not been validated in all clinical situations. eGFR's persistently <60 mL/min signify possible Chronic Kidney Disease.   . Anion gap 09/02/2017 14  5 - 15 Final  . Troponin I 09/02/2017 0.11* <0.03 ng/mL Final   Comment: CRITICAL RESULT CALLED TO, READ BACK BY AND VERIFIED WITH: MYRICK,B ON 09/02/17 AT 1840 BY LOY,C   . B Natriuretic Peptide 09/02/2017 398.0* 0.0 - 100.0 pg/mL Final  . Magnesium 09/02/2017 1.2* 1.7 - 2.4 mg/dL Final  . Phosphorus  09/02/2017 3.2  2.5 - 4.6 mg/dL Final  . Adenovirus 09/03/2017 NOT DETECTED  NOT DETECTED Final  . Coronavirus 229E 09/03/2017 NOT DETECTED  NOT DETECTED Final  . Coronavirus HKU1 09/03/2017 NOT DETECTED  NOT DETECTED Final  . Coronavirus NL63 09/03/2017 NOT DETECTED  NOT DETECTED Final  . Coronavirus OC43 09/03/2017 NOT DETECTED  NOT DETECTED Final  . Metapneumovirus 09/03/2017 NOT DETECTED  NOT DETECTED Final  . Rhinovirus / Enterovirus 09/03/2017 NOT DETECTED  NOT DETECTED Final  . Influenza A 09/03/2017 NOT DETECTED  NOT DETECTED Final  . Influenza B 09/03/2017 NOT DETECTED  NOT DETECTED Final  . Parainfluenza Virus 1 09/03/2017 NOT DETECTED  NOT DETECTED Final  . Parainfluenza Virus 2 09/03/2017 NOT DETECTED  NOT DETECTED Final  . Parainfluenza Virus 3 09/03/2017 NOT DETECTED  NOT DETECTED Final  . Parainfluenza Virus 4 09/03/2017 NOT DETECTED  NOT DETECTED Final  . Respiratory Syncytial Virus 09/03/2017 NOT DETECTED  NOT DETECTED Final  . Bordetella pertussis 09/03/2017 NOT DETECTED  NOT DETECTED Final  . Chlamydophila pneumoniae 09/03/2017 NOT DETECTED  NOT DETECTED Final  . Mycoplasma pneumoniae 09/03/2017 NOT DETECTED  NOT DETECTED Final   Performed at [facility], [place] [address] [place], [place] [address]  . Sodium 09/03/2017 133* 135 - 145 mmol/L Final  . Potassium 09/03/2017 3.8  3.5 - 5.1 mmol/L Final  . Chloride 09/03/2017 94* 101 - 111 mmol/L Final  . CO2 09/03/2017 30  22 - 32 mmol/L Final  . Glucose, Bld 09/03/2017 158* 65 - 99  mg/dL Final  . BUN 09/03/2017 39* 6 - 20 mg/dL Final  . Creatinine, Ser 09/03/2017 1.32* 0.44 - 1.00 mg/dL Final  . Calcium 09/03/2017 9.3  8.9 - 10.3 mg/dL Final  . GFR calc non Af Amer 09/03/2017 36* >60 mL/min Final  . GFR calc Af Amer 09/03/2017 42* >60 mL/min Final   Comment: (NOTE) The eGFR has been calculated using the CKD EPI equation. This calculation has not been validated in all clinical situations. eGFR's persistently <60  mL/min signify possible Chronic Kidney Disease.   . Anion gap 09/03/2017 9  5 - 15 Final  . WBC 09/03/2017 19.8* 4.0 - 10.5 K/uL Final   WHITE COUNT CONFIRMED ON SMEAR  . RBC 09/03/2017 3.25* 3.87 - 5.11 MIL/uL Final  . Hemoglobin 09/03/2017 9.2* 12.0 - 15.0 g/dL Final  . HCT 09/03/2017 28.9* 36.0 - 46.0 % Final  . MCV 09/03/2017 88.9  78.0 - 100.0 fL Final  . MCH 09/03/2017 28.3  26.0 - 34.0 pg Final  . MCHC 09/03/2017 31.8  30.0 - 36.0 g/dL Final  . RDW 09/03/2017 14.0  11.5 - 15.5 % Final  . Platelets 09/03/2017 186  150 - 400 K/uL Final  . Neutrophils Relative % 09/03/2017 92  % Final  . Lymphocytes Relative 09/03/2017 7  % Final  . Monocytes Relative 09/03/2017 1  % Final  . Eosinophils Relative 09/03/2017 0  % Final  . Basophils Relative 09/03/2017 0  % Final  . Neutro Abs 09/03/2017 18.2* 1.7 - 7.7 K/uL Final  . Lymphs Abs 09/03/2017 1.4  0.7 - 4.0 K/uL Final  . Monocytes Absolute 09/03/2017 0.2  0.1 - 1.0 K/uL Final  . Eosinophils Absolute 09/03/2017 0.0  0.0 - 0.7 K/uL Final  . Basophils Absolute 09/03/2017 0.0  0.0 - 0.1 K/uL Final  . WBC Morphology 09/03/2017 MILD LEFT SHIFT (1-5% METAS, OCC MYELO, OCC BANDS)   Final  . Troponin I 09/02/2017 0.08* <0.03 ng/mL Final   CRITICAL VALUE NOTED.  VALUE IS CONSISTENT WITH PREVIOUSLY REPORTED AND CALLED VALUE.  . Troponin I 09/03/2017 0.05* <0.03 ng/mL Final   CRITICAL VALUE NOTED.  VALUE IS CONSISTENT WITH PREVIOUSLY REPORTED AND CALLED VALUE.  . Troponin I 09/03/2017 0.05* <0.03 ng/mL Final   CRITICAL VALUE NOTED.  VALUE IS CONSISTENT WITH PREVIOUSLY REPORTED AND CALLED VALUE.  Marland Kitchen Weight 09/03/2017 2,800  oz Final  . Height 09/03/2017 62  in Final  . BP 09/03/2017 112/59  mmHg Final  . Glucose-Capillary 09/02/2017 121* 65 - 99 mg/dL Final  . Comment 1 09/02/2017 Notify RN   Final  . Comment 2 09/02/2017 Document in Chart   Final  . Strep Pneumo Urinary Antigen 09/03/2017 NEGATIVE  NEGATIVE Final   Comment:        Infection  due to S. pneumoniae cannot be absolutely ruled out since the antigen present may be below the detection limit of the test.   . Glucose-Capillary 09/03/2017 158* 65 - 99 mg/dL Final  . Comment 1 09/03/2017 Notify RN   Final  . Comment 2 09/03/2017 Document in Chart   Final  . Glucose-Capillary 09/03/2017 171* 65 - 99 mg/dL Final  . Comment 1 09/03/2017 Notify RN   Final  . Comment 2 09/03/2017 Document in Chart   Final  . Glucose-Capillary 09/03/2017 181* 65 - 99 mg/dL Final  . Comment 1 09/03/2017 Notify RN   Final  . Comment 2 09/03/2017 Document in Chart   Final  . Sodium 09/04/2017 137  135 -  145 mmol/L Final  . Potassium 09/04/2017 3.4* 3.5 - 5.1 mmol/L Final  . Chloride 09/04/2017 96* 101 - 111 mmol/L Final  . CO2 09/04/2017 28  22 - 32 mmol/L Final  . Glucose, Bld 09/04/2017 175* 65 - 99 mg/dL Final  . BUN 09/04/2017 66* 6 - 20 mg/dL Final  . Creatinine, Ser 09/04/2017 1.44* 0.44 - 1.00 mg/dL Final  . Calcium 09/04/2017 9.4  8.9 - 10.3 mg/dL Final  . GFR calc non Af Amer 09/04/2017 33* >60 mL/min Final  . GFR calc Af Amer 09/04/2017 38* >60 mL/min Final   Comment: (NOTE) The eGFR has been calculated using the CKD EPI equation. This calculation has not been validated in all clinical situations. eGFR's persistently <60 mL/min signify possible Chronic Kidney Disease.   . Anion gap 09/04/2017 13  5 - 15 Final  . WBC 09/04/2017 18.5* 4.0 - 10.5 K/uL Final  . RBC 09/04/2017 3.57* 3.87 - 5.11 MIL/uL Final  . Hemoglobin 09/04/2017 10.2* 12.0 - 15.0 g/dL Final  . HCT 09/04/2017 31.7* 36.0 - 46.0 % Final  . MCV 09/04/2017 88.8  78.0 - 100.0 fL Final  . MCH 09/04/2017 28.6  26.0 - 34.0 pg Final  . MCHC 09/04/2017 32.2  30.0 - 36.0 g/dL Final  . RDW 09/04/2017 13.9  11.5 - 15.5 % Final  . Platelets 09/04/2017 244  150 - 400 K/uL Final  . Neutrophils Relative % 09/04/2017 89  % Final  . Neutro Abs 09/04/2017 16.5* 1.7 - 7.7 K/uL Final  . Lymphocytes Relative 09/04/2017 6  % Final   . Lymphs Abs 09/04/2017 1.2  0.7 - 4.0 K/uL Final  . Monocytes Relative 09/04/2017 5  % Final  . Monocytes Absolute 09/04/2017 0.8  0.1 - 1.0 K/uL Final  . Eosinophils Relative 09/04/2017 0  % Final  . Eosinophils Absolute 09/04/2017 0.0  0.0 - 0.7 K/uL Final  . Basophils Relative 09/04/2017 0  % Final  . Basophils Absolute 09/04/2017 0.0  0.0 - 0.1 K/uL Final  . Glucose-Capillary 09/03/2017 164* 65 - 99 mg/dL Final  . Glucose-Capillary 09/04/2017 158* 65 - 99 mg/dL Final  . Color, Urine 09/04/2017 YELLOW  YELLOW Final  . APPearance 09/04/2017 CLEAR  CLEAR Final  . Specific Gravity, Urine 09/04/2017 1.012  1.005 - 1.030 Final  . pH 09/04/2017 5.0  5.0 - 8.0 Final  . Glucose, UA 09/04/2017 NEGATIVE  NEGATIVE mg/dL Final  . Hgb urine dipstick 09/04/2017 NEGATIVE  NEGATIVE Final  . Bilirubin Urine 09/04/2017 NEGATIVE  NEGATIVE Final  . Ketones, ur 09/04/2017 NEGATIVE  NEGATIVE mg/dL Final  . Protein, ur 09/04/2017 NEGATIVE  NEGATIVE mg/dL Final  . Nitrite 09/04/2017 NEGATIVE  NEGATIVE Final  . Leukocytes, UA 09/04/2017 TRACE* NEGATIVE Final  . RBC / HPF 09/04/2017 NONE SEEN  0 - 5 RBC/hpf Final  . WBC, UA 09/04/2017 0-5  0 - 5 WBC/hpf Final  . Bacteria, UA 09/04/2017 NONE SEEN  NONE SEEN Final  . Squamous Epithelial / LPF 09/04/2017 0-5* NONE SEEN Final  . Hyaline Casts, UA 09/04/2017 PRESENT   Final  . Glucose-Capillary 09/04/2017 134* 65 - 99 mg/dL Final     Pathology Orders Placed This Encounter  Procedures  . MM Digital Screening    Standing Status:   Future    Standing Expiration Date:   06/26/2019    Order Specific Question:   Reason for Exam (SYMPTOM  OR DIAGNOSIS REQUIRED)    Answer:   left breast cancer    Order  Specific Question:   Preferred imaging location?    Answer:   [facility]       Zoila Shutter MD

## 2018-07-07 DIAGNOSIS — J449 Chronic obstructive pulmonary disease, unspecified: Secondary | ICD-10-CM | POA: Diagnosis not present

## 2018-08-06 DIAGNOSIS — J449 Chronic obstructive pulmonary disease, unspecified: Secondary | ICD-10-CM | POA: Diagnosis not present

## 2018-09-06 DIAGNOSIS — J449 Chronic obstructive pulmonary disease, unspecified: Secondary | ICD-10-CM | POA: Diagnosis not present

## 2018-10-07 DIAGNOSIS — J449 Chronic obstructive pulmonary disease, unspecified: Secondary | ICD-10-CM | POA: Diagnosis not present

## 2018-11-05 DIAGNOSIS — J449 Chronic obstructive pulmonary disease, unspecified: Secondary | ICD-10-CM | POA: Diagnosis not present

## 2018-12-06 DIAGNOSIS — J449 Chronic obstructive pulmonary disease, unspecified: Secondary | ICD-10-CM | POA: Diagnosis not present

## 2019-01-05 DIAGNOSIS — J449 Chronic obstructive pulmonary disease, unspecified: Secondary | ICD-10-CM | POA: Diagnosis not present

## 2019-02-05 DIAGNOSIS — J449 Chronic obstructive pulmonary disease, unspecified: Secondary | ICD-10-CM | POA: Diagnosis not present

## 2019-03-07 DIAGNOSIS — J449 Chronic obstructive pulmonary disease, unspecified: Secondary | ICD-10-CM | POA: Diagnosis not present

## 2019-04-07 DIAGNOSIS — J449 Chronic obstructive pulmonary disease, unspecified: Secondary | ICD-10-CM | POA: Diagnosis not present

## 2019-05-08 DIAGNOSIS — J449 Chronic obstructive pulmonary disease, unspecified: Secondary | ICD-10-CM | POA: Diagnosis not present

## 2019-06-07 DIAGNOSIS — J449 Chronic obstructive pulmonary disease, unspecified: Secondary | ICD-10-CM | POA: Diagnosis not present

## 2019-06-08 DIAGNOSIS — E1129 Type 2 diabetes mellitus with other diabetic kidney complication: Secondary | ICD-10-CM | POA: Diagnosis not present

## 2019-06-08 DIAGNOSIS — J9611 Chronic respiratory failure with hypoxia: Secondary | ICD-10-CM | POA: Diagnosis not present

## 2019-06-08 DIAGNOSIS — C50919 Malignant neoplasm of unspecified site of unspecified female breast: Secondary | ICD-10-CM | POA: Diagnosis not present

## 2019-06-08 DIAGNOSIS — Z79899 Other long term (current) drug therapy: Secondary | ICD-10-CM | POA: Diagnosis not present

## 2019-06-08 DIAGNOSIS — I10 Essential (primary) hypertension: Secondary | ICD-10-CM | POA: Diagnosis not present

## 2019-06-15 DIAGNOSIS — J9611 Chronic respiratory failure with hypoxia: Secondary | ICD-10-CM | POA: Diagnosis not present

## 2019-06-15 DIAGNOSIS — E785 Hyperlipidemia, unspecified: Secondary | ICD-10-CM | POA: Diagnosis not present

## 2019-06-15 DIAGNOSIS — E039 Hypothyroidism, unspecified: Secondary | ICD-10-CM | POA: Diagnosis not present

## 2019-06-15 DIAGNOSIS — Z853 Personal history of malignant neoplasm of breast: Secondary | ICD-10-CM | POA: Diagnosis not present

## 2019-06-15 DIAGNOSIS — E1129 Type 2 diabetes mellitus with other diabetic kidney complication: Secondary | ICD-10-CM | POA: Diagnosis not present

## 2019-06-17 DIAGNOSIS — Z23 Encounter for immunization: Secondary | ICD-10-CM | POA: Diagnosis not present

## 2019-06-23 ENCOUNTER — Encounter (HOSPITAL_COMMUNITY): Payer: Self-pay | Admitting: Emergency Medicine

## 2019-06-23 ENCOUNTER — Emergency Department (HOSPITAL_COMMUNITY)
Admission: EM | Admit: 2019-06-23 | Discharge: 2019-06-23 | Disposition: A | Discharge status: Home / Self Care | Payer: Medicare Other | Attending: Emergency Medicine | Admitting: Emergency Medicine

## 2019-06-23 ENCOUNTER — Emergency Department (HOSPITAL_COMMUNITY): Payer: Medicare Other

## 2019-06-23 ENCOUNTER — Other Ambulatory Visit: Payer: Self-pay

## 2019-06-23 DIAGNOSIS — Y9389 Activity, other specified: Secondary | ICD-10-CM | POA: Diagnosis not present

## 2019-06-23 DIAGNOSIS — S8002XA Contusion of left knee, initial encounter: Secondary | ICD-10-CM | POA: Diagnosis not present

## 2019-06-23 DIAGNOSIS — Y92019 Unspecified place in single-family (private) house as the place of occurrence of the external cause: Secondary | ICD-10-CM | POA: Insufficient documentation

## 2019-06-23 DIAGNOSIS — S52502A Unspecified fracture of the lower end of left radius, initial encounter for closed fracture: Secondary | ICD-10-CM

## 2019-06-23 DIAGNOSIS — E039 Hypothyroidism, unspecified: Secondary | ICD-10-CM | POA: Diagnosis not present

## 2019-06-23 DIAGNOSIS — Z87891 Personal history of nicotine dependence: Secondary | ICD-10-CM | POA: Diagnosis not present

## 2019-06-23 DIAGNOSIS — Y999 Unspecified external cause status: Secondary | ICD-10-CM | POA: Insufficient documentation

## 2019-06-23 DIAGNOSIS — S59911A Unspecified injury of right forearm, initial encounter: Secondary | ICD-10-CM | POA: Diagnosis present

## 2019-06-23 DIAGNOSIS — W010XXA Fall on same level from slipping, tripping and stumbling without subsequent striking against object, initial encounter: Secondary | ICD-10-CM | POA: Diagnosis not present

## 2019-06-23 DIAGNOSIS — Z853 Personal history of malignant neoplasm of breast: Secondary | ICD-10-CM | POA: Diagnosis not present

## 2019-06-23 DIAGNOSIS — I129 Hypertensive chronic kidney disease with stage 1 through stage 4 chronic kidney disease, or unspecified chronic kidney disease: Secondary | ICD-10-CM | POA: Insufficient documentation

## 2019-06-23 DIAGNOSIS — E1122 Type 2 diabetes mellitus with diabetic chronic kidney disease: Secondary | ICD-10-CM | POA: Insufficient documentation

## 2019-06-23 DIAGNOSIS — M7989 Other specified soft tissue disorders: Secondary | ICD-10-CM | POA: Diagnosis not present

## 2019-06-23 DIAGNOSIS — N182 Chronic kidney disease, stage 2 (mild): Secondary | ICD-10-CM | POA: Diagnosis not present

## 2019-06-23 DIAGNOSIS — Z79899 Other long term (current) drug therapy: Secondary | ICD-10-CM | POA: Diagnosis not present

## 2019-06-23 DIAGNOSIS — S8992XA Unspecified injury of left lower leg, initial encounter: Secondary | ICD-10-CM | POA: Diagnosis not present

## 2019-06-23 DIAGNOSIS — S52612A Displaced fracture of left ulna styloid process, initial encounter for closed fracture: Secondary | ICD-10-CM | POA: Diagnosis not present

## 2019-06-23 DIAGNOSIS — M25562 Pain in left knee: Secondary | ICD-10-CM | POA: Diagnosis not present

## 2019-06-23 MED ORDER — HYDROCODONE-ACETAMINOPHEN 5-325 MG PO TABS: 1.0000 | ORAL_TABLET | Freq: Four times a day (QID) | ORAL | 0 refills | Status: DC | PRN

## 2019-06-23 NOTE — ED Provider Notes (Signed)
[facility] EMERGENCY DEPARTMENT Provider Note   CSN: [id number] Arrival date & time: 06/23/19  1847     History   Chief Complaint Chief Complaint  Patient presents with   Fall    HPI Paige Rhodes is a 85 y.o. female.     HPI Patient presents after trip and fall onto outstretched left hand.  Presents with wrist pain and deformity.  She also landed on her left knee though she does not believe that she fractured through the.  She denies striking her head or loss of consciousness.  Denies any numbness or weakness. Past Medical History:  Diagnosis Date   Breast cancer ([place])    Chronic renal disease, stage 2, mildly decreased glomerular filtration rate between 60-89 mL/min/1.73 square meter 10/31/2014   COPD (chronic obstructive pulmonary disease) ([place])    Diabetes mellitus    Glaucoma    Gout attack june 2013   left foot   Hyperlipidemia    Hypertension    Hypothyroidism    Infiltrating ductal carcinoma of left breast 03/05/2011   Osteoporosis    Port catheter in place 01/15/2012   SOB (shortness of breath)    Thyroid disease     Patient Active Problem List   Diagnosis Date Noted   CAP (community acquired pneumonia) 09/02/2017   Elevated troponin 09/02/2017   Anemia 09/02/2017   Hypomagnesemia 09/02/2017   Chronic renal disease, stage 2, mildly decreased glomerular filtration rate between 60-89 mL/min/1.73 square meter 10/31/2014   Port catheter in place 01/15/2012   Controlled type 2 diabetes mellitus without complication ([place]) [id number]   Abnormal presence of protein in urine 01/15/2012   Adult hypothyroidism 01/15/2012   Hyperparathyroidism ([place]) 01/15/2012   HLD (hyperlipidemia) 01/15/2012   Gout 01/15/2012   Essential (primary) hypertension 01/15/2012   Chronic obstructive pulmonary disease ([place]) 01/15/2012   OP (osteoporosis) 01/15/2012   Infiltrating ductal carcinoma of left breast 03/05/2011   Overlapping malignant  neoplasm of female breast ([place]) 03/08/2010    Past Surgical History:  Procedure Laterality Date   [place] LUMPECTOMY  2011   left   CATARACT EXTRACTION W/PHACO  09/03/2012   Procedure: CATARACT EXTRACTION PHACO AND INTRAOCULAR LENS PLACEMENT ([place]);  Surgeon: Tonny Branch, MD;  Location: [facility];  Service: Ophthalmology;  Laterality: Right;  CDE: 15.26   CATARACT EXTRACTION W/PHACO  09/28/2012   Procedure: CATARACT EXTRACTION PHACO AND INTRAOCULAR LENS PLACEMENT (IOC);  Surgeon: Tonny Branch, MD;  Location: [facility];  Service: Ophthalmology;  Laterality: Left;  CDE:  21.60   CHOLECYSTECTOMY  1990   PORT-A-CATH REMOVAL N/A 04/26/2015   Procedure: REMOVAL PORT-A-CATH;  Surgeon: Aviva Signs, MD;  Location: [facility];  Service: General;  Laterality: N/A;   PORTACATH PLACEMENT  05/23/10   THYROID SURGERY  1994   TONSILLECTOMY     TOTAL HIP ARTHROPLASTY  1996     OB History   No obstetric history on file.      Home Medications    Prior to Admission medications   Medication Sig Start Date End Date Taking? Authorizing Provider  alendronate (FOSAMAX) 70 MG tablet Take 70 mg by mouth every 7 (seven) days. Take with a full glass of water on an empty stomach. **Takes on Wednesday**    [provider]  amLODipine (NORVASC) 5 MG tablet Take 5 mg by mouth daily.     [provider]  aspirin EC 81 MG tablet Take 81 mg by  mouth daily.    [provider]  Calcium-Magnesium-Vitamin D (CALCIUM 500 PO) Take 1 tablet by mouth daily.    [provider]  carvedilol (COREG) 25 MG tablet Take 25 mg by mouth 2 (two) times daily with a meal.      [provider]  Cholecalciferol (VITAMIN D-3) 1000 units CAPS Take 1 capsule by mouth daily.    [provider]  DiphenhydrAMINE HCl, Sleep, (NIGHTTIME SLEEP AID) 50 MG CAPS Take 1 capsule by mouth at bedtime.     [provider]  HYDROcodone-acetaminophen  (NORCO/VICODIN) 5-325 MG tablet Take 1 tablet by mouth every 6 (six) hours as needed for severe pain. 06/23/19   Julianne Rice, MD  ibuprofen (ADVIL,MOTRIN) 200 MG tablet Take 400 mg by mouth every 6 (six) hours as needed. For pain    [provider]  levothyroxine (SYNTHROID, LEVOTHROID) 125 MCG tablet Take 125 mcg by mouth daily.      [provider]  losartan (COZAAR) 50 MG tablet Take 50 mg by mouth daily.      [provider]  SPIRIVA HANDIHALER 18 MCG inhalation capsule Place 18 mcg into inhaler and inhale daily. 10/02/15   [provider]    Family History Family History  Problem Relation Age of Onset   Kidney disease Mother    Heart disease Father    Hypertension Brother    Cancer Sister        breast and lung    Social History Social History   Tobacco Use   Smoking status: Former Smoker    Packs/day: 3.00    Years: 30.00    Pack years: 90.00    Types: Cigarettes    Quit date: 09/02/1991    Years since quitting: 27.8   Smokeless tobacco: Never Used  Substance Use Topics   Alcohol use: No   Drug use: No     Allergies   Penicillins, Hctz [hydrochlorothiazide], Morphine and related, and Lotrel [amlodipine besy-benazepril hcl]   Review of Systems Review of Systems  Constitutional: Negative for chills and fever.  Musculoskeletal: Positive for arthralgias and joint swelling.  Skin: Negative for wound.  Neurological: Negative for syncope, weakness, light-headedness, numbness and headaches.  All other systems reviewed and are negative.    Physical Exam Updated Vital Signs BP (!) 168/75    Pulse (!) 59    Temp 98.2 F (36.8 C)    Resp 18    Ht 5\' 2"  (1.575 m)    Wt 78.2 kg    SpO2 99%    BMI 31.55 kg/m   Physical Exam Vitals signs and nursing note reviewed.  Constitutional:      Appearance: Normal appearance. She is well-developed.     Comments: Comfortable appearing  HENT:     Head: Normocephalic and  atraumatic.     Nose: Nose normal.     Mouth/Throat:     Mouth: Mucous membranes are moist.  Eyes:     Pupils: Pupils are equal, round, and reactive to light.  Neck:     Musculoskeletal: Normal range of motion and neck supple.  Cardiovascular:     Rate and Rhythm: Normal rate and regular rhythm.  Pulmonary:     Effort: Pulmonary effort is normal.     Breath sounds: Normal breath sounds.  Abdominal:     Palpations: Abdomen is soft.     Tenderness: There is no rebound.  Musculoskeletal:        General: Swelling and tenderness  present.     Comments: Patient with swelling and mild deformity to the left distal radius.  Full range of motion of the left elbow without discomfort.  Patient can freely move the fingers of the left hand without difficulty.  Patient with obvious anterior hematoma to the left knee.  She has full range of motion of the left knee with no ligamentous instability.  Skin:    General: Skin is warm and dry.     Findings: No erythema or rash.  Neurological:     General: No focal deficit present.     Mental Status: She is alert and oriented to person, place, and time.     Comments: 5/5 motor in all extremities.  Sensation intact.  Psychiatric:        Behavior: Behavior normal.      ED Treatments / Results  Labs (all labs ordered are listed, but only abnormal results are displayed) Labs Reviewed - No data to display  EKG None  Radiology Dg Wrist Complete Left  Result Date: 06/23/2019 CLINICAL DATA:  Pain EXAM: LEFT WRIST - COMPLETE 3+ VIEW COMPARISON:  None. FINDINGS: There is a comminuted impacted distal radius fracture with volar tilt at the wrist. There is question intra-articular extension at the ulnar surface of the distal radius. Tiny osseous fragment seen adjacent to the ulnar styloid, likely mildly displaced fracture. There is well corticated ossicle seen adjacent to the ulnar styloid just from prior injury. There is dorsal soft tissue swelling. There  is diffuse osteopenia. IMPRESSION: Comminuted impacted probable intra-articular fracture of the distal radius with volar tilt. Minimally displaced ulnar styloid fracture. Electronically Signed   By: Prudencio Pair M.D.   On: 06/23/2019 20:34   Dg Knee Complete 4 Views Left  Result Date: 06/23/2019 CLINICAL DATA:  Pain, fall EXAM: LEFT KNEE - COMPLETE 4+ VIEW COMPARISON:  None. FINDINGS: No evidence of fracture, dislocation, or joint effusion. There is diffuse osteopenia. Linear calcifications seen in the medial and lateral joint space. Prepatellar soft tissue swelling is seen. IMPRESSION: No acute osseous abnormality. Electronically Signed   By: Prudencio Pair M.D.   On: 06/23/2019 20:36    Procedures Procedures (including critical care time)  Medications Ordered in ED Medications - No data to display   Initial Impression / Assessment and Plan / ED Course  I have reviewed the triage vital signs and the nursing notes.  Pertinent labs & imaging results that were available during my care of the patient were reviewed by me and considered in my medical decision making (see chart for details).        Patient with evidence of distal radius fracture on x-ray.  Neurovascularly intact.  Will splint and have follow-up closely with Ortho.  Return precautions given.  Final Clinical Impressions(s) / ED Diagnoses   Final diagnoses:  Closed fracture of distal end of left radius, unspecified fracture morphology, initial encounter  Contusion of left knee, initial encounter    ED Discharge Orders         Ordered    HYDROcodone-acetaminophen (NORCO/VICODIN) 5-325 MG tablet  Every 6 hours PRN     06/23/19 2215           Julianne Rice, MD 06/23/19 2248

## 2019-06-23 NOTE — ED Triage Notes (Signed)
Pt c/o left wrist and knee pain after tripping on oxygen tubing and falling at home.

## 2019-06-24 ENCOUNTER — Ambulatory Visit (HOSPITAL_COMMUNITY): Payer: Medicare Other

## 2019-06-30 ENCOUNTER — Other Ambulatory Visit: Payer: Self-pay

## 2019-06-30 ENCOUNTER — Other Ambulatory Visit (HOSPITAL_COMMUNITY)
Admission: RE | Admit: 2019-06-30 | Discharge: 2019-06-30 | Disposition: A | Discharge status: Home / Self Care | Payer: Medicare Other | Source: Ambulatory Visit | Attending: Orthopedic Surgery | Admitting: Orthopedic Surgery

## 2019-06-30 ENCOUNTER — Encounter (HOSPITAL_COMMUNITY): Payer: Self-pay

## 2019-06-30 ENCOUNTER — Encounter (HOSPITAL_COMMUNITY)
Admission: RE | Admit: 2019-06-30 | Discharge: 2019-06-30 | Disposition: A | Discharge status: Home / Self Care | Payer: Medicare Other | Source: Ambulatory Visit | Attending: Orthopedic Surgery | Admitting: Orthopedic Surgery

## 2019-06-30 ENCOUNTER — Encounter: Payer: Self-pay | Admitting: Orthopedic Surgery

## 2019-06-30 ENCOUNTER — Ambulatory Visit: Payer: Medicare Other | Admitting: Orthopedic Surgery

## 2019-06-30 VITALS — BP 120/50 | HR 58 | Ht 62.0 in | Wt 172.0 lb

## 2019-06-30 DIAGNOSIS — S52532A Colles' fracture of left radius, initial encounter for closed fracture: Secondary | ICD-10-CM

## 2019-06-30 DIAGNOSIS — Z01812 Encounter for preprocedural laboratory examination: Secondary | ICD-10-CM | POA: Insufficient documentation

## 2019-06-30 HISTORY — DX: Unspecified hearing loss, unspecified ear: H91.90

## 2019-06-30 HISTORY — DX: Gastro-esophageal reflux disease without esophagitis: K21.9

## 2019-06-30 HISTORY — DX: Dependence on supplemental oxygen: Z99.81

## 2019-06-30 LAB — BASIC METABOLIC PANEL
Anion gap: 12 (ref 5–15)
BUN: 19 mg/dL (ref 8–23)
CO2: 29 mmol/L (ref 22–32)
Calcium: 9.6 mg/dL (ref 8.9–10.3)
Chloride: 97 mmol/L — ABNORMAL LOW (ref 98–111)
Creatinine, Ser: 0.63 mg/dL (ref 0.44–1.00)
GFR calc Af Amer: >60 mL/min (ref >60)
GFR calc non Af Amer: >60 mL/min (ref >60)
Glucose, Bld: 118 mg/dL — ABNORMAL HIGH (ref 70–99)
Potassium: 3.8 mmol/L (ref 3.5–5.1)
Sodium: 138 mmol/L (ref 135–145)

## 2019-06-30 LAB — CBC WITH DIFFERENTIAL/PLATELET
Abs Immature Granulocytes: 0.02 10*3/uL (ref 0.00–0.07)
Basophils Absolute: 0 10*3/uL (ref 0.0–0.1)
Basophils Relative: 0 %
Eosinophils Absolute: 0.3 10*3/uL (ref 0.0–0.5)
Eosinophils Relative: 4 %
HCT: 34.1 % — ABNORMAL LOW (ref 36.0–46.0)
Hemoglobin: 10.5 g/dL — ABNORMAL LOW (ref 12.0–15.0)
Immature Granulocytes: 0 %
Lymphocytes Relative: 15 %
Lymphs Abs: 1.4 10*3/uL (ref 0.7–4.0)
MCH: 29.2 pg (ref 26.0–34.0)
MCHC: 30.8 g/dL (ref 30.0–36.0)
MCV: 94.7 fL (ref 80.0–100.0)
Monocytes Absolute: 0.8 10*3/uL (ref 0.1–1.0)
Monocytes Relative: 9 %
Neutro Abs: 6.8 10*3/uL (ref 1.7–7.7)
Neutrophils Relative %: 72 %
Platelets: 263 10*3/uL (ref 150–400)
RBC: 3.6 MIL/uL — ABNORMAL LOW (ref 3.87–5.11)
RDW: 13.2 % (ref 11.5–15.5)
WBC: 9.3 10*3/uL (ref 4.0–10.5)
nRBC: 0 % (ref 0.0–0.2)

## 2019-06-30 LAB — SARS CORONAVIRUS 2 (TAT 6-24 HRS): SARS Coronavirus 2: NEGATIVE

## 2019-06-30 NOTE — H&P (Signed)
Paige Rhodes   06/30/2019   HISTORY SECTION :       Chief Complaint  Patient presents with  . Wrist Injury      06/23/2019 left wrist fracture     HPI 85 year old female presents with mild pain left wrist for 7 days associated with pain and swelling after falling over her oxygen wire at home.  She is on Fosamax and vitamin D, she is a household ambulator with no assistive devices, primary care Dr. [name]     Review of Systems  Respiratory: Positive for shortness of breath.   Cardiovascular: Negative for chest pain.  Gastrointestinal: Negative.   All other systems reviewed and are negative.      has a past medical history of Breast cancer ([place]), Chronic renal disease, stage 2, mildly decreased glomerular filtration rate between 60-89 mL/min/1.73 square meter (10/31/2014), COPD (chronic obstructive pulmonary disease) ([place]), Diabetes mellitus, Glaucoma, Gout attack (june 2013), Hyperlipidemia, Hypertension, Hypothyroidism, Infiltrating ductal carcinoma of left breast (03/05/2011), Osteoporosis, Port catheter in place (01/15/2012), SOB (shortness of breath), and Thyroid disease.    Past Surgical History:  Procedure Laterality Date  . ANKLE SURGERY   1998  . APPENDECTOMY   1955  . BREAST LUMPECTOMY   2011    left  . CATARACT EXTRACTION W/PHACO   09/03/2012    Procedure: CATARACT EXTRACTION PHACO AND INTRAOCULAR LENS PLACEMENT (IOC);  Surgeon: Tonny Branch, MD;  Location: [facility];  Service: Ophthalmology;  Laterality: Right;  CDE: 15.26  . CATARACT EXTRACTION W/PHACO   09/28/2012    Procedure: CATARACT EXTRACTION PHACO AND INTRAOCULAR LENS PLACEMENT (I[facility]);  Surgeon: Tonny Branch, MD;  Location: [facility];  Service: Ophthalmology;  Laterality: Left;  CDE:  21.60  . CHOLECYSTECTOMY   1990  . PORT-A-CATH REMOVAL N/A 04/26/2015    Procedure: REMOVAL PORT-A-CATH;  Surgeon: Aviva Signs, MD;  Location: [facility];  Service: General;  Laterality: N/A;  . PORTACATH PLACEMENT   05/23/10  . THYROID SURGERY   1994   . TONSILLECTOMY      . TOTAL HIP ARTHROPLASTY   1996      Body mass index is 31.46 kg/m.          Allergies  Allergen Reactions  . Penicillins Anaphylaxis      Edema of throat  . Hctz [Hydrochlorothiazide]        gout  . Morphine And Related Itching  . Lotrel [Amlodipine Besy-Benazepril Hcl]        Facial muscles draw and break out in whilps        Current Outpatient Medications:  .  alendronate (FOSAMAX) 70 MG tablet, Take 70 mg by mouth every 7 (seven) days. Take with a full glass of water on an empty stomach. **Takes on Wednesday**, Disp: , Rfl:  .  amLODipine (NORVASC) 5 MG tablet, Take 5 mg by mouth daily. , Disp: , Rfl:  .  aspirin EC 81 MG tablet, Take 81 mg by mouth daily., Disp: , Rfl:  .  Calcium-Magnesium-Vitamin D (CALCIUM 500 PO), Take 1 tablet by mouth daily., Disp: , Rfl:  .  carvedilol (COREG) 25 MG tablet, Take 25 mg by mouth 2 (two) times daily with a meal.  , Disp: , Rfl:  .  Cholecalciferol (VITAMIN D-3) 1000 units CAPS, Take 1 capsule by mouth daily., Disp: , Rfl:  .  DiphenhydrAMINE HCl, Sleep, (NIGHTTIME SLEEP AID) 50 MG CAPS, Take 1 capsule by mouth at bedtime. , Disp: , Rfl:  .  HYDROcodone-acetaminophen (  NORCO/VICODIN) 5-325 MG tablet, Take 1 tablet by mouth every 6 (six) hours as needed for severe pain., Disp: 6 tablet, Rfl: 0 .  ibuprofen (ADVIL,MOTRIN) 200 MG tablet, Take 400 mg by mouth every 6 (six) hours as needed. For pain, Disp: , Rfl:  .  levothyroxine (SYNTHROID, LEVOTHROID) 125 MCG tablet, Take 125 mcg by mouth daily.  , Disp: , Rfl:  .  losartan (COZAAR) 50 MG tablet, Take 50 mg by mouth daily.  , Disp: , Rfl:  .  SPIRIVA HANDIHALER 18 MCG inhalation capsule, Place 18 mcg into inhaler and inhale daily., Disp: , Rfl:  No current facility-administered medications for this visit.    Facility-Administered Medications Ordered in Other Visits:  .  sodium chloride 0.9 % injection 10 mL, 10 mL, Intravenous, PRN, Farrel Gobble, MD, 10 mL at  05/05/14 1150     PHYSICAL EXAM SECTION: 1) BP (!) 120/50   Pulse (!) 58   Ht 5\' 2"  (1.575 m)   Wt 172 lb (78 kg)   BMI 31.46 kg/m   Body mass index is 31.46 kg/m. General appearance: Well-developed well-nourished no gross deformities  2) Cardiovascular normal pulse and perfusion in the UPPER extremities normal color without edema  3) Neurologically deep tendon reflexes are equal and normal, no sensation loss or deficits no pathologic reflexes   4) Psychological: Awake alert and oriented x3 mood and affect normal   5) Skin no lacerations or ulcerations no nodularity no palpable masses, no erythema or nodularity   6) Musculoskeletal:    Right wrist is okay looks normal   Left wrist is deformed tenderness at the fracture site neurovascular exam is intact wrist motion is painful and limited by pain muscle tone is normal without atrophy stability cannot be determined neurovascular exam was intact skin was normal   MEDICAL DECISION SECTION:      Encounter Diagnosis  Name Primary?  . Colles' fracture of left radius, initial encounter for closed fracture Yes      Imaging The hospital has 4 images for me to look at the first 1 is a lateral view it shows significant dorsal tilt of the distal radius Colles' type fracture With severe shortening loss of radial inclination there may be an ulnar styloid fracture     Plan:  (Rx., Inj., surg., Frx, MRI/CT, XR:65)   85 year old female opts for surgical treatment of left wrist fracture   Recommend open treatment internal fixation volar wrist plate left wrist   The procedure has been fully reviewed with the patient; The risks and benefits of surgery have been discussed and explained and understood. Alternative treatment has also been reviewed, questions were encouraged and answered. The postoperative plan is also been reviewed.     10:56 AM Arther Abbott, MD   06/30/2019

## 2019-06-30 NOTE — Patient Instructions (Signed)
Wrist Fracture Treated With ORIF A wrist fracture is a break or crack in one of the bones of the wrist. The wrist is made up of eight small bones at the palm of the hand (carpal bones) and two long bones that make up the forearm (radius and ulna). If the fracture is displaced, it means that one or more parts of a bone have been moved out of their normal position and the bones are not lined up correctly. Open reduction with internal fixation (ORIF) is a surgical procedure that is used to treat a displaced wrist fracture. In this procedure, a surgeon will put the bone pieces back together. The surgeon will put in plates, screws, or other types of hardware to hold the bones in place. The procedure helps the bones heal properly. Tell a health care provider about:  Any allergies you have.  All medicines you are taking, including vitamins, herbs, eye drops, creams, and over-the-counter medicines.  Any problems you or family members have had with anesthetic medicines.  Any blood disorders you have.  Any surgeries you have had.  Any medical conditions you have.  Whether you are pregnant or may be pregnant. What are the risks? Generally, this is a safe procedure. However, problems may occur, including:  Infection.  Bleeding.  Failure of the bone to heal.  Wrist stiffness.  Damage to other structures.  Allergic reactions to medicines. What happens before the procedure? Staying hydrated Follow instructions from your health care provider about hydration, which may include:  Up to 2 hours before the procedure - you may continue to drink clear liquids, such as water, clear fruit juice, black coffee, and plain tea.  Eating and drinking restrictions Follow instructions from your health care provider about eating and drinking, which may include:  8 hours before the procedure - stop eating heavy meals or foods, such as meat, fried foods, or fatty foods.  6 hours before the procedure - stop  eating light meals or foods, such as toast or cereal.  6 hours before the procedure - stop drinking milk or drinks that contain milk.  2 hours before the procedure - stop drinking clear liquids. Medicines Ask your health care provider about:  Changing or stopping your regular medicines. This is especially important if you are taking diabetes medicines or blood thinners.  Taking medicines such as aspirin and ibuprofen. These medicines can thin your blood. Do not take these medicines unless your health care provider tells you to take them.  Taking over-the-counter medicines, vitamins, herbs, and supplements. General instructions  Plan to have someone take you home from the hospital or clinic.  Plan to have a responsible adult care for you for at least 24 hours after you leave the hospital or clinic. This is important.  Ask your health care provider what steps will be taken to help prevent infection. These may include: ? Removing hair at the surgery site. ? Washing skin with a germ-killing soap. ? Taking antibiotic medicine. What happens during the procedure?   An IV will be inserted into one of your veins.  You will be given one or more of the following: ? A medicine to help you relax (sedative). ? A medicine to numb the area (local anesthetic). ? A medicine to make you fall asleep (general anesthetic). ? A medicine that is injected into an area of your body to numb everything below the injection site (regional anesthetic).  The surgeon will make an incision through your skin over the  area of the fracture.  The broken bones will be returned to their normal positions. To hold the bones in place, the surgeon will use screws and a metal plate or different types of wiring.  The surgeon will close the incision with stitches (sutures) or staples.  A bandage (dressing) will be placed over your incision.  A splint or cast may be placed over your dressing. The procedure may vary among  health care providers and hospitals. What happens after the procedure?  Your blood pressure, heart rate, breathing rate, and blood oxygen level will be monitored until you leave the hospital or clinic.  You will be given medicine as needed for pain.  Your wrist will be placed on pillows to keep it raised (elevated). This will help prevent swelling.  You may have physical therapy while you are in the hospital.  You may be sent home with a sling to support your arm.  Do not drive until your health care provider approves. Summary  If a wrist fracture is displaced, it means that one or more parts of a bone have been moved out of their normal position and the bones are not lined up correctly.  A displaced wrist fracture will be treated with a surgical procedure that is called open reduction with internal fixation (ORIF).  The bone pieces are put back together and held in place by plates, screws, or other types of hardware.  Follow instructions from your health care provider about eating and drinking before the procedure. This information is not intended to replace advice given to you by your health care provider. Make sure you discuss any questions you have with your health care provider. Document Released: 07/31/2015 Document Revised: 01/06/2018 Document Reviewed: 01/06/2018 Elsevier Patient Education  2020 [employer]  

## 2019-06-30 NOTE — Patient Instructions (Signed)
Paige Rhodes  06/30/2019     @PREFPERIOPPHARMACY @   Your procedure is scheduled on  07/01/2019.  Report to [facility] at  0900  A.M.  Call this number if you have problems the morning of surgery:  [phone]   Remember:  Do not eat or drink after midnight.                         Take these medicines the morning of surgery with A SIP OF WATER  Amlodipine, carvedilol, hydrocodone(if needed), levothyroxine. Use your inhaler before you come.    Do not wear jewelry, make-up or nail polish.  Do not wear lotions, powders, or perfumes, or deodorant. Please brush your teeth.  Do not shave 48 hours prior to surgery.  Men may shave face and neck.  Do not bring valuables to the hospital.  [facility] is not responsible for any belongings or valuables.  Contacts, dentures or bridgework may not be worn into surgery.  Leave your suitcase in the car.  After surgery it may be brought to your room.  For patients admitted to the hospital, discharge time will be determined by your treatment team.  Patients discharged the day of surgery will not be allowed to drive home.   Name and phone number of your driver:   family Special instructions:  None  Please read over the following fact sheets that you were given. Anesthesia Post-op Instructions and Care and Recovery After Surgery       Wrist Fracture Treated With ORIF, Care After This sheet gives you information about how to care for yourself after your procedure. Your health care provider may also give you more specific instructions. If you have problems or questions, contact your health care provider. What can I expect after the procedure? After the procedure, it is common to have:  Pain.  Swelling.  Stiffness.  A small amount of drainage from the incision. Follow these instructions at home: If you have a cast:  Do not stick anything inside the cast to scratch your skin. Doing that increases your risk of infection.   Check the skin around the cast every day. Tell your health care provider about any concerns.  You may put lotion on dry skin around the edges of the cast. Do not put lotion on the skin underneath the cast.  Keep the cast clean and dry. If you have a splint or sling:  Wear the splint or sling as told by your health care provider. Remove it only as told by your health care provider.  Loosen the splint or sling if your fingers tingle, become numb, or turn cold and blue.  Keep the splint or sling clean and dry. Bathing  Do not take baths, swim, or use a hot tub until your health care provider approves. Ask your health care provider if you may take showers. You may only be allowed to take sponge baths.  If your cast, splint, or sling is not waterproof: ? Do not let it get wet. ? Cover it with a watertight covering when you take a bath or shower.  If you have a sling, remove it for bathing only if your health care provider tells you that it is safe to do so.  Keep the bandage (dressing) dry until your health care provider says it can be removed. Incision care   Follow instructions from your health care provider about how  to take care of your incision. Make sure you: ? Wash your hands with soap and water before you change your bandage (dressing). If soap and water are not available, use hand sanitizer. ? Change your dressing as told by your health care provider. ? Leave stitches (sutures), skin glue, or adhesive strips in place. These skin closures may need to stay in place for 2 weeks or longer. If adhesive strip edges start to loosen and curl up, you may trim the loose edges. Do not remove adhesive strips completely unless your health care provider tells you to do that.  Check your incision area every day for signs of infection. Check for: ? Redness. ? More swelling or pain. ? Blood or more fluid. ? Warmth. ? Pus or a bad smell. Managing pain, stiffness, and swelling   If  directed, put ice on the injured area. ? If you have a removable splint or sling, remove it as told by your health care provider. ? Put ice in a plastic bag. ? Place a towel between your skin and the bag or between your cast and the bag. ? Leave the ice on for 20 minutes, 2-3 times a day.  Move your fingers often to avoid stiffness and to lessen swelling.  Raise (elevate) the injured area above the level of your heart while you are sitting or lying down. Driving  Do not drive or use heavy machinery while taking prescription pain medicine.  Do not drive for 24 hours if you were given a sedative during your procedure.  Ask your health care provider when it is safe to drive if you have a cast, splint, or sling on your wrist. Activity  Return to your normal activities as told by your health care provider. Ask your health care provider what activities are safe for you.  Do exercises as told by your health care provider.  Do not lift with your injured wrist until your health care provider approves.  Avoid pulling and pushing. General instructions  Do not put pressure on any part of the cast or splint until it is fully hardened. This may take several hours.  Take over-the-counter and prescription medicines only as told by your health care provider.  Do not use any products that contain nicotine or tobacco, such as cigarettes and e-cigarettes. These can delay bone healing after surgery. If you need help quitting, ask your health care provider.  If you were prescribed pain medicine, take steps to prevent or treat constipation. Your health care provider may recommend that you: ? Drink enough fluid to keep your urine pale yellow. ? Take over-the-counter or prescription medicines. ? Eat foods that are high in fiber, such as beans, whole grains, and fresh fruits and vegetables. ? Limit foods that are high in fat and processed sugars, such as fried or sweet foods.  Keep all follow-up visits  as told by your health care provider. This is important. Contact a health care provider if:  Your cast, splint, or sling is damaged or loose.  Your pain is not controlled with medicine.  You have a fever.  You have redness around your incision.  You have more swelling or pain around your incision.  You have blood or more fluid coming from your incision.  Your incision feels warm to the touch.  You have pus or a bad smell coming from your incision or your dressing.  You develop a rash. Get help right away if:  Your skin or fingers on your  injured arm turn blue or gray.  Your arm feels cold or numb.  You have severe pain in your injured wrist.  You have trouble breathing.  You feel faint or light-headed. Summary  After the procedure, it is common to have pain, swelling, stiffness, and a small amount of drainage from the incision.  You may use ice, elevation, and pain medicine as told by your health care provider to lessen pain and swelling.  Wear your splint or sling as told by your health care provider.  Do not lift with your injured wrist until your health care provider approves. This information is not intended to replace advice given to you by your health care provider. Make sure you discuss any questions you have with your health care provider. Document Released: 07/31/2015 Document Revised: 01/06/2018 Document Reviewed: 01/06/2018 Elsevier Patient Education  2020 [place] Anesthesia, Adult, Care After This sheet gives you information about how to care for yourself after your procedure. Your health care provider may also give you more specific instructions. If you have problems or questions, contact your health care provider. What can I expect after the procedure? After the procedure, the following side effects are common:  Pain or discomfort at the IV site.  Nausea.  Vomiting.  Sore throat.  Trouble concentrating.  Feeling cold or chills.   Weak or tired.  Sleepiness and fatigue.  Soreness and body aches. These side effects can affect parts of the body that were not involved in surgery. Follow these instructions at home:  For at least 24 hours after the procedure:  Have a responsible adult stay with you. It is important to have someone help care for you until you are awake and alert.  Rest as needed.  Do not: ? Participate in activities in which you could fall or become injured. ? Drive. ? Use heavy machinery. ? Drink alcohol. ? Take sleeping pills or medicines that cause drowsiness. ? Make important decisions or sign legal documents. ? Take care of children on your own. Eating and drinking  Follow any instructions from your health care provider about eating or drinking restrictions.  When you feel hungry, start by eating small amounts of foods that are soft and easy to digest (bland), such as toast. Gradually return to your regular diet.  Drink enough fluid to keep your urine pale yellow.  If you vomit, rehydrate by drinking water, juice, or clear broth. General instructions  If you have sleep apnea, surgery and certain medicines can increase your risk for breathing problems. Follow instructions from your health care provider about wearing your sleep device: ? Anytime you are sleeping, including during daytime naps. ? While taking prescription pain medicines, sleeping medicines, or medicines that make you drowsy.  Return to your normal activities as told by your health care provider. Ask your health care provider what activities are safe for you.  Take over-the-counter and prescription medicines only as told by your health care provider.  If you smoke, do not smoke without supervision.  Keep all follow-up visits as told by your health care provider. This is important. Contact a health care provider if:  You have nausea or vomiting that does not get better with medicine.  You cannot eat or drink without  vomiting.  You have pain that does not get better with medicine.  You are unable to pass urine.  You develop a skin rash.  You have a fever.  You have redness around your IV site that gets  worse. Get help right away if:  You have difficulty breathing.  You have chest pain.  You have blood in your urine or stool, or you vomit blood. Summary  After the procedure, it is common to have a sore throat or nausea. It is also common to feel tired.  Have a responsible adult stay with you for the first 24 hours after general anesthesia. It is important to have someone help care for you until you are awake and alert.  When you feel hungry, start by eating small amounts of foods that are soft and easy to digest (bland), such as toast. Gradually return to your regular diet.  Drink enough fluid to keep your urine pale yellow.  Return to your normal activities as told by your health care provider. Ask your health care provider what activities are safe for you. This information is not intended to replace advice given to you by your health care provider. Make sure you discuss any questions you have with your health care provider. Document Released: 11/25/2000 Document Revised: 08/22/2017 Document Reviewed: 04/04/2017 Elsevier Patient Education  2020 [employer]. How to Use Chlorhexidine for Bathing Chlorhexidine gluconate (CHG) is a germ-killing (antiseptic) solution that is used to clean the skin. It can get rid of the bacteria that normally live on the skin and can keep them away for about 24 hours. To clean your skin with CHG, you may be given:  A CHG solution to use in the shower or as part of a sponge bath.  A prepackaged cloth that contains CHG. Cleaning your skin with CHG may help lower the risk for infection:  While you are staying in the intensive care unit of the hospital.  If you have a vascular access, such as a central line, to provide short-term or long-term access to your  veins.  If you have a catheter to drain urine from your bladder.  If you are on a ventilator. A ventilator is a machine that helps you breathe by moving air in and out of your lungs.  After surgery. What are the risks? Risks of using CHG include:  A skin reaction.  Hearing loss, if CHG gets in your ears.  Eye injury, if CHG gets in your eyes and is not rinsed out.  The CHG product catching fire. Make sure that you avoid smoking and flames after applying CHG to your skin. Do not use CHG:  If you have a chlorhexidine allergy or have previously reacted to chlorhexidine.  On babies younger than 53 months of age. How to use CHG solution  Use CHG only as told by your health care provider, and follow the instructions on the label.  Use the full amount of CHG as directed. Usually, this is one bottle. During a shower Follow these steps when using CHG solution during a shower (unless your health care provider gives you different instructions): 1. Start the shower. 2. Use your normal soap and shampoo to wash your face and hair. 3. Turn off the shower or move out of the shower stream. 4. Pour the CHG onto a clean washcloth. Do not use any type of brush or rough-edged sponge. 5. Starting at your neck, lather your body down to your toes. Make sure you follow these instructions: ? If you will be having surgery, pay special attention to the part of your body where you will be having surgery. Scrub this area for at least 1 minute. ? Do not use CHG on your head or face.  If the solution gets into your ears or eyes, rinse them well with water. ? Avoid your genital area. ? Avoid any areas of skin that have broken skin, cuts, or scrapes. ? Scrub your back and under your arms. Make sure to wash skin folds. 6. Let the lather sit on your skin for 1-2 minutes or as long as told by your health care provider. 7. Thoroughly rinse your entire body in the shower. Make sure that all body creases and crevices  are rinsed well. 8. Dry off with a clean towel. Do not put any substances on your body afterward-such as powder, lotion, or perfume-unless you are told to do so by your health care provider. Only use lotions that are recommended by the manufacturer. 9. Put on clean clothes or pajamas. 10. If it is the night before your surgery, sleep in clean sheets.  During a sponge bath Follow these steps when using CHG solution during a sponge bath (unless your health care provider gives you different instructions): 1. Use your normal soap and shampoo to wash your face and hair. 2. Pour the CHG onto a clean washcloth. 3. Starting at your neck, lather your body down to your toes. Make sure you follow these instructions: ? If you will be having surgery, pay special attention to the part of your body where you will be having surgery. Scrub this area for at least 1 minute. ? Do not use CHG on your head or face. If the solution gets into your ears or eyes, rinse them well with water. ? Avoid your genital area. ? Avoid any areas of skin that have broken skin, cuts, or scrapes. ? Scrub your back and under your arms. Make sure to wash skin folds. 4. Let the lather sit on your skin for 1-2 minutes or as long as told by your health care provider. 5. Using a different clean, wet washcloth, thoroughly rinse your entire body. Make sure that all body creases and crevices are rinsed well. 6. Dry off with a clean towel. Do not put any substances on your body afterward-such as powder, lotion, or perfume-unless you are told to do so by your health care provider. Only use lotions that are recommended by the manufacturer. 7. Put on clean clothes or pajamas. 8. If it is the night before your surgery, sleep in clean sheets. How to use CHG prepackaged cloths  Only use CHG cloths as told by your health care provider, and follow the instructions on the label.  Use the CHG cloth on clean, dry skin.  Do not use the CHG cloth on  your head or face unless your health care provider tells you to.  When washing with the CHG cloth: ? Avoid your genital area. ? Avoid any areas of skin that have broken skin, cuts, or scrapes. Before surgery Follow these steps when using a CHG cloth to clean before surgery (unless your health care provider gives you different instructions): 1. Using the CHG cloth, vigorously scrub the part of your body where you will be having surgery. Scrub using a back-and-forth motion for 3 minutes. The area on your body should be completely wet with CHG when you are done scrubbing. 2. Do not rinse. Discard the cloth and let the area air-dry. Do not put any substances on the area afterward, such as powder, lotion, or perfume. 3. Put on clean clothes or pajamas. 4. If it is the night before your surgery, sleep in clean sheets.  For general bathing  Follow these steps when using CHG cloths for general bathing (unless your health care provider gives you different instructions). 1. Use a separate CHG cloth for each area of your body. Make sure you wash between any folds of skin and between your fingers and toes. Wash your body in the following order, switching to a new cloth after each step: ? The front of your neck, shoulders, and chest. ? Both of your arms, under your arms, and your hands. ? Your stomach and groin area, avoiding the genitals. ? Your right leg and foot. ? Your left leg and foot. ? The back of your neck, your back, and your buttocks. 2. Do not rinse. Discard the cloth and let the area air-dry. Do not put any substances on your body afterward-such as powder, lotion, or perfume-unless you are told to do so by your health care provider. Only use lotions that are recommended by the manufacturer. 3. Put on clean clothes or pajamas. Contact a health care provider if:  Your skin gets irritated after scrubbing.  You have questions about using your solution or cloth. Get help right away if:  Your  eyes become very red or swollen.  Your eyes itch badly.  Your skin itches badly and is red or swollen.  Your hearing changes.  You have trouble seeing.  You have swelling or tingling in your mouth or throat.  You have trouble breathing.  You swallow any chlorhexidine. Summary  Chlorhexidine gluconate (CHG) is a germ-killing (antiseptic) solution that is used to clean the skin. Cleaning your skin with CHG may help to lower your risk for infection.  You may be given CHG to use for bathing. It may be in a bottle or in a prepackaged cloth to use on your skin. Carefully follow your health care provider's instructions and the instructions on the product label.  Do not use CHG if you have a chlorhexidine allergy.  Contact your health care provider if your skin gets irritated after scrubbing. This information is not intended to replace advice given to you by your health care provider. Make sure you discuss any questions you have with your health care provider. Document Released: 05/13/2012 Document Revised: 11/05/2018 Document Reviewed: 07/17/2017 Elsevier Patient Education  2020 [employer].

## 2019-06-30 NOTE — Progress Notes (Signed)
Paige Rhodes  06/30/2019  HISTORY SECTION :  Chief Complaint  Patient presents with  . Wrist Injury    06/23/2019 left wrist fracture    HPI 85 year old female presents with mild pain left wrist for 7 days associated with pain and swelling after falling over her oxygen wire at home.  She is on Fosamax and vitamin D, she is a household ambulator with no assistive devices, primary care Dr. Willey Rhodes   Review of Systems  Respiratory: Positive for shortness of breath.   Cardiovascular: Negative for chest pain.  Gastrointestinal: Negative.   All other systems reviewed and are negative.    has a past medical history of Breast cancer ([place]), Chronic renal disease, stage 2, mildly decreased glomerular filtration rate between 60-89 mL/min/1.73 square meter (10/31/2014), COPD (chronic obstructive pulmonary disease) ([place]), Diabetes mellitus, Glaucoma, Gout attack (june 2013), Hyperlipidemia, Hypertension, Hypothyroidism, Infiltrating ductal carcinoma of left breast (03/05/2011), Osteoporosis, Port catheter in place (01/15/2012), SOB (shortness of breath), and Thyroid disease.   Past Surgical History:  Procedure Laterality Date  . ANKLE SURGERY  1998  . APPENDECTOMY  1955  . BREAST LUMPECTOMY  2011   left  . CATARACT EXTRACTION W/PHACO  09/03/2012   Procedure: CATARACT EXTRACTION PHACO AND INTRAOCULAR LENS PLACEMENT (IOC);  Surgeon: Paige Branch, MD;  Location: [facility];  Service: Ophthalmology;  Laterality: Right;  CDE: 15.26  . CATARACT EXTRACTION W/PHACO  09/28/2012   Procedure: CATARACT EXTRACTION PHACO AND INTRAOCULAR LENS PLACEMENT (IOC);  Surgeon: Paige Branch, MD;  Location: [facility];  Service: Ophthalmology;  Laterality: Left;  CDE:  21.60  . CHOLECYSTECTOMY  1990  . PORT-A-CATH REMOVAL N/A 04/26/2015   Procedure: REMOVAL PORT-A-CATH;  Surgeon: Paige Signs, MD;  Location: [facility];  Service: General;  Laterality: N/A;  . PORTACATH PLACEMENT  05/23/10  . THYROID SURGERY  1994  . TONSILLECTOMY    .  TOTAL HIP ARTHROPLASTY  1996    Body mass index is 31.46 kg/m.   Allergies  Allergen Reactions  . Penicillins Anaphylaxis    Edema of throat  . Hctz [Hydrochlorothiazide]     gout  . Morphine And Related Itching  . Lotrel [Amlodipine Besy-Benazepril Hcl]     Facial muscles draw and break out in whilps     Current Outpatient Medications:  .  alendronate (FOSAMAX) 70 MG tablet, Take 70 mg by mouth every 7 (seven) days. Take with a full glass of water on an empty stomach. **Takes on Wednesday**, Disp: , Rfl:  .  amLODipine (NORVASC) 5 MG tablet, Take 5 mg by mouth daily. , Disp: , Rfl:  .  aspirin EC 81 MG tablet, Take 81 mg by mouth daily., Disp: , Rfl:  .  Calcium-Magnesium-Vitamin D (CALCIUM 500 PO), Take 1 tablet by mouth daily., Disp: , Rfl:  .  carvedilol (COREG) 25 MG tablet, Take 25 mg by mouth 2 (two) times daily with a meal.  , Disp: , Rfl:  .  Cholecalciferol (VITAMIN D-3) 1000 units CAPS, Take 1 capsule by mouth daily., Disp: , Rfl:  .  DiphenhydrAMINE HCl, Sleep, (NIGHTTIME SLEEP AID) 50 MG CAPS, Take 1 capsule by mouth at bedtime. , Disp: , Rfl:  .  HYDROcodone-acetaminophen (NORCO/VICODIN) 5-325 MG tablet, Take 1 tablet by mouth every 6 (six) hours as needed for severe pain., Disp: 6 tablet, Rfl: 0 .  ibuprofen (ADVIL,MOTRIN) 200 MG tablet, Take 400 mg by mouth every 6 (six) hours as needed. For pain, Disp: , Rfl:  .  levothyroxine (  SYNTHROID, LEVOTHROID) 125 MCG tablet, Take 125 mcg by mouth daily.  , Disp: , Rfl:  .  losartan (COZAAR) 50 MG tablet, Take 50 mg by mouth daily.  , Disp: , Rfl:  .  SPIRIVA HANDIHALER 18 MCG inhalation capsule, Place 18 mcg into inhaler and inhale daily., Disp: , Rfl:  No current facility-administered medications for this visit.   Facility-Administered Medications Ordered in Other Visits:  .  sodium chloride 0.9 % injection 10 mL, 10 mL, Intravenous, PRN, Paige Gobble, MD, 10 mL at 05/05/14 1150   PHYSICAL EXAM SECTION: 1) BP (!)  120/50   Pulse (!) 58   Ht 5\' 2"  (1.575 m)   Wt 172 lb (78 kg)   BMI 31.46 kg/m   Body mass index is 31.46 kg/m. General appearance: Well-developed well-nourished no gross deformities  2) Cardiovascular normal pulse and perfusion in the UPPER extremities normal color without edema  3) Neurologically deep tendon reflexes are equal and normal, no sensation loss or deficits no pathologic reflexes  4) Psychological: Awake alert and oriented x3 mood and affect normal  5) Skin no lacerations or ulcerations no nodularity no palpable masses, no erythema or nodularity  6) Musculoskeletal:   Right wrist is okay looks normal  Left wrist is deformed tenderness at the fracture site neurovascular exam is intact wrist motion is painful and limited by pain muscle tone is normal without atrophy stability cannot be determined neurovascular exam was intact skin was normal  MEDICAL DECISION SECTION:  Encounter Diagnosis  Name Primary?  . Colles' fracture of left radius, initial encounter for closed fracture Yes    Imaging The hospital has 4 images for me to look at the first 1 is a lateral view it shows significant dorsal tilt of the distal radius Colles' type fracture With severe shortening loss of radial inclination there may be an ulnar styloid fracture   Plan:  (Rx., Inj., surg., Frx, MRI/CT, XR:19)  85 year old female opts for surgical treatment of left wrist fracture  Recommend open treatment internal fixation volar wrist plate left wrist  The procedure has been fully reviewed with the patient; The risks and benefits of surgery have been discussed and explained and understood. Alternative treatment has also been reviewed, questions were encouraged and answered. The postoperative plan is also been reviewed.   10:56 AM Paige Abbott, MD  06/30/2019

## 2019-07-01 ENCOUNTER — Ambulatory Visit (HOSPITAL_COMMUNITY)
Admission: RE | Admit: 2019-07-01 | Discharge: 2019-07-01 | Disposition: A | Discharge status: Home / Self Care | Payer: Medicare Other | Attending: Orthopedic Surgery | Admitting: Orthopedic Surgery

## 2019-07-01 ENCOUNTER — Ambulatory Visit (HOSPITAL_COMMUNITY): Payer: Medicare Other | Admitting: Anesthesiology

## 2019-07-01 ENCOUNTER — Encounter (HOSPITAL_COMMUNITY)
Admission: RE | Disposition: A | Discharge status: Home / Self Care | Payer: Self-pay | Source: Home / Self Care | Attending: Orthopedic Surgery

## 2019-07-01 ENCOUNTER — Encounter (HOSPITAL_COMMUNITY): Payer: Self-pay | Admitting: Anesthesiology

## 2019-07-01 ENCOUNTER — Ambulatory Visit (HOSPITAL_COMMUNITY): Payer: Medicare Other

## 2019-07-01 ENCOUNTER — Ambulatory Visit (HOSPITAL_COMMUNITY): Payer: Medicare Other | Admitting: Hematology

## 2019-07-01 DIAGNOSIS — Z7983 Long term (current) use of bisphosphonates: Secondary | ICD-10-CM | POA: Diagnosis not present

## 2019-07-01 DIAGNOSIS — Z87891 Personal history of nicotine dependence: Secondary | ICD-10-CM | POA: Insufficient documentation

## 2019-07-01 DIAGNOSIS — S52532A Colles' fracture of left radius, initial encounter for closed fracture: Secondary | ICD-10-CM

## 2019-07-01 DIAGNOSIS — E785 Hyperlipidemia, unspecified: Secondary | ICD-10-CM | POA: Insufficient documentation

## 2019-07-01 DIAGNOSIS — Z96649 Presence of unspecified artificial hip joint: Secondary | ICD-10-CM | POA: Diagnosis not present

## 2019-07-01 DIAGNOSIS — S52502D Unspecified fracture of the lower end of left radius, subsequent encounter for closed fracture with routine healing: Secondary | ICD-10-CM | POA: Diagnosis not present

## 2019-07-01 DIAGNOSIS — Z79890 Hormone replacement therapy (postmenopausal): Secondary | ICD-10-CM | POA: Insufficient documentation

## 2019-07-01 DIAGNOSIS — K219 Gastro-esophageal reflux disease without esophagitis: Secondary | ICD-10-CM | POA: Diagnosis not present

## 2019-07-01 DIAGNOSIS — W010XXA Fall on same level from slipping, tripping and stumbling without subsequent striking against object, initial encounter: Secondary | ICD-10-CM | POA: Insufficient documentation

## 2019-07-01 DIAGNOSIS — Z853 Personal history of malignant neoplasm of breast: Secondary | ICD-10-CM | POA: Diagnosis not present

## 2019-07-01 DIAGNOSIS — E1122 Type 2 diabetes mellitus with diabetic chronic kidney disease: Secondary | ICD-10-CM | POA: Insufficient documentation

## 2019-07-01 DIAGNOSIS — N182 Chronic kidney disease, stage 2 (mild): Secondary | ICD-10-CM | POA: Insufficient documentation

## 2019-07-01 DIAGNOSIS — Z9981 Dependence on supplemental oxygen: Secondary | ICD-10-CM | POA: Insufficient documentation

## 2019-07-01 DIAGNOSIS — S52572A Other intraarticular fracture of lower end of left radius, initial encounter for closed fracture: Secondary | ICD-10-CM | POA: Insufficient documentation

## 2019-07-01 DIAGNOSIS — E039 Hypothyroidism, unspecified: Secondary | ICD-10-CM | POA: Insufficient documentation

## 2019-07-01 DIAGNOSIS — J449 Chronic obstructive pulmonary disease, unspecified: Secondary | ICD-10-CM | POA: Insufficient documentation

## 2019-07-01 DIAGNOSIS — Z79899 Other long term (current) drug therapy: Secondary | ICD-10-CM | POA: Diagnosis not present

## 2019-07-01 DIAGNOSIS — I129 Hypertensive chronic kidney disease with stage 1 through stage 4 chronic kidney disease, or unspecified chronic kidney disease: Secondary | ICD-10-CM | POA: Insufficient documentation

## 2019-07-01 DIAGNOSIS — Z8781 Personal history of (healed) traumatic fracture: Secondary | ICD-10-CM

## 2019-07-01 DIAGNOSIS — Z7982 Long term (current) use of aspirin: Secondary | ICD-10-CM | POA: Diagnosis not present

## 2019-07-01 DIAGNOSIS — Z9889 Other specified postprocedural states: Secondary | ICD-10-CM

## 2019-07-01 HISTORY — PX: OPEN REDUCTION INTERNAL FIXATION (ORIF) DISTAL RADIAL FRACTURE: SHX5989

## 2019-07-01 LAB — GLUCOSE, CAPILLARY: Glucose-Capillary: 121 mg/dL — ABNORMAL HIGH (ref 70–99)

## 2019-07-01 SURGERY — OPEN REDUCTION INTERNAL FIXATION (ORIF) DISTAL RADIUS FRACTURE
Anesthesia: Regional | Site: Wrist | Laterality: Left

## 2019-07-01 MED ORDER — ROPIVACAINE HCL 5 MG/ML IJ SOLN
INTRAMUSCULAR | Status: AC
  Filled 2019-07-01: qty 30

## 2019-07-01 MED ORDER — LACTATED RINGERS IV SOLN
INTRAVENOUS | Status: DC | PRN
  Administered 2019-07-01: 12:00:00 via INTRAVENOUS

## 2019-07-01 MED ORDER — IPRATROPIUM-ALBUTEROL 0.5-2.5 (3) MG/3ML IN SOLN
3.0000 mL | Freq: Once | RESPIRATORY_TRACT | Status: AC
  Administered 2019-07-01: 3 mL via RESPIRATORY_TRACT

## 2019-07-01 MED ORDER — BUPIVACAINE-EPINEPHRINE (PF) 0.5% -1:200000 IJ SOLN
INTRAMUSCULAR | Status: AC
  Filled 2019-07-01: qty 30

## 2019-07-01 MED ORDER — PROPOFOL 500 MG/50ML IV EMUL
INTRAVENOUS | Status: DC | PRN
  Administered 2019-07-01: 25 ug/kg/min via INTRAVENOUS
  Administered 2019-07-01: 13:00:00 via INTRAVENOUS

## 2019-07-01 MED ORDER — KETAMINE HCL 10 MG/ML IJ SOLN
INTRAMUSCULAR | Status: DC | PRN
  Administered 2019-07-01: 10 mg via INTRAVENOUS
  Administered 2019-07-01: 5 mg via INTRAVENOUS

## 2019-07-01 MED ORDER — FENTANYL CITRATE (PF) 250 MCG/5ML IJ SOLN
INTRAMUSCULAR | Status: AC
  Filled 2019-07-01: qty 5

## 2019-07-01 MED ORDER — ONDANSETRON HCL 4 MG/2ML IJ SOLN: 4.0000 mg | Freq: Once | INTRAMUSCULAR | Status: DC | PRN

## 2019-07-01 MED ORDER — FENTANYL CITRATE (PF) 100 MCG/2ML IJ SOLN
INTRAMUSCULAR | Status: DC | PRN
  Administered 2019-07-01: 25 ug via INTRAVENOUS

## 2019-07-01 MED ORDER — FENTANYL CITRATE (PF) 100 MCG/2ML IJ SOLN
INTRAMUSCULAR | Status: AC
  Filled 2019-07-01: qty 2

## 2019-07-01 MED ORDER — 0.9 % SODIUM CHLORIDE (POUR BTL) OPTIME
TOPICAL | Status: DC | PRN
  Administered 2019-07-01: 1000 mL

## 2019-07-01 MED ORDER — LIDOCAINE HCL (PF) 1 % IJ SOLN
INTRAMUSCULAR | Status: DC | PRN
  Administered 2019-07-01: 3 mL

## 2019-07-01 MED ORDER — CHLORHEXIDINE GLUCONATE 4 % EX LIQD: 60.0000 mL | Freq: Once | CUTANEOUS | Status: DC

## 2019-07-01 MED ORDER — DEXAMETHASONE SODIUM PHOSPHATE 4 MG/ML IJ SOLN
INTRAMUSCULAR | Status: DC | PRN
  Administered 2019-07-01: 4 mg via PERINEURAL

## 2019-07-01 MED ORDER — FENTANYL CITRATE (PF) 100 MCG/2ML IJ SOLN
25.0000 ug | INTRAMUSCULAR | Status: DC | PRN
  Administered 2019-07-01: 11:00:00 25 ug via INTRAVENOUS

## 2019-07-01 MED ORDER — LIDOCAINE HCL (PF) 1 % IJ SOLN
INTRAMUSCULAR | Status: AC
  Filled 2019-07-01: qty 30

## 2019-07-01 MED ORDER — MIDAZOLAM HCL 2 MG/2ML IJ SOLN
0.5000 mg | Freq: Once | INTRAMUSCULAR | Status: AC
  Administered 2019-07-01: 0.5 mg via INTRAVENOUS

## 2019-07-01 MED ORDER — VANCOMYCIN HCL IN DEXTROSE 1-5 GM/200ML-% IV SOLN
1000.0000 mg | INTRAVENOUS | Status: AC
  Administered 2019-07-01: 1000 mg via INTRAVENOUS

## 2019-07-01 MED ORDER — LACTATED RINGERS IV SOLN
Freq: Once | INTRAVENOUS | Status: AC
  Administered 2019-07-01: 10:00:00 via INTRAVENOUS

## 2019-07-01 MED ORDER — ACETAMINOPHEN-CODEINE #3 300-30 MG PO TABS: 1.0000 | ORAL_TABLET | ORAL | 0 refills | Status: AC | PRN

## 2019-07-01 MED ORDER — KETAMINE HCL 50 MG/5ML IJ SOSY
PREFILLED_SYRINGE | INTRAMUSCULAR | Status: AC
  Filled 2019-07-01: qty 5

## 2019-07-01 MED ORDER — DEXAMETHASONE SODIUM PHOSPHATE 4 MG/ML IJ SOLN
INTRAMUSCULAR | Status: AC
  Filled 2019-07-01: qty 1

## 2019-07-01 MED ORDER — PROPOFOL 10 MG/ML IV BOLUS
INTRAVENOUS | Status: AC
  Filled 2019-07-01: qty 40

## 2019-07-01 MED ORDER — MIDAZOLAM HCL 2 MG/2ML IJ SOLN
INTRAMUSCULAR | Status: AC
  Filled 2019-07-01: qty 2

## 2019-07-01 MED ORDER — SODIUM CHLORIDE 0.9 % IV SOLN
INTRAVENOUS | Status: DC | PRN
  Administered 2019-07-01: 12:00:00 via INTRAVENOUS

## 2019-07-01 MED ORDER — ROPIVACAINE HCL 5 MG/ML IJ SOLN
INTRAMUSCULAR | Status: DC | PRN
  Administered 2019-07-01: 2.5 mL via PERINEURAL
  Administered 2019-07-01: 16 mL via PERINEURAL

## 2019-07-01 MED ORDER — IPRATROPIUM-ALBUTEROL 0.5-2.5 (3) MG/3ML IN SOLN
RESPIRATORY_TRACT | Status: AC
  Filled 2019-07-01: qty 3

## 2019-07-01 MED ORDER — VANCOMYCIN HCL IN DEXTROSE 1-5 GM/200ML-% IV SOLN
INTRAVENOUS | Status: AC
  Filled 2019-07-01: qty 200

## 2019-07-01 SURGICAL SUPPLY — 58 items
APL PRP STRL LF DISP 70% ISPRP (MISCELLANEOUS) ×1
APL SKNCLS STERI-STRIP NONHPOA (GAUZE/BANDAGES/DRESSINGS) ×1
BANDAGE ESMARK 4X12 BL STRL LF (DISPOSABLE) ×1 IMPLANT
BENZOIN TINCTURE PRP APPL 2/3 (GAUZE/BANDAGES/DRESSINGS) ×2 IMPLANT
BNDG CMPR 12X4 ELC STRL LF (DISPOSABLE) ×1
BNDG CMPR STD VLCR NS LF 5.8X3 (GAUZE/BANDAGES/DRESSINGS) ×1
BNDG COHESIVE 4X5 TAN STRL (GAUZE/BANDAGES/DRESSINGS) ×3 IMPLANT
BNDG ELASTIC 3X5.8 VLCR NS LF (GAUZE/BANDAGES/DRESSINGS) ×2 IMPLANT
BNDG ESMARK 4X12 BLUE STRL LF (DISPOSABLE) ×3
CHLORAPREP W/TINT 26 (MISCELLANEOUS) ×3 IMPLANT
CLOSURE WOUND 1/2 X4 (GAUZE/BANDAGES/DRESSINGS) ×1
CLOTH BEACON ORANGE TIMEOUT ST (SAFETY) ×3 IMPLANT
COVER LIGHT HANDLE STERIS (MISCELLANEOUS) ×6 IMPLANT
COVER PROBE W GEL 5X96 (DRAPES) ×3 IMPLANT
COVER WAND RF STERILE (DRAPES) ×2 IMPLANT
CUFF TOURN SGL QUICK 18X4 (TOURNIQUET CUFF) ×3 IMPLANT
DRAPE C-ARM FOLDED MOBILE STRL (DRAPES) ×3 IMPLANT
DRSG XEROFORM 1X8 (GAUZE/BANDAGES/DRESSINGS) ×2 IMPLANT
ELECT REM PT RETURN 9FT ADLT (ELECTROSURGICAL) ×3
ELECTRODE REM PT RTRN 9FT ADLT (ELECTROSURGICAL) ×1 IMPLANT
GAUZE SPONGE 4X4 12PLY STRL (GAUZE/BANDAGES/DRESSINGS) ×3 IMPLANT
GLOVE BIO SURGEON STRL SZ7 (GLOVE) ×2 IMPLANT
GLOVE BIOGEL PI IND STRL 7.0 (GLOVE) ×1 IMPLANT
GLOVE BIOGEL PI INDICATOR 7.0 (GLOVE) ×8
GLOVE ECLIPSE 6.5 STRL STRAW (GLOVE) ×6 IMPLANT
GLOVE SKINSENSE NS SZ8.0 LF (GLOVE) ×2
GLOVE SKINSENSE STRL SZ8.0 LF (GLOVE) ×1 IMPLANT
GLOVE SS N UNI LF 8.5 STRL (GLOVE) ×3 IMPLANT
GOWN STRL REUS W/ TWL LRG LVL3 (GOWN DISPOSABLE) IMPLANT
GOWN STRL REUS W/TWL LRG LVL3 (GOWN DISPOSABLE) ×11 IMPLANT
GOWN STRL REUS W/TWL XL LVL3 (GOWN DISPOSABLE) ×3 IMPLANT
KIT TURNOVER KIT A (KITS) ×3 IMPLANT
MANIFOLD NEPTUNE II (INSTRUMENTS) ×3 IMPLANT
NDL HYPO 21X1.5 SAFETY (NEEDLE) ×1 IMPLANT
NEEDLE HYPO 21X1.5 SAFETY (NEEDLE) ×3 IMPLANT
NS IRRIG 1000ML POUR BTL (IV SOLUTION) ×3 IMPLANT
PACK BASIC LIMB (CUSTOM PROCEDURE TRAY) ×3 IMPLANT
PAD ARMBOARD 7.5X6 YLW CONV (MISCELLANEOUS) ×3 IMPLANT
PADDING WEBRIL 3 STERILE (GAUZE/BANDAGES/DRESSINGS) ×2 IMPLANT
PEG SUBCHONDRAL SMOOTH 2.0X18 (Peg) ×2 IMPLANT
PEG SUBCHONDRAL SMOOTH 2.0X20 (Peg) ×2 IMPLANT
PEG SUBCHONDRAL SMOOTH 2.0X22 (Peg) ×2 IMPLANT
PEG THREADED 2.5MMX20MM LONG (Peg) ×4 IMPLANT
PEG THREADED 2.5MMX22MM LONG (Peg) ×2 IMPLANT
PLATE SHORT 24.4X51.3 LT (Plate) ×2 IMPLANT
SCREW BN 12X3.5XNS CORT TI (Screw) IMPLANT
SCREW CORT 3.5X12 (Screw) ×9 IMPLANT
SCREW MULTI DIRECT 16MM (Screw) ×2 IMPLANT
SCREW MULTI DIRECT 20MM (Screw) ×2 IMPLANT
SET BASIN LINEN APH (SET/KITS/TRAYS/PACK) ×3 IMPLANT
SPLINT IMMOBILIZER J 3INX20FT (CAST SUPPLIES) ×2
SPLINT J IMMOBILIZER 3X20FT (CAST SUPPLIES) ×1 IMPLANT
STAPLER VISISTAT 35W (STAPLE) ×3 IMPLANT
STRIP CLOSURE SKIN 1/2X4 (GAUZE/BANDAGES/DRESSINGS) ×1 IMPLANT
SUT MON AB 2-0 SH 27 (SUTURE) ×3
SUT MON AB 2-0 SH27 (SUTURE) ×1 IMPLANT
SYR 10ML LL (SYRINGE) ×3 IMPLANT
WATER STERILE IRR 1000ML POUR (IV SOLUTION) ×3 IMPLANT

## 2019-07-01 NOTE — Brief Op Note (Signed)
07/01/2019  1:22 PM  PATIENT:  Paige Rhodes  85 y.o. female  PRE-OPERATIVE DIAGNOSIS:  left wrist fracture, intra-articular  POST-OPERATIVE DIAGNOSIS:  left wrist fracture, intra-articular  PROCEDURE:  Procedure(s): OPEN REDUCTION INTERNAL FIXATION (ORIF) LEFT DISTAL RADIAL FRACTURE (Left) - 0000000 Splint APPLICATION (Left)   Implant DVR volar plate with 3 cortical screws and 6 distal pegs with 1 multidirectional screw  Findings Distal radius fracture with dorsal angulation, three-part with intra-articular extension and metaphyseal comminution   SURGEON:  Surgeon(s) and Role:    Carole Civil, MD - Primary  The patient was seen in the preop area and the surgical site was confirmed the chart was reviewed and updated and the surgical site was marked left wrist  Patient was taken to the operating room after infraclavicular and radial nerve block and was given IV sedation  The left arm was prepped and draped sterilely  After timeout was completed a provisional reduction was performed with traction countertraction and reversal of deformity  Radiographic images confirmed improvement of the fracture alignment  The fracture was manipulated further into volar angulation and a repeat radiograph confirmed near anatomic reduction  The limb was exsanguinated with a 4 inch Esmarch the tourniquet was elevated 250 mmHg  A volar incision was made over the flexor carpi radialis tendon subcutaneous tissue was divided tendon was identified in the tendon sheath was opened the flexor carpi radialis tendon was retracted radially the flexor muscles of the forearm were retracted ulnarly.  The pronator quadratus was incised in an L fashion and retracted ulnarly.  The fracture was found to be intra-articular and to be in 3 parts with a radial fragment and then an intra-articular vertical extension of the lunate fragment  There was a rectangular plate on the volar surface which was also  fractured but was in excellent key to reduction  The volar plate was placed and with dorsal force the radial tilt was restored.  Cortical screw was placed in the slotted hole and checked on x-ray  We then placed the ulnar PEG with a smooth PEG and checked x-ray with the lunate fossa lateral and found the peg to be in the subchondral bone we continued in the radial direction with the proximal pegs using threaded pegs for the last 2 pegs on the ulnar row to lag the plate to the bone.  We then started ulnarly again and placed the remaining pegs using a combination of smooth and threaded pegs.  Images confirmed adequate placement of the screws and reduction  We then placed a multidirectional screw.  Radiographs confirm that it was too long so we took it out and placed a 16 mm instead of a 20 mm multidirectional screw and placed it in a different direction more ulnarly to capture the styloid fragment  The wounds were irrigated and closed with 2-0 running Monocryl benzoin and Steri-Strips  After the dressing was placed a volar splint was applied  The patient was taken to the recovery room in stable condition  Assisted by Corrie Dandy   ANESTHESIA:   IV sedation with propofol and infraclavicular block and radial nerve block  EBL:  5 mL   BLOOD ADMINISTERED:none  DRAINS: none   LOCAL MEDICATIONS USED:  MARCAINE     SPECIMEN:  No Specimen  DISPOSITION OF SPECIMEN:  N/A  COUNTS:  YES  TOURNIQUET:   Total Tourniquet Time Documented: Upper Arm (Left) - 64 minutes Total: Upper Arm (Left) - 64 minutes   DICTATION: .  Scientist, forensic OF CARE: Discharge to home after PACU  PATIENT DISPOSITION:  PACU - hemodynamically stable.   Delay start of Pharmacological VTE agent (>24hrs) due to surgical blood loss or risk of bleeding: not applicable

## 2019-07-01 NOTE — Discharge Instructions (Signed)
General Anesthesia, Adult, Care After °This sheet gives you information about how to care for yourself after your procedure. Your health care provider may also give you more specific instructions. If you have problems or questions, contact your health care provider. °What can I expect after the procedure? °After the procedure, the following side effects are common: °· Pain or discomfort at the IV site. °· Nausea. °· Vomiting. °· Sore throat. °· Trouble concentrating. °· Feeling cold or chills. °· Weak or tired. °· Sleepiness and fatigue. °· Soreness and body aches. These side effects can affect parts of the body that were not involved in surgery. °Follow these instructions at home: ° °For at least 24 hours after the procedure: °· Have a responsible adult stay with you. It is important to have someone help care for you until you are awake and alert. °· Rest as needed. °· Do not: °? Participate in activities in which you could fall or become injured. °? Drive. °? Use heavy machinery. °? Drink alcohol. °? Take sleeping pills or medicines that cause drowsiness. °? Make important decisions or sign legal documents. °? Take care of children on your own. °Eating and drinking °· Follow any instructions from your health care provider about eating or drinking restrictions. °· When you feel hungry, start by eating small amounts of foods that are soft and easy to digest (bland), such as toast. Gradually return to your regular diet. °· Drink enough fluid to keep your urine pale yellow. °· If you vomit, rehydrate by drinking water, juice, or clear broth. °General instructions °· If you have sleep apnea, surgery and certain medicines can increase your risk for breathing problems. Follow instructions from your health care provider about wearing your sleep device: °? Anytime you are sleeping, including during daytime naps. °? While taking prescription pain medicines, sleeping medicines, or medicines that make you drowsy. °· Return to  your normal activities as told by your health care provider. Ask your health care provider what activities are safe for you. °· Take over-the-counter and prescription medicines only as told by your health care provider. °· If you smoke, do not smoke without supervision. °· Keep all follow-up visits as told by your health care provider. This is important. °Contact a health care provider if: °· You have nausea or vomiting that does not get better with medicine. °· You cannot eat or drink without vomiting. °· You have pain that does not get better with medicine. °· You are unable to pass urine. °· You develop a skin rash. °· You have a fever. °· You have redness around your IV site that gets worse. °Get help right away if: °· You have difficulty breathing. °· You have chest pain. °· You have blood in your urine or stool, or you vomit blood. °Summary °· After the procedure, it is common to have a sore throat or nausea. It is also common to feel tired. °· Have a responsible adult stay with you for the first 24 hours after general anesthesia. It is important to have someone help care for you until you are awake and alert. °· When you feel hungry, start by eating small amounts of foods that are soft and easy to digest (bland), such as toast. Gradually return to your regular diet. °· Drink enough fluid to keep your urine pale yellow. °· Return to your normal activities as told by your health care provider. Ask your health care provider what activities are safe for you. °This information is not   intended to replace advice given to you by your health care provider. Make sure you discuss any questions you have with your health care provider. Document Released: 11/25/2000 Document Revised: 08/22/2017 Document Reviewed: 04/04/2017 Elsevier Patient Education  [place]. Wrist Fracture Treated With ORIF A wrist fracture is a break or crack in one of the bones of the wrist. The wrist is made up of eight small bones at  the palm of the hand (carpal bones) and two long bones that make up the forearm (radius and ulna). If the fracture is displaced, it means that one or more parts of a bone have been moved out of their normal position and the bones are not lined up correctly. Open reduction with internal fixation (ORIF) is a surgical procedure that is used to treat a displaced wrist fracture. In this procedure, a surgeon will put the bone pieces back together. The surgeon will put in plates, screws, or other types of hardware to hold the bones in place. The procedure helps the bones heal properly. Tell a health care provider about:  Any allergies you have.  All medicines you are taking, including vitamins, herbs, eye drops, creams, and over-the-counter medicines.  Any problems you or family members have had with anesthetic medicines.  Any blood disorders you have.  Any surgeries you have had.  Any medical conditions you have.  Whether you are pregnant or may be pregnant. What are the risks? Generally, this is a safe procedure. However, problems may occur, including:  Infection.  Bleeding.  Failure of the bone to heal.  Wrist stiffness.  Damage to other structures.  Allergic reactions to medicines. What happens before the procedure? Staying hydrated Follow instructions from your health care provider about hydration, which may include:  Up to 2 hours before the procedure - you may continue to drink clear liquids, such as water, clear fruit juice, black coffee, and plain tea.  Eating and drinking restrictions Follow instructions from your health care provider about eating and drinking, which may include:  8 hours before the procedure - stop eating heavy meals or foods, such as meat, fried foods, or fatty foods.  6 hours before the procedure - stop eating light meals or foods, such as toast or cereal.  6 hours before the procedure - stop drinking milk or drinks that contain milk.  2 hours  before the procedure - stop drinking clear liquids. Medicines Ask your health care provider about:  Changing or stopping your regular medicines. This is especially important if you are taking diabetes medicines or blood thinners.  Taking medicines such as aspirin and ibuprofen. These medicines can thin your blood. Do not take these medicines unless your health care provider tells you to take them.  Taking over-the-counter medicines, vitamins, herbs, and supplements. General instructions  Plan to have someone take you home from the hospital or clinic.  Plan to have a responsible adult care for you for at least 24 hours after you leave the hospital or clinic. This is important.  Ask your health care provider what steps will be taken to help prevent infection. These may include: ? Removing hair at the surgery site. ? Washing skin with a germ-killing soap. ? Taking antibiotic medicine. What happens during the procedure?   An IV will be inserted into one of your veins.  You will be given one or more of the following: ? A medicine to help you relax (sedative). ? A medicine to numb the area (local anesthetic). ?  A medicine to make you fall asleep (general anesthetic). ? A medicine that is injected into an area of your body to numb everything below the injection site (regional anesthetic).  The surgeon will make an incision through your skin over the area of the fracture.  The broken bones will be returned to their normal positions. To hold the bones in place, the surgeon will use screws and a metal plate or different types of wiring.  The surgeon will close the incision with stitches (sutures) or staples.  A bandage (dressing) will be placed over your incision.  A splint or cast may be placed over your dressing. The procedure may vary among health care providers and hospitals. What happens after the procedure?  Your blood pressure, heart rate, breathing rate, and blood oxygen level  will be monitored until you leave the hospital or clinic.  You will be given medicine as needed for pain.  Your wrist will be placed on pillows to keep it raised (elevated). This will help prevent swelling.  You may have physical therapy while you are in the hospital.  You may be sent home with a sling to support your arm.  Do not drive until your health care provider approves. Summary  If a wrist fracture is displaced, it means that one or more parts of a bone have been moved out of their normal position and the bones are not lined up correctly.  A displaced wrist fracture will be treated with a surgical procedure that is called open reduction with internal fixation (ORIF).  The bone pieces are put back together and held in place by plates, screws, or other types of hardware.  Follow instructions from your health care provider about eating and drinking before the procedure. This information is not intended to replace advice given to you by your health care provider. Make sure you discuss any questions you have with your health care provider. Document Released: 07/31/2015 Document Revised: 01/06/2018 Document Reviewed: 01/06/2018 Elsevier Patient Education  2020 [employer].

## 2019-07-01 NOTE — Anesthesia Postprocedure Evaluation (Signed)
Anesthesia Post Note  Patient: Paige Rhodes  Procedure(s) Performed: OPEN REDUCTION INTERNAL FIXATION (ORIF) LEFT DISTAL RADIAL FRACTURE (Left Wrist)  Patient location during evaluation: PACU Anesthesia Type: Regional Level of consciousness: awake, oriented and patient cooperative Pain management: pain level controlled Vital Signs Assessment: post-procedure vital signs reviewed and stable Respiratory status: spontaneous breathing Cardiovascular status: stable Postop Assessment: no apparent nausea or vomiting Anesthetic complications: no     Last Vitals:  Vitals:   07/01/19 0959  Pulse: (!) 55  Resp: 16  Temp: 36.7 C  SpO2: 98%    Last Pain:  Vitals:   07/01/19 1000  TempSrc:   PainSc: 0-No pain                 Amsi Grimley A

## 2019-07-01 NOTE — Anesthesia Preprocedure Evaluation (Signed)
Anesthesia Evaluation  Patient identified by MRN, date of birth, ID band Patient awake    Reviewed: Allergy & Precautions, NPO status , Patient's Chart, lab work & pertinent test results, reviewed documented beta blocker date and time   Airway        Dental   Pulmonary shortness of breath, with exertion and Long-Term Oxygen Therapy, pneumonia, resolved, COPD,  oxygen dependent, former smoker,    Pulmonary exam normal        Cardiovascular Exercise Tolerance: Poor hypertension, Pt. on medications and Pt. on home beta blockers Normal cardiovascular exam  Left ventricle: The cavity size was normal. Wall thickness was   increased in a pattern of mild LVH. Systolic function was normal.   The estimated ejection fraction was in the range of 60% to 65%.   Diastolic function is abnormal, indeterminant. Wall motion was   normal; there were no regional wall motion abnormalities. - Aortic valve: Valve area (VTI): 2.84 cm^2. Valve area (Vmax):   2.81 cm^2. Valve area (Vmean): 2.79 cm^2. - Technically difficult study. Echocontrast was used to Scientist, forensic. EKG- Sinus brady   Neuro/Psych negative psych ROS   GI/Hepatic GERD  Medicated and Controlled,  Endo/Other  diabetes, Type 2Hypothyroidism   Renal/GU Renal disease     Musculoskeletal negative musculoskeletal ROS (+)   Abdominal Normal abdominal exam  (+)   Peds  Hematology  (+) anemia ,   Anesthesia Other Findings   Reproductive/Obstetrics negative OB ROS                             Anesthesia Physical Anesthesia Plan  ASA: IV  Anesthesia Plan: Regional and General   Post-op Pain Management:  Regional for Post-op pain   Induction:   PONV Risk Score and Plan: Ondansetron  Airway Management Planned: Natural Airway, Nasal Cannula, LMA and Simple Face Mask  Additional Equipment:   Intra-op Plan:   Post-operative Plan:    Informed Consent: I have reviewed the patients History and Physical, chart, labs and discussed the procedure including the risks, benefits and alternatives for the proposed anesthesia with the patient or authorized representative who has indicated his/her understanding and acceptance.     Dental advisory given  Plan Discussed with: CRNA  Anesthesia Plan Comments: (Regional anesthesia and sedation/GA with LMA, Discussed with patient regarding possible risks and benefits with regional anesthesia.)        Anesthesia Quick Evaluation

## 2019-07-01 NOTE — Anesthesia Procedure Notes (Addendum)
Anesthesia Regional Block: Infraclavicular brachial plexus block (left radial nerve block at anticubital fossa)  Laterality: Left  Prep: chloraprep       Needles:   Needle Type: Echogenic Stimulator Needle     Needle Length: 10cm  Needle Gauge: 20   Needle insertion depth: 7 cm   Additional Needles:   Procedures:,,,, ultrasound used (permanent image in chart),,,,  Narrative:  Start time: 07/01/2019 10:50 AM End time: 07/01/2019 11:20 AM  Performed by: Personally  Anesthesiologist: Denese Killings, MD  Additional Notes: Ropivacaine 0.5% 16 ml and 7 ml of 1% lidocaine and dexamethasone 4 mg (total volume of 24 ml was used). Left radial nerve block(11:24 - 11:27am) at anticubital fossa with US guidance 5 cm echogenic stimulator needle was used and mixture of  3 ml of ropivacaine 0.5 % and 3 ml of lidocaine 1% was used.

## 2019-07-01 NOTE — Interval H&P Note (Signed)
History and Physical Interval Note:  07/01/2019 11:35 AM  Paige Rhodes  has presented today for surgery, with the diagnosis of left wrist fracture.  The various methods of treatment have been discussed with the patient and family. After consideration of risks, benefits and other options for treatment, the patient has consented to  Procedure(s): OPEN REDUCTION INTERNAL FIXATION (ORIF) LEFT DISTAL RADIAL FRACTURE (Left) as a surgical intervention.  The patient's history has been reviewed, patient examined, no change in status, stable for surgery.  I have reviewed the patient's chart and labs.  Questions were answered to the patient's satisfaction.     Arther Abbott

## 2019-07-01 NOTE — Anesthesia Procedure Notes (Signed)
Procedure Name: MAC Performed by: Adams, Amy A, CRNA Pre-anesthesia Checklist: Patient identified, Emergency Drugs available, Suction available, Timeout performed and Patient being monitored Patient Re-evaluated:Patient Re-evaluated prior to induction Oxygen Delivery Method: Non-rebreather mask       

## 2019-07-01 NOTE — Op Note (Signed)
07/01/2019  1:22 PM  PATIENT:  Paige Rhodes  85 y.o. female  PRE-OPERATIVE DIAGNOSIS:  left wrist fracture, intra-articular  POST-OPERATIVE DIAGNOSIS:  left wrist fracture, intra-articular  PROCEDURE:  Procedure(s): OPEN REDUCTION INTERNAL FIXATION (ORIF) LEFT DISTAL RADIAL FRACTURE (Left) - 0000000 Splint APPLICATION (Left)   Implant DVR volar plate with 3 cortical screws and 6 distal pegs with 1 multidirectional screw  Findings Distal radius fracture with dorsal angulation, three-part with intra-articular extension and metaphyseal comminution   SURGEON:  Surgeon(s) and Role:    Carole Civil, MD - Primary  The patient was seen in the preop area and the surgical site was confirmed the chart was reviewed and updated and the surgical site was marked left wrist  Patient was taken to the operating room after infraclavicular and radial nerve block and was given IV sedation  The left arm was prepped and draped sterilely  After timeout was completed a provisional reduction was performed with traction countertraction and reversal of deformity  Radiographic images confirmed improvement of the fracture alignment  The fracture was manipulated further into volar angulation and a repeat radiograph confirmed near anatomic reduction  The limb was exsanguinated with a 4 inch Esmarch the tourniquet was elevated 250 mmHg  A volar incision was made over the flexor carpi radialis tendon subcutaneous tissue was divided tendon was identified in the tendon sheath was opened the flexor carpi radialis tendon was retracted radially the flexor muscles of the forearm were retracted ulnarly.  The pronator quadratus was incised in an L fashion and retracted ulnarly.  The fracture was found to be intra-articular and to be in 3 parts with a radial fragment and then an intra-articular vertical extension of the lunate fragment  There was a rectangular plate on the volar surface which was also  fractured but was in excellent key to reduction  The volar plate was placed and with dorsal force the radial tilt was restored.  Cortical screw was placed in the slotted hole and checked on x-ray  We then placed the ulnar PEG with a smooth PEG and checked x-ray with the lunate fossa lateral and found the peg to be in the subchondral bone we continued in the radial direction with the proximal pegs using threaded pegs for the last 2 pegs on the ulnar row to lag the plate to the bone.  We then started ulnarly again and placed the remaining pegs using a combination of smooth and threaded pegs.  Images confirmed adequate placement of the screws and reduction  We then placed a multidirectional screw.  Radiographs confirm that it was too long so we took it out and placed a 16 mm instead of a 20 mm multidirectional screw and placed it in a different direction more ulnarly to capture the styloid fragment  The wounds were irrigated and closed with 2-0 running Monocryl benzoin and Steri-Strips  After the dressing was placed a volar splint was applied  The patient was taken to the recovery room in stable condition  Assisted by Corrie Dandy   ANESTHESIA:   IV sedation with propofol and infraclavicular block and radial nerve block  EBL:  5 mL   BLOOD ADMINISTERED:none  DRAINS: none   LOCAL MEDICATIONS USED:  MARCAINE     SPECIMEN:  No Specimen  DISPOSITION OF SPECIMEN:  N/A  COUNTS:  YES  TOURNIQUET:   Total Tourniquet Time Documented: Upper Arm (Left) - 64 minutes Total: Upper Arm (Left) - 64 minutes   DICTATION: .  Scientist, forensic OF CARE: Discharge to home after PACU  PATIENT DISPOSITION:  PACU - hemodynamically stable.   Delay start of Pharmacological VTE agent (>24hrs) due to surgical blood loss or risk of bleeding: not applicable

## 2019-07-01 NOTE — Transfer of Care (Signed)
Immediate Anesthesia Transfer of Care Note  Patient: Paige Rhodes  Procedure(s) Performed: OPEN REDUCTION INTERNAL FIXATION (ORIF) LEFT DISTAL RADIAL FRACTURE (Left ) CAST APPLICATION (Left )  Patient Location: PACU  Anesthesia Type:MAC and Regional  Level of Consciousness: awake, oriented and patient cooperative  Airway & Oxygen Therapy: Patient Spontanous Breathing and Patient connected to nasal cannula oxygen  Post-op Assessment: Report given to RN and Post -op Vital signs reviewed and stable  Post vital signs: Reviewed and stable  Last Vitals:  Vitals Value Taken Time  BP 128/74 07/01/19 1322  Temp    Pulse 70 07/01/19 1325  Resp 16 07/01/19 1325  SpO2 89 % 07/01/19 1325  Vitals shown include unvalidated device data.  Last Pain:  Vitals:   07/01/19 1000  TempSrc:   PainSc: 0-No pain      Patients Stated Pain Goal: 6 (32/44/01 0272)  Complications: No apparent anesthesia complications

## 2019-07-02 ENCOUNTER — Ambulatory Visit (INDEPENDENT_AMBULATORY_CARE_PROVIDER_SITE_OTHER): Payer: Medicare Other | Admitting: Orthopedic Surgery

## 2019-07-02 ENCOUNTER — Encounter: Payer: Self-pay | Admitting: Orthopedic Surgery

## 2019-07-02 ENCOUNTER — Other Ambulatory Visit: Payer: Self-pay

## 2019-07-02 ENCOUNTER — Ambulatory Visit (HOSPITAL_COMMUNITY): Payer: Medicare Other | Admitting: Hematology

## 2019-07-02 DIAGNOSIS — Z8781 Personal history of (healed) traumatic fracture: Secondary | ICD-10-CM

## 2019-07-02 DIAGNOSIS — S52532A Colles' fracture of left radius, initial encounter for closed fracture: Secondary | ICD-10-CM | POA: Diagnosis not present

## 2019-07-02 DIAGNOSIS — Z9889 Other specified postprocedural states: Secondary | ICD-10-CM

## 2019-07-02 NOTE — Patient Instructions (Signed)
Cancel weds appt    Brace all day and night   Remove 3 x a day for hand exercises

## 2019-07-02 NOTE — Progress Notes (Signed)
Chief Complaint  Patient presents with  . Routine Post Op    07/01/2019 left wrist ORIF     Postop day 1 volar plating  wound check -clean wound    Splint changed to brace  Fu 2 weeks   Xray

## 2019-07-07 ENCOUNTER — Ambulatory Visit: Payer: Medicare Other | Admitting: Orthopedic Surgery

## 2019-07-08 DIAGNOSIS — J449 Chronic obstructive pulmonary disease, unspecified: Secondary | ICD-10-CM | POA: Diagnosis not present

## 2019-07-08 NOTE — Addendum Note (Signed)
Addendum  created 07/08/19 1210 by Vista Deck, CRNA   Charge Capture section accepted

## 2019-07-12 ENCOUNTER — Encounter (HOSPITAL_COMMUNITY): Payer: Self-pay | Admitting: Orthopedic Surgery

## 2019-07-16 ENCOUNTER — Ambulatory Visit: Payer: Medicare Other

## 2019-07-16 ENCOUNTER — Other Ambulatory Visit: Payer: Self-pay

## 2019-07-16 ENCOUNTER — Encounter: Payer: Self-pay | Admitting: Orthopedic Surgery

## 2019-07-16 ENCOUNTER — Ambulatory Visit (INDEPENDENT_AMBULATORY_CARE_PROVIDER_SITE_OTHER): Payer: Medicare Other | Admitting: Orthopedic Surgery

## 2019-07-16 DIAGNOSIS — S52532D Colles' fracture of left radius, subsequent encounter for closed fracture with routine healing: Secondary | ICD-10-CM | POA: Diagnosis not present

## 2019-07-16 DIAGNOSIS — Z9889 Other specified postprocedural states: Secondary | ICD-10-CM

## 2019-07-16 DIAGNOSIS — Z8781 Personal history of (healed) traumatic fracture: Secondary | ICD-10-CM

## 2019-07-16 NOTE — Patient Instructions (Signed)
Brace except bathing and sleeping  ?

## 2019-07-16 NOTE — Progress Notes (Signed)
Chief Complaint  Patient presents with  . Routine Post Op    ORIF 07/01/19 wrist fracture right     Postop appointment we are now 15 days after open treatment internal fixation of a distal radius fracture of the right upper extremity the x-rays show excellent position of the fracture  The patient is moving her hand very well no swelling no neurovascular deficits  We recommend bracing and follow-up for x-ray in 6 weeks  Encounter Diagnoses  Name Primary?  . S/P ORIF (open reduction internal fixation) fracture 07/01/2019 Yes  . Colles' fracture of left radius, subsequent encounter for closed fracture with routine healing

## 2019-08-04 ENCOUNTER — Other Ambulatory Visit (HOSPITAL_COMMUNITY): Payer: Self-pay | Admitting: Hematology

## 2019-08-04 DIAGNOSIS — Z1231 Encounter for screening mammogram for malignant neoplasm of breast: Secondary | ICD-10-CM

## 2019-08-07 DIAGNOSIS — J449 Chronic obstructive pulmonary disease, unspecified: Secondary | ICD-10-CM | POA: Diagnosis not present

## 2019-08-18 ENCOUNTER — Ambulatory Visit (HOSPITAL_COMMUNITY): Payer: Medicare Other

## 2019-08-19 ENCOUNTER — Other Ambulatory Visit: Payer: Self-pay

## 2019-08-19 ENCOUNTER — Ambulatory Visit (HOSPITAL_COMMUNITY)
Admission: RE | Admit: 2019-08-19 | Discharge: 2019-08-19 | Disposition: A | Discharge status: Home / Self Care | Payer: Medicare Other | Source: Ambulatory Visit | Attending: Hematology | Admitting: Hematology

## 2019-08-19 DIAGNOSIS — Z1231 Encounter for screening mammogram for malignant neoplasm of breast: Secondary | ICD-10-CM | POA: Insufficient documentation

## 2019-08-23 ENCOUNTER — Ambulatory Visit (HOSPITAL_COMMUNITY): Payer: Medicare Other | Admitting: Hematology

## 2019-08-30 ENCOUNTER — Ambulatory Visit (INDEPENDENT_AMBULATORY_CARE_PROVIDER_SITE_OTHER): Payer: Medicare Other | Admitting: Orthopedic Surgery

## 2019-08-30 ENCOUNTER — Other Ambulatory Visit: Payer: Self-pay

## 2019-08-30 ENCOUNTER — Ambulatory Visit: Payer: Medicare Other

## 2019-08-30 VITALS — BP 126/72 | HR 76 | Temp 98.1°F | Ht 62.0 in | Wt 175.0 lb

## 2019-08-30 DIAGNOSIS — S62102D Fracture of unspecified carpal bone, left wrist, subsequent encounter for fracture with routine healing: Secondary | ICD-10-CM

## 2019-08-30 NOTE — Progress Notes (Signed)
Postop left wrist ORIF  Chief Complaint  Patient presents with  . Follow-up    Recheck on left wrist, DOI 07-01-19.    She is doing well her x-ray looks good her clinical exam is normal  Recommend splint as needed and follow-up as needed  Encounter Diagnosis  Name Primary?  . Wrist fracture, closed, left, with routine healing, subsequent encounter Yes

## 2019-08-31 ENCOUNTER — Encounter (HOSPITAL_COMMUNITY): Payer: Self-pay | Admitting: Nurse Practitioner

## 2019-08-31 ENCOUNTER — Inpatient Hospital Stay (HOSPITAL_COMMUNITY): Payer: Medicare Other

## 2019-08-31 ENCOUNTER — Inpatient Hospital Stay (HOSPITAL_COMMUNITY): Payer: Medicare Other | Attending: Nurse Practitioner | Admitting: Nurse Practitioner

## 2019-08-31 VITALS — BP 160/62 | HR 67 | Temp 97.7°F | Resp 20 | Wt 178.0 lb

## 2019-08-31 DIAGNOSIS — Z1231 Encounter for screening mammogram for malignant neoplasm of breast: Secondary | ICD-10-CM | POA: Diagnosis not present

## 2019-08-31 DIAGNOSIS — Z853 Personal history of malignant neoplasm of breast: Secondary | ICD-10-CM | POA: Insufficient documentation

## 2019-08-31 DIAGNOSIS — D649 Anemia, unspecified: Secondary | ICD-10-CM | POA: Insufficient documentation

## 2019-08-31 DIAGNOSIS — C50912 Malignant neoplasm of unspecified site of left female breast: Secondary | ICD-10-CM

## 2019-08-31 DIAGNOSIS — Z87891 Personal history of nicotine dependence: Secondary | ICD-10-CM | POA: Insufficient documentation

## 2019-08-31 DIAGNOSIS — M810 Age-related osteoporosis without current pathological fracture: Secondary | ICD-10-CM | POA: Insufficient documentation

## 2019-08-31 LAB — VITAMIN D 25 HYDROXY (VIT D DEFICIENCY, FRACTURES): Vit D, 25-Hydroxy: 66.6 ng/mL (ref 30–100)

## 2019-08-31 LAB — COMPREHENSIVE METABOLIC PANEL
ALT: 15 U/L (ref 0–44)
AST: 18 U/L (ref 15–41)
Albumin: 3.9 g/dL (ref 3.5–5.0)
Alkaline Phosphatase: 58 U/L (ref 38–126)
Anion gap: 11 (ref 5–15)
BUN: 30 mg/dL — ABNORMAL HIGH (ref 8–23)
CO2: 30 mmol/L (ref 22–32)
Calcium: 9.4 mg/dL (ref 8.9–10.3)
Chloride: 98 mmol/L (ref 98–111)
Creatinine, Ser: 0.65 mg/dL (ref 0.44–1.00)
GFR calc Af Amer: >60 mL/min (ref >60)
GFR calc non Af Amer: >60 mL/min (ref >60)
Glucose, Bld: 106 mg/dL — ABNORMAL HIGH (ref 70–99)
Potassium: 4.2 mmol/L (ref 3.5–5.1)
Sodium: 139 mmol/L (ref 135–145)
Total Bilirubin: 0.5 mg/dL (ref 0.3–1.2)
Total Protein: 7.7 g/dL (ref 6.5–8.1)

## 2019-08-31 LAB — CBC WITH DIFFERENTIAL/PLATELET
Abs Immature Granulocytes: 0.01 10*3/uL (ref 0.00–0.07)
Basophils Absolute: 0 10*3/uL (ref 0.0–0.1)
Basophils Relative: 0 %
Eosinophils Absolute: 0.3 10*3/uL (ref 0.0–0.5)
Eosinophils Relative: 5 %
HCT: 32.9 % — ABNORMAL LOW (ref 36.0–46.0)
Hemoglobin: 10.1 g/dL — ABNORMAL LOW (ref 12.0–15.0)
Immature Granulocytes: 0 %
Lymphocytes Relative: 27 %
Lymphs Abs: 1.9 10*3/uL (ref 0.7–4.0)
MCH: 29.2 pg (ref 26.0–34.0)
MCHC: 30.7 g/dL (ref 30.0–36.0)
MCV: 95.1 fL (ref 80.0–100.0)
Monocytes Absolute: 0.7 10*3/uL (ref 0.1–1.0)
Monocytes Relative: 9 %
Neutro Abs: 4.2 10*3/uL (ref 1.7–7.7)
Neutrophils Relative %: 59 %
Platelets: 221 10*3/uL (ref 150–400)
RBC: 3.46 MIL/uL — ABNORMAL LOW (ref 3.87–5.11)
RDW: 12.9 % (ref 11.5–15.5)
WBC: 7.2 10*3/uL (ref 4.0–10.5)
nRBC: 0 % (ref 0.0–0.2)

## 2019-08-31 LAB — IRON AND TIBC
Iron: 61 ug/dL (ref 28–170)
Saturation Ratios: 20 % (ref 10.4–31.8)
TIBC: 307 ug/dL (ref 250–450)
UIBC: 246 ug/dL

## 2019-08-31 LAB — FERRITIN: Ferritin: 55 ng/mL (ref 11–307)

## 2019-08-31 LAB — FOLATE: Folate: 32.3 ng/mL (ref >5.9)

## 2019-08-31 LAB — VITAMIN B12: Vitamin B-12: 677 pg/mL (ref 180–914)

## 2019-08-31 LAB — LACTATE DEHYDROGENASE: LDH: 150 U/L (ref 98–192)

## 2019-08-31 NOTE — Assessment & Plan Note (Addendum)
1.  Infiltrating ductal carcinoma of the left breast: -Patient was diagnosed with invasive ductal carcinoma the left breast on 03/28/2010. -She had a lumpectomy on 04/25/2010, her cancer was 2.3 cm in size with a negative sentinel lymph node.  ER/PR negative, HER-2 positive.  Ki 6780%, 3 lymph nodes were all negative, LVI was found. -She was treated with carboplatin/Taxol x4 cycles and Herceptin, for a total of 52 weeks -Her last dose of trastuzumab was on 05/15/2011, no evidence of disease. -She finished radiation therapy to the left breast by Dr. Pablo Ledger from 08/16/2010-09/18/2010. -Mammogram on 08/19/2019 was B RADS category 2 benign -Patient has had not had labs with [place] in over a year we will send her for labs today to check on her anemia. -Patient will follow-up in 1 year with repeat mammogram and labs.  2.  Osteoporosis: -DEXA scan done on 06/22/2018 showed a T score of -3.5. -Patient is on Fosamax and is followed by PCP. -Patient is continue to take calcium and vitamin D daily.

## 2019-08-31 NOTE — Progress Notes (Signed)
[place], [place] [address]   CLINIC:  Medical Oncology/Hematology  PCP:  [name], MD [address] [place] [address] [phone]   REASON FOR VISIT: Follow-up for breast cancer  CURRENT THERAPY: Observation   CANCER STAGING: Cancer Staging Infiltrating ductal carcinoma of left breast Staging form: Breast, AJCC 7th Edition - Clinical: Stage IIA (T2, N0, cM0) - Signed by Baird Cancer, PA on 03/05/2011 - Pathologic: Stage IIA (T2, N0, cM0) - Signed by Haywood Lasso, MD on 12/24/2011    INTERVAL HISTORY:  Paige Rhodes 85 y.o. female returns for routine follow-up for breast cancer.  Patient reports she has done well over the past year.  She has no complaints at this time.  She denies any new lumps or bumps present.  She follows up with her PCP regularly.Denies any nausea, vomiting, or diarrhea. Denies any new pains. Had not noticed any recent bleeding such as epistaxis, hematuria or hematochezia. Denies recent chest pain on exertion, shortness of breath on minimal exertion, pre-syncopal episodes, or palpitations. Denies any numbness or tingling in hands or feet. Denies any recent fevers, infections, or recent hospitalizations. Patient reports appetite at 100% and energy level at 100%.  Patient is eating well maintain her weight at this time     REVIEW OF SYSTEMS:  Review of Systems  Respiratory: Positive for shortness of breath.   Gastrointestinal: Positive for constipation.  All other systems reviewed and are negative.    PAST MEDICAL/SURGICAL HISTORY:  Past Medical History:  Diagnosis Date  . Breast cancer ([place])   . Chronic renal disease, stage 2, mildly decreased glomerular filtration rate between 60-89 mL/min/1.73 square meter 10/31/2014  . COPD (chronic obstructive pulmonary disease) ([place])   . Diabetes mellitus   . GERD (gastroesophageal reflux disease)    occasional  . Glaucoma   . Gout attack june 2013     left foot  . Hard of hearing   . Hyperlipidemia   . Hypertension   . Hypothyroidism   . Infiltrating ductal carcinoma of left breast 03/05/2011  . On home oxygen therapy    2 LPM  . Osteoporosis   . Port catheter in place 01/15/2012  . SOB (shortness of breath)   . Thyroid disease    Past Surgical History:  Procedure Laterality Date  . ANKLE SURGERY  1998  . APPENDECTOMY  1955  . BREAST LUMPECTOMY  2011   left  . CATARACT EXTRACTION W/PHACO  09/03/2012   Procedure: CATARACT EXTRACTION PHACO AND INTRAOCULAR LENS PLACEMENT (IOC);  Surgeon: Tonny Branch, MD;  Location: [facility];  Service: Ophthalmology;  Laterality: Right;  CDE: 15.26  . CATARACT EXTRACTION W/PHACO  09/28/2012   Procedure: CATARACT EXTRACTION PHACO AND INTRAOCULAR LENS PLACEMENT (IOC);  Surgeon: Tonny Branch, MD;  Location: [facility];  Service: Ophthalmology;  Laterality: Left;  CDE:  21.60  . CHOLECYSTECTOMY  1990  . OPEN REDUCTION INTERNAL FIXATION (ORIF) DISTAL RADIAL FRACTURE Left 07/01/2019   Procedure: OPEN REDUCTION INTERNAL FIXATION (ORIF) LEFT DISTAL RADIAL FRACTURE;  Surgeon: Carole Civil, MD;  Location: [facility];  Service: Orthopedics;  Laterality: Left;  . PORT-A-CATH REMOVAL N/A 04/26/2015   Procedure: REMOVAL PORT-A-CATH;  Surgeon: Aviva Signs, MD;  Location: [facility];  Service: General;  Laterality: N/A;  . PORTACATH PLACEMENT  05/23/10  . THYROID SURGERY  1994  . TONSILLECTOMY    . TOTAL HIP ARTHROPLASTY  1996     SOCIAL HISTORY:  Social History  Socioeconomic History  . Marital status: Married    Spouse name: Not on file  . Number of children: Not on file  . Years of education: Not on file  . Highest education level: Not on file  Occupational History  . Not on file  Tobacco Use  . Smoking status: Former Smoker    Packs/day: 3.00    Years: 30.00    Pack years: 90.00    Types: Cigarettes    Quit date: 09/02/1991    Years since quitting: 28.0  . Smokeless tobacco: Never Used  Substance and  Sexual Activity  . Alcohol use: No  . Drug use: No  . Sexual activity: Yes    Birth control/protection: Post-menopausal  Other Topics Concern  . Not on file  Social History Narrative  . Not on file   Social Determinants of Health   Financial Resource Strain:   . Difficulty of Paying Living Expenses: Not on file  Food Insecurity:   . Worried About Charity fundraiser in the Last Year: Not on file  . Ran Out of Food in the Last Year: Not on file  Transportation Needs:   . Lack of Transportation (Medical): Not on file  . Lack of Transportation (Non-Medical): Not on file  Physical Activity:   . Days of Exercise per Week: Not on file  . Minutes of Exercise per Session: Not on file  Stress:   . Feeling of Stress : Not on file  Social Connections:   . Frequency of Communication with Friends and Family: Not on file  . Frequency of Social Gatherings with Friends and Family: Not on file  . Attends Religious Services: Not on file  . Active Member of Clubs or Organizations: Not on file  . Attends Archivist Meetings: Not on file  . Marital Status: Not on file  Intimate Partner Violence:   . Fear of Current or Ex-Partner: Not on file  . Emotionally Abused: Not on file  . Physically Abused: Not on file  . Sexually Abused: Not on file    FAMILY HISTORY:  Family History  Problem Relation Age of Onset  . Kidney disease Mother   . Heart disease Father   . Hypertension Brother   . Cancer Sister        breast and lung    CURRENT MEDICATIONS:  Outpatient Encounter Medications as of 08/31/2019  Medication Sig Note  . ACCU-CHEK AVIVA PLUS test strip USE TO TEST ONCE DAILY.E   . alendronate (FOSAMAX) 70 MG tablet Take 70 mg by mouth every 7 (seven) days. Take with a full glass of water on an empty stomach. **Takes on Wednesday**   . amLODipine (NORVASC) 5 MG tablet Take 5 mg by mouth daily.    Marland Kitchen aspirin EC 81 MG tablet Take 81 mg by mouth daily.   Marland Kitchen atorvastatin (LIPITOR) 10  MG tablet Take 10 mg by mouth daily.   . Calcium-Magnesium-Vitamin D (CALCIUM 500 PO) Take 1 tablet by mouth daily.   . carvedilol (COREG) 25 MG tablet Take 25 mg by mouth 2 (two) times daily with a meal.     . Cholecalciferol (VITAMIN D-3) 1000 units CAPS Take 1 capsule by mouth daily.   . DiphenhydrAMINE HCl, Sleep, (NIGHTTIME SLEEP AID) 50 MG CAPS Take 1 capsule by mouth at bedtime.    Marland Kitchen levothyroxine (SYNTHROID, LEVOTHROID) 125 MCG tablet Take 125 mcg by mouth daily.     Marland Kitchen losartan (COZAAR) 50 MG tablet Take 50  mg by mouth daily.     Marland Kitchen SPIRIVA HANDIHALER 18 MCG inhalation capsule Place 18 mcg into inhaler and inhale daily. 11/06/2015: Received from: External Pharmacy Received Sig:   . ibuprofen (ADVIL,MOTRIN) 200 MG tablet Take 400 mg by mouth every 6 (six) hours as needed. For pain    Facility-Administered Encounter Medications as of 08/31/2019  Medication  . sodium chloride 0.9 % injection 10 mL    ALLERGIES:  Allergies  Allergen Reactions  . Penicillins Anaphylaxis    Edema of throat  . Hctz [Hydrochlorothiazide]     gout  . Morphine And Related Itching  . Lotrel [Amlodipine Besy-Benazepril Hcl]     Facial muscles draw and break out in whilps     PHYSICAL EXAM:  ECOG Performance status: 1  Vitals:   08/31/19 1433  BP: (!) 160/62  Pulse: 67  Resp: 20  Temp: 97.7 F (36.5 C)  SpO2: 93%   Filed Weights   08/31/19 1433  Weight: 178 lb (80.7 kg)    Physical Exam Constitutional:      Appearance: Normal appearance. She is normal weight.  Cardiovascular:     Rate and Rhythm: Normal rate and regular rhythm.     Heart sounds: Normal heart sounds.  Pulmonary:     Effort: Pulmonary effort is normal.     Breath sounds: Normal breath sounds.  Abdominal:     General: Bowel sounds are normal.     Palpations: Abdomen is soft.  Musculoskeletal:        General: Normal range of motion.  Skin:    General: Skin is warm.  Neurological:     Mental Status: She is alert and  oriented to person, place, and time. Mental status is at baseline.  Psychiatric:        Mood and Affect: Mood normal.        Behavior: Behavior normal.        Thought Content: Thought content normal.        Judgment: Judgment normal.      LABORATORY DATA:  I have reviewed the labs as listed.  CBC    Component Value Date/Time   WBC 9.3 06/30/2019 1142   RBC 3.60 (L) 06/30/2019 1142   HGB 10.5 (L) 06/30/2019 1142   HCT 34.1 (L) 06/30/2019 1142   PLT 263 06/30/2019 1142   MCV 94.7 06/30/2019 1142   MCH 29.2 06/30/2019 1142   MCHC 30.8 06/30/2019 1142   RDW 13.2 06/30/2019 1142   LYMPHSABS 1.4 06/30/2019 1142   MONOABS 0.8 06/30/2019 1142   EOSABS 0.3 06/30/2019 1142   BASOSABS 0.0 06/30/2019 1142   CMP Latest Ref Rng & Units 06/30/2019 09/04/2017 09/03/2017  Glucose 70 - 99 mg/dL 118(H) 175(H) 158(H)  BUN 8 - 23 mg/dL 19 66(H) 39(H)  Creatinine 0.44 - 1.00 mg/dL 0.63 1.44(H) 1.32(H)  Sodium 135 - 145 mmol/L 138 137 133(L)  Potassium 3.5 - 5.1 mmol/L 3.8 3.4(L) 3.8  Chloride 98 - 111 mmol/L 97(L) 96(L) 94(L)  CO2 22 - 32 mmol/L _0 Calcium 8.9 - 10.3 mg/dL 9.6 9.4 9.3  Total Protein 6.5 - 8.1 g/dL - - -  Total Bilirubin 0.3 - 1.2 mg/dL - - -  Alkaline Phos 38 - 126 U/L - - -  AST 15 - 41 U/L - - -  ALT 14 - 54 U/L - - -     DIAGNOSTIC IMAGING:  I have independently reviewed the mammogram scans and discussed with the patient.  I personally performed a face-to-face visit.  All questions were answered to patient's stated satisfaction. Encouraged patient to call with any new concerns or questions before his next visit to the cancer center and we can certain see him sooner, if needed.     ASSESSMENT & PLAN:   Infiltrating ductal carcinoma of left breast 1.  Infiltrating ductal carcinoma of the left breast: -Patient was diagnosed with invasive ductal carcinoma the left breast on 03/28/2010. -She had a lumpectomy on 04/25/2010, her cancer was 2.3 cm in size with a  negative sentinel lymph node.  ER/PR negative, HER-2 positive.  Ki 6780%, 3 lymph nodes were all negative, LVI was found. -She was treated with carboplatin/Taxol x4 cycles and Herceptin, for a total of 52 weeks -Her last dose of trastuzumab was on 05/15/2011, no evidence of disease. -She finished radiation therapy to the left breast by Dr. Pablo Ledger from 08/16/2010-09/18/2010. -Mammogram on 08/19/2019 was B RADS category 2 benign -Patient has had not had labs with [place] in over a year we will send her for labs today to check on her anemia. -Patient will follow-up in 1 year with repeat mammogram and labs.  2.  Osteoporosis: -DEXA scan done on 06/22/2018 showed a T score of -3.5. -Patient is on Fosamax and is followed by PCP. -Patient is continue to take calcium and vitamin D daily.      Orders placed this encounter:  Orders Placed This Encounter  Procedures  . MM Digital Screening  . Lactate dehydrogenase  . Protein electrophoresis, serum  . CBC with Differential/Platelet  . Comprehensive metabolic panel  . Ferritin  . Iron and TIBC  . Copper, serum  . Folate  . Vitamin D 25 hydroxy  . Vitamin B12      Francene Finders, FNP-C [place] [phone]

## 2019-09-01 ENCOUNTER — Other Ambulatory Visit (HOSPITAL_COMMUNITY): Payer: Medicare Other

## 2019-09-01 LAB — PROTEIN ELECTROPHORESIS, SERUM
A/G Ratio: 1.3 (ref 0.7–1.7)
Albumin ELP: 3.9 g/dL (ref 2.9–4.4)
Alpha-1-Globulin: 0.2 g/dL (ref 0.0–0.4)
Alpha-2-Globulin: 0.7 g/dL (ref 0.4–1.0)
Beta Globulin: 0.8 g/dL (ref 0.7–1.3)
Gamma Globulin: 1.3 g/dL (ref 0.4–1.8)
Globulin, Total: 3 g/dL (ref 2.2–3.9)
Total Protein ELP: 6.9 g/dL (ref 6.0–8.5)

## 2019-09-02 LAB — COPPER, SERUM: Copper: 112 ug/dL (ref 72–166)

## 2019-09-07 DIAGNOSIS — J449 Chronic obstructive pulmonary disease, unspecified: Secondary | ICD-10-CM | POA: Diagnosis not present

## 2019-09-16 ENCOUNTER — Other Ambulatory Visit (HOSPITAL_COMMUNITY): Payer: Self-pay | Admitting: Nurse Practitioner

## 2019-09-16 DIAGNOSIS — C50912 Malignant neoplasm of unspecified site of left female breast: Secondary | ICD-10-CM

## 2019-09-23 DIAGNOSIS — J9611 Chronic respiratory failure with hypoxia: Secondary | ICD-10-CM | POA: Diagnosis not present

## 2019-09-23 DIAGNOSIS — J449 Chronic obstructive pulmonary disease, unspecified: Secondary | ICD-10-CM | POA: Diagnosis not present

## 2019-10-08 DIAGNOSIS — J449 Chronic obstructive pulmonary disease, unspecified: Secondary | ICD-10-CM | POA: Diagnosis not present

## 2019-11-05 DIAGNOSIS — J449 Chronic obstructive pulmonary disease, unspecified: Secondary | ICD-10-CM | POA: Diagnosis not present

## 2019-12-06 DIAGNOSIS — J449 Chronic obstructive pulmonary disease, unspecified: Secondary | ICD-10-CM | POA: Diagnosis not present

## 2019-12-15 DIAGNOSIS — M109 Gout, unspecified: Secondary | ICD-10-CM | POA: Diagnosis not present

## 2019-12-15 DIAGNOSIS — Z79899 Other long term (current) drug therapy: Secondary | ICD-10-CM | POA: Diagnosis not present

## 2019-12-15 DIAGNOSIS — J9611 Chronic respiratory failure with hypoxia: Secondary | ICD-10-CM | POA: Diagnosis not present

## 2019-12-15 DIAGNOSIS — J449 Chronic obstructive pulmonary disease, unspecified: Secondary | ICD-10-CM | POA: Diagnosis not present

## 2019-12-15 DIAGNOSIS — I10 Essential (primary) hypertension: Secondary | ICD-10-CM | POA: Diagnosis not present

## 2019-12-21 DIAGNOSIS — J9611 Chronic respiratory failure with hypoxia: Secondary | ICD-10-CM | POA: Diagnosis not present

## 2019-12-21 DIAGNOSIS — E1129 Type 2 diabetes mellitus with other diabetic kidney complication: Secondary | ICD-10-CM | POA: Diagnosis not present

## 2020-01-05 DIAGNOSIS — J449 Chronic obstructive pulmonary disease, unspecified: Secondary | ICD-10-CM | POA: Diagnosis not present

## 2020-02-05 DIAGNOSIS — J449 Chronic obstructive pulmonary disease, unspecified: Secondary | ICD-10-CM | POA: Diagnosis not present

## 2020-03-06 DIAGNOSIS — J449 Chronic obstructive pulmonary disease, unspecified: Secondary | ICD-10-CM | POA: Diagnosis not present

## 2020-03-13 DIAGNOSIS — E119 Type 2 diabetes mellitus without complications: Secondary | ICD-10-CM | POA: Diagnosis not present

## 2020-04-06 DIAGNOSIS — J449 Chronic obstructive pulmonary disease, unspecified: Secondary | ICD-10-CM | POA: Diagnosis not present

## 2020-05-07 DIAGNOSIS — J449 Chronic obstructive pulmonary disease, unspecified: Secondary | ICD-10-CM | POA: Diagnosis not present

## 2020-06-06 DIAGNOSIS — J449 Chronic obstructive pulmonary disease, unspecified: Secondary | ICD-10-CM | POA: Diagnosis not present

## 2020-06-13 DIAGNOSIS — E1129 Type 2 diabetes mellitus with other diabetic kidney complication: Secondary | ICD-10-CM | POA: Diagnosis not present

## 2020-06-13 DIAGNOSIS — C50012 Malignant neoplasm of nipple and areola, left female breast: Secondary | ICD-10-CM | POA: Diagnosis not present

## 2020-06-13 DIAGNOSIS — J9611 Chronic respiratory failure with hypoxia: Secondary | ICD-10-CM | POA: Diagnosis not present

## 2020-06-13 DIAGNOSIS — E039 Hypothyroidism, unspecified: Secondary | ICD-10-CM | POA: Diagnosis not present

## 2020-06-13 DIAGNOSIS — Z79899 Other long term (current) drug therapy: Secondary | ICD-10-CM | POA: Diagnosis not present

## 2020-06-20 DIAGNOSIS — E785 Hyperlipidemia, unspecified: Secondary | ICD-10-CM | POA: Diagnosis not present

## 2020-06-20 DIAGNOSIS — Z0001 Encounter for general adult medical examination with abnormal findings: Secondary | ICD-10-CM | POA: Diagnosis not present

## 2020-06-20 DIAGNOSIS — R7309 Other abnormal glucose: Secondary | ICD-10-CM | POA: Diagnosis not present

## 2020-06-20 DIAGNOSIS — E039 Hypothyroidism, unspecified: Secondary | ICD-10-CM | POA: Diagnosis not present

## 2020-06-20 DIAGNOSIS — E1122 Type 2 diabetes mellitus with diabetic chronic kidney disease: Secondary | ICD-10-CM | POA: Diagnosis not present

## 2020-06-21 DIAGNOSIS — E1129 Type 2 diabetes mellitus with other diabetic kidney complication: Secondary | ICD-10-CM | POA: Diagnosis not present

## 2020-07-07 DIAGNOSIS — J449 Chronic obstructive pulmonary disease, unspecified: Secondary | ICD-10-CM | POA: Diagnosis not present

## 2020-07-11 ENCOUNTER — Other Ambulatory Visit (HOSPITAL_COMMUNITY): Payer: Self-pay | Admitting: Internal Medicine

## 2020-07-11 DIAGNOSIS — Z1231 Encounter for screening mammogram for malignant neoplasm of breast: Secondary | ICD-10-CM

## 2020-07-26 DIAGNOSIS — Z23 Encounter for immunization: Secondary | ICD-10-CM | POA: Diagnosis not present

## 2020-08-06 DIAGNOSIS — J449 Chronic obstructive pulmonary disease, unspecified: Secondary | ICD-10-CM | POA: Diagnosis not present

## 2020-08-15 ENCOUNTER — Other Ambulatory Visit (HOSPITAL_COMMUNITY): Payer: Medicare Other

## 2020-08-21 ENCOUNTER — Ambulatory Visit (HOSPITAL_COMMUNITY): Payer: Medicare Other

## 2020-08-21 ENCOUNTER — Other Ambulatory Visit: Payer: Self-pay

## 2020-08-22 ENCOUNTER — Ambulatory Visit (HOSPITAL_COMMUNITY): Payer: Medicare Other | Admitting: Nurse Practitioner

## 2020-08-28 DIAGNOSIS — J42 Unspecified chronic bronchitis: Secondary | ICD-10-CM | POA: Diagnosis not present

## 2020-08-30 ENCOUNTER — Other Ambulatory Visit: Payer: Self-pay

## 2020-08-30 ENCOUNTER — Ambulatory Visit (HOSPITAL_COMMUNITY)
Admission: RE | Admit: 2020-08-30 | Discharge: 2020-08-30 | Disposition: A | Discharge status: Home / Self Care | Payer: Medicare Other | Source: Ambulatory Visit | Attending: Internal Medicine | Admitting: Internal Medicine

## 2020-08-30 DIAGNOSIS — Z1231 Encounter for screening mammogram for malignant neoplasm of breast: Secondary | ICD-10-CM | POA: Diagnosis not present

## 2020-09-06 DIAGNOSIS — J449 Chronic obstructive pulmonary disease, unspecified: Secondary | ICD-10-CM | POA: Diagnosis not present

## 2020-10-07 DIAGNOSIS — J449 Chronic obstructive pulmonary disease, unspecified: Secondary | ICD-10-CM | POA: Diagnosis not present

## 2020-11-04 DIAGNOSIS — J449 Chronic obstructive pulmonary disease, unspecified: Secondary | ICD-10-CM | POA: Diagnosis not present

## 2020-12-05 DIAGNOSIS — J449 Chronic obstructive pulmonary disease, unspecified: Secondary | ICD-10-CM | POA: Diagnosis not present

## 2020-12-19 DIAGNOSIS — J9611 Chronic respiratory failure with hypoxia: Secondary | ICD-10-CM | POA: Diagnosis not present

## 2020-12-19 DIAGNOSIS — E1129 Type 2 diabetes mellitus with other diabetic kidney complication: Secondary | ICD-10-CM | POA: Diagnosis not present

## 2020-12-20 DIAGNOSIS — E1129 Type 2 diabetes mellitus with other diabetic kidney complication: Secondary | ICD-10-CM | POA: Diagnosis not present

## 2021-01-04 DIAGNOSIS — J449 Chronic obstructive pulmonary disease, unspecified: Secondary | ICD-10-CM | POA: Diagnosis not present

## 2021-01-23 ENCOUNTER — Inpatient Hospital Stay (HOSPITAL_COMMUNITY): Payer: Medicare Other

## 2021-01-30 ENCOUNTER — Ambulatory Visit (HOSPITAL_COMMUNITY): Payer: Medicare Other | Admitting: Hematology

## 2021-01-31 DIAGNOSIS — N3000 Acute cystitis without hematuria: Secondary | ICD-10-CM | POA: Diagnosis not present

## 2021-02-01 ENCOUNTER — Other Ambulatory Visit (HOSPITAL_COMMUNITY): Payer: Self-pay | Admitting: *Deleted

## 2021-02-01 DIAGNOSIS — C50912 Malignant neoplasm of unspecified site of left female breast: Secondary | ICD-10-CM

## 2021-02-01 DIAGNOSIS — D519 Vitamin B12 deficiency anemia, unspecified: Secondary | ICD-10-CM

## 2021-02-01 DIAGNOSIS — Z1231 Encounter for screening mammogram for malignant neoplasm of breast: Secondary | ICD-10-CM

## 2021-02-02 ENCOUNTER — Other Ambulatory Visit: Payer: Self-pay

## 2021-02-02 ENCOUNTER — Inpatient Hospital Stay (HOSPITAL_COMMUNITY): Payer: Medicare Other | Attending: Hematology

## 2021-02-02 DIAGNOSIS — Z9221 Personal history of antineoplastic chemotherapy: Secondary | ICD-10-CM | POA: Diagnosis not present

## 2021-02-02 DIAGNOSIS — D519 Vitamin B12 deficiency anemia, unspecified: Secondary | ICD-10-CM

## 2021-02-02 DIAGNOSIS — Z853 Personal history of malignant neoplasm of breast: Secondary | ICD-10-CM | POA: Diagnosis not present

## 2021-02-02 DIAGNOSIS — M810 Age-related osteoporosis without current pathological fracture: Secondary | ICD-10-CM | POA: Insufficient documentation

## 2021-02-02 DIAGNOSIS — Z1231 Encounter for screening mammogram for malignant neoplasm of breast: Secondary | ICD-10-CM

## 2021-02-02 DIAGNOSIS — C50912 Malignant neoplasm of unspecified site of left female breast: Secondary | ICD-10-CM

## 2021-02-02 LAB — IRON AND TIBC
Iron: 62 ug/dL (ref 28–170)
Saturation Ratios: 23 % (ref 10.4–31.8)
TIBC: 272 ug/dL (ref 250–450)
UIBC: 210 ug/dL

## 2021-02-02 LAB — CBC WITH DIFFERENTIAL/PLATELET
Abs Immature Granulocytes: 0.04 10*3/uL (ref 0.00–0.07)
Basophils Absolute: 0.1 10*3/uL (ref 0.0–0.1)
Basophils Relative: 1 %
Eosinophils Absolute: 0.4 10*3/uL (ref 0.0–0.5)
Eosinophils Relative: 4 %
HCT: 33.4 % — ABNORMAL LOW (ref 36.0–46.0)
Hemoglobin: 10.6 g/dL — ABNORMAL LOW (ref 12.0–15.0)
Immature Granulocytes: 0 %
Lymphocytes Relative: 18 %
Lymphs Abs: 2 10*3/uL (ref 0.7–4.0)
MCH: 29.4 pg (ref 26.0–34.0)
MCHC: 31.7 g/dL (ref 30.0–36.0)
MCV: 92.8 fL (ref 80.0–100.0)
Monocytes Absolute: 0.7 10*3/uL (ref 0.1–1.0)
Monocytes Relative: 6 %
Neutro Abs: 7.8 10*3/uL — ABNORMAL HIGH (ref 1.7–7.7)
Neutrophils Relative %: 71 %
Platelets: 276 10*3/uL (ref 150–400)
RBC: 3.6 MIL/uL — ABNORMAL LOW (ref 3.87–5.11)
RDW: 13.4 % (ref 11.5–15.5)
WBC: 11 10*3/uL — ABNORMAL HIGH (ref 4.0–10.5)
nRBC: 0 % (ref 0.0–0.2)

## 2021-02-02 LAB — FERRITIN: Ferritin: 98 ng/mL (ref 11–307)

## 2021-02-02 LAB — COMPREHENSIVE METABOLIC PANEL
ALT: 15 U/L (ref 0–44)
AST: 17 U/L (ref 15–41)
Albumin: 3.6 g/dL (ref 3.5–5.0)
Alkaline Phosphatase: 59 U/L (ref 38–126)
Anion gap: 9 (ref 5–15)
BUN: 31 mg/dL — ABNORMAL HIGH (ref 8–23)
CO2: 31 mmol/L (ref 22–32)
Calcium: 10.5 mg/dL — ABNORMAL HIGH (ref 8.9–10.3)
Chloride: 98 mmol/L (ref 98–111)
Creatinine, Ser: 0.96 mg/dL (ref 0.44–1.00)
GFR, Estimated: 57 mL/min — ABNORMAL LOW (ref >60)
Glucose, Bld: 110 mg/dL — ABNORMAL HIGH (ref 70–99)
Potassium: 4.2 mmol/L (ref 3.5–5.1)
Sodium: 138 mmol/L (ref 135–145)
Total Bilirubin: 0.5 mg/dL (ref 0.3–1.2)
Total Protein: 7.9 g/dL (ref 6.5–8.1)

## 2021-02-02 LAB — VITAMIN B12: Vitamin B-12: 998 pg/mL — ABNORMAL HIGH (ref 180–914)

## 2021-02-02 LAB — VITAMIN D 25 HYDROXY (VIT D DEFICIENCY, FRACTURES): Vit D, 25-Hydroxy: 80.64 ng/mL (ref 30–100)

## 2021-02-02 LAB — LACTATE DEHYDROGENASE: LDH: 129 U/L (ref 98–192)

## 2021-02-02 LAB — FOLATE: Folate: 46.6 ng/mL (ref >5.9)

## 2021-02-04 DIAGNOSIS — J449 Chronic obstructive pulmonary disease, unspecified: Secondary | ICD-10-CM | POA: Diagnosis not present

## 2021-02-04 LAB — PROTEIN ELECTROPHORESIS, SERUM
A/G Ratio: 0.8 (ref 0.7–1.7)
Albumin ELP: 3.4 g/dL (ref 2.9–4.4)
Alpha-1-Globulin: 0.3 g/dL (ref 0.0–0.4)
Alpha-2-Globulin: 0.9 g/dL (ref 0.4–1.0)
Beta Globulin: 1 g/dL (ref 0.7–1.3)
Gamma Globulin: 1.8 g/dL (ref 0.4–1.8)
Globulin, Total: 4.1 g/dL — ABNORMAL HIGH (ref 2.2–3.9)
Total Protein ELP: 7.5 g/dL (ref 6.0–8.5)

## 2021-02-04 LAB — COPPER, SERUM: Copper: 140 ug/dL (ref 80–158)

## 2021-02-09 ENCOUNTER — Inpatient Hospital Stay (HOSPITAL_BASED_OUTPATIENT_CLINIC_OR_DEPARTMENT_OTHER): Payer: Medicare Other | Admitting: Hematology and Oncology

## 2021-02-09 ENCOUNTER — Encounter (HOSPITAL_COMMUNITY): Payer: Self-pay | Admitting: Hematology and Oncology

## 2021-02-09 ENCOUNTER — Other Ambulatory Visit: Payer: Self-pay

## 2021-02-09 DIAGNOSIS — C50912 Malignant neoplasm of unspecified site of left female breast: Secondary | ICD-10-CM | POA: Diagnosis not present

## 2021-02-09 DIAGNOSIS — M810 Age-related osteoporosis without current pathological fracture: Secondary | ICD-10-CM

## 2021-02-09 NOTE — Progress Notes (Signed)
Paige Rhodes, [place] [address]   CLINIC:  Medical Oncology/Hematology  PCP:  [name], MD [address] [place] [address] [phone]   REASON FOR VISIT: Follow-up for breast cancer  CURRENT THERAPY: Observation   CANCER STAGING: Cancer Staging Infiltrating ductal carcinoma of left breast Staging form: Breast, AJCC 7th Edition - Clinical: Stage IIA (T2, N0, cM0) - Signed by Baird Cancer, PA on 03/05/2011 - Pathologic: Stage IIA (T2, N0, cM0) - Signed by Haywood Lasso, MD on 12/24/2011    INTERVAL HISTORY:   Paige Rhodes 87 y.o. female returns for routine follow-up for breast cancer for a telephone visit. She is doing well. She denies any concerns. She was last seen 1.5 yrs ago in person. She has been checking her breast, no lumps or bumps, skin or nipple changes. Last mammo in Dec 2021. No bone pains, change in breathing, bowel or urinary habits No recent hospitalizations. She is on bactrim now for UTI. Rest of the pertinent ROS at baseline. Baseline SOB from COPD.  REVIEW OF SYSTEMS:  Review of Systems  Respiratory:  Positive for shortness of breath.   Gastrointestinal:  Negative for constipation.  All other systems reviewed and are negative.   PAST MEDICAL/SURGICAL HISTORY:  Past Medical History:  Diagnosis Date   Breast cancer ([facility])    Chronic renal disease, stage 2, mildly decreased glomerular filtration rate between 60-89 mL/min/1.73 square meter 10/31/2014   COPD (chronic obstructive pulmonary disease) ([facility])    Diabetes mellitus    GERD (gastroesophageal reflux disease)    occasional   Glaucoma    Gout attack june 2013   left foot   Hard of hearing    Hyperlipidemia    Hypertension    Hypothyroidism    Infiltrating ductal carcinoma of left breast 03/05/2011   On home oxygen therapy    2 LPM   Osteoporosis    Port catheter in place 01/15/2012   SOB (shortness of breath)    Thyroid disease     Past Surgical History:  Procedure Laterality Date   [facility] LUMPECTOMY  2011   left   CATARACT EXTRACTION W/PHACO  09/03/2012   Procedure: CATARACT EXTRACTION PHACO AND INTRAOCULAR LENS PLACEMENT ([facility]);  Surgeon: Tonny Branch, MD;  Location: [facility];  Service: Ophthalmology;  Laterality: Right;  CDE: 15.26   CATARACT EXTRACTION W/PHACO  09/28/2012   Procedure: CATARACT EXTRACTION PHACO AND INTRAOCULAR LENS PLACEMENT (IOC);  Surgeon: Tonny Branch, MD;  Location: [facility];  Service: Ophthalmology;  Laterality: Left;  CDE:  21.60   CHOLECYSTECTOMY  1990   OPEN REDUCTION INTERNAL FIXATION (ORIF) DISTAL RADIAL FRACTURE Left 07/01/2019   Procedure: OPEN REDUCTION INTERNAL FIXATION (ORIF) LEFT DISTAL RADIAL FRACTURE;  Surgeon: Carole Civil, MD;  Location: [facility];  Service: Orthopedics;  Laterality: Left;   PORT-A-CATH REMOVAL N/A 04/26/2015   Procedure: REMOVAL PORT-A-CATH;  Surgeon: Aviva Signs, MD;  Location: [facility];  Service: General;  Laterality: N/A;   PORTACATH PLACEMENT  05/23/10   THYROID SURGERY  1994   TONSILLECTOMY     TOTAL HIP ARTHROPLASTY  1996     SOCIAL HISTORY:  Social History   Socioeconomic History   Marital status: Married    Spouse name: Not on file   Number of children: Not on file   Years of education: Not on file   Highest education level: Not on file  Occupational History   Not on file  Tobacco Use   Smoking status: Former    Packs/day: 3.00    Years: 30.00    Pack years: 90.00    Types: Cigarettes    Quit date: 09/02/1991    Years since quitting: 29.4   Smokeless tobacco: Never  Substance and Sexual Activity   Alcohol use: No   Drug use: No   Sexual activity: Yes    Birth control/protection: Post-menopausal  Other Topics Concern   Not on file  Social History Narrative   Not on file   Social Determinants of Health   Financial Resource Strain: Not on file  Food Insecurity: Not on file  Transportation  Needs: Not on file  Physical Activity: Not on file  Stress: Not on file  Social Connections: Not on file  Intimate Partner Violence: Not on file    FAMILY HISTORY:  Family History  Problem Relation Age of Onset   Kidney disease Mother    Heart disease Father    Hypertension Brother    Cancer Sister        breast and lung    CURRENT MEDICATIONS:  Outpatient Encounter Medications as of 02/09/2021  Medication Sig Note   ACCU-CHEK AVIVA PLUS test strip USE TO TEST ONCE DAILY.E    alendronate (FOSAMAX) 70 MG tablet Take 70 mg by mouth every 7 (seven) days. Take with a full glass of water on an empty stomach. **Takes on Wednesday**    amLODipine (NORVASC) 5 MG tablet Take 5 mg by mouth daily.     aspirin EC 81 MG tablet Take 81 mg by mouth daily.    atorvastatin (LIPITOR) 10 MG tablet Take 10 mg by mouth daily.    Calcium-Magnesium-Vitamin D (CALCIUM 500 PO) Take 1 tablet by mouth daily.    carvedilol (COREG) 25 MG tablet Take 25 mg by mouth 2 (two) times daily with a meal.      Cholecalciferol (VITAMIN D-3) 1000 units CAPS Take 1 capsule by mouth daily.    diphenhydrAMINE HCl, Sleep, 50 MG CAPS Take 1 capsule by mouth at bedtime.     ibuprofen (ADVIL,MOTRIN) 200 MG tablet Take 400 mg by mouth every 6 (six) hours as needed. For pain    levothyroxine (SYNTHROID, LEVOTHROID) 125 MCG tablet Take 125 mcg by mouth daily.      losartan (COZAAR) 50 MG tablet Take 50 mg by mouth daily.      SPIRIVA HANDIHALER 18 MCG inhalation capsule Place 18 mcg into inhaler and inhale daily. 11/06/2015: Received from: External Pharmacy Received Sig:    sulfamethoxazole-trimethoprim (BACTRIM DS) 800-160 MG tablet Take 1 tablet by mouth 2 (two) times daily.    Facility-Administered Encounter Medications as of 02/09/2021  Medication   sodium chloride 0.9 % injection 10 mL    ALLERGIES:  Allergies  Allergen Reactions   Penicillins Anaphylaxis    Edema of throat   Hctz [Hydrochlorothiazide]     gout    Morphine And Related Itching   Lotrel [Amlodipine Besy-Benazepril Hcl]     Facial muscles draw and break out in whilps     PHYSICAL EXAM:  ECOG Performance status: 1  There were no vitals filed for this visit.  There were no vitals filed for this visit.  PE not done, telephone visit.   LABORATORY DATA:  I have reviewed the labs as listed.  CBC    Component Value Date/Time   WBC 11.0 (H) 02/02/2021 1255   RBC 3.60 (L)  02/02/2021 1255   HGB 10.6 (L) 02/02/2021 1255   HCT 33.4 (L) 02/02/2021 1255   PLT 276 02/02/2021 1255   MCV 92.8 02/02/2021 1255   MCH 29.4 02/02/2021 1255   MCHC 31.7 02/02/2021 1255   RDW 13.4 02/02/2021 1255   LYMPHSABS 2.0 02/02/2021 1255   MONOABS 0.7 02/02/2021 1255   EOSABS 0.4 02/02/2021 1255   BASOSABS 0.1 02/02/2021 1255   CMP Latest Ref Rng & Units 02/02/2021 08/31/2019 06/30/2019  Glucose 70 - 99 mg/dL 110(H) 106(H) 118(H)  BUN 8 - 23 mg/dL 31(H) 30(H) 19  Creatinine 0.44 - 1.00 mg/dL 0.96 0.65 0.63  Sodium 135 - 145 mmol/L 138 139 138  Potassium 3.5 - 5.1 mmol/L 4.2 4.2 3.8  Chloride 98 - 111 mmol/L 98 98 97(L)  CO2 22 - 32 mmol/L '31 30 29  ' Calcium 8.9 - 10.3 mg/dL 10.5(H) 9.4 9.6  Total Protein 6.5 - 8.1 g/dL 7.9 7.7 -  Total Bilirubin 0.3 - 1.2 mg/dL 0.5 0.5 -  Alkaline Phos 38 - 126 U/L 59 58 -  AST 15 - 41 U/L 17 18 -  ALT 0 - 44 U/L 15 15 -    DIAGNOSTIC IMAGING:  Last mammogram imaging neg in Dec 2021.  ASSESSMENT & PLAN:  This is a very pleasant 87 year old female patient with past medical history significant for left invasive ductal carcinoma diagnosed in July 2011 status postlumpectomy in August 2011, her cancer was 2.3 cm in size, negative sentinel lymph nodes, ER/PR negative, HER2 amplified, Ki-67 of 80%, 3 lymph nodes negative.  Lymphovascular invasion was found.  She was treated with carboplatin/Taxol for 4 cycles and Herceptin for a total of 1 year. Her last mammogram was in December 2021, no concerns. Since she has not  had an in person visit and a breast exam in the past 1-1/2 years, have recommended that she schedule a follow-up for in person for breast exam in the next 1 to 2 months.  Next mammogram due in December 2022, ordered.   With regards to her osteoporosis, her last bone density was in 2019, she continues currently on Fosamax, calcium and vitamin D supplementation.  I recommended that we repeat a bone density.  She will finish bone density and return to clinic for a follow-up in person.  She should consider coming back annually for in person follow-ups. She expressed understanding of the recommendations.  Thank you for consulting [place] in the care of this patient.  Please do not hesitate to contact us with any additional questions or concerns.  Orders placed this encounter:  Orders Placed This Encounter  Procedures   DG Bone Density   MM DIGITAL SCREENING BILATERAL   Benay Pike MD  I connected with  Loyal Gambler on 02/09/21 by a telephone. application and verified that I am speaking with the correct person using two identifiers.   I discussed the limitations of evaluation and management by telemedicine. The patient expressed understanding and agreed to proceed. I spent 20 minutes in the care of this patient including review of previous records, mammogram reports, bone density reports and discussing the recommendations today.

## 2021-02-26 ENCOUNTER — Other Ambulatory Visit: Payer: Self-pay

## 2021-02-26 ENCOUNTER — Ambulatory Visit (HOSPITAL_COMMUNITY)
Admission: RE | Admit: 2021-02-26 | Discharge: 2021-02-26 | Disposition: A | Discharge status: Home / Self Care | Payer: Medicare Other | Source: Ambulatory Visit | Attending: Hematology and Oncology | Admitting: Hematology and Oncology

## 2021-02-26 DIAGNOSIS — M810 Age-related osteoporosis without current pathological fracture: Secondary | ICD-10-CM | POA: Insufficient documentation

## 2021-02-26 DIAGNOSIS — Z780 Asymptomatic menopausal state: Secondary | ICD-10-CM | POA: Diagnosis not present

## 2021-03-06 DIAGNOSIS — J449 Chronic obstructive pulmonary disease, unspecified: Secondary | ICD-10-CM | POA: Diagnosis not present

## 2021-03-14 DIAGNOSIS — E119 Type 2 diabetes mellitus without complications: Secondary | ICD-10-CM | POA: Diagnosis not present

## 2021-04-03 ENCOUNTER — Other Ambulatory Visit (HOSPITAL_COMMUNITY): Payer: Self-pay | Admitting: Surgery

## 2021-04-03 DIAGNOSIS — C50912 Malignant neoplasm of unspecified site of left female breast: Secondary | ICD-10-CM

## 2021-04-03 DIAGNOSIS — M810 Age-related osteoporosis without current pathological fracture: Secondary | ICD-10-CM

## 2021-04-05 ENCOUNTER — Inpatient Hospital Stay (HOSPITAL_COMMUNITY): Payer: Medicare Other | Attending: Hematology

## 2021-04-05 ENCOUNTER — Other Ambulatory Visit: Payer: Self-pay

## 2021-04-05 DIAGNOSIS — Z853 Personal history of malignant neoplasm of breast: Secondary | ICD-10-CM | POA: Insufficient documentation

## 2021-04-05 DIAGNOSIS — M810 Age-related osteoporosis without current pathological fracture: Secondary | ICD-10-CM | POA: Insufficient documentation

## 2021-04-05 DIAGNOSIS — C50912 Malignant neoplasm of unspecified site of left female breast: Secondary | ICD-10-CM

## 2021-04-05 LAB — CBC WITH DIFFERENTIAL/PLATELET
Abs Immature Granulocytes: 0.01 10*3/uL (ref 0.00–0.07)
Basophils Absolute: 0 10*3/uL (ref 0.0–0.1)
Basophils Relative: 0 %
Eosinophils Absolute: 0.3 10*3/uL (ref 0.0–0.5)
Eosinophils Relative: 4 %
HCT: 37.4 % (ref 36.0–46.0)
Hemoglobin: 11.8 g/dL — ABNORMAL LOW (ref 12.0–15.0)
Immature Granulocytes: 0 %
Lymphocytes Relative: 28 %
Lymphs Abs: 1.8 10*3/uL (ref 0.7–4.0)
MCH: 29.9 pg (ref 26.0–34.0)
MCHC: 31.6 g/dL (ref 30.0–36.0)
MCV: 94.7 fL (ref 80.0–100.0)
Monocytes Absolute: 0.5 10*3/uL (ref 0.1–1.0)
Monocytes Relative: 8 %
Neutro Abs: 3.7 10*3/uL (ref 1.7–7.7)
Neutrophils Relative %: 60 %
Platelets: 215 10*3/uL (ref 150–400)
RBC: 3.95 MIL/uL (ref 3.87–5.11)
RDW: 13.5 % (ref 11.5–15.5)
WBC: 6.3 10*3/uL (ref 4.0–10.5)
nRBC: 0 % (ref 0.0–0.2)

## 2021-04-05 LAB — COMPREHENSIVE METABOLIC PANEL
ALT: 16 U/L (ref 0–44)
AST: 21 U/L (ref 15–41)
Albumin: 4 g/dL (ref 3.5–5.0)
Alkaline Phosphatase: 61 U/L (ref 38–126)
Anion gap: 10 (ref 5–15)
BUN: 19 mg/dL (ref 8–23)
CO2: 31 mmol/L (ref 22–32)
Calcium: 9.5 mg/dL (ref 8.9–10.3)
Chloride: 97 mmol/L — ABNORMAL LOW (ref 98–111)
Creatinine, Ser: 0.64 mg/dL (ref 0.44–1.00)
GFR, Estimated: >60 mL/min (ref >60)
Glucose, Bld: 104 mg/dL — ABNORMAL HIGH (ref 70–99)
Potassium: 4.1 mmol/L (ref 3.5–5.1)
Sodium: 138 mmol/L (ref 135–145)
Total Bilirubin: 0.5 mg/dL (ref 0.3–1.2)
Total Protein: 8.1 g/dL (ref 6.5–8.1)

## 2021-04-06 DIAGNOSIS — J449 Chronic obstructive pulmonary disease, unspecified: Secondary | ICD-10-CM | POA: Diagnosis not present

## 2021-04-09 NOTE — Progress Notes (Signed)
[place], [place] [address]   CLINIC:  Medical Oncology/Hematology  PCP:  [name], MD [address] / [place] [address] [phone]   REASON FOR VISIT:  Follow-up for history of left breast cancer  PRIOR THERAPY: - Lumpectomy (August 2011) - Carboplatin/Taxol x4 cycles and Herceptin for total of 1 year - Radiation therapy to the left breast  CURRENT THERAPY: Surveillance  CANCER STAGING: Cancer Staging Infiltrating ductal carcinoma of left breast Staging form: Breast, AJCC 7th Edition - Clinical: Stage IIA (T2, N0, cM0) - Signed by Baird Cancer, PA on 03/05/2011 - Pathologic: Stage IIA (T2, N0, cM0) - Signed by Haywood Lasso, MD on 12/24/2011   INTERVAL HISTORY:  Paige Rhodes, a 87 y.o. female, returns for routine follow-up of her left breast cancer. Anzlee was last seen on 02/09/2021 by Dr. Chryl Heck via telemedicine visit.  She returns today for in person visit and breast exam.  At today's visit, she reports feeling well.  She denies any recent hospitalizations, surgeries, or changes in her baseline health status.  She denies any symptoms of recurrence such as new lumps, bone pain, chest pain, new dyspnea, or abdominal pain.  She has no new headaches, seizures, or focal neurologic deficits.  No B symptoms such as fever, chills, night sweats, unintentional weight loss.  She reports 75% energy and 100% appetite.  She is maintaining stable weight at this time.   REVIEW OF SYSTEMS:  Review of Systems  Constitutional:  Positive for fatigue (mild fatigue, energy 75%). Negative for appetite change, chills, diaphoresis, fever and unexpected weight change.  HENT:   Negative for lump/mass and nosebleeds.   Eyes:  Negative for eye problems.  Respiratory:  Positive for shortness of breath (chronic/baseline COPD, on 2 L supplemental oxygen). Negative for cough and hemoptysis.   Cardiovascular:  Negative for chest  pain, leg swelling and palpitations.  Gastrointestinal:  Positive for constipation. Negative for abdominal pain, blood in stool, diarrhea, nausea and vomiting.  Genitourinary:  Negative for hematuria.   Skin: Negative.   Neurological:  Negative for dizziness, headaches and light-headedness.  Hematological:  Does not bruise/bleed easily.   PAST MEDICAL/SURGICAL HISTORY:  Past Medical History:  Diagnosis Date   Breast cancer ([facility])    Chronic renal disease, stage 2, mildly decreased glomerular filtration rate between 60-89 mL/min/1.73 square meter 10/31/2014   COPD (chronic obstructive pulmonary disease) ([facility])    Diabetes mellitus    GERD (gastroesophageal reflux disease)    occasional   Glaucoma    Gout attack june 2013   left foot   Hard of hearing    Hyperlipidemia    Hypertension    Hypothyroidism    Infiltrating ductal carcinoma of left breast 03/05/2011   On home oxygen therapy    2 LPM   Osteoporosis    Port catheter in place 01/15/2012   SOB (shortness of breath)    Thyroid disease    Past Surgical History:  Procedure Laterality Date   [place] LUMPECTOMY  2011   left   CATARACT EXTRACTION W/PHACO  09/03/2012   Procedure: CATARACT EXTRACTION PHACO AND INTRAOCULAR LENS PLACEMENT ([facility]);  Surgeon: Tonny Branch, MD;  Location: [facility];  Service: Ophthalmology;  Laterality: Right;  CDE: 15.26   CATARACT EXTRACTION W/PHACO  09/28/2012   Procedure: CATARACT EXTRACTION PHACO AND INTRAOCULAR LENS PLACEMENT ([facility]);  Surgeon: Tonny Branch, MD;  Location: [facility];  Service: Ophthalmology;  Laterality: Left;  CDE:  21.60   CHOLECYSTECTOMY  1990   OPEN REDUCTION INTERNAL FIXATION (ORIF) DISTAL RADIAL FRACTURE Left 07/01/2019   Procedure: OPEN REDUCTION INTERNAL FIXATION (ORIF) LEFT DISTAL RADIAL FRACTURE;  Surgeon: Carole Civil, MD;  Location: [facility];  Service: Orthopedics;  Laterality: Left;   PORT-A-CATH REMOVAL N/A 04/26/2015   Procedure:  REMOVAL PORT-A-CATH;  Surgeon: Aviva Signs, MD;  Location: [facility];  Service: General;  Laterality: N/A;   PORTACATH PLACEMENT  05/23/10   THYROID SURGERY  1994   TONSILLECTOMY     TOTAL HIP ARTHROPLASTY  1996    SOCIAL HISTORY:  Social History   Socioeconomic History   Marital status: Married    Spouse name: Not on file   Number of children: Not on file   Years of education: Not on file   Highest education level: Not on file  Occupational History   Not on file  Tobacco Use   Smoking status: Former    Packs/day: 3.00    Years: 30.00    Pack years: 90.00    Types: Cigarettes    Quit date: 09/02/1991    Years since quitting: 29.6   Smokeless tobacco: Never  Substance and Sexual Activity   Alcohol use: No   Drug use: No   Sexual activity: Yes    Birth control/protection: Post-menopausal  Other Topics Concern   Not on file  Social History Narrative   Not on file   Social Determinants of Health   Financial Resource Strain: Not on file  Food Insecurity: Not on file  Transportation Needs: Not on file  Physical Activity: Not on file  Stress: Not on file  Social Connections: Not on file  Intimate Partner Violence: Not on file    FAMILY HISTORY:  Family History  Problem Relation Age of Onset   Kidney disease Mother    Heart disease Father    Hypertension Brother    Cancer Sister        breast and lung    CURRENT MEDICATIONS:  Current Outpatient Medications  Medication Sig Dispense Refill   ACCU-CHEK AVIVA PLUS test strip USE TO TEST ONCE DAILY.E     alendronate (FOSAMAX) 70 MG tablet Take 70 mg by mouth every 7 (seven) days. Take with a full glass of water on an empty stomach. **Takes on Wednesday**     amLODipine (NORVASC) 5 MG tablet Take 5 mg by mouth daily.      aspirin EC 81 MG tablet Take 81 mg by mouth daily.     atorvastatin (LIPITOR) 10 MG tablet Take 10 mg by mouth daily.     Calcium-Magnesium-Vitamin D (CALCIUM 500 PO) Take 1 tablet by mouth daily.      carvedilol (COREG) 25 MG tablet Take 25 mg by mouth 2 (two) times daily with a meal.       Cholecalciferol (VITAMIN D-3) 1000 units CAPS Take 1 capsule by mouth daily.     diphenhydrAMINE HCl, Sleep, 50 MG CAPS Take 1 capsule by mouth at bedtime.      ibuprofen (ADVIL,MOTRIN) 200 MG tablet Take 400 mg by mouth every 6 (six) hours as needed. For pain     levothyroxine (SYNTHROID, LEVOTHROID) 125 MCG tablet Take 125 mcg by mouth daily.       losartan (COZAAR) 50 MG tablet Take 50 mg by mouth daily.       SPIRIVA HANDIHALER 18 MCG inhalation capsule Place 18 mcg into  inhaler and inhale daily.     sulfamethoxazole-trimethoprim (BACTRIM DS) 800-160 MG tablet Take 1 tablet by mouth 2 (two) times daily.     No current facility-administered medications for this visit.   Facility-Administered Medications Ordered in Other Visits  Medication Dose Route Frequency Provider Last Rate Last Admin   sodium chloride 0.9 % injection 10 mL  10 mL Intravenous PRN Farrel Gobble, MD   10 mL at 05/05/14 1150    ALLERGIES:  Allergies  Allergen Reactions   Penicillins Anaphylaxis    Edema of throat   Hctz [Hydrochlorothiazide]     gout   Morphine And Related Itching   Lotrel [Amlodipine Besy-Benazepril Hcl]     Facial muscles draw and break out in whilps    PHYSICAL EXAM:  Performance status (ECOG): 2 - Symptomatic, <50% confined to bed  There were no vitals filed for this visit. Wt Readings from Last 3 Encounters:  08/31/19 178 lb (80.7 kg)  08/30/19 175 lb (79.4 kg)  06/30/19 172 lb (78 kg)   Physical Exam Constitutional:      Appearance: Normal appearance.     Interventions: Nasal cannula in place.  HENT:     Head: Normocephalic and atraumatic.     Mouth/Throat:     Mouth: Mucous membranes are moist.  Eyes:     Extraocular Movements: Extraocular movements intact.     Pupils: Pupils are equal, round, and reactive to light.  Cardiovascular:     Rate and Rhythm: Normal rate and regular  rhythm.     Pulses: Normal pulses.     Heart sounds: Normal heart sounds.  Pulmonary:     Effort: Pulmonary effort is normal.     Breath sounds: Decreased air movement present. Decreased breath sounds present.  Chest:    Abdominal:     General: Bowel sounds are normal.     Palpations: Abdomen is soft.     Tenderness: There is no abdominal tenderness.  Musculoskeletal:        General: No swelling.     Right lower leg: No edema.     Left lower leg: No edema.  Lymphadenopathy:     Cervical: No cervical adenopathy.  Skin:    General: Skin is warm and dry.  Neurological:     General: No focal deficit present.     Mental Status: She is alert and oriented to person, place, and time.  Psychiatric:        Mood and Affect: Mood normal.        Behavior: Behavior normal.     LABORATORY DATA:  I have reviewed the labs as listed.  CBC Latest Ref Rng & Units 04/05/2021 02/02/2021 08/31/2019  WBC 4.0 - 10.5 K/uL 6.3 11.0(H) 7.2  Hemoglobin 12.0 - 15.0 g/dL 11.8(L) 10.6(L) 10.1(L)  Hematocrit 36.0 - 46.0 % 37.4 33.4(L) 32.9(L)  Platelets 150 - 400 K/uL 215 276 221   CMP Latest Ref Rng & Units 04/05/2021 02/02/2021 08/31/2019  Glucose 70 - 99 mg/dL 104(H) 110(H) 106(H)  BUN 8 - 23 mg/dL 19 31(H) 30(H)  Creatinine 0.44 - 1.00 mg/dL 0.64 0.96 0.65  Sodium 135 - 145 mmol/L 138 138 139  Potassium 3.5 - 5.1 mmol/L 4.1 4.2 4.2  Chloride 98 - 111 mmol/L 97(L) 98 98  CO2 22 - 32 mmol/L '31 31 30  ' Calcium 8.9 - 10.3 mg/dL 9.5 10.5(H) 9.4  Total Protein 6.5 - 8.1 g/dL 8.1 7.9 7.7  Total Bilirubin 0.3 - 1.2 mg/dL 0.5 0.5  0.5  Alkaline Phos 38 - 126 U/L 61 59 58  AST 15 - 41 U/L '21 17 18  ' ALT 0 - 44 U/L '16 15 15    ' DIAGNOSTIC IMAGING:  I have independently reviewed the scans and discussed with the patient. No results found.   ASSESSMENT & PLAN: 1.  Stage IIa infiltrating ductal carcinoma of the left breast - Diagnosed in July 2011 - Lumpectomy in August 2011: Cancer was 2.3 cm in size, negative  sentinel lymph nodes, ER/PR negative, HER2 amplified, Ki-67 of 80%, 3 lymph nodes negative.  However, lymphovascular invasion was found. - Treated with carboplatin/Taxol x4 cycles, and Herceptin for total of 1 year (last dose on 05/15/2011) - She finished radiation therapy to the left breast by Dr. Pablo Ledger from 08/16/2010 through 09/18/2010 - Most recent mammogram (08/30/2020): BI-RADS Category 1, negative - Physical exam benign, no palpable nodules, masses, or lymphadenopathy on exam  - No alarm or red flag symptoms - Most recent labs (04/05/2021): CMP unremarkable, CBC with mildly decreased Hgb 11.8 (improved from previous) - PLAN:  Next mammogram due December 2022, ordered.  Continue annual in person follow-ups and labs.  2.  Osteoporosis - Most recent bone density (02/26/2021) with T score of -3.4 (measured at forearm radius) - Most recent vitamin D (02/02/2021) within normal limits at 80.64 - Currently managed by her PCP - on Fosamax, calcium, and vitamin D supplementation - PLAN:  Due to severe osteoporosis that has not improved on Fosamax, we we will discontinue Fosamax and start patient on Prolia injections every 6 months.  We have discussed risks and benefits of Prolia, as well as potential side effects and adverse events, and patient agrees to proceed.   PLAN SUMMARY & DISPOSITION: -CMP and Prolia every 6 months (first shot given today) - Mammogram in December 2022/January 2023 - Repeat labs and office visit in 1 year  All questions were answered. The patient knows to call the clinic with any problems, questions or concerns.  Medical decision making: Moderate  Time spent on visit: I spent 20 minutes counseling the patient face to face. The total time spent in the appointment was 30 minutes and more than 50% was on counseling.   Harriett Rush, PA-C  04/10/2021 3:48 PM

## 2021-04-10 ENCOUNTER — Inpatient Hospital Stay (HOSPITAL_BASED_OUTPATIENT_CLINIC_OR_DEPARTMENT_OTHER): Payer: Medicare Other | Admitting: Physician Assistant

## 2021-04-10 ENCOUNTER — Other Ambulatory Visit: Payer: Self-pay

## 2021-04-10 ENCOUNTER — Other Ambulatory Visit (HOSPITAL_COMMUNITY): Payer: Self-pay | Admitting: Hematology and Oncology

## 2021-04-10 VITALS — BP 148/54 | HR 63 | Temp 97.1°F | Resp 18 | Wt 171.1 lb

## 2021-04-10 DIAGNOSIS — C50912 Malignant neoplasm of unspecified site of left female breast: Secondary | ICD-10-CM

## 2021-04-10 DIAGNOSIS — M810 Age-related osteoporosis without current pathological fracture: Secondary | ICD-10-CM | POA: Diagnosis not present

## 2021-04-10 DIAGNOSIS — Z853 Personal history of malignant neoplasm of breast: Secondary | ICD-10-CM | POA: Diagnosis not present

## 2021-04-10 MED ORDER — DENOSUMAB 60 MG/ML ~~LOC~~ SOSY
PREFILLED_SYRINGE | SUBCUTANEOUS | Status: AC
  Filled 2021-04-10: qty 1

## 2021-04-10 MED ORDER — DENOSUMAB 60 MG/ML ~~LOC~~ SOSY
60.0000 mg | PREFILLED_SYRINGE | Freq: Once | SUBCUTANEOUS | Status: AC
  Administered 2021-04-10: 60 mg via SUBCUTANEOUS

## 2021-04-10 NOTE — Patient Instructions (Signed)
[place] at [facility] Discharge Instructions  You were seen today by Tarri Abernethy PA-C for your history of breast cancer and your osteoporosis.  Due to your severe osteoporosis that has not improved on Fosamax, we would like you to STOP taking Fosamax (alendronate).  Instead, we will start you on Prolia (denosumab) injections.  You received your first injection during your visit today, and you will need your next injection in 6 months.  Please read through the attached information on denosumab injections.  Your next mammogram is due in December 2022/January 2023.  We will schedule you for repeat labs and office visit in August 2023.  LABS: Return in 6 months for labs (same day as Prolia injection)  OTHER TESTS: Mammogram in December/January  MEDICATIONS: STOP taking Fosamax (alendronate).  We will START giving you Prolia (denosumab) injections every 6 months.  FOLLOW-UP APPOINTMENT: Office visit in 1 year   Thank you for choosing [place] at [facility] to provide your oncology and hematology care.  To afford each patient quality time with our provider, please arrive at least 15 minutes before your scheduled appointment time.   If you have a lab appointment with the [facility] please come in thru the Main Entrance and check in at the main information desk.  You need to re-schedule your appointment should you arrive 10 or more minutes late.  We strive to give you quality time with our providers, and arriving late affects you and other patients whose appointments are after yours.  Also, if you no show three or more times for appointments you may be dismissed from the clinic at the providers discretion.     Again, thank you for choosing [facility].  Our hope is that these requests will decrease the amount of time that you wait before being seen by our physicians.        _____________________________________________________________  Should you have questions after your visit to [facility], please contact our office at [phone] and follow the prompts.  Our office hours are 8:00 a.m. and 4:30 p.m. Monday - Friday.  Please note that voicemails left after 4:00 p.m. may not be returned until the following business day.  We are closed weekends and major holidays.  You do have access to a nurse 24-7, just call the main number to the clinic [phone] and do not press any options, hold on the line and a nurse will answer the phone.    For prescription refill requests, have your pharmacy contact our office and allow 72 hours.    Due to Covid, you will need to wear a mask upon entering the hospital. If you do not have a mask, a mask will be given to you at the Main Entrance upon arrival. For doctor visits, patients may have 1 support person age 11 or older with them. For treatment visits, patients can not have anyone with them due to social distancing guidelines and our immunocompromised population.

## 2021-04-12 ENCOUNTER — Ambulatory Visit (HOSPITAL_COMMUNITY): Payer: Medicare Other | Admitting: Hematology

## 2021-05-07 DIAGNOSIS — J449 Chronic obstructive pulmonary disease, unspecified: Secondary | ICD-10-CM | POA: Diagnosis not present

## 2021-06-01 DIAGNOSIS — E119 Type 2 diabetes mellitus without complications: Secondary | ICD-10-CM | POA: Diagnosis not present

## 2021-06-01 DIAGNOSIS — E785 Hyperlipidemia, unspecified: Secondary | ICD-10-CM | POA: Diagnosis not present

## 2021-06-01 DIAGNOSIS — I10 Essential (primary) hypertension: Secondary | ICD-10-CM | POA: Diagnosis not present

## 2021-06-06 DIAGNOSIS — J449 Chronic obstructive pulmonary disease, unspecified: Secondary | ICD-10-CM | POA: Diagnosis not present

## 2021-06-18 DIAGNOSIS — Z23 Encounter for immunization: Secondary | ICD-10-CM | POA: Diagnosis not present

## 2021-07-07 DIAGNOSIS — J449 Chronic obstructive pulmonary disease, unspecified: Secondary | ICD-10-CM | POA: Diagnosis not present

## 2021-08-01 DIAGNOSIS — E119 Type 2 diabetes mellitus without complications: Secondary | ICD-10-CM | POA: Diagnosis not present

## 2021-08-01 DIAGNOSIS — E785 Hyperlipidemia, unspecified: Secondary | ICD-10-CM | POA: Diagnosis not present

## 2021-08-01 DIAGNOSIS — I10 Essential (primary) hypertension: Secondary | ICD-10-CM | POA: Diagnosis not present

## 2021-08-06 DIAGNOSIS — J449 Chronic obstructive pulmonary disease, unspecified: Secondary | ICD-10-CM | POA: Diagnosis not present

## 2021-08-16 DIAGNOSIS — M109 Gout, unspecified: Secondary | ICD-10-CM | POA: Diagnosis not present

## 2021-08-16 DIAGNOSIS — Z79899 Other long term (current) drug therapy: Secondary | ICD-10-CM | POA: Diagnosis not present

## 2021-08-16 DIAGNOSIS — M810 Age-related osteoporosis without current pathological fracture: Secondary | ICD-10-CM | POA: Diagnosis not present

## 2021-08-16 DIAGNOSIS — J449 Chronic obstructive pulmonary disease, unspecified: Secondary | ICD-10-CM | POA: Diagnosis not present

## 2021-08-16 DIAGNOSIS — E785 Hyperlipidemia, unspecified: Secondary | ICD-10-CM | POA: Diagnosis not present

## 2021-08-23 DIAGNOSIS — J9611 Chronic respiratory failure with hypoxia: Secondary | ICD-10-CM | POA: Diagnosis not present

## 2021-08-23 DIAGNOSIS — E1122 Type 2 diabetes mellitus with diabetic chronic kidney disease: Secondary | ICD-10-CM | POA: Diagnosis not present

## 2021-08-23 DIAGNOSIS — I4510 Unspecified right bundle-branch block: Secondary | ICD-10-CM | POA: Diagnosis not present

## 2021-08-23 DIAGNOSIS — E785 Hyperlipidemia, unspecified: Secondary | ICD-10-CM | POA: Diagnosis not present

## 2021-08-23 DIAGNOSIS — Z853 Personal history of malignant neoplasm of breast: Secondary | ICD-10-CM | POA: Diagnosis not present

## 2021-09-05 ENCOUNTER — Other Ambulatory Visit: Payer: Self-pay

## 2021-09-05 ENCOUNTER — Ambulatory Visit (HOSPITAL_COMMUNITY)
Admission: RE | Admit: 2021-09-05 | Discharge: 2021-09-05 | Disposition: A | Discharge status: Home / Self Care | Payer: Medicare Other | Source: Ambulatory Visit | Attending: Hematology and Oncology | Admitting: Hematology and Oncology

## 2021-09-05 DIAGNOSIS — Z1231 Encounter for screening mammogram for malignant neoplasm of breast: Secondary | ICD-10-CM | POA: Diagnosis not present

## 2021-09-05 DIAGNOSIS — C50912 Malignant neoplasm of unspecified site of left female breast: Secondary | ICD-10-CM | POA: Diagnosis not present

## 2021-09-06 DIAGNOSIS — J449 Chronic obstructive pulmonary disease, unspecified: Secondary | ICD-10-CM | POA: Diagnosis not present

## 2021-10-07 DIAGNOSIS — J449 Chronic obstructive pulmonary disease, unspecified: Secondary | ICD-10-CM | POA: Diagnosis not present

## 2021-10-12 ENCOUNTER — Other Ambulatory Visit (HOSPITAL_COMMUNITY): Payer: Medicare Other

## 2021-10-12 ENCOUNTER — Ambulatory Visit (HOSPITAL_COMMUNITY): Payer: Medicare Other

## 2021-10-15 ENCOUNTER — Ambulatory Visit (HOSPITAL_COMMUNITY): Payer: Medicare Other

## 2021-10-15 ENCOUNTER — Inpatient Hospital Stay (HOSPITAL_COMMUNITY): Payer: Medicare Other | Attending: Hematology

## 2021-10-15 ENCOUNTER — Other Ambulatory Visit: Payer: Self-pay

## 2021-10-15 ENCOUNTER — Other Ambulatory Visit (HOSPITAL_COMMUNITY): Payer: Medicare Other

## 2021-10-15 ENCOUNTER — Inpatient Hospital Stay (HOSPITAL_COMMUNITY): Payer: Medicare Other

## 2021-10-15 VITALS — BP 153/65 | HR 58 | Temp 97.6°F

## 2021-10-15 DIAGNOSIS — M810 Age-related osteoporosis without current pathological fracture: Secondary | ICD-10-CM

## 2021-10-15 LAB — COMPREHENSIVE METABOLIC PANEL
ALT: 15 U/L (ref 0–44)
AST: 19 U/L (ref 15–41)
Albumin: 3.7 g/dL (ref 3.5–5.0)
Alkaline Phosphatase: 61 U/L (ref 38–126)
Anion gap: 8 (ref 5–15)
BUN: 20 mg/dL (ref 8–23)
CO2: 37 mmol/L — ABNORMAL HIGH (ref 22–32)
Calcium: 9.7 mg/dL (ref 8.9–10.3)
Chloride: 97 mmol/L — ABNORMAL LOW (ref 98–111)
Creatinine, Ser: 0.67 mg/dL (ref 0.44–1.00)
GFR, Estimated: >60 mL/min (ref >60)
Glucose, Bld: 137 mg/dL — ABNORMAL HIGH (ref 70–99)
Potassium: 4.2 mmol/L (ref 3.5–5.1)
Sodium: 142 mmol/L (ref 135–145)
Total Bilirubin: 0.3 mg/dL (ref 0.3–1.2)
Total Protein: 7.6 g/dL (ref 6.5–8.1)

## 2021-10-15 MED ORDER — DENOSUMAB 60 MG/ML ~~LOC~~ SOSY
60.0000 mg | PREFILLED_SYRINGE | Freq: Once | SUBCUTANEOUS | Status: AC
  Administered 2021-10-15: 60 mg via SUBCUTANEOUS
  Filled 2021-10-15: qty 1

## 2021-10-15 MED ORDER — SODIUM CHLORIDE 0.9 % IV SOLN: Freq: Once | INTRAVENOUS | Status: DC

## 2021-10-15 NOTE — Progress Notes (Signed)
Patient taking calcium as directed.  Denied tooth, jaw, and leg pain.  No recent or upcoming dental visits.  Labs reviewed.  Patient tolerated injection with no complaints voiced.  See MAR for details.  Patient stable during and after injection.  Site clean and dry with no bruising or swelling noted.  Band aid applied.  Vss with discharge and left in satisfactory condition with no s/s of distress noted.   

## 2021-10-15 NOTE — Patient Instructions (Signed)
[facility]  Discharge Instructions: Thank you for choosing [facility] to provide your oncology and hematology care.  If you have a lab appointment with the Cancer Center, please come in thru the Main Entrance and check in at the main information desk.  Wear comfortable clothing and clothing appropriate for easy access to any Portacath or PICC line.   We strive to give you quality time with your provider. You may need to reschedule your appointment if you arrive late (15 or more minutes).  Arriving late affects you and other patients whose appointments are after yours.  Also, if you miss three or more appointments without notifying the office, you may be dismissed from the clinic at the provider's discretion.      For prescription refill requests, have your pharmacy contact our office and allow 72 hours for refills to be completed.        To help prevent nausea and vomiting after your treatment, we encourage you to take your nausea medication as directed.  BELOW ARE SYMPTOMS THAT SHOULD BE REPORTED IMMEDIATELY: *FEVER GREATER THAN 100.4 F (38 C) OR HIGHER *CHILLS OR SWEATING *NAUSEA AND VOMITING THAT IS NOT CONTROLLED WITH YOUR NAUSEA MEDICATION *UNUSUAL SHORTNESS OF BREATH *UNUSUAL BRUISING OR BLEEDING *URINARY PROBLEMS (pain or burning when urinating, or frequent urination) *BOWEL PROBLEMS (unusual diarrhea, constipation, pain near the anus) TENDERNESS IN MOUTH AND THROAT WITH OR WITHOUT PRESENCE OF ULCERS (sore throat, sores in mouth, or a toothache) UNUSUAL RASH, SWELLING OR PAIN  UNUSUAL VAGINAL DISCHARGE OR ITCHING   Items with * indicate a potential emergency and should be followed up as soon as possible or go to the Emergency Department if any problems should occur.  Please show the CHEMOTHERAPY ALERT CARD or IMMUNOTHERAPY ALERT CARD at check-in to the Emergency Department and triage nurse.  Should you have questions after your visit or need to cancel  or reschedule your appointment, please contact [facility] [phone]  and follow the prompts.  Office hours are 8:00 a.m. to 4:30 p.m. Monday - Friday. Please note that voicemails left after 4:00 p.m. may not be returned until the following business day.  We are closed weekends and major holidays. You have access to a nurse at all times for urgent questions. Please call the main number to the clinic [phone] and follow the prompts.  For any non-urgent questions, you may also contact your provider using MyChart. We now offer e-Visits for anyone 18 and older to request care online for non-urgent symptoms. For details visit [email or web address].   Also download the MyChart app! Go to the app store, search "MyChart", open the app, select , and log in with your MyChart username and password.  Due to Covid, a mask is required upon entering the hospital/clinic. If you do not have a mask, one will be given to you upon arrival. For doctor visits, patients may have 1 support person aged 18 or older with them. For treatment visits, patients cannot have anyone with them due to current Covid guidelines and our immunocompromised population.  

## 2021-11-04 DIAGNOSIS — J449 Chronic obstructive pulmonary disease, unspecified: Secondary | ICD-10-CM | POA: Diagnosis not present

## 2021-12-05 DIAGNOSIS — J449 Chronic obstructive pulmonary disease, unspecified: Secondary | ICD-10-CM | POA: Diagnosis not present

## 2021-12-07 ENCOUNTER — Emergency Department (HOSPITAL_COMMUNITY): Payer: Medicare Other

## 2021-12-07 ENCOUNTER — Encounter (HOSPITAL_COMMUNITY): Payer: Self-pay | Admitting: *Deleted

## 2021-12-07 ENCOUNTER — Inpatient Hospital Stay (HOSPITAL_COMMUNITY)
Admission: EM | Admit: 2021-12-07 | Discharge: 2021-12-10 | DRG: 190 | Disposition: A | Discharge status: Home Health | Payer: Medicare Other | Attending: Internal Medicine | Admitting: Internal Medicine

## 2021-12-07 ENCOUNTER — Other Ambulatory Visit: Payer: Self-pay

## 2021-12-07 DIAGNOSIS — J441 Chronic obstructive pulmonary disease with (acute) exacerbation: Secondary | ICD-10-CM | POA: Diagnosis not present

## 2021-12-07 DIAGNOSIS — Z8249 Family history of ischemic heart disease and other diseases of the circulatory system: Secondary | ICD-10-CM

## 2021-12-07 DIAGNOSIS — E039 Hypothyroidism, unspecified: Secondary | ICD-10-CM | POA: Diagnosis not present

## 2021-12-07 DIAGNOSIS — N3000 Acute cystitis without hematuria: Secondary | ICD-10-CM | POA: Diagnosis not present

## 2021-12-07 DIAGNOSIS — N390 Urinary tract infection, site not specified: Secondary | ICD-10-CM | POA: Diagnosis present

## 2021-12-07 DIAGNOSIS — M810 Age-related osteoporosis without current pathological fracture: Secondary | ICD-10-CM | POA: Diagnosis not present

## 2021-12-07 DIAGNOSIS — Z20822 Contact with and (suspected) exposure to covid-19: Secondary | ICD-10-CM | POA: Diagnosis not present

## 2021-12-07 DIAGNOSIS — Z96649 Presence of unspecified artificial hip joint: Secondary | ICD-10-CM | POA: Diagnosis present

## 2021-12-07 DIAGNOSIS — N182 Chronic kidney disease, stage 2 (mild): Secondary | ICD-10-CM | POA: Diagnosis present

## 2021-12-07 DIAGNOSIS — J449 Chronic obstructive pulmonary disease, unspecified: Secondary | ICD-10-CM | POA: Diagnosis present

## 2021-12-07 DIAGNOSIS — Z87891 Personal history of nicotine dependence: Secondary | ICD-10-CM | POA: Diagnosis not present

## 2021-12-07 DIAGNOSIS — E785 Hyperlipidemia, unspecified: Secondary | ICD-10-CM | POA: Diagnosis not present

## 2021-12-07 DIAGNOSIS — Z853 Personal history of malignant neoplasm of breast: Secondary | ICD-10-CM

## 2021-12-07 DIAGNOSIS — K219 Gastro-esophageal reflux disease without esophagitis: Secondary | ICD-10-CM | POA: Diagnosis present

## 2021-12-07 DIAGNOSIS — C50919 Malignant neoplasm of unspecified site of unspecified female breast: Secondary | ICD-10-CM | POA: Diagnosis present

## 2021-12-07 DIAGNOSIS — Z9981 Dependence on supplemental oxygen: Secondary | ICD-10-CM | POA: Diagnosis not present

## 2021-12-07 DIAGNOSIS — R918 Other nonspecific abnormal finding of lung field: Secondary | ICD-10-CM | POA: Diagnosis present

## 2021-12-07 DIAGNOSIS — Z7982 Long term (current) use of aspirin: Secondary | ICD-10-CM

## 2021-12-07 DIAGNOSIS — Z79890 Hormone replacement therapy (postmenopausal): Secondary | ICD-10-CM

## 2021-12-07 DIAGNOSIS — I129 Hypertensive chronic kidney disease with stage 1 through stage 4 chronic kidney disease, or unspecified chronic kidney disease: Secondary | ICD-10-CM | POA: Diagnosis present

## 2021-12-07 DIAGNOSIS — J439 Emphysema, unspecified: Secondary | ICD-10-CM | POA: Diagnosis not present

## 2021-12-07 DIAGNOSIS — Z79899 Other long term (current) drug therapy: Secondary | ICD-10-CM

## 2021-12-07 DIAGNOSIS — R54 Age-related physical debility: Secondary | ICD-10-CM | POA: Diagnosis not present

## 2021-12-07 DIAGNOSIS — J9621 Acute and chronic respiratory failure with hypoxia: Secondary | ICD-10-CM | POA: Diagnosis present

## 2021-12-07 DIAGNOSIS — H9190 Unspecified hearing loss, unspecified ear: Secondary | ICD-10-CM | POA: Diagnosis present

## 2021-12-07 DIAGNOSIS — Z888 Allergy status to other drugs, medicaments and biological substances status: Secondary | ICD-10-CM | POA: Diagnosis not present

## 2021-12-07 DIAGNOSIS — Z885 Allergy status to narcotic agent status: Secondary | ICD-10-CM

## 2021-12-07 DIAGNOSIS — R0602 Shortness of breath: Secondary | ICD-10-CM | POA: Diagnosis not present

## 2021-12-07 DIAGNOSIS — C50912 Malignant neoplasm of unspecified site of left female breast: Secondary | ICD-10-CM | POA: Diagnosis not present

## 2021-12-07 DIAGNOSIS — Z841 Family history of disorders of kidney and ureter: Secondary | ICD-10-CM | POA: Diagnosis not present

## 2021-12-07 DIAGNOSIS — Z880 Allergy status to penicillin: Secondary | ICD-10-CM

## 2021-12-07 DIAGNOSIS — E782 Mixed hyperlipidemia: Secondary | ICD-10-CM | POA: Diagnosis present

## 2021-12-07 DIAGNOSIS — E1122 Type 2 diabetes mellitus with diabetic chronic kidney disease: Secondary | ICD-10-CM | POA: Diagnosis not present

## 2021-12-07 DIAGNOSIS — N179 Acute kidney failure, unspecified: Secondary | ICD-10-CM | POA: Diagnosis present

## 2021-12-07 DIAGNOSIS — J189 Pneumonia, unspecified organism: Secondary | ICD-10-CM | POA: Diagnosis not present

## 2021-12-07 DIAGNOSIS — Z801 Family history of malignant neoplasm of trachea, bronchus and lung: Secondary | ICD-10-CM

## 2021-12-07 DIAGNOSIS — Z803 Family history of malignant neoplasm of breast: Secondary | ICD-10-CM

## 2021-12-07 LAB — BASIC METABOLIC PANEL
Anion gap: 12 (ref 5–15)
BUN: 108 mg/dL — ABNORMAL HIGH (ref 8–23)
CO2: 28 mmol/L (ref 22–32)
Calcium: 11.6 mg/dL — ABNORMAL HIGH (ref 8.9–10.3)
Chloride: 98 mmol/L (ref 98–111)
Creatinine, Ser: 3.5 mg/dL — ABNORMAL HIGH (ref 0.44–1.00)
GFR, Estimated: 12 mL/min — ABNORMAL LOW (ref >60)
Glucose, Bld: 143 mg/dL — ABNORMAL HIGH (ref 70–99)
Potassium: 4.3 mmol/L (ref 3.5–5.1)
Sodium: 138 mmol/L (ref 135–145)

## 2021-12-07 LAB — CBC
HCT: 32.9 % — ABNORMAL LOW (ref 36.0–46.0)
Hemoglobin: 10.3 g/dL — ABNORMAL LOW (ref 12.0–15.0)
MCH: 28.8 pg (ref 26.0–34.0)
MCHC: 31.3 g/dL (ref 30.0–36.0)
MCV: 91.9 fL (ref 80.0–100.0)
Platelets: 323 10*3/uL (ref 150–400)
RBC: 3.58 MIL/uL — ABNORMAL LOW (ref 3.87–5.11)
RDW: 15.3 % (ref 11.5–15.5)
WBC: 16.4 10*3/uL — ABNORMAL HIGH (ref 4.0–10.5)
nRBC: 0 % (ref 0.0–0.2)

## 2021-12-07 LAB — URINALYSIS, ROUTINE W REFLEX MICROSCOPIC
Bilirubin Urine: NEGATIVE
Glucose, UA: NEGATIVE mg/dL
Ketones, ur: NEGATIVE mg/dL
Nitrite: NEGATIVE
Protein, ur: 100 mg/dL — AB
Specific Gravity, Urine: 1.011 (ref 1.005–1.030)
WBC, UA: >50 WBC/hpf — ABNORMAL HIGH (ref 0–5)
pH: 5 (ref 5.0–8.0)

## 2021-12-07 LAB — RESP PANEL BY RT-PCR (FLU A&B, COVID) ARPGX2
Influenza A by PCR: NEGATIVE
Influenza B by PCR: NEGATIVE
SARS Coronavirus 2 by RT PCR: NEGATIVE

## 2021-12-07 LAB — BRAIN NATRIURETIC PEPTIDE: B Natriuretic Peptide: 271 pg/mL — ABNORMAL HIGH (ref 0.0–100.0)

## 2021-12-07 MED ORDER — RENA-VITE PO TABS
1.0000 | ORAL_TABLET | Freq: Every day | ORAL | Status: DC
  Administered 2021-12-07 – 2021-12-10 (×4): 1 via ORAL
  Filled 2021-12-07 (×4): qty 1

## 2021-12-07 MED ORDER — PREDNISONE 20 MG PO TABS
50.0000 mg | ORAL_TABLET | Freq: Every day | ORAL | Status: AC
  Administered 2021-12-08 – 2021-12-09 (×2): 50 mg via ORAL
  Filled 2021-12-07 (×3): qty 1

## 2021-12-07 MED ORDER — HEPARIN SODIUM (PORCINE) 5000 UNIT/ML IJ SOLN
5000.0000 [IU] | Freq: Three times a day (TID) | INTRAMUSCULAR | Status: DC
  Administered 2021-12-07 – 2021-12-10 (×8): 5000 [IU] via SUBCUTANEOUS
  Filled 2021-12-07 (×8): qty 1

## 2021-12-07 MED ORDER — CIPROFLOXACIN IN D5W 400 MG/200ML IV SOLN
400.0000 mg | INTRAVENOUS | Status: DC
  Administered 2021-12-07: 400 mg via INTRAVENOUS
  Filled 2021-12-07: qty 200

## 2021-12-07 MED ORDER — PREDNISONE 20 MG PO TABS
20.0000 mg | ORAL_TABLET | Freq: Every day | ORAL | Status: DC
  Administered 2021-12-10: 20 mg via ORAL
  Filled 2021-12-07 (×2): qty 1

## 2021-12-07 MED ORDER — FLUTICASONE FUROATE-VILANTEROL 100-25 MCG/ACT IN AEPB
1.0000 | INHALATION_SPRAY | Freq: Every day | RESPIRATORY_TRACT | Status: DC
  Administered 2021-12-07 – 2021-12-10 (×4): 1 via RESPIRATORY_TRACT
  Filled 2021-12-07: qty 28

## 2021-12-07 MED ORDER — SODIUM CHLORIDE 0.9 % IV SOLN
1000.0000 mL | INTRAVENOUS | Status: AC
  Administered 2021-12-07 (×2): 1000 mL via INTRAVENOUS

## 2021-12-07 MED ORDER — LOSARTAN POTASSIUM 50 MG PO TABS
50.0000 mg | ORAL_TABLET | Freq: Every day | ORAL | Status: DC
  Administered 2021-12-07 – 2021-12-10 (×4): 50 mg via ORAL
  Filled 2021-12-07 (×4): qty 1

## 2021-12-07 MED ORDER — AMLODIPINE BESYLATE 5 MG PO TABS
5.0000 mg | ORAL_TABLET | Freq: Every day | ORAL | Status: DC
  Administered 2021-12-07 – 2021-12-10 (×4): 5 mg via ORAL
  Filled 2021-12-07 (×5): qty 1

## 2021-12-07 MED ORDER — SODIUM CHLORIDE 0.9 % IV BOLUS (SEPSIS)
500.0000 mL | Freq: Once | INTRAVENOUS | Status: AC
Start: 2021-12-07 — End: 2021-12-07
  Administered 2021-12-07: 500 mL via INTRAVENOUS

## 2021-12-07 MED ORDER — LEVOFLOXACIN IN D5W 250 MG/50ML IV SOLN
250.0000 mg | INTRAVENOUS | Status: DC
  Filled 2021-12-07: qty 50

## 2021-12-07 MED ORDER — LEVOTHYROXINE SODIUM 25 MCG PO TABS
125.0000 ug | ORAL_TABLET | Freq: Every day | ORAL | Status: DC
  Administered 2021-12-08 – 2021-12-10 (×3): 125 ug via ORAL
  Filled 2021-12-07 (×3): qty 1

## 2021-12-07 MED ORDER — IPRATROPIUM-ALBUTEROL 0.5-2.5 (3) MG/3ML IN SOLN
3.0000 mL | Freq: Once | RESPIRATORY_TRACT | Status: AC
Start: 2021-12-07 — End: 2021-12-07
  Administered 2021-12-07: 3 mL via RESPIRATORY_TRACT
  Filled 2021-12-07: qty 3

## 2021-12-07 MED ORDER — SODIUM CHLORIDE 0.9 % IV SOLN
1000.0000 mL | INTRAVENOUS | Status: DC
  Administered 2021-12-07: 1000 mL via INTRAVENOUS

## 2021-12-07 MED ORDER — IPRATROPIUM-ALBUTEROL 0.5-2.5 (3) MG/3ML IN SOLN: 3.0000 mL | RESPIRATORY_TRACT | Status: DC | PRN

## 2021-12-07 MED ORDER — CARVEDILOL 12.5 MG PO TABS
25.0000 mg | ORAL_TABLET | Freq: Two times a day (BID) | ORAL | Status: DC
  Administered 2021-12-08 – 2021-12-10 (×5): 25 mg via ORAL
  Filled 2021-12-07 (×5): qty 2

## 2021-12-07 MED ORDER — METHYLPREDNISOLONE SODIUM SUCC 125 MG IJ SOLR
125.0000 mg | Freq: Once | INTRAMUSCULAR | Status: AC
  Administered 2021-12-07: 125 mg via INTRAVENOUS
  Filled 2021-12-07: qty 2

## 2021-12-07 MED ORDER — ATORVASTATIN CALCIUM 10 MG PO TABS
10.0000 mg | ORAL_TABLET | Freq: Every day | ORAL | Status: DC
  Administered 2021-12-07 – 2021-12-10 (×4): 10 mg via ORAL
  Filled 2021-12-07 (×4): qty 1

## 2021-12-07 NOTE — ED Triage Notes (Signed)
Multiple complaints, states everything is wrong, states she cannot hold her urine ?

## 2021-12-07 NOTE — ED Triage Notes (Signed)
Patient is oxygen dependant ?

## 2021-12-07 NOTE — ED Provider Notes (Signed)
?[facility] ?Provider Note ? ? ?CSN: [id number] ?Arrival date & time: 12/07/21  1129 ? ?  ? ?History ? ?Chief Complaint  ?Patient presents with  ? Shortness of Breath  ? ? ?Paige Rhodes is a 88 y.o. female. ? ? ?Shortness of Breath ? ?  ?Patient presents to the ED for evaluation of shortness of breath urinary frequency.  Patient states over the last few days she has had some increasing difficulty with her breathing.  She denies any coughing.  She denies any leg swelling.  She does have chronic COPD and is normally on supplemental oxygen.  Patient states she has been feeling weak over the last couple days.  She has noticed that she has not been able to get to the bathroom in time and has been incontinent of urine.  She does have the sense that she has to urinate but just cannot get to the bathroom in time.  Partly she attributes that to feeling weaker than usual.  She has not had any fevers.  No vomiting or diarrhea.  No chest pain or abdominal pain. ?Home Medications ?Prior to Admission medications   ?Medication Sig Start Date End Date Taking? Authorizing Provider  ?albuterol (VENTOLIN HFA) 108 (90 Base) MCG/ACT inhaler Inhale 2 puffs into the lungs every 6 (six) hours as needed for wheezing or shortness of breath. 12/21/20   [provider]  ?amLODipine (NORVASC) 5 MG tablet Take 5 mg by mouth daily.     [provider]  ?aspirin EC 81 MG tablet Take 81 mg by mouth daily.    [provider]  ?atorvastatin (LIPITOR) 10 MG tablet Take 10 mg by mouth daily. 08/18/19   [provider]  ?Calcium-Magnesium-Vitamin D (CALCIUM 500 PO) Take 1 tablet by mouth daily.    [provider]  ?carvedilol (COREG) 25 MG tablet Take 25 mg by mouth 2 (two) times daily with a meal.      [provider]  ?Cholecalciferol (VITAMIN D-3) 1000 units CAPS Take 1,000 Units by mouth daily.    [provider]  ?ciprofloxacin (CIPRO) 250 MG tablet Take 250 mg by  mouth 2 (two) times daily. For 5 days 12/06/21   [provider]  ?diphenhydrAMINE HCl, Sleep, 50 MG CAPS Take 50 mg by mouth at bedtime.    [provider]  ?levothyroxine (SYNTHROID, LEVOTHROID) 125 MCG tablet Take 125 mcg by mouth daily.      [provider]  ?losartan (COZAAR) 50 MG tablet Take 50 mg by mouth daily.      [provider]  ?SPIRIVA HANDIHALER 18 MCG inhalation capsule Place 18 mcg into inhaler and inhale daily. 10/02/15   [provider]  ?   ? ?Allergies    ?Penicillins, Hctz [hydrochlorothiazide], Morphine and related, and Lotrel [amlodipine besy-benazepril hcl]   ? ?Review of Systems   ?Review of Systems  ?Respiratory:  Positive for shortness of breath.   ? ?Physical Exam ?Updated Vital Signs ?BP (!) 144/66   Pulse 70   Temp 97.7 ?F (36.5 ?C) (Oral)   Resp 13   Ht 1.575 m ('5\' 2"'$ )   Wt 79.4 kg   SpO2 99%   BMI 32.01 kg/m?  ?Physical Exam ?Vitals and nursing note reviewed.  ?Constitutional:   ?   Appearance: She is ill-appearing.  ?   Comments: Elderly, frail  ?HENT:  ?   Head: Normocephalic and atraumatic.  ?   Right Ear: External ear normal.  ?  Left Ear: External ear normal.  ?Eyes:  ?   General: No scleral icterus.    ?   Right eye: No discharge.     ?   Left eye: No discharge.  ?   Conjunctiva/sclera: Conjunctivae normal.  ?Neck:  ?   Trachea: No tracheal deviation.  ?Cardiovascular:  ?   Rate and Rhythm: Normal rate and regular rhythm.  ?Pulmonary:  ?   Effort: Accessory muscle usage present. No respiratory distress.  ?   Breath sounds: No stridor. Decreased breath sounds present.  ?Abdominal:  ?   General: Bowel sounds are normal. There is no distension.  ?   Palpations: Abdomen is soft.  ?   Tenderness: There is no abdominal tenderness. There is no guarding or rebound.  ?Musculoskeletal:     ?   General: No tenderness or deformity.  ?   Cervical back: Neck supple.  ?   Right lower leg: Edema present.  ?   Left lower leg: Edema present.  ?    Comments: Mild edema bilateral lower legs  ?Skin: ?   General: Skin is warm and dry.  ?   Findings: No rash.  ?Neurological:  ?   General: No focal deficit present.  ?   Mental Status: She is alert.  ?   Cranial Nerves: No cranial nerve deficit (no facial droop, extraocular movements intact, no slurred speech).  ?   Sensory: No sensory deficit.  ?   Motor: No abnormal muscle tone or seizure activity.  ?   Coordination: Coordination normal.  ?Psychiatric:     ?   Mood and Affect: Mood normal.  ? ? ?ED Results / Procedures / Treatments   ?Labs ?(all labs ordered are listed, but only abnormal results are displayed) ?Labs Reviewed  ?BASIC METABOLIC PANEL - Abnormal; Notable for the following components:  ?    Result Value  ? Glucose, Bld 143 (*)   ? BUN 108 (*)   ? Creatinine, Ser 3.50 (*)   ? Calcium 11.6 (*)   ? GFR, Estimated 12 (*)   ? All other components within normal limits  ?CBC - Abnormal; Notable for the following components:  ? WBC 16.4 (*)   ? RBC 3.58 (*)   ? Hemoglobin 10.3 (*)   ? HCT 32.9 (*)   ? All other components within normal limits  ?BRAIN NATRIURETIC PEPTIDE - Abnormal; Notable for the following components:  ? B Natriuretic Peptide 271.0 (*)   ? All other components within normal limits  ?URINALYSIS, ROUTINE W REFLEX MICROSCOPIC - Abnormal; Notable for the following components:  ? APPearance TURBID (*)   ? Hgb urine dipstick MODERATE (*)   ? Protein, ur 100 (*)   ? Leukocytes,Ua LARGE (*)   ? WBC, UA >50 (*)   ? Bacteria, UA MANY (*)   ? All other components within normal limits  ?RESP PANEL BY RT-PCR (FLU A&B, COVID) [id number]  ?URINE CULTURE  ? ? ?EKG ?EKG Interpretation ? ?Date/Time:  Friday December 07 2021 12:07:10 EDT ?Ventricular Rate:  71 ?PR Interval:  156 ?QRS Duration: 146 ?QT Interval:  404 ?QTC Calculation: 439 ?R Axis:   203 ?Text Interpretation: Sinus rhythm Nonspecific intraventricular conduction delay , new since last tracing Borderline ST depression, anterior leads Confirmed by Dorie Rank [phone]) on 12/07/2021 2:10:10 PM ? ?Radiology ?DG Chest Port 1 View ? ?Result Date: 12/07/2021 ?CLINICAL DATA:  Shortness of breath. EXAM: PORTABLE CHEST 1 VIEW COMPARISON:  Chest x-ray dated  October 15, 2017. FINDINGS: The heart size and mediastinal contours are within normal limits. Density in the right lung apex appears increased since 2019. Mildly progressive basilar predominant chronic interstitial thickening. No focal consolidation, pleural effusion, or pneumothorax. No acute osseous abnormality. IMPRESSION: 1. Increasing density in the right lung apex. Given smoking history, noncontrast chest CT is recommended to exclude underlying mass. 2. Mildly progressive chronic interstitial lung disease. Electronically Signed   By: Titus Dubin M.D.   On: 12/07/2021 12:28   ? ?Procedures ?Procedures  ? ? ?Medications Ordered in ED ?Medications  ?sodium chloride 0.9 % bolus 500 mL (0 mLs Intravenous Stopped 12/07/21 1436)  ?  Followed by  ?0.9 %  sodium chloride infusion (1,000 mLs Intravenous New Bag/Given 12/07/21 1436)  ?ciprofloxacin (CIPRO) IVPB 400 mg (400 mg Intravenous New Bag/Given 12/07/21 1435)  ?ipratropium-albuterol (DUONEB) 0.5-2.5 (3) MG/3ML nebulizer solution 3 mL (3 mLs Nebulization Given 12/07/21 1252)  ?methylPREDNISolone sodium succinate (SOLU-MEDROL) 125 mg/2 mL injection 125 mg (125 mg Intravenous Given 12/07/21 1220)  ? ? ?ED Course/ Medical Decision Making/ A&P ?Clinical Course as of 12/07/21 1456  ?Fri Dec 07, 2021  ?1349 Urinalysis, Routine w reflex microscopic Urine, In & Out Cath(!) ?Urinalysis consistent with urinary tract infection [JK]  ?5188 Basic metabolic panel(!) ?Creatinine elevated compared to previous [JK]  ?1349 CBC(!) ?Anemia is stable [JK]  ?MarvinChest x-ray concerning for possible underlying mass.  CT scan recommended [JK]  ?1445 Bladder scan showed 222 cc.  No evidence of urinary retention [JK]  ?[place] Discussed with Dr Avon Gully. [JK]  ?  ?Clinical Course User  Index ?[JK] Dorie Rank, MD  ? ?                        ?Medical Decision Making ?Amount and/or Complexity of Data Reviewed ?External Data Reviewed: notes. ?   Details: Oncology outpatient notes reviewed ?Labs:

## 2021-12-07 NOTE — Progress Notes (Signed)
Pharmacy Antibiotic Note ? ?Paige Rhodes a 88 y.o. female admitted on 12/07/2021 with UTI.  Pharmacy has been consulted for ciprofloxacin dosing. ? ?Plan: ?Ciprofloxacin '400mg'$  iv q24h  ? ?Medical History: ?Past Medical History:  ?Diagnosis Date  ? Breast cancer ([place])   ? Chronic renal disease, stage 2, mildly decreased glomerular filtration rate between 60-89 mL/min/1.73 square meter 10/31/2014  ? COPD (chronic obstructive pulmonary disease) ([place])   ? Diabetes mellitus   ? GERD (gastroesophageal reflux disease)   ? occasional  ? Glaucoma   ? Gout attack june 2013  ? left foot  ? Hard of hearing   ? Hyperlipidemia   ? Hypertension   ? Hypothyroidism   ? Infiltrating ductal carcinoma of left breast 03/05/2011  ? On home oxygen therapy   ? 2 LPM  ? Osteoporosis   ? Port catheter in place 01/15/2012  ? SOB (shortness of breath)   ? Thyroid disease   ? ? ?Allergies:  ?Allergies  ?Allergen Reactions  ? Penicillins Anaphylaxis  ?  Edema of throat  ? Hctz [Hydrochlorothiazide]   ?  gout  ? Morphine And Related Itching  ? Lotrel [Amlodipine Besy-Benazepril Hcl]   ?  Facial muscles draw and break out in whilps  ? ? ?Filed Weights  ? 12/07/21 1404  ?Weight: 79.4 kg (175 lb)  ? ? ? ?  Latest Ref Rng & Units 12/07/2021  ? 12:03 PM 04/05/2021  ?  1:13 PM 02/02/2021  ? 12:55 PM  ?CBC  ?WBC 4.0 - 10.5 K/uL 16.4   6.3   11.0    ?Hemoglobin 12.0 - 15.0 g/dL 10.3   11.8   10.6    ?Hematocrit 36.0 - 46.0 % 32.9   37.4   33.4    ?Platelets 150 - 400 K/uL 323   215   276    ? ? ? ?Estimated Creatinine Clearance: 10.8 mL/min (A) (by C-G formula based on SCr of 3.5 mg/dL (H)). ? ?Antibiotics Given (last 72 hours)   ? ? None  ? ?  ? ? ?Antimicrobials this admission: ? ?ciprofloxacin 12/07/2021  >>  ? ?Microbiology results: ?12/07/2021  Resp Panel: negative   ? ? ?Thank you for allowing pharmacy to be a part of this patient?s care. ? ?Thomasenia Sales, PharmD ?Clinical Pharmacist ? ? ?

## 2021-12-07 NOTE — ED Notes (Signed)
Upon assessment patient O2 sats 71% on 2L [place]. Patient placed on 15 L non rebreather. O2 sats increased to 98%. RT called and recommended to place patient on 5L [place]. Patient placed on 5L [place]. Patients O2 sats 95% at this time.  ?

## 2021-12-07 NOTE — H&P (Signed)
?History and Physical  ? ? ?Paige Rhodes HKV:[id number] DOB: 1932/08/30 DOA: 12/07/2021 ? ?PCP: [name], MD  ? ?Chief Complaint: Urinary frequency/hesitancy/urgency ? ?HPI: Paige Rhodes is a 88 y.o. female with medical history significant of COPD on 2.5L oxygen around the clock, NIDDM2, HLD, HTN, Hypothyroidism, osteoporosis, CKD2, GERD, and L infiltrating ductal carcinoma of the left breast followed by Dr Chryl Heck. Patient presents with worsening fatigue/weakness with increased oxygen needs and increased urinary frequency. ? ?Review of Systems: As per HPI shortness of breath and urinary frequency  ? ?Assessment/Plan ?Principal Problem: ?  UTI (urinary tract infection) ?Active Problems: ?  Infiltrating ductal carcinoma of left breast ?  HLD (hyperlipidemia) ?  Chronic obstructive pulmonary disease (HCC) ?  CAP (community acquired pneumonia) ?  Pneumonia ?  ?Acute on chronic hypoxia ?Likely COPD exacerbation ?Rule out community acquired pneumonia ?-Continue supportive care, steroids, nebs, Breo ?-Questionable concurrent community-acquired pneumonia given right upper lobe opacity on x-ray, continue Levaquin x5 days ?-Of note patient just completed 5-day course of Cipro in the outpatient setting for presumed UTI, has anaphylactic penicillin allergy ? ?AKI on CKD 2  ?-Continue IV fluids overnight ?-Increase p.o. intake as appropriate ?-Baseline creatinine around 1, currently 3.5 at intake ? ?Rrule out recurrent UTI ?-Patient does not meet sepsis criteria -leukocytosis only ?-UA abnormal in the setting of recent UTI, no symptoms at this time, continue to follow clinically, on Levaquin as above completed 5 days of Cipro last week and single dose of Cipro in the ED today ? ?NIDDM ?-Appears to be diet controlled at home, not on any insulin or diabetic medications ?-Continue low-carb diet follow clinically ? ?HTN -continue home amlodipine, carvedilol, losartan ?HLD-continue home statin ?Hypothyroidism-continue  levothyroxine ?L breast cancer-follow-up outpatient as scheduled -does not appear to be actively treated at this time ? ?DVT prophylaxis: Heparin ?Code Status: Full ?Family Communication: None present ?Status is: Inpatient ? ?Dispo: The patient is from: Home ?             Anticipated d/c is to: To be determined ?             Anticipated d/c date is: 48 to 72 hours ?             Patient currently not medically stable for discharge given ongoing hypoxia and respiratory symptoms as above ? ?Consultants:  ?None ? ?Procedures:  ?None ? ? ?Past Medical History:  ?Diagnosis Date  ? Breast cancer ([place])   ? Chronic renal disease, stage 2, mildly decreased glomerular filtration rate between 60-89 mL/min/1.73 square meter 10/31/2014  ? COPD (chronic obstructive pulmonary disease) ([place])   ? Diabetes mellitus   ? GERD (gastroesophageal reflux disease)   ? occasional  ? Glaucoma   ? Gout attack june 2013  ? left foot  ? Hard of hearing   ? Hyperlipidemia   ? Hypertension   ? Hypothyroidism   ? Infiltrating ductal carcinoma of left breast 03/05/2011  ? On home oxygen therapy   ? 2 LPM  ? Osteoporosis   ? Port catheter in place 01/15/2012  ? SOB (shortness of breath)   ? Thyroid disease   ? ? ?Past Surgical History:  ?Procedure Laterality Date  ? [place]  ? APPENDECTOMY  1955  ? BREAST LUMPECTOMY  2011  ? left  ? CATARACT EXTRACTION W/PHACO  09/03/2012  ? Procedure: CATARACT EXTRACTION PHACO AND INTRAOCULAR LENS PLACEMENT (IOC);  Surgeon: Tonny Branch, MD;  Location: [facility];  Service:  Ophthalmology;  Laterality: Right;  CDE: 15.26  ? CATARACT EXTRACTION W/PHACO  09/28/2012  ? Procedure: CATARACT EXTRACTION PHACO AND INTRAOCULAR LENS PLACEMENT (IOC);  Surgeon: Tonny Branch, MD;  Location: [facility];  Service: Ophthalmology;  Laterality: Left;  CDE:  21.60  ? CHOLECYSTECTOMY  1990  ? OPEN REDUCTION INTERNAL FIXATION (ORIF) DISTAL RADIAL FRACTURE Left 07/01/2019  ? Procedure: OPEN REDUCTION INTERNAL FIXATION (ORIF) LEFT DISTAL RADIAL  FRACTURE;  Surgeon: Carole Civil, MD;  Location: [facility];  Service: Orthopedics;  Laterality: Left;  ? PORT-A-CATH REMOVAL N/A 04/26/2015  ? Procedure: REMOVAL PORT-A-CATH;  Surgeon: Aviva Signs, MD;  Location: [facility];  Service: General;  Laterality: N/A;  ? PORTACATH PLACEMENT  05/23/10  ? THYROID SURGERY  1994  ? TONSILLECTOMY    ? TOTAL HIP ARTHROPLASTY  1996  ? ? ? reports that she quit smoking about 30 years ago. Her smoking use included cigarettes. She has a 90.00 pack-year smoking history. She has never used smokeless tobacco. She reports that she does not drink alcohol and does not use drugs. ? ?Allergies  ?Allergen Reactions  ? Penicillins Anaphylaxis  ?  Edema of throat  ? Hctz [Hydrochlorothiazide] Other (See Comments)  ?  gout  ? Morphine And Related Itching  ? Lotrel [Amlodipine Besy-Benazepril Hcl] Other (See Comments)  ?  Facial muscles draw and break out in whilps  ? ? ?Family History  ?Problem Relation Age of Onset  ? Kidney disease Mother   ? Heart disease Father   ? Hypertension Brother   ? Cancer Sister   ?     breast and lung  ? ? ?Prior to Admission medications   ?Medication Sig Start Date End Date Taking? Authorizing Provider  ?albuterol (VENTOLIN HFA) 108 (90 Base) MCG/ACT inhaler Inhale 2 puffs into the lungs every 6 (six) hours as needed for wheezing or shortness of breath. 12/21/20   [provider]  ?amLODipine (NORVASC) 5 MG tablet Take 5 mg by mouth daily.     [provider]  ?aspirin EC 81 MG tablet Take 81 mg by mouth daily.    [provider]  ?atorvastatin (LIPITOR) 10 MG tablet Take 10 mg by mouth daily. 08/18/19   [provider]  ?Calcium-Magnesium-Vitamin D (CALCIUM 500 PO) Take 1 tablet by mouth daily.    [provider]  ?carvedilol (COREG) 25 MG tablet Take 25 mg by mouth 2 (two) times daily with a meal.      [provider]  ?Cholecalciferol (VITAMIN D-3) 1000 units CAPS Take 1,000 Units by mouth daily.    [provider]  ?ciprofloxacin (CIPRO) 250 MG tablet Take 250 mg by mouth 2 (two) times daily. For 5 days 12/06/21   [provider]  ?diphenhydrAMINE HCl, Sleep, 50 MG CAPS Take 50 mg by mouth at bedtime.    [provider]  ?levothyroxine (SYNTHROID, LEVOTHROID) 125 MCG tablet Take 125 mcg by mouth daily.      [provider]  ?losartan (COZAAR) 50 MG tablet Take 50 mg by mouth daily.      [provider]  ?SPIRIVA HANDIHALER 18 MCG inhalation capsule Place 18 mcg into inhaler and inhale daily. 10/02/15   [provider]  ? ? ?Physical Exam: ?Vitals:  ? 12/07/21 1420 12/07/21 1425 12/07/21 1435 12/07/21 1440  ?BP:      ?Pulse: 71 70 68 70  ?Resp: 17 (!) '21 18 13  '$ ?Temp:      ?TempSrc:      ?  SpO2: 94% 90% 99% 99%  ?Weight:      ?Height:      ? ? ?Constitutional: NAD, calm, comfortable ?Vitals:  ? 12/07/21 1420 12/07/21 1425 12/07/21 1435 12/07/21 1440  ?BP:      ?Pulse: 71 70 68 70  ?Resp: 17 (!) '21 18 13  '$ ?Temp:      ?TempSrc:      ?SpO2: 94% 90% 99% 99%  ?Weight:      ?Height:      ? ?General:  Pleasantly resting in bed, No acute distress. ?HEENT:  Normocephalic atraumatic.  Sclerae nonicteric, noninjected.  Extraocular movements intact bilaterally. ?Neck:  Without mass or deformity.  Trachea is midline. ?Lungs: End expiratory wheeze apically, right > left ?Heart:  Regular rate and rhythm.  Without murmurs, rubs, or gallops. ?Abdomen:  Soft, nontender, nondistended.  Without guarding or rebound. ?Extremities: Without cyanosis, clubbing, edema, or obvious deformity. ?Vascular:  Dorsalis pedis and posterior tibial pulses palpable bilaterally. ?Skin:  Warm and dry, no erythema, no ulcerations. ? ?Labs on Admission: I have personally reviewed following labs and imaging studies ? ?CBC: ?Recent Labs  ?Lab 12/07/21 ?1203  ?WBC 16.4*  ?HGB 10.3*  ?HCT 32.9*  ?MCV 91.9  ?PLT 323  ? ?Basic Metabolic Panel: ?Recent Labs  ?Lab 12/07/21 ?1203  ?NA 138  ?K 4.3  ?CL 98  ?CO2 28   ?GLUCOSE 143*  ?BUN 108*  ?CREATININE 3.50*  ?CALCIUM 11.6*  ? ?GFR: ?Estimated Creatinine Clearance: 10.8 mL/min (A) (by C-G formula based on SCr of 3.5 mg/dL (H)). ?Liver Function Tests: ?No results for input(s): AST, AL

## 2021-12-08 LAB — COMPREHENSIVE METABOLIC PANEL
ALT: 26 U/L (ref 0–44)
AST: 19 U/L (ref 15–41)
Albumin: 2.4 g/dL — ABNORMAL LOW (ref 3.5–5.0)
Alkaline Phosphatase: 72 U/L (ref 38–126)
Anion gap: 10 (ref 5–15)
BUN: 100 mg/dL — ABNORMAL HIGH (ref 8–23)
CO2: 28 mmol/L (ref 22–32)
Calcium: 10.4 mg/dL — ABNORMAL HIGH (ref 8.9–10.3)
Chloride: 102 mmol/L (ref 98–111)
Creatinine, Ser: 2.71 mg/dL — ABNORMAL HIGH (ref 0.44–1.00)
GFR, Estimated: 16 mL/min — ABNORMAL LOW (ref >60)
Glucose, Bld: 194 mg/dL — ABNORMAL HIGH (ref 70–99)
Potassium: 3.7 mmol/L (ref 3.5–5.1)
Sodium: 140 mmol/L (ref 135–145)
Total Bilirubin: 0.5 mg/dL (ref 0.3–1.2)
Total Protein: 6.7 g/dL (ref 6.5–8.1)

## 2021-12-08 LAB — CBC
HCT: 31 % — ABNORMAL LOW (ref 36.0–46.0)
Hemoglobin: 9.5 g/dL — ABNORMAL LOW (ref 12.0–15.0)
MCH: 28.4 pg (ref 26.0–34.0)
MCHC: 30.6 g/dL (ref 30.0–36.0)
MCV: 92.8 fL (ref 80.0–100.0)
Platelets: 306 10*3/uL (ref 150–400)
RBC: 3.34 MIL/uL — ABNORMAL LOW (ref 3.87–5.11)
RDW: 14.8 % (ref 11.5–15.5)
WBC: 14.6 10*3/uL — ABNORMAL HIGH (ref 4.0–10.5)
nRBC: 0 % (ref 0.0–0.2)

## 2021-12-08 MED ORDER — LEVOFLOXACIN IN D5W 500 MG/100ML IV SOLN
500.0000 mg | INTRAVENOUS | Status: DC
  Filled 2021-12-08: qty 100

## 2021-12-08 MED ORDER — POLYETHYLENE GLYCOL 3350 17 G PO PACK
17.0000 g | PACK | Freq: Two times a day (BID) | ORAL | Status: DC
  Administered 2021-12-08 – 2021-12-09 (×3): 17 g via ORAL
  Filled 2021-12-08 (×4): qty 1

## 2021-12-08 MED ORDER — LEVOFLOXACIN IN D5W 750 MG/150ML IV SOLN
750.0000 mg | Freq: Once | INTRAVENOUS | Status: AC
  Administered 2021-12-08: 750 mg via INTRAVENOUS
  Filled 2021-12-08: qty 150

## 2021-12-08 NOTE — TOC Initial Note (Signed)
?Transition of Care (TOC) Screening Note ? ? ?Patient Details  ?Name: Paige Rhodes ?Date of Birth: June 27, 1932 ? ? ?Transition of Care (TOC) CM/SW Contact:    ?Kerin Salen, RN ?Phone Number: ?12/08/2021, 11:43 AM ? ? ? ?Transition of Care Department [facility]) has reviewed patient and no TOC needs have been identified at this time. We will continue to monitor patient advancement through interdisciplinary progression rounds. If new patient transition needs arise, please place a TOC consult. ? ?           ? ? ?  ?  ? ? ?Patient Goals and CMS Choice ?  ?  ?  ? ?Expected Discharge Plan and Services ?  ?  ?  ?  ?  ?                ?  ?  ?  ?  ?  ?  ?  ?  ?  ?  ? ?Prior Living Arrangements/Services ?  ?  ?  ?       ?  ?  ?  ?  ? ?Activities of Daily Living ?Home Assistive Devices/Equipment: Wheelchair, Environmental consultant (specify type), Oxygen, Eyeglasses, Hearing aid ?ADL Screening (condition at time of admission) ?Patient's cognitive ability adequate to safely complete daily activities?: Yes ?Is the patient deaf or have difficulty hearing?: No ?Does the patient have difficulty seeing, even when wearing glasses/contacts?: No ?Does the patient have difficulty concentrating, remembering, or making decisions?: No ?Patient able to express need for assistance with ADLs?: Yes ?Does the patient have difficulty dressing or bathing?: Yes ?Independently performs ADLs?: No ?Communication: Independent ?Dressing (OT): Needs assistance ?Is this a change from baseline?: Change from baseline, expected to last <3days ?Grooming: Needs assistance ?Is this a change from baseline?: Change from baseline, expected to last <3 days ?Feeding: Independent ?Bathing: Needs assistance ?Is this a change from baseline?: Change from baseline, expected to last <3 days ?Toileting: Needs assistance ?Is this a change from baseline?: Change from baseline, expected to last <3 days ?In/Out Bed: Needs assistance ?Is this a change from baseline?: Change from baseline,  expected to last <3 days ?Walks in Home: Needs assistance ?Is this a change from baseline?: Change from baseline, expected to last <3 days ?Does the patient have difficulty walking or climbing stairs?: Yes ?Weakness of Legs: Both ?Weakness of Arms/Hands: None ? ?Permission Sought/Granted ?  ?  ?   ?   ?   ?   ? ?Emotional Assessment ?  ?  ?  ?  ?  ?  ? ?Admission diagnosis:  Lung mass [R91.8] ?UTI (urinary tract infection) [N39.0] ?Pneumonia [J18.9] ?Acute cystitis without hematuria [N30.00] ?AKI (acute kidney injury) ([place]) [N17.9] ?Patient Active Problem List  ? Diagnosis Date Noted  ? UTI (urinary tract infection) 12/07/2021  ? Pneumonia 12/07/2021  ? Colles' fracture of left radius, initial encounter for closed fracture   ? CAP (community acquired pneumonia) 09/02/2017  ? Elevated troponin 09/02/2017  ? Anemia 09/02/2017  ? Hypomagnesemia 09/02/2017  ? Chronic renal disease, stage 2, mildly decreased glomerular filtration rate between 60-89 mL/min/1.73 square meter 10/31/2014  ? Port catheter in place 01/15/2012  ? Controlled type 2 diabetes mellitus without complication ([place]) 54/62/7035  ? Abnormal presence of protein in urine 01/15/2012  ? Adult hypothyroidism 01/15/2012  ? Hyperparathyroidism ([place]) 01/15/2012  ? HLD (hyperlipidemia) 01/15/2012  ? Gout 01/15/2012  ? Essential (primary) hypertension 01/15/2012  ? Chronic obstructive pulmonary disease ([place]) 01/15/2012  ? OP (osteoporosis) 01/15/2012  ?  Infiltrating ductal carcinoma of left breast 03/05/2011  ? Overlapping malignant neoplasm of female breast ([place]) 03/08/2010  ? ?PCP:  [name], MD ?Pharmacy:   ?[facility] ?[place] [address] ?Phone: [phone] Fax: [phone] ? ? ? ? ?Social Determinants of Health (SDOH) Interventions ?  ? ?Readmission Risk Interventions ?   ? View : No data to display.  ?  ?  ?  ? ? ? ?

## 2021-12-08 NOTE — Progress Notes (Signed)
?PROGRESS NOTE ? ? ? ?Paige Rhodes  WER:[id number] DOB: 24-Jul-1932 DOA: 12/07/2021 ?PCP: [name], MD ? ? ?Brief Narrative:  ?Paige Rhodes is a 88 y.o. female with medical history significant of COPD on 2.5L oxygen around the clock, NIDDM2, HLD, HTN, Hypothyroidism, osteoporosis, CKD2, GERD, and L infiltrating ductal carcinoma of the left breast followed by Dr Chryl Heck. Patient presents with worsening fatigue/weakness with increased oxygen needs and questionably increased urinary frequency. ? ? ?Assessment & Plan: ?  ?Principal Problem: ?  UTI (urinary tract infection) ?Active Problems: ?  Infiltrating ductal carcinoma of left breast ?  HLD (hyperlipidemia) ?  Chronic obstructive pulmonary disease (HCC) ?  CAP (community acquired pneumonia) ?  Pneumonia ? ?Acute on chronic hypoxia ?Acute COPD exacerbation ?Community acquired pneumonia ruled out ?-Baseline oxygen 2.5 L around-the-clock -still requiring upwards of 5 L nasal cannula even at rest, continue daily ambulatory oxygen screens ?-Continue supportive care, steroids, nebs, Breo ?-Questionable right upper lobe opacity on x-ray, not noted on CT - continue Levaquin x5 days ?-Of note patient just completed 5-day course of Cipro in the outpatient setting for presumed UTI, has anaphylactic penicillin allergy ?  ?AKI on CKD 2, resolving-Continue IV fluids overnight ?-Increase p.o. intake as appropriate ?-Baseline creatinine around 1, currently 3.5 at intake ?  ?Acute recurrent UTI ruled out ?-Patient does not meet sepsis criteria -leukocytosis only ?-UA abnormal in the setting of recent UTI, no symptoms at this time ?  ?NIDDM ?-Appears to be diet controlled at home, not on any insulin or diabetic medications ?-Continue low-carb diet follow clinically ?  ?HTN -continue home amlodipine, carvedilol, losartan ?HLD-continue home statin ?Hypothyroidism-continue levothyroxine ?L breast cancer-follow-up outpatient as scheduled -does not appear to be actively treated at this  time ?  ?DVT prophylaxis: Heparin ?Code Status: Full ?Family Communication: Daughter and granddaughter at bedside ? ?Status is: Inpatient ? ?Dispo: The patient is from: Home ?             Anticipated d/c is to: Home ?             Anticipated d/c date is: 24 to 48 hours ?             Patient currently not medically stable for discharge ? ?Consultants:  ?None ? ?Procedures:  ?None ? ?Antimicrobials:  ?Levaquin x5 days ? ?Subjective: ?No acute issues or events overnight, generally improving still somewhat weak and fatigued with ambulation but respiratory status minimally improving ? ?Objective: ?Vitals:  ? 12/07/21 2004 12/07/21 2053 12/07/21 2300 12/08/21 0300  ?BP:  136/63 132/62 (!) 137/59  ?Pulse:  68 68 66  ?Resp:  '18 17 18  '$ ?Temp:  97.8 ?F (36.6 ?C) 97.8 ?F (36.6 ?C) 97.7 ?F (36.5 ?C)  ?TempSrc:  Oral Oral Oral  ?SpO2: 97% 93% 90% 95%  ?Weight:      ?Height:      ? ? ?Intake/Output Summary (Last 24 hours) at 12/08/2021 0719 ?Last data filed at 12/08/2021 0600 ?Gross per 24 hour  ?Intake 2260.79 ml  ?Output 13 ml  ?Net 2247.79 ml  ? ?Filed Weights  ? 12/07/21 1404  ?Weight: 79.4 kg  ? ? ?Examination: ? ?General exam: Appears calm and comfortable  ?Respiratory system: Clear to auscultation. Respiratory effort normal. ?Cardiovascular system: S1 & S2 heard, RRR. No JVD, murmurs, rubs, gallops or clicks. No pedal edema. ?Gastrointestinal system: Abdomen is nondistended, soft and nontender. No organomegaly or masses felt. Normal bowel sounds heard. ?Central nervous system: Alert and oriented. No focal  neurological deficits. ?Extremities: Symmetric 5 x 5 power. ?Skin: No rashes, lesions or ulcers ?Psychiatry: Judgement and insight appear normal. Mood & affect appropriate.  ? ? ? ?Data Reviewed: I have personally reviewed following labs and imaging studies ? ?CBC: ?Recent Labs  ?Lab 12/07/21 ?1203 12/08/21 ?0324  ?WBC 16.4* 14.6*  ?HGB 10.3* 9.5*  ?HCT 32.9* 31.0*  ?MCV 91.9 92.8  ?PLT 323 306  ? ?Basic Metabolic  Panel: ?Recent Labs  ?Lab 12/07/21 ?1203  ?NA 138  ?K 4.3  ?CL 98  ?CO2 28  ?GLUCOSE 143*  ?BUN 108*  ?CREATININE 3.50*  ?CALCIUM 11.6*  ? ?GFR: ?Estimated Creatinine Clearance: 10.8 mL/min (A) (by C-G formula based on SCr of 3.5 mg/dL (H)). ?Liver Function Tests: ?No results for input(s): AST, ALT, ALKPHOS, BILITOT, PROT, ALBUMIN in the last 168 hours. ?No results for input(s): LIPASE, AMYLASE in the last 168 hours. ?No results for input(s): AMMONIA in the last 168 hours. ?Coagulation Profile: ?No results for input(s): INR, PROTIME in the last 168 hours. ?Cardiac Enzymes: ?No results for input(s): CKTOTAL, CKMB, CKMBINDEX, TROPONINI in the last 168 hours. ?BNP (last 3 results) ?No results for input(s): PROBNP in the last 8760 hours. ?HbA1C: ?No results for input(s): HGBA1C in the last 72 hours. ?CBG: ?No results for input(s): GLUCAP in the last 168 hours. ?Lipid Profile: ?No results for input(s): CHOL, HDL, LDLCALC, TRIG, CHOLHDL, LDLDIRECT in the last 72 hours. ?Thyroid Function Tests: ?No results for input(s): TSH, T4TOTAL, FREET4, T3FREE, THYROIDAB in the last 72 hours. ?Anemia Panel: ?No results for input(s): VITAMINB12, FOLATE, FERRITIN, TIBC, IRON, RETICCTPCT in the last 72 hours. ?Sepsis Labs: ?No results for input(s): PROCALCITON, LATICACIDVEN in the last 168 hours. ? ?Recent Results (from the past 240 hour(s))  ?Resp Panel by RT-PCR (Flu A&B, Covid) Nasopharyngeal Swab     Status: None  ? Collection Time: 12/07/21 12:20 PM  ? Specimen: Nasopharyngeal Swab; Nasopharyngeal(NP) swabs in vial transport medium  ?Result Value Ref Range Status  ? SARS Coronavirus 2 by RT PCR NEGATIVE NEGATIVE Final  ?  Comment: (NOTE) ?SARS-CoV-2 target nucleic acids are NOT DETECTED. ? ?The SARS-CoV-2 RNA is generally detectable in upper respiratory ?specimens during the acute phase of infection. The lowest ?concentration of SARS-CoV-2 viral copies this assay can detect is ?138 copies/mL. A negative result does not preclude  SARS-Cov-2 ?infection and should not be used as the sole basis for treatment or ?other patient management decisions. A negative result may occur with  ?improper specimen collection/handling, submission of specimen other ?than nasopharyngeal swab, presence of viral mutation(s) within the ?areas targeted by this assay, and inadequate number of viral ?copies(<138 copies/mL). A negative result must be combined with ?clinical observations, patient history, and epidemiological ?information. The expected result is Negative. ? ?Fact Sheet for Patients:  ?EntrepreneurPulse.com.au ? ?Fact Sheet for Healthcare Providers:  ?IncredibleEmployment.be ? ?This test is no t yet approved or cleared by the [place] FDA and  ?has been authorized for detection and/or diagnosis of SARS-CoV-2 by ?FDA under an Emergency Use Authorization (EUA). This EUA will remain  ?in effect (meaning this test can be used) for the duration of the ?COVID-19 declaration under Section 564(b)(1) of the Act, 21 ?U.S.C.section 360bbb-3(b)(1), unless the authorization is terminated  ?or revoked sooner.  ? ? ?  ? Influenza A by PCR NEGATIVE NEGATIVE Final  ? Influenza B by PCR NEGATIVE NEGATIVE Final  ?  Comment: (NOTE) ?The Xpert Xpress SARS-CoV-2/FLU/RSV plus assay is intended as an aid ?in the diagnosis  of influenza from Nasopharyngeal swab specimens and ?should not be used as a sole basis for treatment. Nasal washings and ?aspirates are unacceptable for Xpert Xpress SARS-CoV-2/FLU/RSV ?testing. ? ?Fact Sheet for Patients: ?EntrepreneurPulse.com.au ? ?Fact Sheet for Healthcare Providers: ?IncredibleEmployment.be ? ?This test is not yet approved or cleared by the [place] FDA and ?has been authorized for detection and/or diagnosis of SARS-CoV-2 by ?FDA under an Emergency Use Authorization (EUA). This EUA will remain ?in effect (meaning this test can be used) for the duration of  the ?COVID-19 declaration under Section 564(b)(1) of the Act, 21 U.S.C. ?section 360bbb-3(b)(1), unless the authorization is terminated or ?revoked. ? ?Performed at [facility], [address], [place], [place] [address]

## 2021-12-09 LAB — CBC
HCT: 29 % — ABNORMAL LOW (ref 36.0–46.0)
Hemoglobin: 9 g/dL — ABNORMAL LOW (ref 12.0–15.0)
MCH: 29.2 pg (ref 26.0–34.0)
MCHC: 31 g/dL (ref 30.0–36.0)
MCV: 94.2 fL (ref 80.0–100.0)
Platelets: 347 10*3/uL (ref 150–400)
RBC: 3.08 MIL/uL — ABNORMAL LOW (ref 3.87–5.11)
RDW: 15 % (ref 11.5–15.5)
WBC: 19.9 10*3/uL — ABNORMAL HIGH (ref 4.0–10.5)
nRBC: 0 % (ref 0.0–0.2)

## 2021-12-09 LAB — BASIC METABOLIC PANEL
Anion gap: 9 (ref 5–15)
BUN: 99 mg/dL — ABNORMAL HIGH (ref 8–23)
CO2: 28 mmol/L (ref 22–32)
Calcium: 9.8 mg/dL (ref 8.9–10.3)
Chloride: 103 mmol/L (ref 98–111)
Creatinine, Ser: 2.43 mg/dL — ABNORMAL HIGH (ref 0.44–1.00)
GFR, Estimated: 19 mL/min — ABNORMAL LOW (ref >60)
Glucose, Bld: 212 mg/dL — ABNORMAL HIGH (ref 70–99)
Potassium: 3.8 mmol/L (ref 3.5–5.1)
Sodium: 140 mmol/L (ref 135–145)

## 2021-12-09 NOTE — Progress Notes (Signed)
?PROGRESS NOTE ? ? ? ?Paige Rhodes  VVO:[id number] DOB: [birth year] DOA: 12/07/2021 ?PCP: [name], MD ? ? ?Brief Narrative:  ?Paige Rhodes is a 88 y.o. female with medical history significant of COPD on 2.5L oxygen around the clock, NIDDM2, HLD, HTN, Hypothyroidism, osteoporosis, CKD2, GERD, and L infiltrating ductal carcinoma of the left breast followed by Dr Chryl Heck. Patient presents with worsening fatigue/weakness with increased oxygen needs and questionably increased urinary frequency. ? ? ?Assessment & Plan: ?  ?Active Problems: ?  Infiltrating ductal carcinoma of left breast ?  HLD (hyperlipidemia) ?  Chronic obstructive pulmonary disease (HCC) ? ?Acute on chronic hypoxia ?Acute COPD exacerbation ?Community acquired pneumonia ruled out ?-Baseline oxygen 2.5 L around-the-clock -still requiring upwards of 4L today at rest ?-Ambulatory screen pending ?-Continue supportive care, steroids, nebs, Breo ?-Questionable right upper lobe opacity on x-ray, not noted on CT - continue Levaquin x5 days for coverage of COPD ?-Of note patient just completed 5-day course of Cipro in the outpatient setting for presumed UTI, has anaphylactic penicillin allergy ?  ?AKI on CKD 2, resolving-Continue IV fluids overnight ?-Increase p.o. intake as appropriate ?-Baseline creatinine around 1, currently 3.5 at intake ?  ?Acute recurrent UTI ruled out ?-Patient does not meet sepsis criteria -leukocytosis only ?-UA abnormal in the setting of recent UTI, no symptoms at this time ?  ?NIDDM ?-Appears to be diet controlled at home, not on any insulin or diabetic medications ?-Continue low-carb diet follow clinically ?  ?HTN -continue home amlodipine, carvedilol, losartan ?HLD-continue home statin ?Hypothyroidism-continue levothyroxine ?L breast cancer-follow-up outpatient as scheduled -does not appear to be actively treated at this time ?  ?DVT prophylaxis: Heparin ?Code Status: Full ?Family Communication: None present ? ?Status is:  Inpatient ? ?Dispo: The patient is from: Home ?             Anticipated d/c is to: Home ?             Anticipated d/c date is: 24 to 48 hours ?             Patient currently not medically stable for discharge ? ?Consultants:  ?None ? ?Procedures:  ?None ? ?Antimicrobials:  ?Levaquin x5 days ? ?Subjective: ?No acute issues or events overnight, generally improving - remains weak/dyspneic even with minimal exertion ? ?Objective: ?Vitals:  ? 12/08/21 0809 12/08/21 1316 12/08/21 2044 12/09/21 0428  ?BP:  (!) 130/54 (!) 132/59 (!) 133/56  ?Pulse:  60 (!) 59 61  ?Resp:  '18 18 18  '$ ?Temp:  97.6 ?F (36.4 ?C) 98.1 ?F (36.7 ?C) 98.5 ?F (36.9 ?C)  ?TempSrc:  Oral Oral   ?SpO2: 95% 96% 97% 96%  ?Weight:      ?Height:      ? ? ?Intake/Output Summary (Last 24 hours) at 12/09/2021 0747 ?Last data filed at 12/08/2021 2694 ?Gross per 24 hour  ?Intake 1504.39 ml  ?Output --  ?Net 1504.39 ml  ? ? ?Filed Weights  ? 12/07/21 1404  ?Weight: 79.4 kg  ? ? ?Examination: ? ?General exam: Appears calm and comfortable  ?Respiratory system: Clear to auscultation. Respiratory effort normal. ?Cardiovascular system: S1 & S2 heard, RRR. No JVD, murmurs, rubs, gallops or clicks. No pedal edema. ?Gastrointestinal system: Abdomen is nondistended, soft and nontender. No organomegaly or masses felt. Normal bowel sounds heard. ?Central nervous system: Alert and oriented. No focal neurological deficits. ?Extremities: Symmetric 5 x 5 power. ?Skin: No rashes, lesions or ulcers ?Psychiatry: Judgement and insight appear normal. Mood & affect  appropriate.  ? ? ? ?Data Reviewed: I have personally reviewed following labs and imaging studies ? ?CBC: ?Recent Labs  ?Lab 12/07/21 ?1203 12/08/21 ?0324 12/09/21 ?0416  ?WBC 16.4* 14.6* 19.9*  ?HGB 10.3* 9.5* 9.0*  ?HCT 32.9* 31.0* 29.0*  ?MCV 91.9 92.8 94.2  ?PLT 323 306 347  ? ? ?Basic Metabolic Panel: ?Recent Labs  ?Lab 12/07/21 ?1203 12/08/21 ?0716 12/09/21 ?0416  ?NA 138 140 140  ?K 4.3 3.7 3.8  ?CL 98 102 103  ?CO2 '28  28 28  '$ ?GLUCOSE 143* 194* 212*  ?BUN 108* 100* 99*  ?CREATININE 3.50* 2.71* 2.43*  ?CALCIUM 11.6* 10.4* 9.8  ? ? ?GFR: ?Estimated Creatinine Clearance: 15.6 mL/min (A) (by C-G formula based on SCr of 2.43 mg/dL (H)). ?Liver Function Tests: ?Recent Labs  ?Lab 12/08/21 ?0716  ?AST 19  ?ALT 26  ?ALKPHOS 72  ?BILITOT 0.5  ?PROT 6.7  ?ALBUMIN 2.4*  ? ?No results for input(s): LIPASE, AMYLASE in the last 168 hours. ?No results for input(s): AMMONIA in the last 168 hours. ?Coagulation Profile: ?No results for input(s): INR, PROTIME in the last 168 hours. ?Cardiac Enzymes: ?No results for input(s): CKTOTAL, CKMB, CKMBINDEX, TROPONINI in the last 168 hours. ?BNP (last 3 results) ?No results for input(s): PROBNP in the last 8760 hours. ?HbA1C: ?No results for input(s): HGBA1C in the last 72 hours. ?CBG: ?No results for input(s): GLUCAP in the last 168 hours. ?Lipid Profile: ?No results for input(s): CHOL, HDL, LDLCALC, TRIG, CHOLHDL, LDLDIRECT in the last 72 hours. ?Thyroid Function Tests: ?No results for input(s): TSH, T4TOTAL, FREET4, T3FREE, THYROIDAB in the last 72 hours. ?Anemia Panel: ?No results for input(s): VITAMINB12, FOLATE, FERRITIN, TIBC, IRON, RETICCTPCT in the last 72 hours. ?Sepsis Labs: ?No results for input(s): PROCALCITON, LATICACIDVEN in the last 168 hours. ? ?Recent Results (from the past 240 hour(s))  ?Resp Panel by RT-PCR (Flu A&B, Covid) Nasopharyngeal Swab     Status: None  ? Collection Time: 12/07/21 12:20 PM  ? Specimen: Nasopharyngeal Swab; Nasopharyngeal(NP) swabs in vial transport medium  ?Result Value Ref Range Status  ? SARS Coronavirus 2 by RT PCR NEGATIVE NEGATIVE Final  ?  Comment: (NOTE) ?SARS-CoV-2 target nucleic acids are NOT DETECTED. ? ?The SARS-CoV-2 RNA is generally detectable in upper respiratory ?specimens during the acute phase of infection. The lowest ?concentration of SARS-CoV-2 viral copies this assay can detect is ?138 copies/mL. A negative result does not preclude  SARS-Cov-2 ?infection and should not be used as the sole basis for treatment or ?other patient management decisions. A negative result may occur with  ?improper specimen collection/handling, submission of specimen other ?than nasopharyngeal swab, presence of viral mutation(s) within the ?areas targeted by this assay, and inadequate number of viral ?copies(<138 copies/mL). A negative result must be combined with ?clinical observations, patient history, and epidemiological ?information. The expected result is Negative. ? ?Fact Sheet for Patients:  ?EntrepreneurPulse.com.au ? ?Fact Sheet for Healthcare Providers:  ?IncredibleEmployment.be ? ?This test is no t yet approved or cleared by the [place] FDA and  ?has been authorized for detection and/or diagnosis of SARS-CoV-2 by ?FDA under an Emergency Use Authorization (EUA). This EUA will remain  ?in effect (meaning this test can be used) for the duration of the ?COVID-19 declaration under Section 564(b)(1) of the Act, 21 ?U.S.C.section 360bbb-3(b)(1), unless the authorization is terminated  ?or revoked sooner.  ? ? ?  ? Influenza A by PCR NEGATIVE NEGATIVE Final  ? Influenza B by PCR NEGATIVE NEGATIVE Final  ?  Comment: (NOTE) ?The Xpert Xpress SARS-CoV-2/FLU/RSV plus assay is intended as an aid ?in the diagnosis of influenza from Nasopharyngeal swab specimens and ?should not be used as a sole basis for treatment. Nasal washings and ?aspirates are unacceptable for Xpert Xpress SARS-CoV-2/FLU/RSV ?testing. ? ?Fact Sheet for Patients: ?EntrepreneurPulse.com.au ? ?Fact Sheet for Healthcare Providers: ?IncredibleEmployment.be ? ?This test is not yet approved or cleared by the [place] FDA and ?has been authorized for detection and/or diagnosis of SARS-CoV-2 by ?FDA under an Emergency Use Authorization (EUA). This EUA will remain ?in effect (meaning this test can be used) for the duration of  the ?COVID-19 declaration under Section 564(b)(1) of the Act, 21 U.S.C. ?section 360bbb-3(b)(1), unless the authorization is terminated or ?revoked. ? ?Performed at [facility], [address] [place], [place] [address] ?  ? ?

## 2021-12-10 LAB — CBC
HCT: 31.8 % — ABNORMAL LOW (ref 36.0–46.0)
Hemoglobin: 9.8 g/dL — ABNORMAL LOW (ref 12.0–15.0)
MCH: 29 pg (ref 26.0–34.0)
MCHC: 30.8 g/dL (ref 30.0–36.0)
MCV: 94.1 fL (ref 80.0–100.0)
Platelets: 370 10*3/uL (ref 150–400)
RBC: 3.38 MIL/uL — ABNORMAL LOW (ref 3.87–5.11)
RDW: 15 % (ref 11.5–15.5)
WBC: 20.4 10*3/uL — ABNORMAL HIGH (ref 4.0–10.5)
nRBC: 0 % (ref 0.0–0.2)

## 2021-12-10 LAB — BASIC METABOLIC PANEL
Anion gap: 8 (ref 5–15)
BUN: 89 mg/dL — ABNORMAL HIGH (ref 8–23)
CO2: 28 mmol/L (ref 22–32)
Calcium: 9.4 mg/dL (ref 8.9–10.3)
Chloride: 103 mmol/L (ref 98–111)
Creatinine, Ser: 2.21 mg/dL — ABNORMAL HIGH (ref 0.44–1.00)
GFR, Estimated: 21 mL/min — ABNORMAL LOW (ref >60)
Glucose, Bld: 208 mg/dL — ABNORMAL HIGH (ref 70–99)
Potassium: 3.5 mmol/L (ref 3.5–5.1)
Sodium: 139 mmol/L (ref 135–145)

## 2021-12-10 LAB — URINE CULTURE: Culture: >=100000 — AB

## 2021-12-10 MED ORDER — IPRATROPIUM-ALBUTEROL 0.5-2.5 (3) MG/3ML IN SOLN
3.0000 mL | RESPIRATORY_TRACT | 1 refills | Status: DC | PRN
Start: 2021-12-10 — End: 2022-04-16

## 2021-12-10 MED ORDER — METHYLPREDNISOLONE 4 MG PO TBPK: ORAL_TABLET | ORAL | 0 refills | Status: DC

## 2021-12-10 MED ORDER — BREO ELLIPTA 100-25 MCG/ACT IN AEPB: 1.0000 | INHALATION_SPRAY | Freq: Every day | RESPIRATORY_TRACT | 0 refills | Status: DC

## 2021-12-10 NOTE — Discharge Summary (Signed)
Physician Discharge Summary  ?Paige Rhodes FWY:[id number] DOB: 03-11-1932 DOA: 12/07/2021 ? ?PCP: [name], MD ? ?Admit date: 12/07/2021 ?Discharge date: 12/10/2021 ? ?Admitted From: Home ?Disposition:  Home ? ?Recommendations for Outpatient Follow-up:  ?Follow up with PCP in 1-2 weeks ?Follow-up with pulmonology as discussed ? ?Home Health: PT, RN ?Equipment/Devices: No new equipment ? ?Discharge Condition: Stable ?CODE STATUS: Full ?Diet recommendation: As tolerated ? ?Brief/Interim Summary: ?Paige Rhodes is a 88 y.o. female with medical history significant of COPD on 2.5L oxygen around the clock, NIDDM2, HLD, HTN, Hypothyroidism, osteoporosis, CKD2, GERD, and L infiltrating ductal carcinoma of the left breast followed by Dr Chryl Heck. Patient presents with worsening fatigue/weakness with increased oxygen needs and questionably increased urinary frequency. ? ?Patient admitted as above with acute on chronic hypoxic respiratory failure, initially presumed to be pneumonia given atypical chest x-ray with right upper lobe opacification, however on repeat CT no pneumonias were noted, given patient's acute hypoxia and known history of COPD acute COPD exacerbation was most likely diagnosis.  Patient improved drastically on inhalers, steroids, continue steroid taper at discharge.  Patient was also on Levaquin during hospitalization to cover COPD, also notable for E. coli UTI after previous outpatient diagnosis and treatment based on UA alone.  Patient's symptoms were questionable at best.  Regardless patient will completed her antibiotic course during hospitalization was otherwise stable and agreeable for discharge home.  Close follow-up with PCP in the next 1 to 2 weeks and recommending new follow-up with pulmonology in the outpatient setting to ensure inhaler titration and improve medical management given patient's acute respiratory failure. ? ?Discharge Diagnoses:  ?Active Problems: ?  Infiltrating ductal carcinoma of left  breast ?  HLD (hyperlipidemia) ?  Chronic obstructive pulmonary disease (HCC) ? ? ? ?Discharge Instructions ? ?Discharge Instructions   ? ? Discharge patient   Complete by: As directed ?  ? Discharge disposition: 06-Home-Health Care Svc  ? Discharge patient date: 12/10/2021  ? Face-to-face encounter (required for Medicare/Medicaid patients)   Complete by: As directed ?  ? I Little Ishikawa certify that this patient is under my care and that I, or a nurse practitioner or physician's assistant working with me, had a face-to-face encounter that meets the physician face-to-face encounter requirements with this patient on 12/10/2021. The encounter with the patient was in whole, or in part for the following medical condition(s) which is the primary reason for home health care (List medical condition): Ambulatory dysfunction, COPD exacerbation with acute hypoxia  ? The encounter with the patient was in whole, or in part, for the following medical condition, which is the primary reason for home health care: Ambulatory dysfunction, COPD exacerbation with acute hypoxia  ? I certify that, based on my findings, the following services are medically necessary home health services:  Nursing ?Physical therapy  ?  ? Reason for Medically Necessary Home Health Services:  Skilled Nursing- Change/Decline in Patient Status ?Therapy- Personnel officer, Public librarian ?Therapy- Home Adaptation to Facilitate Safety  ?  ? My clinical findings support the need for the above services: Unable to leave home safely without assistance and/or assistive device  ? Further, I certify that my clinical findings support that this patient is homebound due to: Unable to leave home safely without assistance  ? Home Health   Complete by: As directed ?  ? To provide the following care/treatments:  PT ?RN  ?  ? ?  ? ?Allergies as of 12/10/2021   ? ?  Reactions  ? Penicillins Anaphylaxis  ? Edema of throat  ? Hctz [hydrochlorothiazide] Other  (See Comments)  ? gout  ? Morphine And Related Itching  ? Lotrel [amlodipine Besy-benazepril Hcl] Other (See Comments)  ? Facial muscles draw and break out in whilps  ? ?  ? ?  ?Medication List  ?  ? ?STOP taking these medications   ? ?Spiriva HandiHaler 18 MCG inhalation capsule ?Generic drug: tiotropium ?  ? ?  ? ?TAKE these medications   ? ?albuterol 108 (90 Base) MCG/ACT inhaler ?Commonly known as: VENTOLIN HFA ?Inhale 2 puffs into the lungs every 6 (six) hours as needed for wheezing or shortness of breath. ?  ?amLODipine 5 MG tablet ?Commonly known as: NORVASC ?Take 5 mg by mouth daily. ?  ?aspirin EC 81 MG tablet ?Take 81 mg by mouth daily. ?  ?atorvastatin 10 MG tablet ?Commonly known as: LIPITOR ?Take 10 mg by mouth daily. ?  ?Breo Ellipta 100-25 MCG/ACT Aepb ?Generic drug: fluticasone furoate-vilanterol ?Inhale 1 puff into the lungs daily. ?Start taking on: December 11, 2021 ?  ?CALCIUM 500 PO ?Take 1 tablet by mouth daily. ?  ?carvedilol 25 MG tablet ?Commonly known as: COREG ?Take 25 mg by mouth 2 (two) times daily with a meal. ?  ?diphenhydrAMINE HCl (Sleep) 50 MG Caps ?Take 50 mg by mouth at bedtime. ?  ?ipratropium-albuterol 0.5-2.5 (3) MG/3ML Soln ?Commonly known as: DUONEB ?Take 3 mLs by nebulization every 4 (four) hours as needed. ?  ?levothyroxine 125 MCG tablet ?Commonly known as: SYNTHROID ?Take 125 mcg by mouth daily. ?  ?losartan 50 MG tablet ?Commonly known as: COZAAR ?Take 50 mg by mouth daily. ?  ?methylPREDNISolone 4 MG Tbpk tablet ?Commonly known as: MEDROL DOSEPAK ?Taper as directed on packaging ?  ?multivitamin tablet ?Take 1 tablet by mouth daily. ?  ?VITAMIN C PO ?Take 1 tablet by mouth daily. ?  ?Vitamin D-3 25 MCG (1000 UT) Caps ?Take 1,000 Units by mouth daily. ?  ? ?  ? ? ?Allergies  ?Allergen Reactions  ? Penicillins Anaphylaxis  ?  Edema of throat  ? Hctz [Hydrochlorothiazide] Other (See Comments)  ?  gout  ? Morphine And Related Itching  ? Lotrel [Amlodipine Besy-Benazepril Hcl]  Other (See Comments)  ?  Facial muscles draw and break out in whilps  ? ? ?Consultations: ?None  ? ?Procedures/Studies: ?CT Chest Wo Contrast ? ?Result Date: 12/07/2021 ?CLINICAL DATA:  Increased density in right lung apex on radiograph EXAM: CT CHEST WITHOUT CONTRAST TECHNIQUE: Multidetector CT imaging of the chest was performed following the standard protocol without IV contrast. RADIATION DOSE REDUCTION: This exam was performed according to the departmental dose-optimization program which includes automated exposure control, adjustment of the mA and/or kV according to patient size and/or use of iterative reconstruction technique. COMPARISON:  No prior CT, correlation is made with same day chest radiograph and 10/15/2017 chest radiograph FINDINGS: Cardiovascular: No significant vascular findings. Normal heart size. No pericardial effusion. Three-vessel coronary artery calcifications. The main pulmonary artery is prominent, measuring up to 3.2 cm, above the upper limit of normal. Aortic atherosclerotic calcifications. Mediastinum/Nodes: No enlarged mediastinal or axillary lymph nodes. Thyroid gland, trachea, and esophagus demonstrate no significant findings. Lungs/Pleura: Severe centrilobular and paraseptal emphysema. Apical pleural-parenchymal scarring. No focal pulmonary opacity or suspicious pulmonary nodule. Dependent atelectasis. Upper Abdomen: No acute abnormality. Musculoskeletal: S shaped curvature of the thoracolumbar spine. No acute osseous abnormality. IMPRESSION: 1. No acute finding to correlate with the increased density in the  right apex on the same-day radiograph. 2. Severe centrilobular and paraseptal emphysema. 3. The main pulmonary artery is enlarged, as can be seen in the setting of pulmonary hypertension. 4. Aortic atherosclerosis and coronary artery calcifications. Aortic Atherosclerosis (ICD10-I70.0) and Emphysema (ICD10-J43.9). Electronically Signed   By: Merilyn Baba M.D.   On: 12/07/2021  15:27  ? ?DG Chest Port 1 View ? ?Result Date: 12/07/2021 ?CLINICAL DATA:  Shortness of breath. EXAM: PORTABLE CHEST 1 VIEW COMPARISON:  Chest x-ray dated October 15, 2017. FINDINGS: The heart size and medi

## 2021-12-10 NOTE — Care Management Important Message (Signed)
Important Message ? ?Patient Details  ?Name: Paige Rhodes ?MRN: [id number] ?Date of Birth: 1932/03/21 ? ? ?Medicare Important Message Given:  Yes ? ?Reviewed Medicare IM with Vance Gather, daughter, via room phone [phone]).  Copy of Medicare IM sent securely to email address provided: [email or web address].  ? ? ?Dannette Barbara ?12/10/2021, 10:11 AM ?

## 2021-12-10 NOTE — Plan of Care (Signed)
?  Problem: Education: ?Goal: Knowledge of disease or condition will improve ?Outcome: Adequate for Discharge ?Goal: Knowledge of the prescribed therapeutic regimen will improve ?Outcome: Adequate for Discharge ?Goal: Individualized Educational Video(s) ?Outcome: Adequate for Discharge ?  ?Problem: Activity: ?Goal: Ability to tolerate increased activity will improve ?Outcome: Adequate for Discharge ?Goal: Will verbalize the importance of balancing activity with adequate rest periods ?Outcome: Adequate for Discharge ?  ?Problem: Respiratory: ?Goal: Ability to maintain a clear airway will improve ?Outcome: Adequate for Discharge ?Goal: Levels of oxygenation will improve ?Outcome: Adequate for Discharge ?Goal: Ability to maintain adequate ventilation will improve ?Outcome: Adequate for Discharge ?  ?Problem: Activity: ?Goal: Ability to tolerate increased activity will improve ?Outcome: Adequate for Discharge ?  ?Problem: Clinical Measurements: ?Goal: Ability to maintain a body temperature in the normal range will improve ?Outcome: Adequate for Discharge ?  ?Problem: Respiratory: ?Goal: Ability to maintain adequate ventilation will improve ?Outcome: Adequate for Discharge ?Goal: Ability to maintain a clear airway will improve ?Outcome: Adequate for Discharge ?  ?

## 2021-12-10 NOTE — TOC Initial Note (Addendum)
Transition of Care (TOC) - Initial/Assessment Note  ? ? ?Patient Details  ?Name: Paige Rhodes ?MRN: [id number] ?Date of Birth: [birth year] ? ?Transition of Care (TOC) CM/SW Contact:    ?Iona Beard, LCSWA ?Phone Number: ?12/10/2021, 10:12 AM ? ?Clinical Narrative:                 ?TOC consulted for HH/DME needs. CSW spoke with pts daughter to complete assessment. Pt lives alone. Pt can feed herself and get to the bathroom alone. Pts daughters assist with all other ADLs. Pts family provides transportation when needed. Pt has not had HH in the past. Pts daughter states they would like [facility] services at D/C if possible. Pt has a walker to use when needed. Pt is chronic on 2-2.5L O2 supplied through [employer].  ? ?CSW reached out to [place] with [facility] who states that they can accept pt for [facility] PT and RN services. CSW reached out to MD and requested that [facility] orders be placed. TOC to follow.  ? ?Expected Discharge Plan: [place] ?Barriers to Discharge: Continued Medical Work up ? ? ?Patient Goals and CMS Choice ?Patient states their goals for this hospitalization and ongoing recovery are:: Return home ?CMS Medicare.gov Compare Post Acute Care list provided to:: Patient ?Choice offered to / list presented to : Patient, Adult Children ? ?Expected Discharge Plan and Services ?Expected Discharge Plan: [place] ?In-house Referral: Clinical Social Work ?Discharge Planning Services: CM Consult ?Post Acute Care Choice: Home Health ?Living arrangements for the past 2 months: [place] ?                ?  ?  ?  ?  ?  ?  ?  ?  ?  ?  ? ?Prior Living Arrangements/Services ?Living arrangements for the past 2 months: [place] ?Lives with:: Self ?Patient language and need for interpreter reviewed:: Yes ?Do you feel safe going back to the place where you live?: Yes      ?Need for Family Participation in Patient Care: Yes (Comment) ?Care giver support system in place?: Yes  (comment) ?Current home services: DME ?Criminal Activity/Legal Involvement Pertinent to Current Situation/Hospitalization: No - Comment as needed ? ?Activities of Daily Living ?Home Assistive Devices/Equipment: Wheelchair, Environmental consultant (specify type), Oxygen, Eyeglasses, Hearing aid ?ADL Screening (condition at time of admission) ?Patient's cognitive ability adequate to safely complete daily activities?: Yes ?Is the patient deaf or have difficulty hearing?: No ?Does the patient have difficulty seeing, even when wearing glasses/contacts?: No ?Does the patient have difficulty concentrating, remembering, or making decisions?: No ?Patient able to express need for assistance with ADLs?: Yes ?Does the patient have difficulty dressing or bathing?: Yes ?Independently performs ADLs?: No ?Communication: Independent ?Dressing (OT): Needs assistance ?Is this a change from baseline?: Change from baseline, expected to last <3days ?Grooming: Needs assistance ?Is this a change from baseline?: Change from baseline, expected to last <3 days ?Feeding: Independent ?Bathing: Needs assistance ?Is this a change from baseline?: Change from baseline, expected to last <3 days ?Toileting: Needs assistance ?Is this a change from baseline?: Change from baseline, expected to last <3 days ?In/Out Bed: Needs assistance ?Is this a change from baseline?: Change from baseline, expected to last <3 days ?Walks in Home: Needs assistance ?Is this a change from baseline?: Change from baseline, expected to last <3 days ?Does the patient have difficulty walking or climbing stairs?: Yes ?Weakness of Legs: Both ?Weakness of Arms/Hands: None ? ?  Permission Sought/Granted ?  ?  ?   ?   ?   ?   ? ?Emotional Assessment ?Appearance:: Appears stated age ?Attitude/Demeanor/Rapport: Engaged ?Affect (typically observed): Accepting ?Orientation: : Oriented to Self, Oriented to  Time, Oriented to Place, Oriented to Situation ?Alcohol / Substance Use: Not Applicable ?Psych  Involvement: No (comment) ? ?Admission diagnosis:  Lung mass [R91.8] ?UTI (urinary tract infection) [N39.0] ?Pneumonia [J18.9] ?Acute cystitis without hematuria [N30.00] ?AKI (acute kidney injury) ([place]) [N17.9] ?Patient Active Problem List  ? Diagnosis Date Noted  ? Colles' fracture of left radius, initial encounter for closed fracture   ? Elevated troponin 09/02/2017  ? Anemia 09/02/2017  ? Hypomagnesemia 09/02/2017  ? Chronic renal disease, stage 2, mildly decreased glomerular filtration rate between 60-89 mL/min/1.73 square meter 10/31/2014  ? Port catheter in place 01/15/2012  ? Controlled type 2 diabetes mellitus without complication ([place]) 17/00/1749  ? Abnormal presence of protein in urine 01/15/2012  ? Adult hypothyroidism 01/15/2012  ? Hyperparathyroidism ([place]) 01/15/2012  ? HLD (hyperlipidemia) 01/15/2012  ? Gout 01/15/2012  ? Essential (primary) hypertension 01/15/2012  ? Chronic obstructive pulmonary disease ([place]) 01/15/2012  ? OP (osteoporosis) 01/15/2012  ? Infiltrating ductal carcinoma of left breast 03/05/2011  ? Overlapping malignant neoplasm of female breast ([place]) 03/08/2010  ? ?PCP:  [name], MD ?Pharmacy:   ?[facility], [address] ?[place] [address] ?Phone: [phone] Fax: [phone] ? ? ? ? ?Social Determinants of Health (SDOH) Interventions ?  ? ?Readmission Risk Interventions ? ?  12/10/2021  ? 10:08 AM  ?Readmission Risk Prevention Plan  ?Transportation Screening Complete  ?Home Care Screening Complete  ?Medication Review (RN CM) Complete  ? ? ? ?

## 2021-12-10 NOTE — Plan of Care (Signed)
?  Problem: Acute Rehab PT Goals(only PT should resolve) ?Goal: Pt Will Go Supine/Side To Sit ?Outcome: Progressing ?Flowsheets (Taken 12/10/2021 1414) ?Pt will go Supine/Side to Sit: ? with modified independence ? Independently ?Goal: Patient Will Transfer Sit To/From Stand ?Outcome: Progressing ?Flowsheets (Taken 12/10/2021 1414) ?Patient will transfer sit to/from stand: ? with modified independence ? with supervision ?Goal: Pt Will Transfer Bed To Chair/Chair To Bed ?Outcome: Progressing ?Flowsheets (Taken 12/10/2021 1414) ?Pt will Transfer Bed to Chair/Chair to Bed: ? with modified independence ? with supervision ?Goal: Pt Will Ambulate ?Outcome: Progressing ?Flowsheets (Taken 12/10/2021 1414) ?Pt will Ambulate: ? 75 feet ? with modified independence ? with supervision ? with rolling walker ?  ?2:15 PM, 12/10/21 ?Lonell Grandchild, MPT ?Physical Therapist with [place] [facility] ?[facility] ?[phone] office ?[phone] mobile phone ? ?

## 2021-12-10 NOTE — Evaluation (Signed)
Physical Therapy Evaluation ?Patient Details ?Name: Paige Rhodes ?MRN: [id number] ?DOB: 10/16/1931 ?Today's Date: 12/10/2021 ? ?History of Present Illness ? ARMINE RIZZOLO is a 88 y.o. female with medical history significant of COPD on 2.5L oxygen around the clock, NIDDM2, HLD, HTN, Hypothyroidism, osteoporosis, CKD2, GERD, and L infiltrating ductal carcinoma of the left breast followed by Dr Chryl Heck. Patient presents with worsening fatigue/weakness with increased oxygen needs and increased urinary frequency. ?  ?Clinical Impression ? Patient demonstrates slow labored cadence without loss of balance while on 3 LPM with SpO2 dropping from 95% to 84%, limited mostly due to fatigue and mild c/o SOB.  Patient put back on 2.5 LPM with SpO2 increasing above 92% after resting sitting at bedside.  Patient tolerated staying up at bedside after therapy with her daughter present in room.  Patient will benefit from continued skilled physical therapy in hospital and recommended venue below to increase strength, balance, endurance for safe ADLs and gait.    ?   ? ?Recommendations for follow up therapy are one component of a multi-disciplinary discharge planning process, led by the attending physician.  Recommendations may be updated based on patient status, additional functional criteria and insurance authorization. ? ?Follow Up Recommendations Home health PT ? ?  ?Assistance Recommended at Discharge Set up Supervision/Assistance  ?Patient can return home with the following ? A little help with walking and/or transfers;A little help with bathing/dressing/bathroom;Help with stairs or ramp for entrance;Assistance with cooking/housework ? ?  ?Equipment Recommendations None recommended by PT  ?Recommendations for Other Services ?    ?  ?Functional Status Assessment Patient has had a recent decline in their functional status and demonstrates the ability to make significant improvements in function in a reasonable and predictable amount  of time.  ? ?  ?Precautions / Restrictions Precautions ?Precautions: Fall ?Restrictions ?Weight Bearing Restrictions: No  ? ?  ? ?Mobility ? Bed Mobility ?Overal bed mobility: Modified Independent ?  ?  ?  ?  ?  ?  ?General bed mobility comments: required HOB raised (sleeps in recliner at home) ?  ? ?Transfers ?Overall transfer level: Needs assistance ?Equipment used: Rolling walker (2 wheels) ?Transfers: Sit to/from Stand, Bed to chair/wheelchair/BSC ?Sit to Stand: Supervision, Min guard ?  ?Step pivot transfers: Supervision, Min guard ?  ?  ?  ?General transfer comment: increased time, labored movement ?  ? ?Ambulation/Gait ?Ambulation/Gait assistance: Supervision, Min guard ?Gait Distance (Feet): 50 Feet ?Assistive device: Rolling walker (2 wheels) ?Gait Pattern/deviations: Decreased step length - right, Decreased step length - left, Decreased stride length, Trunk flexed ?Gait velocity: decreased ?  ?  ?General Gait Details: slow labored cadence with flexed trunk, on 3 LPM with SpO2 dropping to 84% during ambulation, limited due to c/o fatigue and mild SOB ? ?Stairs ?  ?  ?  ?  ?  ? ?Wheelchair Mobility ?  ? ?Modified Rankin (Stroke Patients Only) ?  ? ?  ? ?Balance Overall balance assessment: Needs assistance ?Sitting-balance support: Feet supported, No upper extremity supported ?Sitting balance-Leahy Scale: Fair ?Sitting balance - Comments: fair/good seated at EOB ?  ?Standing balance support: During functional activity, No upper extremity supported ?Standing balance-Leahy Scale: Fair ?Standing balance comment: fair/good using RW ?  ?  ?  ?  ?  ?  ?  ?  ?  ?  ?  ?   ? ? ? ?Pertinent Vitals/Pain Pain Assessment ?Pain Assessment: No/denies pain  ? ? ?Home Living Family/patient expects to be discharged  to:: Private residence ?Living Arrangements: Alone ?Available Help at Discharge: Family;Available PRN/intermittently ?Type of Home: House ?Home Access: Stairs to enter ?Entrance Stairs-Rails: Right;Left;Can reach  both ?Entrance Stairs-Number of Steps: 2 ?  ?Home Layout: One level ?Home Equipment: Conservation officer, nature (2 wheels);Wheelchair - manual;BSC/3in1;Rollator (4 wheels) ?   ?  ?Prior Function Prior Level of Function : Independent/Modified Independent ?  ?  ?  ?  ?  ?  ?Mobility Comments: household ambulator using RW PRN ?ADLs Comments: assisted by family ?  ? ? ?Hand Dominance  ?   ? ?  ?Extremity/Trunk Assessment  ? Upper Extremity Assessment ?Upper Extremity Assessment: Overall WFL for tasks assessed ?  ? ?Lower Extremity Assessment ?Lower Extremity Assessment: Generalized weakness ?  ? ?Cervical / Trunk Assessment ?Cervical / Trunk Assessment: Kyphotic  ?Communication  ? Communication: No difficulties  ?Cognition Arousal/Alertness: Awake/alert ?Behavior During Therapy: [facility] for tasks assessed/performed ?Overall Cognitive Status: Within Functional Limits for tasks assessed ?  ?  ?  ?  ?  ?  ?  ?  ?  ?  ?  ?  ?  ?  ?  ?  ?  ?  ?  ? ?  ?General Comments   ? ?  ?Exercises    ? ?Assessment/Plan  ?  ?PT Assessment Patient needs continued PT services  ?PT Problem List Decreased strength;Decreased activity tolerance;Decreased balance;Decreased mobility ? ?   ?  ?PT Treatment Interventions DME instruction;Gait training;Stair training;Functional mobility training;Therapeutic activities;Therapeutic exercise;Patient/family education;Balance training   ? ?PT Goals (Current goals can be found in the Care Plan section)  ?Acute Rehab PT Goals ?Patient Stated Goal: return home with family to assist ?PT Goal Formulation: With patient ?Time For Goal Achievement: 12/17/21 ?Potential to Achieve Goals: Good ? ?  ?Frequency Min 3X/week ?  ? ? ?Co-evaluation   ?  ?  ?  ?  ? ? ?  ?AM-PAC PT "6 Clicks" Mobility  ?Outcome Measure Help needed turning from your back to your side while in a flat bed without using bedrails?: None ?Help needed moving from lying on your back to sitting on the side of a flat bed without using bedrails?: A Little ?Help  needed moving to and from a bed to a chair (including a wheelchair)?: A Little ?Help needed standing up from a chair using your arms (e.g., wheelchair or bedside chair)?: A Little ?Help needed to walk in hospital room?: A Little ?Help needed climbing 3-5 steps with a railing? : A Little ?6 Click Score: 19 ? ?  ?End of Session Equipment Utilized During Treatment: Oxygen ?Activity Tolerance: Patient tolerated treatment well;Patient limited by fatigue ?Patient left: in bed;with call bell/phone within reach ?Nurse Communication: Mobility status ?PT Visit Diagnosis: Unsteadiness on feet (R26.81);Other abnormalities of gait and mobility (R26.89);Muscle weakness (generalized) (M62.81) ?  ? ?Time: 9242-6834 ?PT Time Calculation (min) (ACUTE ONLY): 22 min ? ? ?Charges:   PT Evaluation ?$PT Eval Moderate Complexity: 1 Mod ?PT Treatments ?$Therapeutic Activity: 8-22 mins ?  ?   ? ? ?2:13 PM, 12/10/21 ?Lonell Grandchild, MPT ?Physical Therapist with  ?[facility] ?[phone] office ?[phone] mobile phone ? ? ?

## 2021-12-10 NOTE — Progress Notes (Signed)
SATURATION QUALIFICATIONS: (This note is used to comply with regulatory documentation for home oxygen) ? ?Patient Saturations on Room Air at Rest = 88% ? ?Patient Saturations on Room Air while Ambulating = 83 % ? ?Patient Saturations on 2.5 Liters of oxygen while Ambulating = 97% ? ?Please briefly explain why patient needs home oxygen: ?

## 2021-12-16 DIAGNOSIS — Z9981 Dependence on supplemental oxygen: Secondary | ICD-10-CM | POA: Diagnosis not present

## 2021-12-16 DIAGNOSIS — K219 Gastro-esophageal reflux disease without esophagitis: Secondary | ICD-10-CM | POA: Diagnosis not present

## 2021-12-16 DIAGNOSIS — N182 Chronic kidney disease, stage 2 (mild): Secondary | ICD-10-CM | POA: Diagnosis not present

## 2021-12-16 DIAGNOSIS — M109 Gout, unspecified: Secondary | ICD-10-CM | POA: Diagnosis not present

## 2021-12-16 DIAGNOSIS — Z7952 Long term (current) use of systemic steroids: Secondary | ICD-10-CM | POA: Diagnosis not present

## 2021-12-16 DIAGNOSIS — M810 Age-related osteoporosis without current pathological fracture: Secondary | ICD-10-CM | POA: Diagnosis not present

## 2021-12-16 DIAGNOSIS — E039 Hypothyroidism, unspecified: Secondary | ICD-10-CM | POA: Diagnosis not present

## 2021-12-16 DIAGNOSIS — J432 Centrilobular emphysema: Secondary | ICD-10-CM | POA: Diagnosis not present

## 2021-12-16 DIAGNOSIS — I700 Atherosclerosis of aorta: Secondary | ICD-10-CM | POA: Diagnosis not present

## 2021-12-16 DIAGNOSIS — I129 Hypertensive chronic kidney disease with stage 1 through stage 4 chronic kidney disease, or unspecified chronic kidney disease: Secondary | ICD-10-CM | POA: Diagnosis not present

## 2021-12-16 DIAGNOSIS — Z87440 Personal history of urinary (tract) infections: Secondary | ICD-10-CM | POA: Diagnosis not present

## 2021-12-16 DIAGNOSIS — Z7982 Long term (current) use of aspirin: Secondary | ICD-10-CM | POA: Diagnosis not present

## 2021-12-16 DIAGNOSIS — D0512 Intraductal carcinoma in situ of left breast: Secondary | ICD-10-CM | POA: Diagnosis not present

## 2021-12-16 DIAGNOSIS — H409 Unspecified glaucoma: Secondary | ICD-10-CM | POA: Diagnosis not present

## 2021-12-16 DIAGNOSIS — E1122 Type 2 diabetes mellitus with diabetic chronic kidney disease: Secondary | ICD-10-CM | POA: Diagnosis not present

## 2021-12-16 DIAGNOSIS — E785 Hyperlipidemia, unspecified: Secondary | ICD-10-CM | POA: Diagnosis not present

## 2021-12-16 DIAGNOSIS — J9621 Acute and chronic respiratory failure with hypoxia: Secondary | ICD-10-CM | POA: Diagnosis not present

## 2021-12-17 ENCOUNTER — Ambulatory Visit: Payer: Medicare Other | Admitting: Internal Medicine

## 2021-12-17 ENCOUNTER — Encounter: Payer: Self-pay | Admitting: Internal Medicine

## 2021-12-17 DIAGNOSIS — J9611 Chronic respiratory failure with hypoxia: Secondary | ICD-10-CM | POA: Diagnosis not present

## 2021-12-17 DIAGNOSIS — J440 Chronic obstructive pulmonary disease with acute lower respiratory infection: Secondary | ICD-10-CM | POA: Diagnosis not present

## 2021-12-17 DIAGNOSIS — J449 Chronic obstructive pulmonary disease, unspecified: Secondary | ICD-10-CM

## 2021-12-17 DIAGNOSIS — Z79899 Other long term (current) drug therapy: Secondary | ICD-10-CM | POA: Diagnosis not present

## 2021-12-17 DIAGNOSIS — I10 Essential (primary) hypertension: Secondary | ICD-10-CM

## 2021-12-17 DIAGNOSIS — N179 Acute kidney failure, unspecified: Secondary | ICD-10-CM | POA: Diagnosis not present

## 2021-12-17 DIAGNOSIS — I700 Atherosclerosis of aorta: Secondary | ICD-10-CM | POA: Diagnosis not present

## 2021-12-17 MED ORDER — SPIRIVA HANDIHALER 18 MCG IN CAPS: ORAL_CAPSULE | RESPIRATORY_TRACT | 11 refills | Status: DC

## 2021-12-17 NOTE — Patient Instructions (Addendum)
Plan A = Automatic = Always=    Breo one click first thing in am and Spiriva right after ? ? ?Plan B = Backup (to supplement plan A, not to replace it) ?Only use your albuterol inhaler as a rescue medication to be used if you can't catch your breath by resting or doing a relaxed purse lip breathing pattern.  ?- The less you use it, the better it will work when you need it. ?- Ok to use the inhaler up to 2 puffs  every 4 hours if you must but call for appointment if use goes up over your usual need ?- Don't leave home without it !!  (think of it like the spare tire for your car)  ? ? ?Make sure you check your oxygen saturation  AT  your highest level of activity (not after you stop)   to be sure it stays over 90% and adjust  02 flow upward to maintain this level if needed but remember to turn it back to previous settings when you stop (to conserve your supply).  ? ?Please schedule a follow up office visit in 6 weeks, call sooner if needed - bring all your inhalers  ? ?  ?

## 2021-12-17 NOTE — Assessment & Plan Note (Addendum)
Stopped smoking in 1992 ?- Pfts by Hawkings in 07/01/11  "mod/severe airflow obst/ severe decrease DLCO" maint on spiriva dpi   ?- 12/17/2021  After extensive coaching inhaler device,  effectiveness =    80% with elipta and handihaler ? ? Group D (the new E since she was admitted with AECOPD)  in terms of symptom/risk and laba/lama/ICS  therefore appropriate rx at this point >>> add back spiriva to [facility] until uses up present supplies then return to transition to trelegy  ? ?Re saba: very poor techique - can use duoneb as back up for now though on LAMA I refer pure SABA as a back up to prevent excess anticholinergic symptoms ? ? ? ?  ? ? ?

## 2021-12-17 NOTE — Progress Notes (Signed)
? ?Paige Rhodes, female    DOB: Jan 06, 1932   MRN: [id number] ? ? ?Brief patient profile:  ?63  yowf  quit smoking 1992  referred to pulmonary clinic in [place]  12/17/2021 by [facility]  for copd eval with onset around 2013 / 02 since then  and housebound mostly x 2017/18 ? ?Pfts by Luan Pulling in 07/01/11  "mod/severe airflow obst/ severe decrease DLCO" maint on spiriva dpi chronically then admit:  ? ? ?Admit date: 12/07/2021 ?Discharge date: 12/10/2021 ?Discharge Condition: Stable ?CODE STATUS: Full ?Diet recommendation: As tolerated ?  ?Brief/Interim Summary: ?Paige Rhodes is a 88 y.o. female with medical history significant of COPD on 2.5L oxygen around the clock, NIDDM2, HLD, HTN, Hypothyroidism, osteoporosis, CKD2, GERD, and L infiltrating ductal carcinoma of the left breast followed by Dr Chryl Heck. Patient presents with worsening fatigue/weakness with increased oxygen needs and questionably increased urinary frequency. ?  ?Patient admitted as above with acute on chronic hypoxic respiratory failure, initially presumed to be pneumonia given atypical chest x-ray with right upper lobe opacification, however on repeat CT no pneumonias were noted, given patient's acute hypoxia and known history of COPD acute COPD exacerbation was most likely diagnosis.  Patient improved drastically on inhalers, steroids, continue steroid taper at discharge.    ?  ?Discharge Diagnoses:  ?Active Problems: ?  Infiltrating ductal carcinoma of left breast ?  HLD (hyperlipidemia) ?  Chronic obstructive pulmonary disease (HCC) ? ?History of Present Illness  ?12/17/2021  Pulmonary/ 1st office eval/ Melvyn Novas / [facility]  ?Chief Complaint  ?Patient presents with  ? Consult  ?  Consult for COPD. Recent hospitalization 4/7-4/10 for SOB  ?Has been on oxygen for 10+ years/   ?Dyspnea: improved since d/c on Breo , did not restart spiriva  ?Cough: none at time of ov ?Sleep: 45 degrees in recliner x years  ?SABA use: rarely hfa  ?02  2.5 lpm 24/7 does not  titrate at all ? ?No obvious day to day or daytime variability or assoc excess/ purulent sputum or mucus plugs or hemoptysis or cp or chest tightness, subjective wheeze or overt sinus or hb symptoms.  ? ?Sleeping as above  without nocturnal  or early am exacerbation  of respiratory  c/o's or need for noct saba. Also denies any obvious fluctuation of symptoms with weather or environmental changes or other aggravating or alleviating factors except as outlined above  ? ?No unusual exposure hx or h/o childhood pna/ asthma or knowledge of premature birth. ? ?Current Allergies, Complete Past Medical History, Past Surgical History, Family History, and Social History were reviewed in [employer] record. ? ?ROS  The following are not active complaints unless bolded ?Hoarseness, sore throat, dysphagia, dental problems, itching, sneezing,  nasal congestion or discharge of excess mucus or purulent secretions, ear ache,   fever, chills, sweats, unintended wt loss or wt gain, classically pleuritic or exertional cp,  orthopnea pnd or arm/hand swelling  or leg swelling, presyncope, palpitations, abdominal pain, anorexia, nausea, vomiting, diarrhea  or change in bowel habits or change in bladder habits, change in stools or change in urine, dysuria, hematuria,  rash, arthralgias, visual complaints, headache, numbness, weakness or ataxia or problems with walking or coordination,  change in mood or  memory. ?      ?   ? ? ?    ?   ?     ? ? ? ?Past Medical History:  ?Diagnosis Date  ? Breast cancer ([place])   ? Chronic  renal disease, stage 2, mildly decreased glomerular filtration rate between 60-89 mL/min/1.73 square meter 10/31/2014  ? COPD (chronic obstructive pulmonary disease) ([place])   ? Diabetes mellitus   ? GERD (gastroesophageal reflux disease)   ? occasional  ? Glaucoma   ? Gout attack june 2013  ? left foot  ? Hard of hearing   ? Hyperlipidemia   ? Hypertension   ? Hypothyroidism   ? Infiltrating ductal  carcinoma of left breast 03/05/2011  ? On home oxygen therapy   ? 2 LPM  ? Osteoporosis   ? Port catheter in place 01/15/2012  ? SOB (shortness of breath)   ? Thyroid disease   ? ? ?Outpatient Medications Prior to Visit  ?Medication Sig Dispense Refill  ? albuterol (VENTOLIN HFA) 108 (90 Base) MCG/ACT inhaler Inhale 2 puffs into the lungs every 6 (six) hours as needed for wheezing or shortness of breath.    ? amLODipine (NORVASC) 5 MG tablet Take 5 mg by mouth daily.     ? Ascorbic Acid (VITAMIN C PO) Take 1 tablet by mouth daily.    ? aspirin EC 81 MG tablet Take 81 mg by mouth daily.    ? atorvastatin (LIPITOR) 10 MG tablet Take 10 mg by mouth daily.    ? Calcium-Magnesium-Vitamin D (CALCIUM 500 PO) Take 1 tablet by mouth daily.    ? carvedilol (COREG) 25 MG tablet Take 25 mg by mouth 2 (two) times daily with a meal.      ? Cholecalciferol (VITAMIN D-3) 1000 units CAPS Take 1,000 Units by mouth daily.    ? diphenhydrAMINE HCl, Sleep, 50 MG CAPS Take 50 mg by mouth at bedtime.    ? fluticasone furoate-vilanterol (BREO ELLIPTA) 100-25 MCG/ACT AEPB Inhale 1 puff into the lungs daily. 60 each 0  ? ipratropium-albuterol (DUONEB) 0.5-2.5 (3) MG/3ML SOLN Take 3 mLs by nebulization every 4 (four) hours as needed. 360 mL 1  ? levothyroxine (SYNTHROID, LEVOTHROID) 125 MCG tablet Take 125 mcg by mouth daily.      ? losartan (COZAAR) 50 MG tablet Take 50 mg by mouth daily.      ? methylPREDNISolone (MEDROL DOSEPAK) 4 MG TBPK tablet Taper as directed on packaging 1 each 0  ? Multiple Vitamin (MULTIVITAMIN) tablet Take 1 tablet by mouth daily.    ? ?Facility-Administered Medications Prior to Visit  ?Medication Dose Route Frequency Provider Last Rate Last Admin  ? sodium chloride 0.9 % injection 10 mL  10 mL Intravenous PRN Farrel Gobble, MD   10 mL at 05/05/14 1150  ? ? ? ?Objective:  ?  ? ?BP 130/64 (BP Location: Right Arm, Patient Position: Sitting)   Pulse 72   Temp 98.2 ?F (36.8 ?C) (Temporal)   Ht '5\' 2"'$  (1.575 m)   Wt  164 lb 3.2 oz (74.5 kg)   SpO2 (!) 85% Comment: 2.5LO2 pulse after walking from lobby to room  BMI 30.03 kg/m?  ? ?SpO2: (!) 85 % (2.5LO2 pulse after walking from lobby to room) ? ?Amb wf 4 pronger  walker  ? ?HEENT : nl exam  ? ? ?NECK :  without JVD/Nodes/TM/ nl carotid upstrokes bilaterally ? ? ?LUNGS: no acc muscle use,  Mod barrel  contour chest wall with bilateral  Distant bs s audible wheeze and  without cough on insp or exp maneuvers and mod  Hyperresonant  to  percussion bilaterally   ? ? ?CV:  RRR  no s3 or murmur or increase in P2, and no  edema  ? ?ABD:  soft and nontender with pos mid insp Hoover's  in the supine position. No bruits or organomegaly appreciated, bowel sounds nl ? ?MS:     ext warm without deformities, calf tenderness, cyanosis or clubbing ?No obvious joint restrictions  ? ?SKIN: warm and dry without lesions   ? ?NEURO:  alert, approp, nl sensorium with  no motor or cerebellar deficits apparent.  ?    ? ? ?I personally reviewed images and agree with radiology impression as follows:  ? Chest CT w/o contrast 12/07/21  ?1. No acute finding to correlate with the increased density in the ?right apex on the same-day radiograph. ?2. Severe centrilobular and paraseptal emphysema. ?3. The main pulmonary artery is enlarged, as can be seen in the ?setting of pulmonary hypertension. ?4. Aortic atherosclerosis and coronary artery calcifications. ? ?   ?Assessment  ? ?COPD clinically severe, Group E  ?Stopped smoking in 1992 ?- Pfts by Hawkings in 07/01/11  "mod/severe airflow obst/ severe decrease DLCO" maint on spiriva dpi   ?- 12/17/2021  After extensive coaching inhaler device,  effectiveness =    80% with elipta and handihaler ? ? Group D (the new E since she was admitted with AECOPD)  in terms of symptom/risk and laba/lama/ICS  therefore appropriate rx at this point >>> add back spiriva to [facility] until uses up present supplies then return to transition to trelegy  ? ?Re saba: very poor techique - can  use duoneb as back up for now though on LAMA I refer pure SABA as a back up to prevent excess anticholinergic symptoms ? ? ? ? ?Chronic respiratory failure with hypoxia (HCC) ?On 02 since ? 2013 initially by

## 2021-12-17 NOTE — Assessment & Plan Note (Signed)
bp well controlled at present but she's on mod high doses of coreg ? ?In the setting of poorly controlled copd,  would be preferable to use bystolic, the most beta -1  selective Beta blocker available in sample form, with bisoprolol the most selective generic choice  on the market, at least on a trial basis, to make sure the spillover Beta 2 effects of the less specific Beta blockers are not contributing to this patient's symptoms.  ? ? ?>>> consider transition to Bisoprolol if flares on triple rx for copd but hold off for now  ?

## 2021-12-17 NOTE — Assessment & Plan Note (Signed)
On 02 since ? 2013 initially by Dr Luan Pulling ?- 12/17/2021   Walked on 2.5 pulsed 02    x  2  lap(s) =  approx 300  ft  @ slow pace, stopped due to sob with lowest 02 sats 87% then increased to 3.5 pulsed x one more lap with lowest sats  91%  ? ?Advised: ?Make sure you check your oxygen saturation  AT  your highest level of activity (not after you stop)   to be sure it stays over 90% and adjust  02 flow upward to maintain this level if needed but remember to turn it back to previous settings when you stop (to conserve your supply).  ? ?Each maintenance medication was reviewed in detail including emphasizing most importantly the difference between maintenance and prns and under what circumstances the prns are to be triggered using an action plan format where appropriate. ? ?Total time for H and P, chart review, counseling, reviewing hfa/dpi's/02  device(s) , directly observing portions of ambulatory 02 saturation study/ and generating customized AVS unique to this office visit / same day charting > 60 min  ?     ?  ?      ?

## 2021-12-18 DIAGNOSIS — Z7952 Long term (current) use of systemic steroids: Secondary | ICD-10-CM | POA: Diagnosis not present

## 2021-12-18 DIAGNOSIS — N182 Chronic kidney disease, stage 2 (mild): Secondary | ICD-10-CM | POA: Diagnosis not present

## 2021-12-18 DIAGNOSIS — D0512 Intraductal carcinoma in situ of left breast: Secondary | ICD-10-CM | POA: Diagnosis not present

## 2021-12-18 DIAGNOSIS — M109 Gout, unspecified: Secondary | ICD-10-CM | POA: Diagnosis not present

## 2021-12-18 DIAGNOSIS — Z7982 Long term (current) use of aspirin: Secondary | ICD-10-CM | POA: Diagnosis not present

## 2021-12-18 DIAGNOSIS — J432 Centrilobular emphysema: Secondary | ICD-10-CM | POA: Diagnosis not present

## 2021-12-18 DIAGNOSIS — Z87440 Personal history of urinary (tract) infections: Secondary | ICD-10-CM | POA: Diagnosis not present

## 2021-12-18 DIAGNOSIS — E785 Hyperlipidemia, unspecified: Secondary | ICD-10-CM | POA: Diagnosis not present

## 2021-12-18 DIAGNOSIS — M810 Age-related osteoporosis without current pathological fracture: Secondary | ICD-10-CM | POA: Diagnosis not present

## 2021-12-18 DIAGNOSIS — H409 Unspecified glaucoma: Secondary | ICD-10-CM | POA: Diagnosis not present

## 2021-12-18 DIAGNOSIS — Z9981 Dependence on supplemental oxygen: Secondary | ICD-10-CM | POA: Diagnosis not present

## 2021-12-18 DIAGNOSIS — J9621 Acute and chronic respiratory failure with hypoxia: Secondary | ICD-10-CM | POA: Diagnosis not present

## 2021-12-18 DIAGNOSIS — I700 Atherosclerosis of aorta: Secondary | ICD-10-CM | POA: Diagnosis not present

## 2021-12-18 DIAGNOSIS — I129 Hypertensive chronic kidney disease with stage 1 through stage 4 chronic kidney disease, or unspecified chronic kidney disease: Secondary | ICD-10-CM | POA: Diagnosis not present

## 2021-12-18 DIAGNOSIS — K219 Gastro-esophageal reflux disease without esophagitis: Secondary | ICD-10-CM | POA: Diagnosis not present

## 2021-12-18 DIAGNOSIS — E039 Hypothyroidism, unspecified: Secondary | ICD-10-CM | POA: Diagnosis not present

## 2021-12-18 DIAGNOSIS — E1122 Type 2 diabetes mellitus with diabetic chronic kidney disease: Secondary | ICD-10-CM | POA: Diagnosis not present

## 2021-12-19 DIAGNOSIS — N182 Chronic kidney disease, stage 2 (mild): Secondary | ICD-10-CM | POA: Diagnosis not present

## 2021-12-19 DIAGNOSIS — M109 Gout, unspecified: Secondary | ICD-10-CM | POA: Diagnosis not present

## 2021-12-19 DIAGNOSIS — E1122 Type 2 diabetes mellitus with diabetic chronic kidney disease: Secondary | ICD-10-CM | POA: Diagnosis not present

## 2021-12-19 DIAGNOSIS — J9621 Acute and chronic respiratory failure with hypoxia: Secondary | ICD-10-CM | POA: Diagnosis not present

## 2021-12-19 DIAGNOSIS — E785 Hyperlipidemia, unspecified: Secondary | ICD-10-CM | POA: Diagnosis not present

## 2021-12-19 DIAGNOSIS — E039 Hypothyroidism, unspecified: Secondary | ICD-10-CM | POA: Diagnosis not present

## 2021-12-19 DIAGNOSIS — Z87440 Personal history of urinary (tract) infections: Secondary | ICD-10-CM | POA: Diagnosis not present

## 2021-12-19 DIAGNOSIS — K219 Gastro-esophageal reflux disease without esophagitis: Secondary | ICD-10-CM | POA: Diagnosis not present

## 2021-12-19 DIAGNOSIS — Z7982 Long term (current) use of aspirin: Secondary | ICD-10-CM | POA: Diagnosis not present

## 2021-12-19 DIAGNOSIS — J432 Centrilobular emphysema: Secondary | ICD-10-CM | POA: Diagnosis not present

## 2021-12-19 DIAGNOSIS — Z7952 Long term (current) use of systemic steroids: Secondary | ICD-10-CM | POA: Diagnosis not present

## 2021-12-19 DIAGNOSIS — I700 Atherosclerosis of aorta: Secondary | ICD-10-CM | POA: Diagnosis not present

## 2021-12-19 DIAGNOSIS — M810 Age-related osteoporosis without current pathological fracture: Secondary | ICD-10-CM | POA: Diagnosis not present

## 2021-12-19 DIAGNOSIS — Z9981 Dependence on supplemental oxygen: Secondary | ICD-10-CM | POA: Diagnosis not present

## 2021-12-19 DIAGNOSIS — D0512 Intraductal carcinoma in situ of left breast: Secondary | ICD-10-CM | POA: Diagnosis not present

## 2021-12-19 DIAGNOSIS — H409 Unspecified glaucoma: Secondary | ICD-10-CM | POA: Diagnosis not present

## 2021-12-19 DIAGNOSIS — I129 Hypertensive chronic kidney disease with stage 1 through stage 4 chronic kidney disease, or unspecified chronic kidney disease: Secondary | ICD-10-CM | POA: Diagnosis not present

## 2021-12-20 DIAGNOSIS — I10 Essential (primary) hypertension: Secondary | ICD-10-CM | POA: Diagnosis not present

## 2021-12-20 DIAGNOSIS — E119 Type 2 diabetes mellitus without complications: Secondary | ICD-10-CM | POA: Diagnosis not present

## 2021-12-20 DIAGNOSIS — J449 Chronic obstructive pulmonary disease, unspecified: Secondary | ICD-10-CM | POA: Diagnosis not present

## 2021-12-20 DIAGNOSIS — E785 Hyperlipidemia, unspecified: Secondary | ICD-10-CM | POA: Diagnosis not present

## 2022-01-01 DIAGNOSIS — N3000 Acute cystitis without hematuria: Secondary | ICD-10-CM | POA: Diagnosis not present

## 2022-01-14 ENCOUNTER — Institutional Professional Consult (permissible substitution): Payer: Medicare Other | Admitting: Internal Medicine

## 2022-01-31 ENCOUNTER — Encounter: Payer: Self-pay | Admitting: Internal Medicine

## 2022-01-31 ENCOUNTER — Ambulatory Visit (INDEPENDENT_AMBULATORY_CARE_PROVIDER_SITE_OTHER): Payer: Medicare Other | Admitting: Internal Medicine

## 2022-01-31 DIAGNOSIS — J9611 Chronic respiratory failure with hypoxia: Secondary | ICD-10-CM | POA: Diagnosis not present

## 2022-01-31 DIAGNOSIS — J449 Chronic obstructive pulmonary disease, unspecified: Secondary | ICD-10-CM | POA: Diagnosis not present

## 2022-01-31 MED ORDER — TRELEGY ELLIPTA 100-62.5-25 MCG/ACT IN AEPB: INHALATION_SPRAY | RESPIRATORY_TRACT | 11 refills | Status: DC

## 2022-01-31 MED ORDER — TRELEGY ELLIPTA 100-62.5-25 MCG/ACT IN AEPB: 1.0000 | INHALATION_SPRAY | Freq: Every day | RESPIRATORY_TRACT | 0 refills | Status: DC

## 2022-01-31 NOTE — Progress Notes (Signed)
Paige Rhodes, female    DOB: 1932-01-20   MRN: [id number]   Brief patient profile:  51  yowf  quit smoking 1992  referred to pulmonary clinic in [place]  12/17/2021 by [facility]  for copd eval with onset around 2013 / 02 since then  and housebound mostly x 2017/18  Pfts by Luan Pulling in 07/01/11  "mod/severe airflow obst/ severe decrease DLCO" maint on spiriva dpi chronically then admit:    Admit date: 12/07/2021 Discharge date: 12/10/2021   Brief/Interim Summary: Paige Rhodes is a 88 y.o. female with medical history significant of COPD on 2.5L oxygen around the clock, NIDDM2, HLD, HTN, Hypothyroidism, osteoporosis, CKD2, GERD, and L infiltrating ductal carcinoma of the left breast followed by Dr Chryl Heck. Patient presents with worsening fatigue/weakness with increased oxygen needs and questionably increased urinary frequency.   Patient admitted as above with acute on chronic hypoxic respiratory failure, initially presumed to be pneumonia given atypical chest x-ray with right upper lobe opacification, however on repeat CT no pneumonias were noted, given patient's acute hypoxia and known history of COPD acute COPD exacerbation was most likely diagnosis.  Patient improved drastically on inhalers, steroids, continue steroid taper at discharge.      Discharge Diagnoses:  Active Problems:   Infiltrating ductal carcinoma of left breast   HLD (hyperlipidemia)   Chronic obstructive pulmonary disease (HCC)  History of Present Illness  12/17/2021  Pulmonary/ 1st office eval/ Toye Rouillard / [facility]  Chief Complaint  Patient presents with   Consult    Consult for COPD. Recent hospitalization 4/7-4/10 for SOB  Has been on oxygen for 10+ years/   Dyspnea: improved since d/c on Breo , did not restart spiriva  Cough: none at time of ov Sleep: 45 degrees in recliner x years  SABA use: rarely hfa  02  2.5 lpm 24/7 does not titrate at all Rec Plan A = Automatic = Always=    Breo one click first thing in  am and Spiriva right after Plan B = Backup (to supplement plan A, not to replace it) Only use your albuterol inhaler as a rescue medication Make sure you check your oxygen saturation  AT  your highest level of activity (not after you stop)   to be sure it stays over 90%   Please schedule a follow up office visit in 6 weeks, call sooner if needed - bring all your inhalers    01/31/2022  f/u ov/[facility]/Emanuel Campos re: copd  maint on just spiriva   Chief Complaint  Patient presents with   Follow-up    Feels breathing has worsened since coming off of breo    Dyspnea:  out to hairdresser and doctor only / ok at house, getslet out at door  Cough: none  Sleeping: 45 degree recliner  SABA use: occ  02: 2.5 lpm at home and 3.5 POC  Covid status: x4      No obvious day to day or daytime variability or assoc excess/ purulent sputum or mucus plugs or hemoptysis or cp or chest tightness, subjective wheeze or overt sinus or hb symptoms.   Sleeping  without nocturnal  or early am exacerbation  of respiratory  c/o's or need for noct saba. Also denies any obvious fluctuation of symptoms with weather or environmental changes or other aggravating or alleviating factors except as outlined above   No unusual exposure hx or h/o childhood pna/ asthma or knowledge of premature birth.  Current Allergies, Complete Past Medical History, Past  Surgical History, Family History, and Social History were reviewed in [employer] record.  ROS  The following are not active complaints unless bolded Hoarseness, sore throat, dysphagia, dental problems, itching, sneezing,  nasal congestion or discharge of excess mucus or purulent secretions, ear ache,   fever, chills, sweats, unintended wt loss or wt gain, classically pleuritic or exertional cp,  orthopnea pnd or arm/hand swelling  or leg swelling, presyncope, palpitations, abdominal pain, anorexia, nausea, vomiting, diarrhea  or change in bowel  habits or change in bladder habits, change in stools or change in urine, dysuria, hematuria,  rash, arthralgias, visual complaints, headache, numbness, weakness or ataxia or problems with walking or coordination,  change in mood or  memory.        Current Meds  Medication Sig   albuterol (VENTOLIN HFA) 108 (90 Base) MCG/ACT inhaler Inhale 2 puffs into the lungs every 6 (six) hours as needed for wheezing or shortness of breath.   amLODipine (NORVASC) 5 MG tablet Take 5 mg by mouth daily.    Ascorbic Acid (VITAMIN C PO) Take 1 tablet by mouth daily.   aspirin EC 81 MG tablet Take 81 mg by mouth daily.   atorvastatin (LIPITOR) 10 MG tablet Take 10 mg by mouth daily.   Calcium-Magnesium-Vitamin D (CALCIUM 500 PO) Take 1 tablet by mouth daily.   carvedilol (COREG) 25 MG tablet Take 25 mg by mouth 2 (two) times daily with a meal.     Cholecalciferol (VITAMIN D-3) 1000 units CAPS Take 1,000 Units by mouth daily.   diphenhydrAMINE HCl, Sleep, 50 MG CAPS Take 50 mg by mouth at bedtime.   ipratropium-albuterol (DUONEB) 0.5-2.5 (3) MG/3ML SOLN Take 3 mLs by nebulization every 4 (four) hours as needed.   levothyroxine (SYNTHROID, LEVOTHROID) 125 MCG tablet Take 125 mcg by mouth daily.     losartan (COZAAR) 50 MG tablet Take 50 mg by mouth daily.     methylPREDNISolone (MEDROL DOSEPAK) 4 MG TBPK tablet Taper as directed on packaging   Multiple Vitamin (MULTIVITAMIN) tablet Take 1 tablet by mouth daily.   tiotropium (SPIRIVA HANDIHALER) 18 MCG inhalation capsule One  capsule each am                          Past Medical History:  Diagnosis Date   Breast cancer ([place])    Chronic renal disease, stage 2, mildly decreased glomerular filtration rate between 60-89 mL/min/1.73 square meter 10/31/2014   COPD (chronic obstructive pulmonary disease) ([place])    Diabetes mellitus    GERD (gastroesophageal reflux disease)    occasional   Glaucoma    Gout attack june 2013   left foot   Hard of hearing     Hyperlipidemia    Hypertension    Hypothyroidism    Infiltrating ductal carcinoma of left breast 03/05/2011   On home oxygen therapy    2 LPM   Osteoporosis    Port catheter in place 01/15/2012   SOB (shortness of breath)    Thyroid disease        Objective:      Wt Readings from Last 3 Encounters:  01/31/22 163 lb 9.6 oz (74.2 kg)  12/17/21 164 lb 3.2 oz (74.5 kg)  12/07/21 175 lb (79.4 kg)      Vital signs reviewed  01/31/2022  - Note  02 sats  88% on 3.5 lpm POC immediately p sitting    General appearance:    elderly  wf nad/ uses 2 wheeled walker     HEENT :  Oropharynx  clear   Nasal turbintes nl    NECK :  without JVD/Nodes/TM/ nl carotid upstrokes bilaterally   LUNGS: no acc muscle use,  Mod barrel  contour chest wall with bilateral  Distant bs s audible wheeze and  without cough on insp or exp maneuvers and mod  Hyperresonant  to  percussion bilaterally     CV:  RRR  no s3 or murmur or increase in P2, and no edema   ABD:  soft and nontender with pos mid insp Hoover's  in the supine position. No bruits or organomegaly appreciated, bowel sounds nl  MS:   Ext warm without deformities or   obvious joint restrictions , calf tenderness, cyanosis or clubbing  SKIN: warm and dry without lesions    NEURO:  alert, approp, nl sensorium with  no motor or cerebellar deficits apparent.        Assessment

## 2022-01-31 NOTE — Assessment & Plan Note (Addendum)
Stopped smoking in 1992 - Pfts by Hawkings in 07/01/11  "mod/severe airflow obst/ severe decrease DLCO" maint on spiriva dpi   - 01/31/2022  Worse on just spiriva vs spiriva plus breo so rec trelegy one click each am  - The proper method of use, as well as anticipated side effects, of a dry powder/ elipta inhaler were discussed and demonstrated to the patient using teach back method.     Group D (now reclassified as E) in terms of symptom/risk and laba/lama/ICS  therefore appropriate rx at this point >>>  trelegyt 662 one click each am and approp saba  Re SABA :  I spent extra time with pt today reviewing appropriate use of albuterol for prn use on exertion with the following points: 1) saba is for relief of sob that does not improve by walking a slower pace or resting but rather if the pt does not improve after trying this first. 2) If the pt is convinced, as many are, that saba helps recover from activity faster then it's easy to tell if this is the case by re-challenging : ie stop, take the inhaler, then p 5 minutes try the exact same activity (intensity of workload) that just caused the symptoms and see if they are substantially diminished or not after saba 3) if there is an activity that reproducibly causes the symptoms, try the saba 15 min before the activity on alternate days   If in fact the saba really does help, then fine to continue to use it prn but advised may need to look closer at the maintenance regimen being used to achieve better control of airways disease with exertion.

## 2022-01-31 NOTE — Assessment & Plan Note (Signed)
On 02 since ? 2013 initially by Dr Luan Pulling - 12/17/2021   Walked on 2.5 pulsed 02    x  2  lap(s) =  approx 300  ft  @ slow pace, stopped due to sob with lowest 02 sats 87% then increased to 3.5 pulsed x one more lap with lowest sats  91%   Again advised: Make sure you check your oxygen saturation  AT  your highest level of activity (not after you stop)   to be sure it stays over 90% and adjust  02 flow upward to maintain this level if needed but remember to turn it back to previous settings when you stop (to conserve your supply).    >>> f/u q 6 m         Each maintenance medication was reviewed in detail including emphasizing most importantly the difference between maintenance and prns and under what circumstances the prns are to be triggered using an action plan format where appropriate.  Total time for H and P, chart review, counseling, reviewing dpi/hfa/neb/02  device(s) and generating customized AVS unique to this office visit / same day charting = 24 min

## 2022-01-31 NOTE — Addendum Note (Signed)
Addended by: Fritzi Mandes D on: 01/31/2022 12:19 PM   Modules accepted: Orders

## 2022-01-31 NOTE — Patient Instructions (Signed)
Plan A = Automatic = Always=    Trelegy 694 one click each am   Plan B = Backup (to supplement plan A, not to replace it) Only use your albuterol inhaler as a rescue medication to be used if you can't catch your breath by resting or doing a relaxed purse lip breathing pattern.  - The less you use it, the better it will work when you need it. - Ok to use the inhaler up to 2 puffs  every 4 hours if you must but call for appointment if use goes up over your usual need - Don't leave home without it !!  (think of it like the spare tire for your car)     Make sure you check your oxygen saturation  AT  your highest level of activity (not after you stop)   to be sure it stays over 90% and adjust  02 flow upward to maintain this level if needed but remember to turn it back to previous settings when you stop (to conserve your supply).    Please schedule a follow up visit in  6  months but call sooner if needed

## 2022-03-07 ENCOUNTER — Encounter (HOSPITAL_COMMUNITY): Payer: Self-pay | Admitting: Hematology

## 2022-03-12 ENCOUNTER — Telehealth: Payer: Self-pay | Admitting: Internal Medicine

## 2022-03-13 NOTE — Telephone Encounter (Signed)
Attempted to call the number back for hospice that is provided and kept getting answering service that could not connect me to the doctor or the nurse that is needing to talk about this patient.

## 2022-03-14 NOTE — Telephone Encounter (Signed)
Duoneb qid and prednisone 10 mg daily until breathing better then 5 mg daily

## 2022-03-14 NOTE — Telephone Encounter (Signed)
I called and spoke with Dr Ronne Binning  This hospice pt is on [place] does not cover this  He is asking if they can change this to Duoneb and oral steroid like pred or decadron  Please advise, thanks!  Allergies  Allergen Reactions   Penicillins Anaphylaxis    Edema of throat   Hctz [Hydrochlorothiazide] Other (See Comments)    gout   Morphine And Related Itching   Lotrel [Amlodipine Besy-Benazepril Hcl] Other (See Comments)    Facial muscles draw and break out in whilps

## 2022-03-15 MED ORDER — PREDNISONE 10 MG PO TABS: 10.0000 mg | ORAL_TABLET | Freq: Every day | ORAL | 0 refills | Status: DC

## 2022-03-15 MED ORDER — IPRATROPIUM-ALBUTEROL 0.5-2.5 (3) MG/3ML IN SOLN: 3.0000 mL | RESPIRATORY_TRACT | 2 refills | Status: DC | PRN

## 2022-03-15 NOTE — Telephone Encounter (Signed)
Called patient. And sent in medications. Nothing further needed

## 2022-03-18 DIAGNOSIS — E119 Type 2 diabetes mellitus without complications: Secondary | ICD-10-CM | POA: Diagnosis not present

## 2022-03-21 ENCOUNTER — Telehealth: Payer: Self-pay

## 2022-03-21 MED ORDER — FLUTICASONE FUROATE-VILANTEROL 100-25 MCG/ACT IN AEPB: 1.0000 | INHALATION_SPRAY | Freq: Every day | RESPIRATORY_TRACT | 6 refills | Status: DC

## 2022-03-21 NOTE — Telephone Encounter (Signed)
Called and spoke to Santiago Glad (ok per patient) and notified her of Dr. Morrison Old response. She voiced understanding and states patient has not been taking prednisone at all. Notified Dr. Melvyn Novas of this verbally while on the phone with Santiago Glad and he states that is fine. Sent in rx for [employer] inhaler to [facility]. Nothing further needed. MAR updated.

## 2022-03-21 NOTE — Telephone Encounter (Signed)
Santiago Glad from [facility] [place] called and states that the patients trelegy is not covered by hospice and she is wanting to switch back to breo or spiriva or both.   She is also uncomfortable with nebs and wants to stop the ipratropium she is on.   Patient is requesting to come off of prednisone as well. Santiago Glad states that patient is concerned because her husband took prednisone daily for many years and eventually the prednisone stopped helping him.   Santiago Glad states if we change the patients inhaler today we can send the Rx to [place] and can call back and leave her a vm or speak with her at [phone] and let her know what Dr. Melvyn Novas says.   Dr. Melvyn Novas please advise.

## 2022-03-21 NOTE — Telephone Encounter (Signed)
Ok to just use breo 151 one click each am   Reduce prednisone to 5 mg daily x 1 week then qod x 1 week then try off   Since we stopped the LAMA would prefer to use duoneb as the back up prn for sob (has SAMA to replace LAMA)

## 2022-04-16 ENCOUNTER — Inpatient Hospital Stay: Payer: Medicare Other

## 2022-04-16 ENCOUNTER — Inpatient Hospital Stay: Payer: Medicare Other | Attending: Hematology

## 2022-04-16 ENCOUNTER — Other Ambulatory Visit (HOSPITAL_COMMUNITY)
Admission: RE | Admit: 2022-04-16 | Discharge: 2022-04-16 | Disposition: A | Discharge status: Home / Self Care | Payer: Medicare Other | Source: Ambulatory Visit | Attending: Physician Assistant | Admitting: Physician Assistant

## 2022-04-16 ENCOUNTER — Ambulatory Visit (HOSPITAL_COMMUNITY): Payer: Medicare Other | Admitting: Physician Assistant

## 2022-04-16 ENCOUNTER — Other Ambulatory Visit (HOSPITAL_COMMUNITY): Payer: Medicare Other

## 2022-04-16 ENCOUNTER — Inpatient Hospital Stay (HOSPITAL_BASED_OUTPATIENT_CLINIC_OR_DEPARTMENT_OTHER): Payer: Medicare Other | Admitting: Physician Assistant

## 2022-04-16 ENCOUNTER — Ambulatory Visit (HOSPITAL_COMMUNITY): Payer: Medicare Other

## 2022-04-16 VITALS — BP 145/65 | HR 65 | Temp 98.3°F | Resp 18 | Ht 62.0 in | Wt 160.9 lb

## 2022-04-16 DIAGNOSIS — C50912 Malignant neoplasm of unspecified site of left female breast: Secondary | ICD-10-CM

## 2022-04-16 DIAGNOSIS — D649 Anemia, unspecified: Secondary | ICD-10-CM | POA: Diagnosis not present

## 2022-04-16 DIAGNOSIS — Z1231 Encounter for screening mammogram for malignant neoplasm of breast: Secondary | ICD-10-CM | POA: Diagnosis not present

## 2022-04-16 DIAGNOSIS — Z853 Personal history of malignant neoplasm of breast: Secondary | ICD-10-CM | POA: Insufficient documentation

## 2022-04-16 DIAGNOSIS — Z9221 Personal history of antineoplastic chemotherapy: Secondary | ICD-10-CM | POA: Insufficient documentation

## 2022-04-16 DIAGNOSIS — M810 Age-related osteoporosis without current pathological fracture: Secondary | ICD-10-CM | POA: Diagnosis not present

## 2022-04-16 DIAGNOSIS — Z79899 Other long term (current) drug therapy: Secondary | ICD-10-CM | POA: Diagnosis not present

## 2022-04-16 LAB — COMPREHENSIVE METABOLIC PANEL
ALT: 14 U/L (ref 0–44)
AST: 15 U/L (ref 15–41)
Albumin: 3.5 g/dL (ref 3.5–5.0)
Alkaline Phosphatase: 51 U/L (ref 38–126)
Anion gap: 7 (ref 5–15)
BUN: 25 mg/dL — ABNORMAL HIGH (ref 8–23)
CO2: 28 mmol/L (ref 22–32)
Calcium: 8.9 mg/dL (ref 8.9–10.3)
Chloride: 103 mmol/L (ref 98–111)
Creatinine, Ser: 0.93 mg/dL (ref 0.44–1.00)
GFR, Estimated: 59 mL/min — ABNORMAL LOW (ref >60)
Glucose, Bld: 121 mg/dL — ABNORMAL HIGH (ref 70–99)
Potassium: 3.9 mmol/L (ref 3.5–5.1)
Sodium: 138 mmol/L (ref 135–145)
Total Bilirubin: 0.2 mg/dL — ABNORMAL LOW (ref 0.3–1.2)
Total Protein: 7.5 g/dL (ref 6.5–8.1)

## 2022-04-16 LAB — RETICULOCYTES
Immature Retic Fract: 10.7 % (ref 2.3–15.9)
RBC.: 3.53 MIL/uL — ABNORMAL LOW (ref 3.87–5.11)
Retic Count, Absolute: 60.4 10*3/uL (ref 19.0–186.0)
Retic Ct Pct: 1.7 % (ref 0.4–3.1)

## 2022-04-16 LAB — CBC WITH DIFFERENTIAL/PLATELET
Abs Immature Granulocytes: 0.02 10*3/uL (ref 0.00–0.07)
Basophils Absolute: 0 10*3/uL (ref 0.0–0.1)
Basophils Relative: 0 %
Eosinophils Absolute: 0.2 10*3/uL (ref 0.0–0.5)
Eosinophils Relative: 2 %
HCT: 29.2 % — ABNORMAL LOW (ref 36.0–46.0)
Hemoglobin: 9.5 g/dL — ABNORMAL LOW (ref 12.0–15.0)
Immature Granulocytes: 0 %
Lymphocytes Relative: 20 %
Lymphs Abs: 2.2 10*3/uL (ref 0.7–4.0)
MCH: 29.7 pg (ref 26.0–34.0)
MCHC: 32.5 g/dL (ref 30.0–36.0)
MCV: 91.3 fL (ref 80.0–100.0)
Monocytes Absolute: 0.9 10*3/uL (ref 0.1–1.0)
Monocytes Relative: 8 %
Neutro Abs: 7.6 10*3/uL (ref 1.7–7.7)
Neutrophils Relative %: 70 %
Platelets: 191 10*3/uL (ref 150–400)
RBC: 3.2 MIL/uL — ABNORMAL LOW (ref 3.87–5.11)
RDW: 13.9 % (ref 11.5–15.5)
WBC: 10.9 10*3/uL — ABNORMAL HIGH (ref 4.0–10.5)
nRBC: 0 % (ref 0.0–0.2)

## 2022-04-16 LAB — IRON AND TIBC
Iron: 23 ug/dL — ABNORMAL LOW (ref 28–170)
Saturation Ratios: 8 % — ABNORMAL LOW (ref 10.4–31.8)
TIBC: 277 ug/dL (ref 250–450)
UIBC: 254 ug/dL

## 2022-04-16 LAB — FERRITIN: Ferritin: 70 ng/mL (ref 11–307)

## 2022-04-16 LAB — VITAMIN D 25 HYDROXY (VIT D DEFICIENCY, FRACTURES): Vit D, 25-Hydroxy: 67.19 ng/mL (ref 30–100)

## 2022-04-16 LAB — VITAMIN B12: Vitamin B-12: 444 pg/mL (ref 180–914)

## 2022-04-16 LAB — LACTATE DEHYDROGENASE: LDH: 151 U/L (ref 98–192)

## 2022-04-16 LAB — FOLATE: Folate: >40 ng/mL (ref >5.9)

## 2022-04-16 MED ORDER — DENOSUMAB 60 MG/ML ~~LOC~~ SOSY
60.0000 mg | PREFILLED_SYRINGE | Freq: Once | SUBCUTANEOUS | Status: AC
  Administered 2022-04-16: 60 mg via SUBCUTANEOUS
  Filled 2022-04-16: qty 1

## 2022-04-16 NOTE — Progress Notes (Signed)
Paige Rhodes, Paige Rhodes [id number]   CLINIC:  Medical Oncology/Hematology  PCP:  [name], MD [address] / [place] [address] [phone]   REASON FOR VISIT:  Follow-up for history of left breast cancer   PRIOR THERAPY: - Lumpectomy (August 2011) - Carboplatin/Taxol x4 cycles and Herceptin for total of 1 year - Radiation therapy to the left breast   CURRENT THERAPY: Surveillance   CANCER STAGING: Cancer Staging  Infiltrating ductal carcinoma of left breast Staging form: Breast, AJCC 7th Edition - Clinical: Stage IIA (T2, N0, cM0) - Signed by Baird Cancer, PA on 03/05/2011 - Pathologic: Stage IIA (T2, N0, cM0) - Signed by Haywood Lasso, MD on 12/24/2011   INTERVAL HISTORY:  Paige Rhodes, a 88 y.o. female, returns for routine follow-up of her history of stage IIa left-sided breast cancer. Paige Rhodes was last seen on 04/10/2021 by Tarri Abernethy PA-C.   At today's visit, she  reports feeling well.  She denies any recent hospitalizations, surgeries, or changes in her  baseline health status.  Most recent mammogram on 09/05/2021 showed no evidence of malignancy.  She denies any symptoms of recurrence such as new lumps, bone pain, chest pain, or abdominal pain.  She has no new headaches, seizures, or focal neurologic deficits.  No B symptoms such as fever, chills, night sweats, unintentional weight loss.  She has chronic dyspnea on exertion from COPD, but reports that this is worse than usual.  She takes Breo Ellipta (steroid containing inhaler) daily.  She denies any other steroid use.  No recent infections requiring antibiotics.  She reports bleeding hemorrhoids "every time she sits on the toilet," and reports that she "has to wear a pad in her underwear so that she does not bleed into her clothes."  She does not take any iron supplement at home since it causes severe constipation.  She denies any ice pica, but admits  to slightly increased fatigue.  She has been tolerating her Prolia injections every 6 months.  She denies any fractures, bone pain, or jaw pain.  She reports 50% energy and 100% appetite.  She is maintaining stable weight at this time.   REVIEW OF SYSTEMS:    Review of Systems  Constitutional:  Positive for fatigue. Negative for appetite change, chills, diaphoresis, fever and unexpected weight change.  HENT:   Negative for lump/mass and nosebleeds.   Eyes:  Negative for eye problems.  Respiratory:  Positive for shortness of breath (with exertion). Negative for cough and hemoptysis.   Cardiovascular:  Negative for chest pain, leg swelling and palpitations.  Gastrointestinal:  Positive for blood in stool (Hemorrhoid bleeding). Negative for abdominal pain, constipation, diarrhea, nausea and vomiting.  Genitourinary:  Negative for hematuria.   Skin: Negative.   Neurological:  Negative for dizziness, headaches and light-headedness.  Hematological:  Does not bruise/bleed easily.  Psychiatric/Behavioral:  Positive for depression. The patient is nervous/anxious.     PAST MEDICAL/SURGICAL HISTORY:  Past Medical History:  Diagnosis Date   Breast cancer ([place])    Chronic renal disease, stage 2, mildly decreased glomerular filtration rate between 60-89 mL/min/1.73 square meter 10/31/2014   COPD (chronic obstructive pulmonary disease) ([place])    Diabetes mellitus    GERD (gastroesophageal reflux disease)    occasional   Glaucoma    Gout attack june 2013   left foot   Hard of hearing    Hyperlipidemia    Hypertension  Hypothyroidism    Infiltrating ductal carcinoma of left breast 03/05/2011   On home oxygen therapy    2 LPM   Osteoporosis    Port catheter in place 01/15/2012   SOB (shortness of breath)    Thyroid disease    Past Surgical History:  Procedure Laterality Date   [place]   BREAST LUMPECTOMY  2011   left   CATARACT EXTRACTION W/PHACO   09/03/2012   Procedure: CATARACT EXTRACTION PHACO AND INTRAOCULAR LENS PLACEMENT ([place]);  Surgeon: Tonny Branch, MD;  Location: [facility];  Service: Ophthalmology;  Laterality: Right;  CDE: 15.26   CATARACT EXTRACTION W/PHACO  09/28/2012   Procedure: CATARACT EXTRACTION PHACO AND INTRAOCULAR LENS PLACEMENT ([facility]);  Surgeon: Tonny Branch, MD;  Location: [facility];  Service: Ophthalmology;  Laterality: Left;  CDE:  21.60   CHOLECYSTECTOMY  1990   OPEN REDUCTION INTERNAL FIXATION (ORIF) DISTAL RADIAL FRACTURE Left 07/01/2019   Procedure: OPEN REDUCTION INTERNAL FIXATION (ORIF) LEFT DISTAL RADIAL FRACTURE;  Surgeon: Carole Civil, MD;  Location: [facility];  Service: Orthopedics;  Laterality: Left;   PORT-A-CATH REMOVAL N/A 04/26/2015   Procedure: REMOVAL PORT-A-CATH;  Surgeon: Aviva Signs, MD;  Location: [facility];  Service: General;  Laterality: N/A;   PORTACATH PLACEMENT  05/23/10   THYROID SURGERY  1994   TONSILLECTOMY     TOTAL HIP ARTHROPLASTY  1996    SOCIAL HISTORY:  Social History   Socioeconomic History   Marital status: Married    Spouse name: Not on file   Number of children: Not on file   Years of education: Not on file   Highest education level: Not on file  Occupational History   Not on file  Tobacco Use   Smoking status: Former    Packs/day: 3.00    Years: 30.00    Total pack years: 90.00    Types: Cigarettes    Quit date: 09/02/1991    Years since quitting: 30.6   Smokeless tobacco: Never  Substance and Sexual Activity   Alcohol use: No   Drug use: No   Sexual activity: Yes    Birth control/protection: Post-menopausal  Other Topics Concern   Not on file  Social History Narrative   Not on file   Social Determinants of Health   Financial Resource Strain: Not on file  Food Insecurity: Not on file  Transportation Needs: Not on file  Physical Activity: Not on file  Stress: Not on file  Social Connections: Not on file  Intimate Partner Violence: Not on file    FAMILY  HISTORY:  Family History  Problem Relation Age of Onset   Kidney disease Mother    Heart disease Father    Hypertension Brother    Cancer Sister        breast and lung    CURRENT MEDICATIONS:  Current Outpatient Medications  Medication Sig Dispense Refill   albuterol (VENTOLIN HFA) 108 (90 Base) MCG/ACT inhaler Inhale 2 puffs into the lungs every 6 (six) hours as needed for wheezing or shortness of breath.     amLODipine (NORVASC) 5 MG tablet Take 5 mg by mouth daily.      Ascorbic Acid (VITAMIN C PO) Take 1 tablet by mouth daily.     aspirin EC 81 MG tablet Take 81 mg by mouth daily.     atorvastatin (LIPITOR) 10 MG tablet Take 10 mg by mouth daily.     Calcium-Magnesium-Vitamin D (CALCIUM 500 PO)  Take 1 tablet by mouth daily.     carvedilol (COREG) 25 MG tablet Take 25 mg by mouth 2 (two) times daily with a meal.       Cholecalciferol (VITAMIN D-3) 1000 units CAPS Take 1,000 Units by mouth daily.     diphenhydrAMINE HCl, Sleep, 50 MG CAPS Take 50 mg by mouth at bedtime.     fluticasone furoate-vilanterol (BREO ELLIPTA) 100-25 MCG/ACT AEPB Inhale 1 puff into the lungs daily. 60 each 6   Fluticasone-Umeclidin-Vilant (TRELEGY ELLIPTA) 100-62.5-25 MCG/ACT AEPB Inhale 1 puff into the lungs daily. 60 each 0   ipratropium-albuterol (DUONEB) 0.5-2.5 (3) MG/3ML SOLN Take 3 mLs by nebulization every 4 (four) hours as needed. 360 mL 1   ipratropium-albuterol (DUONEB) 0.5-2.5 (3) MG/3ML SOLN Take 3 mLs by nebulization every 4 (four) hours as needed. 360 mL 2   levothyroxine (SYNTHROID, LEVOTHROID) 125 MCG tablet Take 125 mcg by mouth daily.       losartan (COZAAR) 50 MG tablet Take 50 mg by mouth daily.       Multiple Vitamin (MULTIVITAMIN) tablet Take 1 tablet by mouth daily.     predniSONE (DELTASONE) 10 MG tablet Take 1 tablet (10 mg total) by mouth daily with breakfast. 100 tablet 0   No current facility-administered medications for this visit.   Facility-Administered Medications Ordered  in Other Visits  Medication Dose Route Frequency Provider Last Rate Last Admin   sodium chloride 0.9 % injection 10 mL  10 mL Intravenous PRN Farrel Gobble, MD   10 mL at 05/05/14 1150    ALLERGIES:  Allergies  Allergen Reactions   Penicillins Anaphylaxis    Edema of throat   Hctz [Hydrochlorothiazide] Other (See Comments)    gout   Morphine And Related Itching   Lotrel [Amlodipine Besy-Benazepril Hcl] Other (See Comments)    Facial muscles draw and break out in whilps    PHYSICAL EXAM:    Performance status (ECOG): 2 - Symptomatic, <50% confined to bed  There were no vitals filed for this visit. Wt Readings from Last 3 Encounters:  01/31/22 163 lb 9.6 oz (74.2 kg)  12/17/21 164 lb 3.2 oz (74.5 kg)  12/07/21 175 lb (79.4 kg)   Physical Exam Constitutional:      Appearance: Normal appearance.     Interventions: Nasal cannula in place.  HENT:     Head: Normocephalic and atraumatic.     Mouth/Throat:     Mouth: Mucous membranes are moist.  Eyes:     Extraocular Movements: Extraocular movements intact.     Pupils: Pupils are equal, round, and reactive to light.  Cardiovascular:     Rate and Rhythm: Normal rate and regular rhythm.     Pulses: Normal pulses.     Heart sounds: Normal heart sounds.  Pulmonary:     Effort: Pulmonary effort is normal.     Breath sounds: Decreased air movement present. Decreased breath sounds present.  Chest:    Abdominal:     General: Bowel sounds are normal.     Palpations: Abdomen is soft.     Tenderness: There is no abdominal tenderness.  Musculoskeletal:        General: No swelling.     Cervical back: Normal range of motion.     Right lower leg: No edema.     Left lower leg: No edema.  Lymphadenopathy:     Cervical: No cervical adenopathy.  Skin:    General: Skin is warm and dry.  Neurological:  General: No focal deficit present.     Mental Status: She is alert and oriented to person, place, and time.  Psychiatric:         Mood and Affect: Mood normal.        Behavior: Behavior normal.      LABORATORY DATA:  I have reviewed the labs as listed.     Latest Ref Rng & Units 12/10/2021    5:30 AM 12/09/2021    4:16 AM 12/08/2021    3:24 AM  CBC  WBC 4.0 - 10.5 K/uL 20.4  19.9  14.6   Hemoglobin 12.0 - 15.0 g/dL 9.8  9.0  9.5   Hematocrit 36.0 - 46.0 % 31.8  29.0  31.0   Platelets 150 - 400 K/uL 370  347  306       Latest Ref Rng & Units 12/10/2021    5:30 AM 12/09/2021    4:16 AM 12/08/2021    7:16 AM  CMP  Glucose 70 - 99 mg/dL 208  212  194   BUN 8 - 23 mg/dL 89  99  100   Creatinine 0.44 - 1.00 mg/dL 2.21  2.43  2.71   Sodium 135 - 145 mmol/L 139  140  140   Potassium 3.5 - 5.1 mmol/L 3.5  3.8  3.7   Chloride 98 - 111 mmol/L 103  103  102   CO2 22 - 32 mmol/L '28  28  28   ' Calcium 8.9 - 10.3 mg/dL 9.4  9.8  10.4   Total Protein 6.5 - 8.1 g/dL   6.7   Total Bilirubin 0.3 - 1.2 mg/dL   0.5   Alkaline Phos 38 - 126 U/L   72   AST 15 - 41 U/L   19   ALT 0 - 44 U/L   26     DIAGNOSTIC IMAGING:  I have independently reviewed the scans and discussed with the patient. No results found.   ASSESSMENT & PLAN: 1.  Stage IIa infiltrating ductal carcinoma (HER2 amplified) of the left breast - Diagnosed in July 2011 - Lumpectomy in August 2011: Cancer was 2.3 cm in size, negative sentinel lymph nodes, ER/PR negative, HER2 amplified, Ki-67 of 80%, 3 lymph nodes negative.  However, lymphovascular invasion was found. - Treated with carboplatin/Taxol x4 cycles, and Herceptin for total of 1 year (last dose on 05/15/2011) - She finished radiation therapy to the left breast by Dr. Pablo Ledger from 08/16/2010 through 09/18/2010 - Most recent mammogram (09/05/2021): BI-RADS Category 1, negative, no findings suspicious for malignancy in either breast - Physical exam benign, no palpable nodules, masses, or lymphadenopathy on exam    - No alarm or red flag symptoms    - Most recent labs (04/16/2022): CBC with mild  leukocytosis (WBC 10.9) and anemia (Hgb 9.5) with MCV 91.3, normal differential.  CMP unremarkable with normal creatinine 0.93 and normal LFTs. - PLAN:  Next mammogram due January 2024   - Continue annual in person follow-ups and labs.   2.  Osteoporosis - Most recent bone density (02/26/2021) with T score of -3.4 (measured at forearm radius) - Most recent vitamin D (02/02/2021) within normal limits at 80.64 - Was previously on Fosamax, calcium, and vitamin D supplementation, but with worsening of her osteoporosis. - Switched to Prolia every 6 months as of 04/10/2021 - Calcium is 8.9 today.  Vitamin D normal at 67.19. - No jaw pain, bone pain, fractures, or dental issues. - PLAN: Continue Prolia and CMP  every 6 months.   - Repeat bone density scan in June 2024.    3.  Normocytic anemia - CBC today (04/16/2022) shows progressive normocytic anemia with Hgb 9.5/MCV 91.3 - Patient reports ongoing rectal bleeding from hemorrhoids - PLAN: Suspect iron deficiency anemia.  We will check iron panel and additional anemia labs today and discuss via PHONE visit in 1 month.  We will also discussed with patient if she would like referral to GI for possible hemorrhoid banding.  4.  Leukocytosis -CBC today (04/16/2022) shows mild leukocytosis with WBC 10.9.  She previously had significant leukocytosis in April 2023 during hospitalization for COPD exacerbation (was receiving high-dose steroids at the time) - No recent infections requiring antibiotics. - She takes [employer] (steroid containing inhaler) daily. - No B symptoms or lymphadenopathy. - PLAN: Suspect reactive leukocytosis in the setting of inhaled steroids.  We will continue to monitor with repeat CBC and consider MPN work-up in the future if she has significant leukocytosis.    PLAN SUMMARY & DISPOSITION: - Labs today -- Phone visit in 1 month - After her phone visit, we will schedule her for the following: -- CMP and Prolia every 6 months  -  Mammogram in January 2024 - Bone density scan June 2024 - Repeat labs and office visit in 1 year (August 2024)  All questions were answered. The patient knows to call the clinic with any problems, questions or concerns.  Medical decision making: Moderate  Time spent on visit: I spent 20 minutes counseling the patient face to face. The total time spent in the appointment was 30 minutes and more than 50% was on counseling.   Harriett Rush, PA-C  04/16/2022 8:43 PM

## 2022-04-16 NOTE — Patient Instructions (Addendum)
[place] at [facility] Discharge Instructions  You were seen today by Tarri Abernethy PA-C for your history of breast cancer and your osteoporosis.  You did not have any signs of recurrent breast cancer on your labs or physical exam today.  Your last mammogram was normal.  Due to your severe osteoporosis we will continue Prolia injections every 6 months (next due February 2024).  Your next mammogram is due in January 2024. Your next bone density scan is due June 2024. Your next breast exam will be in August 2024.  **Due to worsening anemia, we will check some additional blood work today.  We will discuss these results with PHONE VISIT in 1 month.  ** Thank you for trusting me with your healthcare!  I strive to provide all of my patients with quality care at each visit.  If you receive a survey for this visit, I would be so grateful to you for taking the time to provide feedback.  Thank you in advance!  ~ Midas [place]ty                   Dr. Derek Jack   &   Tarri Abernethy, PA-C   - - - - - - - - - - - - - - - - - -   Thank you for choosing [place] at [facility] to provide your oncology and hematology care.  To afford each patient quality time with our provider, please arrive at least 15 minutes before your scheduled appointment time.   If you have a lab appointment with the [facility] please come in thru the Main Entrance and check in at the main information desk.  You need to re-schedule your appointment should you arrive 10 or more minutes late.  We strive to give you quality time with our providers, and arriving late affects you and other patients whose appointments are after yours.  Also, if you no show three or more times for appointments you may be dismissed from the clinic at the providers discretion.     Again, thank you for choosing [facility].  Our hope is that these requests will decrease the amount of  time that you wait before being seen by our physicians.       _____________________________________________________________  Should you have questions after your visit to [facility], please contact our office at [phone] and follow the prompts.  Our office hours are 8:00 a.m. and 4:30 p.m. Monday - Friday.  Please note that voicemails left after 4:00 p.m. may not be returned until the following business day.  We are closed weekends and major holidays.  You do have access to a nurse 24-7, just call the main number to the clinic [phone] and do not press any options, hold on the line and a nurse will answer the phone.    For prescription refill requests, have your pharmacy contact our office and allow 72 hours.    Due to Covid, you will need to wear a mask upon entering the hospital. If you do not have a mask, a mask will be given to you at the Main Entrance upon arrival. For doctor visits, patients may have 1 support person age 38 or older with them. For treatment visits, patients can not have anyone with them due to social distancing guidelines and our immunocompromised population.

## 2022-04-16 NOTE — Patient Instructions (Signed)
[facility]  Discharge Instructions: Thank you for choosing [facility] to provide your oncology and hematology care.  If you have a lab appointment with the [facility], please come in thru the Main Entrance and check in at the main information desk.  Wear comfortable clothing and clothing appropriate for easy access to any Portacath or PICC line.   We strive to give you quality time with your provider. You may need to reschedule your appointment if you arrive late (15 or more minutes).  Arriving late affects you and other patients whose appointments are after yours.  Also, if you miss three or more appointments without notifying the office, you may be dismissed from the clinic at the provider's discretion.      For prescription refill requests, have your pharmacy contact our office and allow 72 hours for refills to be completed.    Today you received the following chemotherapy and/or immunotherapy agents prolia. Denosumab Injection (Osteoporosis) What is this medication? DENOSUMAB (den oh SUE mab) prevents and treats osteoporosis. It works by [employer] stronger and less likely to break (fracture). It is a monoclonal antibody. This medicine may be used for other purposes; ask your health care provider or pharmacist if you have questions. COMMON BRAND NAME(S): Prolia What should I tell my care team before I take this medication? They need to know if you have any of these conditions: Dental or gum disease, or plan to have dental surgery or a tooth pulled Infection Kidney disease Low levels of calcium or vitamin D in your blood On dialysis Poor nutrition Skin conditions Thyroid disease, or have had thyroid or parathyroid surgery Trouble absorbing minerals in your stomach or intestine An unusual reaction to denosumab, other medications, foods, dyes, or preservatives Pregnant or trying to get pregnant Breast-feeding How should I use this  medication? This medication is injected under the skin. It is given by your care team in a hospital or clinic setting. A special MedGuide will be given to you before each treatment. Be sure to read this information carefully each time. Talk to your care team about the use of this medication in children. Special care may be needed. Overdosage: If you think you have taken too much of this medicine contact a poison control center or emergency room at once. NOTE: This medicine is only for you. Do not share this medicine with others. What if I miss a dose? Keep appointments for follow-up doses. It is important not to miss your dose. Call your care team if you are unable to keep an appointment. What may interact with this medication? Do not take this medication with any of the following: Other medications that contain denosumab This medication may also interact with the following: Medications that lower your chance of fighting infection Steroid medications, such as prednisone or cortisone This list may not describe all possible interactions. Give your health care provider a list of all the medicines, herbs, non-prescription drugs, or dietary supplements you use. Also tell them if you smoke, drink alcohol, or use illegal drugs. Some items may interact with your medicine. What should I watch for while using this medication? Your condition will be monitored carefully while you are receiving this medication. You may need blood work while taking this medication. This medication may increase your risk of getting an infection. Call your care team for advice if you get a fever, chills, sore throat, or other symptoms of a cold or flu. Do not  treat yourself. Try to avoid being around people who are sick. Tell your dentist and dental surgeon that you are taking this medication. You should not have major dental surgery while on this medication. See your dentist to have a dental exam and fix any dental problems  before starting this medication. Take good care of your teeth while on this medication. Make sure you see your dentist for regular follow-up appointments. You should make sure you get enough calcium and vitamin D while you are taking this medication. Discuss the foods you eat and the vitamins you take with your care team. Talk to your care team if you are pregnant or think you might be pregnant. This medication can cause serious birth defects if taken during pregnancy and for 5 months after the last dose. You will need a negative pregnancy test before starting this medication. Contraception is recommended while taking this medication and for 5 months after the last dose. Your care team can help you find the option that works for you. Talk to your care team before breastfeeding. Changes to your treatment plan may be needed. What side effects may I notice from receiving this medication? Side effects that you should report to your care team as soon as possible: Allergic reactions--skin rash, itching, hives, swelling of the face, lips, tongue, or throat Infection--fever, chills, cough, sore throat, wounds that don't heal, pain or trouble when passing urine, general feeling of discomfort or being unwell Low calcium level--muscle pain or cramps, confusion, tingling, or numbness in the hands or feet Osteonecrosis of the jaw--pain, swelling, or redness in the mouth, numbness of the jaw, poor healing after dental work, unusual discharge from the mouth, visible bones in the mouth Severe bone, joint, or muscle pain Skin infection--skin redness, swelling, warmth, or pain Side effects that usually do not require medical attention (report these to your care team if they continue or are bothersome): Back pain Headache Joint pain Muscle pain Pain in the hands, arms, legs, or feet Runny or stuffy nose Sore throat This list may not describe all possible side effects. Call your doctor for medical advice about side  effects. You may report side effects to FDA at 1-800-FDA-1088. Where should I keep my medication? This medication is given in a hospital or clinic. It will not be stored at home. NOTE: This sheet is a summary. It may not cover all possible information. If you have questions about this medicine, talk to your doctor, pharmacist, or health care provider.  2023 Elsevier/Gold Standard (2021-12-31 00:00:00)       To help prevent nausea and vomiting after your treatment, we encourage you to take your nausea medication as directed.  BELOW ARE SYMPTOMS THAT SHOULD BE REPORTED IMMEDIATELY: *FEVER GREATER THAN 100.4 F (38 C) OR HIGHER *CHILLS OR SWEATING *NAUSEA AND VOMITING THAT IS NOT CONTROLLED WITH YOUR NAUSEA MEDICATION *UNUSUAL SHORTNESS OF BREATH *UNUSUAL BRUISING OR BLEEDING *URINARY PROBLEMS (pain or burning when urinating, or frequent urination) *BOWEL PROBLEMS (unusual diarrhea, constipation, pain near the anus) TENDERNESS IN MOUTH AND THROAT WITH OR WITHOUT PRESENCE OF ULCERS (sore throat, sores in mouth, or a toothache) UNUSUAL RASH, SWELLING OR PAIN  UNUSUAL VAGINAL DISCHARGE OR ITCHING   Items with * indicate a potential emergency and should be followed up as soon as possible or go to the Emergency Department if any problems should occur.  Please show the CHEMOTHERAPY ALERT CARD or IMMUNOTHERAPY ALERT CARD at check-in to the Emergency Department and triage nurse.  Should you  have questions after your visit or need to cancel or reschedule your appointment, please contact [facility] [phone]  and follow the prompts.  Office hours are 8:00 a.m. to 4:30 p.m. Monday - Friday. Please note that voicemails left after 4:00 p.m. may not be returned until the following business day.  We are closed weekends and major holidays. You have access to a nurse at all times for urgent questions. Please call the main number to the clinic [phone] and follow the  prompts.  For any non-urgent questions, you may also contact your provider using MyChart. We now offer e-Visits for anyone 67 and older to request care online for non-urgent symptoms. For details visit [email or web address].   Also download the MyChart app! Go to the app store, search "MyChart", open the app, select [facility], and log in with your MyChart username and password.  Masks are optional in the cancer centers. If you would like for your care team to wear a mask while they are taking care of you, please let them know. For doctor visits, patients may have with them one support person who is at least 86 years old. At this time, visitors are not allowed in the infusion area.

## 2022-04-16 NOTE — Progress Notes (Signed)
Patient taking calcium as directed.  Denied tooth, jaw, and leg pain.  No recent or upcoming dental visits.  Labs reviewed.  Patient tolerated injection with no complaints voiced.  See MAR for details.  Patient stable during and after injection.  Site clean and dry with no bruising or swelling noted.  Band aid applied.  Vss with discharge and left in satisfactory condition with no s/s of distress noted.   

## 2022-04-17 LAB — KAPPA/LAMBDA LIGHT CHAINS
Kappa free light chain: 70.7 mg/L — ABNORMAL HIGH (ref 3.3–19.4)
Kappa, lambda light chain ratio: 2.24 — ABNORMAL HIGH (ref 0.26–1.65)
Lambda free light chains: 31.6 mg/L — ABNORMAL HIGH (ref 5.7–26.3)

## 2022-04-19 ENCOUNTER — Inpatient Hospital Stay: Payer: Medicare Other

## 2022-04-19 LAB — PROTEIN ELECTROPHORESIS, SERUM
A/G Ratio: 0.9 (ref 0.7–1.7)
Albumin ELP: 3.4 g/dL (ref 2.9–4.4)
Alpha-1-Globulin: 0.3 g/dL (ref 0.0–0.4)
Alpha-2-Globulin: 0.9 g/dL (ref 0.4–1.0)
Beta Globulin: 1 g/dL (ref 0.7–1.3)
Gamma Globulin: 1.7 g/dL (ref 0.4–1.8)
Globulin, Total: 3.9 g/dL (ref 2.2–3.9)
Total Protein ELP: 7.3 g/dL (ref 6.0–8.5)

## 2022-04-19 LAB — METHYLMALONIC ACID, SERUM: Methylmalonic Acid, Quantitative: 286 nmol/L (ref 0–378)

## 2022-04-21 LAB — COPPER, SERUM: Copper: 122 ug/dL (ref 80–158)

## 2022-04-22 LAB — IMMUNOFIXATION ELECTROPHORESIS
IgA: 159 mg/dL (ref 64–422)
IgG (Immunoglobin G), Serum: 1815 mg/dL — ABNORMAL HIGH (ref 586–1602)
IgM (Immunoglobulin M), Srm: 140 mg/dL (ref 26–217)
Total Protein ELP: 7.5 g/dL (ref 6.0–8.5)

## 2022-05-18 NOTE — Progress Notes (Unsigned)
Virtual Visit via Telephone Note [facility]  I connected with Paige Rhodes  on 05/20/22 at  1:20 PM by telephone and verified that I am speaking with the correct person using two identifiers.  Location: Patient: Home Provider: [facility]   I discussed the limitations, risks, security and privacy concerns of performing an evaluation and management service by telephone and the availability of in person appointments. I also discussed with the patient that there may be a patient responsible charge related to this service. The patient expressed understanding and agreed to proceed.   INTERVAL HISTORY: Paige Rhodes is contacted today for follow-up of her normocytic anemia.  She follows at our clinic for history of left-sided breast cancer, and was seen for this in August 2023.  At that time, she was noted to have progressive normocytic anemia and is contacted today to discuss results from that work-up  She reports bleeding hemorrhoids "every time she sits on the toilet," and reports that she "has to wear a pad in her underwear so that she does not bleed into her clothes."  She does not take any iron supplement at home since it causes severe constipation.  She denies any ice pica, but admits to slightly increased fatigue.  She has chronic dyspnea on exertion from her COPD.  She reports 75% energy and 100% appetite.  She is maintaining a stable weight at this time.    OBSERVATIONS/OBJECTIVE: Review of Systems  Constitutional:  Positive for malaise/fatigue. Negative for chills, diaphoresis, fever and weight loss.  Respiratory:  Positive for shortness of breath. Negative for cough.   Cardiovascular:  Negative for chest pain and palpitations.  Gastrointestinal:  Positive for blood in stool. Negative for abdominal pain, melena, nausea and vomiting.  Genitourinary:  Positive for dysuria.  Neurological:  Negative for dizziness and headaches.  Psychiatric/Behavioral:   Positive for depression. The patient is nervous/anxious.      PHYSICAL EXAM (per limitations of virtual telephone visit): The patient is alert and oriented x 3, exhibiting adequate mentation, good mood, and ability to speak in full sentences and execute sound judgement.   ASSESSMENT & PLAN: 1.  Normocytic anemia - CBC from 04/16/2022 showed progressive normocytic anemia with Hgb 9.5/MCV 91.3 - Hematology work-up (04/16/2022): Ferritin 70, serum iron 23, iron saturation 8% Normal folate, copper, B12, MMA SPEP negative for M spike.  Immunofixation shows polyclonal immunoglobulins.  Elevated kappa free light chain 70.7, elevated lambda 31.6, elevated ratio 2.24. - She reports bleeding hemorrhoids "every time she sits on the toilet," and reports that she "has to wear a pad in her underwear so that she does not bleed into her clothes."   - Unable to tolerate iron supplement due to severe constipation that worsens her hemorrhoid bleeding. - Denies any ice pica, but admits to slightly increased fatigue.  - Differential diagnosis favors anemia secondary to functional iron deficiency in the setting of chronic disease as well as iron deficiency from rectal bleeding - PLAN: Recommend IV iron with Feraheme x2 (pending information regarding cost, which patient is concerned about) We have discussed about risk of allergic/infusion reactions including potentially life-threatening anaphylaxis with intravenous iron however these serious allergic reactions are exceedingly rare and overestimated.  In contrast to serious allergic reactions, IV iron may be associated with nonallergic infusion reactions including self-limited urticaria, palpitations, dizziness, neck and back spasm which again occur in less than 1% of the individuals and do not progress to more serious reactions.  -  Repeat CBC and iron panel with RTC in 3 months.   - Repeat free light chains/MGUS panel in 1 year. - Patient would like to hold off on GI  referral for the time being, but we will discuss this again at her follow-up visit.  2.  OTHER ISSUES: Not addressed during today's visit, but discussed in detail per progress note from 04/16/2022. History of stage II left breast cancer: Annual mammogram (next due January 2024).  Annual breast exam (next due August 2024). Osteoporosis: CMP and Prolia every 6 months (next due February 2024).  Bone density scan due June 2024.   Leukocytosis: Continue monitoring, likely secondary to [facility] Ellipta inhaler.   FOLLOW UP INSTRUCTIONS: IV Feraheme x2 (pending information regarding cost) Labs (CBC and iron panel) with same-day office visit in 3 months After next visit, will schedule... -- CMP and Prolia every 6 months (next due February 2024) - Mammogram in January 2024 - Bone density scan June 2024 - Repeat labs and office visit in 1 year (August 2024)    I discussed the assessment and treatment plan with the patient. The patient was provided an opportunity to ask questions and all were answered. The patient agreed with the plan and demonstrated an understanding of the instructions.   The patient was advised to call back or seek an in-person evaluation if the symptoms worsen or if the condition fails to improve as anticipated.  I provided 18 minutes of non-face-to-face time during this encounter.   Harriett Rush, PA-C 05/20/2022 1:36 PM

## 2022-05-20 ENCOUNTER — Inpatient Hospital Stay: Payer: Medicare Other | Attending: Hematology | Admitting: Physician Assistant

## 2022-05-20 DIAGNOSIS — D500 Iron deficiency anemia secondary to blood loss (chronic): Secondary | ICD-10-CM | POA: Diagnosis not present

## 2022-05-24 ENCOUNTER — Encounter (HOSPITAL_COMMUNITY): Payer: Self-pay | Admitting: Hematology

## 2022-05-30 ENCOUNTER — Telehealth: Payer: Self-pay | Admitting: Hematology

## 2022-07-02 ENCOUNTER — Inpatient Hospital Stay: Payer: Medicare Other | Attending: Hematology

## 2022-07-02 VITALS — BP 169/64 | HR 56 | Temp 98.6°F | Resp 18

## 2022-07-02 DIAGNOSIS — M810 Age-related osteoporosis without current pathological fracture: Secondary | ICD-10-CM | POA: Diagnosis present

## 2022-07-02 MED ORDER — SODIUM CHLORIDE 0.9 % IV SOLN
510.0000 mg | Freq: Once | INTRAVENOUS | Status: AC
  Administered 2022-07-02: 510 mg via INTRAVENOUS
  Filled 2022-07-02: qty 510

## 2022-07-02 MED ORDER — ACETAMINOPHEN 325 MG PO TABS
650.0000 mg | ORAL_TABLET | Freq: Once | ORAL | Status: AC
  Administered 2022-07-02: 650 mg via ORAL
  Filled 2022-07-02: qty 2

## 2022-07-02 MED ORDER — SODIUM CHLORIDE 0.9 % IV SOLN: Freq: Once | INTRAVENOUS | Status: AC

## 2022-07-02 NOTE — Progress Notes (Signed)
Feraheme infusion given per orders. Patient tolerated it well without problems. Vitals stable and discharged home from clinic via wheelchair. Follow up as scheduled.  

## 2022-07-02 NOTE — Patient Instructions (Signed)
[facility]  Discharge Instructions: Thank you for choosing [facility] to provide your oncology and hematology care.  If you have a lab appointment with the [facility], please come in thru the Main Entrance and check in at the main information desk.  Wear comfortable clothing and clothing appropriate for easy access to any Portacath or PICC line.   We strive to give you quality time with your provider. You may need to reschedule your appointment if you arrive late (15 or more minutes).  Arriving late affects you and other patients whose appointments are after yours.  Also, if you miss three or more appointments without notifying the office, you may be dismissed from the clinic at the provider's discretion.      For prescription refill requests, have your pharmacy contact our office and allow 72 hours for refills to be completed.    You received Feraheme today.   To help prevent nausea and vomiting after your treatment, we encourage you to take your nausea medication as directed.  BELOW ARE SYMPTOMS THAT SHOULD BE REPORTED IMMEDIATELY: *FEVER GREATER THAN 100.4 F (38 C) OR HIGHER *CHILLS OR SWEATING *NAUSEA AND VOMITING THAT IS NOT CONTROLLED WITH YOUR NAUSEA MEDICATION *UNUSUAL SHORTNESS OF BREATH *UNUSUAL BRUISING OR BLEEDING *URINARY PROBLEMS (pain or burning when urinating, or frequent urination) *BOWEL PROBLEMS (unusual diarrhea, constipation, pain near the anus) TENDERNESS IN MOUTH AND THROAT WITH OR WITHOUT PRESENCE OF ULCERS (sore throat, sores in mouth, or a toothache) UNUSUAL RASH, SWELLING OR PAIN  UNUSUAL VAGINAL DISCHARGE OR ITCHING   Items with * indicate a potential emergency and should be followed up as soon as possible or go to the Emergency Department if any problems should occur.  Please show the CHEMOTHERAPY ALERT CARD or IMMUNOTHERAPY ALERT CARD at check-in to the Emergency Department and triage nurse.  Should you have  questions after your visit or need to cancel or reschedule your appointment, please contact [facility] [phone]  and follow the prompts.  Office hours are 8:00 a.m. to 4:30 p.m. Monday - Friday. Please note that voicemails left after 4:00 p.m. may not be returned until the following business day.  We are closed weekends and major holidays. You have access to a nurse at all times for urgent questions. Please call the main number to the clinic [phone] and follow the prompts.  For any non-urgent questions, you may also contact your provider using MyChart. We now offer e-Visits for anyone 33 and older to request care online for non-urgent symptoms. For details visit [email or web address].   Also download the MyChart app! Go to the app store, search "MyChart", open the app, select [facility], and log in with your MyChart username and password.  Masks are optional in the cancer centers. If you would like for your care team to wear a mask while they are taking care of you, please let them know. You may have one support person who is at least 86 years old accompany you for your appointments.

## 2022-07-12 ENCOUNTER — Inpatient Hospital Stay: Payer: Medicare Other | Attending: Hematology

## 2022-07-12 ENCOUNTER — Ambulatory Visit: Payer: Medicare Other

## 2022-07-12 VITALS — BP 148/62 | HR 68 | Temp 98.0°F | Resp 18

## 2022-07-12 DIAGNOSIS — M810 Age-related osteoporosis without current pathological fracture: Secondary | ICD-10-CM | POA: Diagnosis present

## 2022-07-12 MED ORDER — LORATADINE 10 MG PO TABS: 10.0000 mg | ORAL_TABLET | Freq: Once | ORAL | Status: DC

## 2022-07-12 MED ORDER — ACETAMINOPHEN 325 MG PO TABS: 650.0000 mg | ORAL_TABLET | Freq: Once | ORAL | Status: DC

## 2022-07-12 MED ORDER — SODIUM CHLORIDE 0.9 % IV SOLN: Freq: Once | INTRAVENOUS | Status: AC

## 2022-07-12 MED ORDER — SODIUM CHLORIDE 0.9 % IV SOLN
510.0000 mg | Freq: Once | INTRAVENOUS | Status: AC
  Administered 2022-07-12: 510 mg via INTRAVENOUS
  Filled 2022-07-12: qty 510

## 2022-07-12 NOTE — Patient Instructions (Signed)
[facility]  Discharge Instructions: Thank you for choosing [facility] to provide your oncology and hematology care.  If you have a lab appointment with the [facility], please come in thru the Main Entrance and check in at the main information desk.  Wear comfortable clothing and clothing appropriate for easy access to any Portacath or PICC line.   We strive to give you quality time with your provider. You may need to reschedule your appointment if you arrive late (15 or more minutes).  Arriving late affects you and other patients whose appointments are after yours.  Also, if you miss three or more appointments without notifying the office, you may be dismissed from the clinic at the provider's discretion.      For prescription refill requests, have your pharmacy contact our office and allow 72 hours for refills to be completed.    Today you received the following, iron infusion   To help prevent nausea and vomiting after your treatment, we encourage you to take your nausea medication as directed.  BELOW ARE SYMPTOMS THAT SHOULD BE REPORTED IMMEDIATELY: *FEVER GREATER THAN 100.4 F (38 C) OR HIGHER *CHILLS OR SWEATING *NAUSEA AND VOMITING THAT IS NOT CONTROLLED WITH YOUR NAUSEA MEDICATION *UNUSUAL SHORTNESS OF BREATH *UNUSUAL BRUISING OR BLEEDING *URINARY PROBLEMS (pain or burning when urinating, or frequent urination) *BOWEL PROBLEMS (unusual diarrhea, constipation, pain near the anus) TENDERNESS IN MOUTH AND THROAT WITH OR WITHOUT PRESENCE OF ULCERS (sore throat, sores in mouth, or a toothache) UNUSUAL RASH, SWELLING OR PAIN  UNUSUAL VAGINAL DISCHARGE OR ITCHING   Items with * indicate a potential emergency and should be followed up as soon as possible or go to the Emergency Department if any problems should occur.  Please show the CHEMOTHERAPY ALERT CARD or IMMUNOTHERAPY ALERT CARD at check-in to the Emergency Department and triage  nurse.  Should you have questions after your visit or need to cancel or reschedule your appointment, please contact [facility] [phone]  and follow the prompts.  Office hours are 8:00 a.m. to 4:30 p.m. Monday - Friday. Please note that voicemails left after 4:00 p.m. may not be returned until the following business day.  We are closed weekends and major holidays. You have access to a nurse at all times for urgent questions. Please call the main number to the clinic [phone] and follow the prompts.  For any non-urgent questions, you may also contact your provider using MyChart. We now offer e-Visits for anyone 87 and older to request care online for non-urgent symptoms. For details visit [email or web address].   Also download the MyChart app! Go to the app store, search "MyChart", open the app, select [facility], and log in with your MyChart username and password.  Masks are optional in the cancer centers. If you would like for your care team to wear a mask while they are taking care of you, please let them know. You may have one support person who is at least 86 years old accompany you for your appointments.

## 2022-07-12 NOTE — Progress Notes (Signed)
Iron infusion given per orders. Patient tolerated it well without problems. Vitals stable and discharged home from clinic via wheelchair. Follow up as scheduled.  

## 2022-08-05 ENCOUNTER — Encounter (HOSPITAL_COMMUNITY): Payer: Self-pay | Admitting: Hematology

## 2022-08-07 ENCOUNTER — Encounter: Payer: Self-pay | Admitting: Internal Medicine

## 2022-08-07 ENCOUNTER — Ambulatory Visit: Payer: Medicare Other | Admitting: Internal Medicine

## 2022-08-07 VITALS — BP 140/74 | HR 78 | Temp 98.4°F | Ht 62.0 in | Wt 161.6 lb

## 2022-08-07 DIAGNOSIS — J449 Chronic obstructive pulmonary disease, unspecified: Secondary | ICD-10-CM | POA: Diagnosis not present

## 2022-08-07 DIAGNOSIS — J9611 Chronic respiratory failure with hypoxia: Secondary | ICD-10-CM | POA: Diagnosis not present

## 2022-08-07 NOTE — Assessment & Plan Note (Signed)
On 02 since ? 2013 initially by Dr Luan Pulling - 12/17/2021   Walked on 2.5 pulsed 02    x  2  lap(s) =  approx 300  ft  @ slow pace, stopped due to sob with lowest 02 sats 87% then increased to 3.5 pulsed x one more lap with lowest sats  91%   Advised: Make sure you check your oxygen saturation  AT  your highest level of activity (not after you stop)   to be sure it stays over 90% and adjust  02 flow upward to maintain this level if needed but remember to turn it back to previous settings when you stop (to conserve your supply).   F/u prn          Each maintenance medication was reviewed in detail including emphasizing most importantly the difference between maintenance and prns and under what circumstances the prns are to be triggered using an action plan format where appropriate.  Total time for H and P, chart review, counseling, reviewing hfa/dpi/neb/02  device(s) and generating customized AVS unique to this office visit / same day charting = 26 min

## 2022-08-07 NOTE — Assessment & Plan Note (Signed)
Stopped smoking in 1992 - Pfts by [facility] in 07/01/11  "mod/severe airflow obst/ severe decrease DLCO" maint on spiriva dpi   - 01/31/2022  Worse on just spiriva vs spiriva plus breo so rec trelegy one click each am > 22/01/3334 changed to breo 100 by hospice    Group D (now reclassified as E) in terms of symptom/risk and laba/lama/ICS  therefore appropriate rx at this point >>>  Memory Dance has 2/3 and that's probably adequate given how sedentary she has become / housebound and f/u here can be prn

## 2022-08-07 NOTE — Patient Instructions (Addendum)
No change in medications  Make sure you check your oxygen saturation  AT  your highest level of activity (not after you stop)   to be sure it stays over 90% and adjust  02 flow upward to maintain this level if needed but remember to turn it back to previous settings when you stop (to conserve your supply). ulmonary follow up is as needed   Pulmonary follow up is as needed

## 2022-08-07 NOTE — Progress Notes (Signed)
Paige Rhodes, female    DOB: 1932/06/26   MRN: [id number]   Brief patient profile:  32  yowf  quit smoking 1992  referred to pulmonary clinic in [place]  12/17/2021 by [facility]  for copd eval with onset around 2013 / 02 since then  and housebound mostly x 2017/18  [facility]fts by Luan Pulling in 07/01/11  "mod/severe airflow obst/ severe decrease DLCO" maint on spiriva dpi chronically then admit:    Admit date: 12/07/2021 Discharge date: 12/10/2021   Brief/Interim Summary: Paige Rhodes is a 88 y.o. female with medical history significant of COPD on 2.5L oxygen around the clock, NIDDM2, HLD, HTN, Hypothyroidism, osteoporosis, CKD2, GERD, and L infiltrating ductal carcinoma of the left breast followed by Dr Chryl Heck. Patient presents with worsening fatigue/weakness with increased oxygen needs and questionably increased urinary frequency.   Patient admitted as above with acute on chronic hypoxic respiratory failure, initially presumed to be pneumonia given atypical chest x-ray with right upper lobe opacification, however on repeat CT no pneumonias were noted, given patient's acute hypoxia and known history of COPD acute COPD exacerbation was most likely diagnosis.  Patient improved drastically on inhalers, steroids, continue steroid taper at discharge.      Discharge Diagnoses:  Active Problems:   Infiltrating ductal carcinoma of left breast   HLD (hyperlipidemia)   Chronic obstructive pulmonary disease (HCC)  History of Present Illness  12/17/2021  Pulmonary/ 1st office eval/ [facility] / [facility]  Chief Complaint  Patient presents with   Consult    Consult for COPD. Recent hospitalization 4/7-4/10 for SOB  Has been on oxygen for 10+ years/   Dyspnea: improved since d/c on Breo , did not restart spiriva  Cough: none at time of ov Sleep: 45 degrees in recliner x years  SABA use: rarely hfa  02  2.5 lpm 24/7 does not titrate at all Rec Plan A = Automatic = Always=    Breo one click first thing in  am and Spiriva right after Plan B = Backup (to supplement plan A, not to replace it) Only use your albuterol inhaler as a rescue medication Make sure you check your oxygen saturation  AT  your highest level of activity (not after you stop)   to be sure it stays over 90%       01/31/2022  f/u ov/[place] office/Trayce Caravello re: copd  maint on just spiriva   Chief Complaint  Patient presents with   Follow-up    Feels breathing has worsened since coming off of breo   Dyspnea:  out to hairdresser and doctor only / ok at house, gets let out at door  Cough: none  Sleeping: 45 degree recliner  SABA use: occ  02: 2.5 lpm at home and 3.5 POC  Covid status: x4  Rec Plan A = Automatic = Always=    Trelegy 607 one click each am > hospice changed to [place] B = Backup (to supplement plan A, not to replace it) Only use your albuterol inhaler as a rescue medication Make sure you check your oxygen saturation  AT  your highest level of activity (not after you stop)   to be sure it stays over 90%     08/07/2022  f/u ov/[place] office/Shanvi Moyd PX:TGGY each am  maint on BREO 100  / hospice pt Chief Complaint  Patient presents with   Follow-up    Breathing same since last ov    Dyspnea:  room to room / does not go  out x to hairdresser / doctor  Cough: none though strangles on liquids sometime  Sleeping: 30 degrees recliner  SABA use: rarely  02: 3.5 lpm 24/7      No obvious day to day or daytime variability or assoc excess/ purulent sputum or mucus plugs or hemoptysis or cp or chest tightness, subjective wheeze or overt sinus or hb symptoms.   Sleeping as above  without nocturnal  or early am exacerbation  of respiratory  c/o's or need for noct saba. Also denies any obvious fluctuation of symptoms with weather or environmental changes or other aggravating or alleviating factors except as outlined above   No unusual exposure hx or h/o childhood pna/ asthma or knowledge of premature birth.  Current  Allergies, Complete Past Medical History, Past Surgical History, Family History, and Social History were reviewed in [employer] record.  ROS  The following are not active complaints unless bolded Hoarseness, sore throat, dysphagia, dental problems, itching, sneezing,  nasal congestion or discharge of excess mucus or purulent secretions, ear ache,   fever, chills, sweats, unintended wt loss or wt gain, classically pleuritic or exertional cp,  orthopnea pnd or arm/hand swelling  or leg swelling, presyncope, palpitations, abdominal pain, anorexia, nausea, vomiting, diarrhea  or change in bowel habits or change in bladder habits, change in stools or change in urine, dysuria, hematuria,  rash, arthralgias, visual complaints, headache, numbness, weakness or ataxia or problems with walking/uses 2 wheeled walker or coordination,  change in mood or  memory.        Current Meds  Medication Sig   albuterol (VENTOLIN HFA) 108 (90 Base) MCG/ACT inhaler Inhale 2 puffs into the lungs every 6 (six) hours as needed for wheezing or shortness of breath.   amLODipine (NORVASC) 5 MG tablet Take 5 mg by mouth daily.    Ascorbic Acid (VITAMIN C PO) Take 1 tablet by mouth daily.   aspirin EC 81 MG tablet Take 81 mg by mouth daily.   atorvastatin (LIPITOR) 10 MG tablet Take 10 mg by mouth daily.   Calcium-Magnesium-Vitamin D (CALCIUM 500 PO) Take 1 tablet by mouth daily.   carvedilol (COREG) 25 MG tablet Take 25 mg by mouth 2 (two) times daily with a meal.     Cholecalciferol (VITAMIN D-3) 1000 units CAPS Take 1,000 Units by mouth daily.   diphenhydrAMINE HCl, Sleep, 50 MG CAPS Take 50 mg by mouth at bedtime.   fluticasone furoate-vilanterol (BREO ELLIPTA) 100-25 MCG/ACT AEPB Inhale 1 puff into the lungs daily.   levothyroxine (SYNTHROID, LEVOTHROID) 125 MCG tablet Take 125 mcg by mouth daily.     losartan (COZAAR) 50 MG tablet Take 50 mg by mouth daily.     Multiple Vitamin (MULTIVITAMIN) tablet  Take 1 tablet by mouth daily.                          Past Medical History:  Diagnosis Date   Breast cancer ([place])    Chronic renal disease, stage 2, mildly decreased glomerular filtration rate between 60-89 mL/min/1.73 square meter 10/31/2014   COPD (chronic obstructive pulmonary disease) ([place])    Diabetes mellitus    GERD (gastroesophageal reflux disease)    occasional   Glaucoma    Gout attack june 2013   left foot   Hard of hearing    Hyperlipidemia    Hypertension    Hypothyroidism    Infiltrating ductal carcinoma of left breast 03/05/2011   On  home oxygen therapy    2 LPM   Osteoporosis    Port catheter in place 01/15/2012   SOB (shortness of breath)    Thyroid disease        Objective:     Wts   08/07/2022       161   01/31/22 163 lb 9.6 oz (74.2 kg)  12/17/21 164 lb 3.2 oz (74.5 kg)  12/07/21 175 lb (79.4 kg)    Vital signs reviewed  08/07/2022  - Note at rest 02 sats  90% on 3.5    General appearance:    elderly wf / 2 wheeled walker    HEENT :  Oropharynx  clear      NECK :  without JVD/Nodes/TM/ nl carotid upstrokes bilaterally   LUNGS: no acc muscle use,  Mod barrel  contour chest wall with bilateral  Distant bs s audible wheeze and  without cough on insp or exp maneuvers and mod  Hyperresonant  to  percussion bilaterally     CV:  RRR  no s3 or murmur or increase in P2, and no edema   ABD:  soft and nontender    MS:   Ext warm without deformities or   obvious joint restrictions , calf tenderness, cyanosis or clubbing  SKIN: warm and dry without lesions    NEURO:  alert, approp, nl sensorium with  no motor or cerebellar deficits apparent.               Assessment

## 2022-08-09 ENCOUNTER — Encounter (HOSPITAL_COMMUNITY): Payer: Self-pay | Admitting: Hematology

## 2022-08-13 ENCOUNTER — Encounter (HOSPITAL_COMMUNITY): Payer: Self-pay | Admitting: Hematology

## 2022-08-15 DIAGNOSIS — N3000 Acute cystitis without hematuria: Secondary | ICD-10-CM | POA: Diagnosis not present

## 2022-08-20 ENCOUNTER — Inpatient Hospital Stay: Payer: Medicare Other | Attending: Hematology

## 2022-08-20 ENCOUNTER — Inpatient Hospital Stay (HOSPITAL_BASED_OUTPATIENT_CLINIC_OR_DEPARTMENT_OTHER): Payer: Medicare Other | Admitting: Physician Assistant

## 2022-08-20 VITALS — BP 161/72 | HR 57 | Temp 97.9°F | Resp 18 | Wt 160.5 lb

## 2022-08-20 DIAGNOSIS — D72829 Elevated white blood cell count, unspecified: Secondary | ICD-10-CM | POA: Diagnosis not present

## 2022-08-20 DIAGNOSIS — D500 Iron deficiency anemia secondary to blood loss (chronic): Secondary | ICD-10-CM

## 2022-08-20 DIAGNOSIS — M810 Age-related osteoporosis without current pathological fracture: Secondary | ICD-10-CM | POA: Insufficient documentation

## 2022-08-20 DIAGNOSIS — D649 Anemia, unspecified: Secondary | ICD-10-CM | POA: Diagnosis not present

## 2022-08-20 DIAGNOSIS — Z853 Personal history of malignant neoplasm of breast: Secondary | ICD-10-CM | POA: Diagnosis not present

## 2022-08-20 DIAGNOSIS — C50912 Malignant neoplasm of unspecified site of left female breast: Secondary | ICD-10-CM | POA: Diagnosis not present

## 2022-08-20 DIAGNOSIS — Z87891 Personal history of nicotine dependence: Secondary | ICD-10-CM | POA: Diagnosis not present

## 2022-08-20 DIAGNOSIS — Z79899 Other long term (current) drug therapy: Secondary | ICD-10-CM | POA: Insufficient documentation

## 2022-08-20 LAB — CBC WITH DIFFERENTIAL/PLATELET
Abs Immature Granulocytes: 0.01 10*3/uL (ref 0.00–0.07)
Basophils Absolute: 0 10*3/uL (ref 0.0–0.1)
Basophils Relative: 0 %
Eosinophils Absolute: 0.3 10*3/uL (ref 0.0–0.5)
Eosinophils Relative: 4 %
HCT: 35.7 % — ABNORMAL LOW (ref 36.0–46.0)
Hemoglobin: 11.2 g/dL — ABNORMAL LOW (ref 12.0–15.0)
Immature Granulocytes: 0 %
Lymphocytes Relative: 23 %
Lymphs Abs: 1.9 10*3/uL (ref 0.7–4.0)
MCH: 29.8 pg (ref 26.0–34.0)
MCHC: 31.4 g/dL (ref 30.0–36.0)
MCV: 94.9 fL (ref 80.0–100.0)
Monocytes Absolute: 0.7 10*3/uL (ref 0.1–1.0)
Monocytes Relative: 8 %
Neutro Abs: 5.3 10*3/uL (ref 1.7–7.7)
Neutrophils Relative %: 65 %
Platelets: 210 10*3/uL (ref 150–400)
RBC: 3.76 MIL/uL — ABNORMAL LOW (ref 3.87–5.11)
RDW: 14.4 % (ref 11.5–15.5)
WBC: 8.2 10*3/uL (ref 4.0–10.5)
nRBC: 0 % (ref 0.0–0.2)

## 2022-08-20 LAB — IRON AND TIBC
Iron: 85 ug/dL (ref 28–170)
Saturation Ratios: 32 % — ABNORMAL HIGH (ref 10.4–31.8)
TIBC: 265 ug/dL (ref 250–450)
UIBC: 180 ug/dL

## 2022-08-20 LAB — FERRITIN: Ferritin: 440 ng/mL — ABNORMAL HIGH (ref 11–307)

## 2022-08-20 NOTE — Progress Notes (Signed)
[place] [place], [place] [address]   CLINIC:  Medical Oncology/Hematology  PCP:  [name], MD [address] [place] [address] [phone]   REASON FOR VISIT:  Follow-up for iron deficiency anemia  CURRENT THERAPY: IV iron  INTERVAL HISTORY:  Paige Rhodes 88 y.o. female returns for routine follow-up of her iron deficiency anemia.  She also follows at our clinic for history of left-sided breast cancer (comprehensive evaluation in August 2023).  At today's visit, she is primarily here to focus on her anemia.  She received IV iron (Feraheme) on 07/02/2022 and 07/12/2022, and reports feeling "stronger and not as sleepy."  She has ongoing bleeding hemorrhoids "every time she sits on the toilet," and reports that she "has to wear a pad in her underwear so that she does not bleed into her clothes."  PCP recently prescribed new medication for hemorrhoids.  We discussed referral for hemorrhoid banding as another possible treatment option but she would like to wait and see if they get better on this new medication. She does not take any iron supplement at home since it causes severe constipation. She denies any ice pica.   She has chronic dyspnea on exertion from her COPD.  She reports 40% energy and 100% appetite.  She is maintaining a stable weight at this time.    REVIEW OF SYSTEMS: Review of Systems  Constitutional:  Positive for fatigue (improved). Negative for appetite change, chills, diaphoresis, fever and unexpected weight change.  HENT:   Negative for lump/mass and nosebleeds.   Eyes:  Negative for eye problems.  Respiratory:  Positive for shortness of breath (Chronic from COPD, on 3.5 L supplemental oxygen continuous). Negative for cough and hemoptysis.   Cardiovascular:  Negative for chest pain, leg swelling and palpitations.  Gastrointestinal:  Positive for constipation. Negative for abdominal pain, blood in stool, diarrhea, nausea and  vomiting.  Genitourinary:  Negative for hematuria.        Frequent UTIs, referred to urology by PCP  Skin: Negative.   Neurological:  Negative for dizziness, headaches and light-headedness.  Hematological:  Does not bruise/bleed easily.      PAST MEDICAL/SURGICAL HISTORY:  Past Medical History:  Diagnosis Date   Breast cancer ([place])    Chronic renal disease, stage 2, mildly decreased glomerular filtration rate between 60-89 mL/min/1.73 square meter 10/31/2014   COPD (chronic obstructive pulmonary disease) ([place])    Diabetes mellitus    GERD (gastroesophageal reflux disease)    occasional   Glaucoma    Gout attack june 2013   left foot   Hard of hearing    Hyperlipidemia    Hypertension    Hypothyroidism    Infiltrating ductal carcinoma of left breast 03/05/2011   On home oxygen therapy    2 LPM   Osteoporosis    Port catheter in place 01/15/2012   SOB (shortness of breath)    Thyroid disease    Past Surgical History:  Procedure Laterality Date   [place] LUMPECTOMY  2011   left   CATARACT EXTRACTION W/PHACO  09/03/2012   Procedure: CATARACT EXTRACTION PHACO AND INTRAOCULAR LENS PLACEMENT ([place]);  Surgeon: Tonny Branch, MD;  Location: [facility];  Service: Ophthalmology;  Laterality: Right;  CDE: 15.26   CATARACT EXTRACTION W/PHACO  09/28/2012   Procedure: CATARACT EXTRACTION PHACO AND INTRAOCULAR LENS PLACEMENT ([facility]);  Surgeon: Tonny Branch, MD;  Location: [facility];  Service: Ophthalmology;  Laterality: Left;  CDE:  21.60   CHOLECYSTECTOMY  1990   OPEN REDUCTION INTERNAL FIXATION (ORIF) DISTAL RADIAL FRACTURE Left 07/01/2019   Procedure: OPEN REDUCTION INTERNAL FIXATION (ORIF) LEFT DISTAL RADIAL FRACTURE;  Surgeon: Carole Civil, MD;  Location: [facility];  Service: Orthopedics;  Laterality: Left;   PORT-A-CATH REMOVAL N/A 04/26/2015   Procedure: REMOVAL PORT-A-CATH;  Surgeon: Aviva Signs, MD;  Location: [facility];  Service: General;  Laterality:  N/A;   PORTACATH PLACEMENT  05/23/10   THYROID SURGERY  1994   TONSILLECTOMY     TOTAL HIP ARTHROPLASTY  1996     SOCIAL HISTORY:  Social History   Socioeconomic History   Marital status: Married    Spouse name: Not on file   Number of children: Not on file   Years of education: Not on file   Highest education level: Not on file  Occupational History   Not on file  Tobacco Use   Smoking status: Former    Packs/day: 3.00    Years: 30.00    Total pack years: 90.00    Types: Cigarettes    Quit date: 09/02/1991    Years since quitting: 30.9   Smokeless tobacco: Never  Substance and Sexual Activity   Alcohol use: No   Drug use: No   Sexual activity: Yes    Birth control/protection: Post-menopausal  Other Topics Concern   Not on file  Social History Narrative   Not on file   Social Determinants of Health   Financial Resource Strain: Not on file  Food Insecurity: Not on file  Transportation Needs: Not on file  Physical Activity: Not on file  Stress: Not on file  Social Connections: Not on file  Intimate Partner Violence: Not on file    FAMILY HISTORY:  Family History  Problem Relation Age of Onset   Kidney disease Mother    Heart disease Father    Hypertension Brother    Cancer Sister        breast and lung    CURRENT MEDICATIONS:  Outpatient Encounter Medications as of 08/20/2022  Medication Sig   albuterol (VENTOLIN HFA) 108 (90 Base) MCG/ACT inhaler Inhale 2 puffs into the lungs every 6 (six) hours as needed for wheezing or shortness of breath.   amLODipine (NORVASC) 5 MG tablet Take 5 mg by mouth daily.    Ascorbic Acid (VITAMIN C PO) Take 1 tablet by mouth daily.   aspirin EC 81 MG tablet Take 81 mg by mouth daily.   atorvastatin (LIPITOR) 10 MG tablet Take 10 mg by mouth daily.   Calcium-Magnesium-Vitamin D (CALCIUM 500 PO) Take 1 tablet by mouth daily.   carvedilol (COREG) 25 MG tablet Take 25 mg by mouth 2 (two) times daily with a meal.      Cholecalciferol (VITAMIN D-3) 1000 units CAPS Take 1,000 Units by mouth daily.   diphenhydrAMINE HCl, Sleep, 50 MG CAPS Take 50 mg by mouth at bedtime.   fluticasone furoate-vilanterol (BREO ELLIPTA) 100-25 MCG/ACT AEPB Inhale 1 puff into the lungs daily.   levothyroxine (SYNTHROID, LEVOTHROID) 125 MCG tablet Take 125 mcg by mouth daily.     losartan (COZAAR) 50 MG tablet Take 50 mg by mouth daily.     Multiple Vitamin (MULTIVITAMIN) tablet Take 1 tablet by mouth daily.   Facility-Administered Encounter Medications as of 08/20/2022  Medication   sodium chloride 0.9 % injection 10 mL    ALLERGIES:  Allergies  Allergen Reactions   Penicillins  Anaphylaxis    Edema of throat   Hctz [Hydrochlorothiazide] Other (See Comments)    gout   Morphine And Related Itching   Lotrel [Amlodipine Besy-Benazepril Hcl] Other (See Comments)    Facial muscles draw and break out in whilps     PHYSICAL EXAM:  ECOG PERFORMANCE STATUS: 2 - Symptomatic, <50% confined to bed  There were no vitals filed for this visit. There were no vitals filed for this visit. Physical Exam Constitutional:      Appearance: Normal appearance. She is obese.     Comments: Presents in wheelchair.  Nasal cannula with 3.5 L supplemental oxygen in place.  HENT:     Head: Normocephalic and atraumatic.     Mouth/Throat:     Mouth: Mucous membranes are moist.  Eyes:     Extraocular Movements: Extraocular movements intact.     Pupils: Pupils are equal, round, and reactive to light.  Cardiovascular:     Rate and Rhythm: Normal rate and regular rhythm.     Pulses: Normal pulses.     Heart sounds: Normal heart sounds.  Pulmonary:     Effort: Pulmonary effort is normal.     Breath sounds: Normal breath sounds.     Comments: Decreased breath sounds and poor air movement in all lung fields. Chest:     Comments: Breast exam deferred, to perform annually or as needed.  See note from August 2023 for most recent breast  exam. Abdominal:     General: Bowel sounds are normal.     Palpations: Abdomen is soft.     Tenderness: There is no abdominal tenderness.  Musculoskeletal:        General: No swelling.     Right lower leg: No edema.     Left lower leg: No edema.  Lymphadenopathy:     Cervical: No cervical adenopathy.  Skin:    General: Skin is warm and dry.  Neurological:     General: No focal deficit present.     Mental Status: She is alert and oriented to person, place, and time.  Psychiatric:        Mood and Affect: Mood normal.        Behavior: Behavior normal.      LABORATORY DATA:  I have reviewed the labs as listed.  CBC    Component Value Date/Time   WBC 10.9 (H) 04/16/2022 1301   RBC 3.53 (L) 04/16/2022 1446   RBC 3.20 (L) 04/16/2022 1301   HGB 9.5 (L) 04/16/2022 1301   HCT 29.2 (L) 04/16/2022 1301   PLT 191 04/16/2022 1301   MCV 91.3 04/16/2022 1301   MCH 29.7 04/16/2022 1301   MCHC 32.5 04/16/2022 1301   RDW 13.9 04/16/2022 1301   LYMPHSABS 2.2 04/16/2022 1301   MONOABS 0.9 04/16/2022 1301   EOSABS 0.2 04/16/2022 1301   BASOSABS 0.0 04/16/2022 1301      Latest Ref Rng & Units 04/16/2022    1:01 PM 12/10/2021    5:30 AM 12/09/2021    4:16 AM  CMP  Glucose 70 - 99 mg/dL 121  208  212   BUN 8 - 23 mg/dL 25  89  99   Creatinine 0.44 - 1.00 mg/dL 0.93  2.21  2.43   Sodium 135 - 145 mmol/L 138  139  140   Potassium 3.5 - 5.1 mmol/L 3.9  3.5  3.8   Chloride 98 - 111 mmol/L 103  103  103   CO2 22 - 32  mmol/L '28  28  28   '$ Calcium 8.9 - 10.3 mg/dL 8.9  9.4  9.8   Total Protein 6.5 - 8.1 g/dL 7.5     Total Bilirubin 0.3 - 1.2 mg/dL 0.2     Alkaline Phos 38 - 126 U/L 51     AST 15 - 41 U/L 15     ALT 0 - 44 U/L 14       DIAGNOSTIC IMAGING:  I have independently reviewed the relevant imaging and discussed with the patient.  ASSESSMENT & PLAN: 1.  Normocytic anemia - CBC from 04/16/2022 showed progressive normocytic anemia with Hgb 9.5/MCV 91.3 - Hematology work-up  (04/16/2022): Ferritin 70, serum iron 23, iron saturation 8% Normal folate, copper, B12, MMA SPEP negative for M spike.  Immunofixation shows polyclonal immunoglobulins.  Elevated kappa free light chain 70.7, elevated lambda 31.6, elevated ratio 2.24. - She reports bleeding hemorrhoids "every time she sits on the toilet," and reports that she "has to wear a pad in her underwear so that she does not bleed into her clothes." - Unable to tolerate iron supplement due to severe constipation that worsens her hemorrhoid bleeding. - Received IV Feraheme x 2 from 07/02/2022 through 07/12/2022 - Energy is improved after IV iron - Labs today (08/20/2022): Hemoglobin improved at 11.2.  Ferritin 440, iron saturation 32%. - Differential diagnosis favors anemia secondary to functional iron deficiency in the setting of chronic disease as well as iron deficiency from rectal bleeding - PLAN: No indication for IV iron at this time - Repeat CBC and iron panel with RTC in 3 months. - Repeat free light chains/MGUS panel in 1 year (August 2024) - Patient would like to hold off on GI referral for the time being, but we will discuss this again at her follow-up visit, particularly the option of possible hemorrhoid banding.   2.  OTHER ISSUES: Not addressed during today's visit, but discussed in detail per progress note from 04/16/2022. History of stage II left breast cancer: Annual mammogram (next due January 2024).  Annual breast exam (next due August 2024). Osteoporosis: CMP and Prolia every 6 months (next due February 2024).  Bone density scan due June 2024.   Leukocytosis: Continue monitoring, likely secondary to [facility] Ellipta inhaler.   PLAN SUMMARY: >> Mammogram January 2024 >> CMP + Prolia every 6 months (next due February 2024) >> Labs in 3 months (CBC, ferritin, iron/TIBC) >> PHONE visit 1 week after labs >> Bone density/DEXA scan June 2024   All questions were answered. The patient knows to call the clinic  with any problems, questions or concerns.  Medical decision making: Low  Time spent on visit: I spent 15 minutes counseling the patient face to face. The total time spent in the appointment was 22 minutes and more than 50% was on counseling.   Harriett Rush, PA-C  08/20/2022 1:36 PM

## 2022-08-20 NOTE — Patient Instructions (Signed)
[facility] **   You were seen today by Tarri Abernethy PA-C for your follow-up visit.    IRON DEFICIENCY ANEMIA Your blood and iron levels look much better after your IV iron! You do not need any more IV iron at this time. We will check labs and discuss results via phone visit in 3 months. If you continue to have moderate to severe hemorrhoid bleeding, please let me know if you would like me to place referral for you to see NP Roseanne Kaufman at [facility] for hemorrhoid banding procedure.  HISTORY OF BREAST CANCER & OSTEOPOROSIS You are due for mammogram in January 2024 You are due for Prolia shot in February 2024 Your bone density scan is due in June 2024  FOLLOW-UP APPOINTMENT: Phone visit in 3 months, after labs  ** Thank you for trusting me with your healthcare!  I strive to provide all of my patients with quality care at each visit.  If you receive a survey for this visit, I would be so grateful to you for taking the time to provide feedback.  Thank you in advance!  ~ Sharone Picchi                   Dr. Derek Jack   &   Tarri Abernethy, PA-C   - - - - - - - - - - - - - - - - - -    Thank you for choosing [facility] to provide your oncology and hematology care.  To afford each patient quality time with our provider, please arrive at least 15 minutes before your scheduled appointment time.   If you have a lab appointment with the [facility] please come in thru the Main Entrance and check in at the main information desk.  You need to re-schedule your appointment should you arrive 10 or more minutes late.  We strive to give you quality time with our providers, and arriving late affects you and other patients whose appointments are after yours.  Also, if you no show three or more times for appointments you may be dismissed from the  clinic at the providers discretion.     Again, thank you for choosing [facility].  Our hope is that these requests will decrease the amount of time that you wait before being seen by our physicians.       _____________________________________________________________  Should you have questions after your visit to [facility], please contact our office at [phone] and follow the prompts.  Our office hours are 8:00 a.m. and 4:30 p.m. Monday - Friday.  Please note that voicemails left after 4:00 p.m. may not be returned until the following business day.  We are closed weekends and major holidays.  You do have access to a nurse 24-7, just call the main number to the clinic [phone] and do not press any options, hold on the line and a nurse will answer the phone.    For prescription refill requests, have your pharmacy contact our office and allow 72 hours.

## 2022-08-21 ENCOUNTER — Other Ambulatory Visit: Payer: Self-pay

## 2022-08-21 DIAGNOSIS — D649 Anemia, unspecified: Secondary | ICD-10-CM

## 2022-08-21 DIAGNOSIS — C50912 Malignant neoplasm of unspecified site of left female breast: Secondary | ICD-10-CM

## 2022-08-21 DIAGNOSIS — D500 Iron deficiency anemia secondary to blood loss (chronic): Secondary | ICD-10-CM

## 2022-09-13 DIAGNOSIS — N3000 Acute cystitis without hematuria: Secondary | ICD-10-CM | POA: Diagnosis not present

## 2022-10-04 DIAGNOSIS — N3000 Acute cystitis without hematuria: Secondary | ICD-10-CM | POA: Diagnosis not present

## 2022-10-15 ENCOUNTER — Encounter (HOSPITAL_COMMUNITY): Payer: Self-pay | Admitting: Hematology

## 2022-10-22 ENCOUNTER — Telehealth: Payer: Self-pay

## 2022-10-22 ENCOUNTER — Encounter: Payer: Self-pay | Admitting: Urology

## 2022-10-22 ENCOUNTER — Ambulatory Visit: Payer: Medicare Other | Admitting: Urology

## 2022-10-22 VITALS — BP 165/73 | HR 71

## 2022-10-22 DIAGNOSIS — N3000 Acute cystitis without hematuria: Secondary | ICD-10-CM | POA: Diagnosis not present

## 2022-10-22 DIAGNOSIS — R350 Frequency of micturition: Secondary | ICD-10-CM

## 2022-10-22 DIAGNOSIS — Z87440 Personal history of urinary (tract) infections: Secondary | ICD-10-CM

## 2022-10-22 DIAGNOSIS — N952 Postmenopausal atrophic vaginitis: Secondary | ICD-10-CM | POA: Diagnosis not present

## 2022-10-22 DIAGNOSIS — R3915 Urgency of urination: Secondary | ICD-10-CM

## 2022-10-22 DIAGNOSIS — R300 Dysuria: Secondary | ICD-10-CM

## 2022-10-22 DIAGNOSIS — N390 Urinary tract infection, site not specified: Secondary | ICD-10-CM | POA: Diagnosis not present

## 2022-10-22 LAB — URINALYSIS, ROUTINE W REFLEX MICROSCOPIC
Bilirubin, UA: NEGATIVE
Glucose, UA: NEGATIVE
Ketones, UA: NEGATIVE
Nitrite, UA: NEGATIVE
Specific Gravity, UA: 1.02 (ref 1.005–1.030)
Urobilinogen, Ur: 0.2 mg/dL (ref 0.2–1.0)
pH, UA: 6.5 (ref 5.0–7.5)

## 2022-10-22 LAB — MICROSCOPIC EXAMINATION: WBC, UA: >30 /hpf — AB (ref 0–5)

## 2022-10-22 LAB — BLADDER SCAN AMB NON-IMAGING: Scan Result: 66

## 2022-10-22 MED ORDER — ESTRADIOL 0.1 MG/GM VA CREA: TOPICAL_CREAM | VAGINAL | 3 refills | Status: DC

## 2022-10-22 NOTE — Progress Notes (Signed)
H&P  Chief Complaint: Frequent urinary tract infections  History of Present Illness: Paige Rhodes is a 89 y.o. year old female with COPD, history of chronic renal insufficiency, 15 years out from breast cancer treatment who presents with frequent urinary tract infections over the past year.  She was hospitalized around Easter of last year with UTI and altered mental status.  Since that time she thinks she has been treated once a month for urinary tract infection.  No febrile UTIs, no blood with these UTIs but she does have frequency urgency and dysuria.  She has no significant leakage.  No prior urologic evaluation.  She has a good stream and usually feels like she empties well.  Past Medical History:  Diagnosis Date   Breast cancer ([place])    Chronic renal disease, stage 2, mildly decreased glomerular filtration rate between 60-89 mL/min/1.73 square meter 10/31/2014   COPD (chronic obstructive pulmonary disease) ([place])    Diabetes mellitus    GERD (gastroesophageal reflux disease)    occasional   Glaucoma    Gout attack june 2013   left foot   Hard of hearing    Hyperlipidemia    Hypertension    Hypothyroidism    Infiltrating ductal carcinoma of left breast 03/05/2011   On home oxygen therapy    2 LPM   Osteoporosis    Port catheter in place 01/15/2012   SOB (shortness of breath)    Thyroid disease     Past Surgical History:  Procedure Laterality Date   [place] LUMPECTOMY  2011   left   CATARACT EXTRACTION W/PHACO  09/03/2012   Procedure: CATARACT EXTRACTION PHACO AND INTRAOCULAR LENS PLACEMENT ([place]);  Surgeon: Tonny Branch, MD;  Location: [facility];  Service: Ophthalmology;  Laterality: Right;  CDE: 15.26   CATARACT EXTRACTION W/PHACO  09/28/2012   Procedure: CATARACT EXTRACTION PHACO AND INTRAOCULAR LENS PLACEMENT ([facility]);  Surgeon: Tonny Branch, MD;  Location: [facility];  Service: Ophthalmology;  Laterality: Left;  CDE:  21.60   CHOLECYSTECTOMY  1990    OPEN REDUCTION INTERNAL FIXATION (ORIF) DISTAL RADIAL FRACTURE Left 07/01/2019   Procedure: OPEN REDUCTION INTERNAL FIXATION (ORIF) LEFT DISTAL RADIAL FRACTURE;  Surgeon: Carole Civil, MD;  Location: [facility];  Service: Orthopedics;  Laterality: Left;   PORT-A-CATH REMOVAL N/A 04/26/2015   Procedure: REMOVAL PORT-A-CATH;  Surgeon: Aviva Signs, MD;  Location: [facility];  Service: General;  Laterality: N/A;   PORTACATH PLACEMENT  05/23/10   THYROID SURGERY  1994   TONSILLECTOMY     TOTAL HIP ARTHROPLASTY  1996    Home Medications:  (Not in a hospital admission)   Allergies:  Allergies  Allergen Reactions   Penicillins Anaphylaxis    Edema of throat   Hctz [Hydrochlorothiazide] Other (See Comments)    gout   Morphine And Related Itching   Lotrel [Amlodipine Besy-Benazepril Hcl] Other (See Comments)    Facial muscles draw and break out in whilps    Family History  Problem Relation Age of Onset   Kidney disease Mother    Heart disease Father    Hypertension Brother    Cancer Sister        breast and lung    Social History:  reports that she quit smoking about 31 years ago. Her smoking use included cigarettes. She has a 90.00 pack-year smoking history. She has never used smokeless tobacco. She reports that she does not drink alcohol and  does not use drugs.  ROS: A complete review of systems was performed.  All systems are negative except for pertinent findings as noted.  Physical Exam:  Vital signs in last 24 hours: @VSRANGES$ @ General:  Alert and oriented, No acute distress HEENT: Normocephalic, atraumatic Neck: No JVD or lymphadenopathy Cardiovascular: Regular rate  Lungs: Normal inspiratory/expiratory excursion Neurologic: Grossly intact   I have reviewed notes from referring/previous physicians---2 pages of records from Dr. [name] reviewed  I have reviewed urinalysis results  I have independently reviewed prior imaging--residual urine volume today  negligible  I have reviewed prior urine culture--ESBL E. coli recently  Impression/Assessment:  Elderly female with vaginal atrophic changes and recurrent urinary tract infections.  Empties well.  No worries of underlying structural abnormalities  Plan:  1.  Will start her on estrogen cream, probiotics, take AZO cranberry supplement twice a day  2.  Reassurance regarding that this is a common process and elderly females  3.  I will have her come back in a couple of months to recheck  4.  I did send urine culture today  Lillette Boxer Brandi Armato 10/22/2022, 8:17 AM  Lillette Boxer. Kayren Holck MD

## 2022-10-22 NOTE — Progress Notes (Signed)
Pt here today for bladder scan. Bladder was scanned and 66 was visualized.    Performed by Marisue Brooklyn, CMA

## 2022-10-22 NOTE — Telephone Encounter (Signed)
Pharmacy called and they need clarification on the sig of the estrace cream rx'd.  They need to know if you want a half a gram or 1 gram applied 2 nights a week, please advise and I can update rx.

## 2022-10-23 ENCOUNTER — Other Ambulatory Visit: Payer: Self-pay

## 2022-10-23 DIAGNOSIS — C50912 Malignant neoplasm of unspecified site of left female breast: Secondary | ICD-10-CM

## 2022-10-24 ENCOUNTER — Encounter (HOSPITAL_COMMUNITY): Payer: Self-pay

## 2022-10-24 ENCOUNTER — Ambulatory Visit (HOSPITAL_COMMUNITY)
Admission: RE | Admit: 2022-10-24 | Discharge: 2022-10-24 | Disposition: A | Discharge status: Home / Self Care | Payer: Medicare Other | Source: Ambulatory Visit | Attending: Physician Assistant | Admitting: Physician Assistant

## 2022-10-24 ENCOUNTER — Inpatient Hospital Stay: Payer: Medicare Other

## 2022-10-24 ENCOUNTER — Inpatient Hospital Stay: Payer: Medicare Other | Attending: Hematology

## 2022-10-24 VITALS — BP 150/67 | HR 59 | Temp 97.8°F | Resp 18

## 2022-10-24 DIAGNOSIS — D649 Anemia, unspecified: Secondary | ICD-10-CM | POA: Diagnosis not present

## 2022-10-24 DIAGNOSIS — Z853 Personal history of malignant neoplasm of breast: Secondary | ICD-10-CM | POA: Insufficient documentation

## 2022-10-24 DIAGNOSIS — C50912 Malignant neoplasm of unspecified site of left female breast: Secondary | ICD-10-CM

## 2022-10-24 DIAGNOSIS — Z1231 Encounter for screening mammogram for malignant neoplasm of breast: Secondary | ICD-10-CM | POA: Diagnosis not present

## 2022-10-24 DIAGNOSIS — M810 Age-related osteoporosis without current pathological fracture: Secondary | ICD-10-CM

## 2022-10-24 LAB — COMPREHENSIVE METABOLIC PANEL
ALT: 16 U/L (ref 0–44)
AST: 19 U/L (ref 15–41)
Albumin: 3.6 g/dL (ref 3.5–5.0)
Alkaline Phosphatase: 57 U/L (ref 38–126)
Anion gap: 7 (ref 5–15)
BUN: 33 mg/dL — ABNORMAL HIGH (ref 8–23)
CO2: 32 mmol/L (ref 22–32)
Calcium: 9.8 mg/dL (ref 8.9–10.3)
Chloride: 99 mmol/L (ref 98–111)
Creatinine, Ser: 0.87 mg/dL (ref 0.44–1.00)
GFR, Estimated: >60 mL/min (ref >60)
Glucose, Bld: 121 mg/dL — ABNORMAL HIGH (ref 70–99)
Potassium: 4.1 mmol/L (ref 3.5–5.1)
Sodium: 138 mmol/L (ref 135–145)
Total Bilirubin: 0.4 mg/dL (ref 0.3–1.2)
Total Protein: 7.5 g/dL (ref 6.5–8.1)

## 2022-10-24 MED ORDER — DENOSUMAB 60 MG/ML ~~LOC~~ SOSY
60.0000 mg | PREFILLED_SYRINGE | Freq: Once | SUBCUTANEOUS | Status: AC
  Administered 2022-10-24: 60 mg via SUBCUTANEOUS
  Filled 2022-10-24: qty 1

## 2022-10-24 NOTE — Telephone Encounter (Signed)
-----   Message from Franchot Gallo, MD sent at 10/23/2022  7:47 PM EST ----- Regarding: RE: Medication Gee--seems like 1/2 " would be specific  enough--call them and say 1 gram. ----- Message ----- From: Sherrilyn Rist, CMA Sent: 10/23/2022   3:08 PM EST To: Franchot Gallo, MD Subject: Medication                                     Pharmacy staff needs clarification on Estrace. Is the Estrace in grams?

## 2022-10-24 NOTE — Progress Notes (Signed)
Paige Rhodes presents today for injection per the provider's orders.  Prolia 60 mg administration without incident; injection site WNL; see MAR for injection details.  Patient tolerated procedure well and without incident.  No questions or complaints noted at this time. Pt's Calcium noted to be 9.8 today.   Pt denies any tooth or jaw pain, and no recent or future dental appointments at this time. Pt reports taking Calcium and Vit D supplements.  Discharged from clinic via wheelchair in stable condition. Alert and oriented x 3. F/U with [facility] as scheduled.

## 2022-10-24 NOTE — Patient Instructions (Signed)
[facility]  Discharge Instructions: Thank you for choosing [facility] to provide your oncology and hematology care.  If you have a lab appointment with the [facility], please come in thru the Main Entrance and check in at the main information desk.  Wear comfortable clothing and clothing appropriate for easy access to any Portacath or PICC line.   We strive to give you quality time with your provider. You may need to reschedule your appointment if you arrive late (15 or more minutes).  Arriving late affects you and other patients whose appointments are after yours.  Also, if you miss three or more appointments without notifying the office, you may be dismissed from the clinic at the provider's discretion.      For prescription refill requests, have your pharmacy contact our office and allow 72 hours for refills to be completed.    Today you received Prolia   BELOW ARE SYMPTOMS THAT SHOULD BE REPORTED IMMEDIATELY: *FEVER GREATER THAN 100.4 F (38 C) OR HIGHER *CHILLS OR SWEATING *NAUSEA AND VOMITING THAT IS NOT CONTROLLED WITH YOUR NAUSEA MEDICATION *UNUSUAL SHORTNESS OF BREATH *UNUSUAL BRUISING OR BLEEDING *URINARY PROBLEMS (pain or burning when urinating, or frequent urination) *BOWEL PROBLEMS (unusual diarrhea, constipation, pain near the anus) TENDERNESS IN MOUTH AND THROAT WITH OR WITHOUT PRESENCE OF ULCERS (sore throat, sores in mouth, or a toothache) UNUSUAL RASH, SWELLING OR PAIN  UNUSUAL VAGINAL DISCHARGE OR ITCHING   Items with * indicate a potential emergency and should be followed up as soon as possible or go to the Emergency Department if any problems should occur.  Please show the CHEMOTHERAPY ALERT CARD or IMMUNOTHERAPY ALERT CARD at check-in to the Emergency Department and triage nurse.  Should you have questions after your visit or need to cancel or reschedule your appointment, please contact [facility]  [phone]  and follow the prompts.  Office hours are 8:00 a.m. to 4:30 p.m. Monday - Friday. Please note that voicemails left after 4:00 p.m. may not be returned until the following business day.  We are closed weekends and major holidays. You have access to a nurse at all times for urgent questions. Please call the main number to the clinic [phone] and follow the prompts.  For any non-urgent questions, you may also contact your provider using MyChart. We now offer e-Visits for anyone 29 and older to request care online for non-urgent symptoms. For details visit [email or web address].   Also download the MyChart app! Go to the app store, search "MyChart", open the app, select [facility], and log in with your MyChart username and password.

## 2022-10-24 NOTE — Telephone Encounter (Signed)
Pharmacists Deidre Ala is aware  to filled Estrace rx as 1 gram per Dr. Diona Fanti and pharmacists voiced understanding

## 2022-10-29 ENCOUNTER — Encounter (HOSPITAL_COMMUNITY): Payer: Self-pay | Admitting: Hematology

## 2022-11-07 ENCOUNTER — Encounter (HOSPITAL_COMMUNITY): Payer: Self-pay | Admitting: Hematology

## 2022-11-14 ENCOUNTER — Inpatient Hospital Stay

## 2022-11-14 ENCOUNTER — Other Ambulatory Visit: Payer: Self-pay

## 2022-11-14 ENCOUNTER — Encounter (HOSPITAL_COMMUNITY): Payer: Self-pay | Admitting: Hematology

## 2022-11-14 ENCOUNTER — Observation Stay (HOSPITAL_COMMUNITY): Payer: Medicare Other | Admitting: Anesthesiology

## 2022-11-14 ENCOUNTER — Other Ambulatory Visit: Payer: Self-pay | Admitting: *Deleted

## 2022-11-14 ENCOUNTER — Emergency Department (HOSPITAL_COMMUNITY): Payer: Medicare Other

## 2022-11-14 ENCOUNTER — Observation Stay (HOSPITAL_COMMUNITY)
Admission: EM | Admit: 2022-11-14 | Discharge: 2022-11-15 | Disposition: A | Discharge status: Home / Self Care | Payer: Medicare Other | Attending: Family Medicine | Admitting: Family Medicine

## 2022-11-14 ENCOUNTER — Observation Stay (HOSPITAL_BASED_OUTPATIENT_CLINIC_OR_DEPARTMENT_OTHER): Payer: Medicare Other | Admitting: Anesthesiology

## 2022-11-14 ENCOUNTER — Encounter (HOSPITAL_COMMUNITY)
Admission: EM | Disposition: A | Discharge status: Home / Self Care | Payer: Self-pay | Source: Home / Self Care | Attending: Family Medicine

## 2022-11-14 ENCOUNTER — Encounter (HOSPITAL_COMMUNITY): Payer: Self-pay

## 2022-11-14 DIAGNOSIS — K4030 Unilateral inguinal hernia, with obstruction, without gangrene, not specified as recurrent: Secondary | ICD-10-CM

## 2022-11-14 DIAGNOSIS — N390 Urinary tract infection, site not specified: Secondary | ICD-10-CM | POA: Diagnosis not present

## 2022-11-14 DIAGNOSIS — K4090 Unilateral inguinal hernia, without obstruction or gangrene, not specified as recurrent: Secondary | ICD-10-CM

## 2022-11-14 DIAGNOSIS — D649 Anemia, unspecified: Secondary | ICD-10-CM | POA: Diagnosis not present

## 2022-11-14 DIAGNOSIS — J449 Chronic obstructive pulmonary disease, unspecified: Secondary | ICD-10-CM | POA: Diagnosis not present

## 2022-11-14 DIAGNOSIS — E039 Hypothyroidism, unspecified: Secondary | ICD-10-CM | POA: Insufficient documentation

## 2022-11-14 DIAGNOSIS — R1030 Lower abdominal pain, unspecified: Secondary | ICD-10-CM | POA: Diagnosis not present

## 2022-11-14 DIAGNOSIS — E785 Hyperlipidemia, unspecified: Secondary | ICD-10-CM

## 2022-11-14 DIAGNOSIS — Z87891 Personal history of nicotine dependence: Secondary | ICD-10-CM

## 2022-11-14 DIAGNOSIS — I10 Essential (primary) hypertension: Secondary | ICD-10-CM | POA: Diagnosis present

## 2022-11-14 DIAGNOSIS — Z79899 Other long term (current) drug therapy: Secondary | ICD-10-CM | POA: Insufficient documentation

## 2022-11-14 DIAGNOSIS — N1831 Chronic kidney disease, stage 3a: Secondary | ICD-10-CM | POA: Diagnosis not present

## 2022-11-14 DIAGNOSIS — J9611 Chronic respiratory failure with hypoxia: Secondary | ICD-10-CM | POA: Diagnosis present

## 2022-11-14 DIAGNOSIS — I129 Hypertensive chronic kidney disease with stage 1 through stage 4 chronic kidney disease, or unspecified chronic kidney disease: Secondary | ICD-10-CM | POA: Diagnosis not present

## 2022-11-14 DIAGNOSIS — Z853 Personal history of malignant neoplasm of breast: Secondary | ICD-10-CM | POA: Diagnosis not present

## 2022-11-14 DIAGNOSIS — C50912 Malignant neoplasm of unspecified site of left female breast: Secondary | ICD-10-CM

## 2022-11-14 DIAGNOSIS — D500 Iron deficiency anemia secondary to blood loss (chronic): Secondary | ICD-10-CM

## 2022-11-14 DIAGNOSIS — R7309 Other abnormal glucose: Secondary | ICD-10-CM

## 2022-11-14 DIAGNOSIS — E782 Mixed hyperlipidemia: Secondary | ICD-10-CM | POA: Diagnosis present

## 2022-11-14 DIAGNOSIS — Z96649 Presence of unspecified artificial hip joint: Secondary | ICD-10-CM | POA: Insufficient documentation

## 2022-11-14 DIAGNOSIS — E1165 Type 2 diabetes mellitus with hyperglycemia: Secondary | ICD-10-CM | POA: Diagnosis not present

## 2022-11-14 DIAGNOSIS — K56600 Partial intestinal obstruction, unspecified as to cause: Secondary | ICD-10-CM | POA: Diagnosis not present

## 2022-11-14 DIAGNOSIS — Z7952 Long term (current) use of systemic steroids: Secondary | ICD-10-CM | POA: Insufficient documentation

## 2022-11-14 DIAGNOSIS — E119 Type 2 diabetes mellitus without complications: Secondary | ICD-10-CM

## 2022-11-14 DIAGNOSIS — K460 Unspecified abdominal hernia with obstruction, without gangrene: Secondary | ICD-10-CM | POA: Diagnosis present

## 2022-11-14 DIAGNOSIS — F17200 Nicotine dependence, unspecified, uncomplicated: Secondary | ICD-10-CM | POA: Diagnosis not present

## 2022-11-14 DIAGNOSIS — Z7982 Long term (current) use of aspirin: Secondary | ICD-10-CM | POA: Insufficient documentation

## 2022-11-14 DIAGNOSIS — E1122 Type 2 diabetes mellitus with diabetic chronic kidney disease: Secondary | ICD-10-CM | POA: Insufficient documentation

## 2022-11-14 DIAGNOSIS — M109 Gout, unspecified: Secondary | ICD-10-CM | POA: Diagnosis present

## 2022-11-14 DIAGNOSIS — R1032 Left lower quadrant pain: Secondary | ICD-10-CM | POA: Diagnosis present

## 2022-11-14 HISTORY — PX: INGUINAL HERNIA REPAIR: SHX194

## 2022-11-14 LAB — COMPREHENSIVE METABOLIC PANEL
ALT: 15 U/L (ref 0–44)
AST: 23 U/L (ref 15–41)
Albumin: 4.1 g/dL (ref 3.5–5.0)
Alkaline Phosphatase: 64 U/L (ref 38–126)
Anion gap: 10 (ref 5–15)
BUN: 26 mg/dL — ABNORMAL HIGH (ref 8–23)
CO2: 31 mmol/L (ref 22–32)
Calcium: 9.4 mg/dL (ref 8.9–10.3)
Chloride: 96 mmol/L — ABNORMAL LOW (ref 98–111)
Creatinine, Ser: 0.85 mg/dL (ref 0.44–1.00)
GFR, Estimated: >60 mL/min (ref >60)
Glucose, Bld: 157 mg/dL — ABNORMAL HIGH (ref 70–99)
Potassium: 3.8 mmol/L (ref 3.5–5.1)
Sodium: 137 mmol/L (ref 135–145)
Total Bilirubin: 0.7 mg/dL (ref 0.3–1.2)
Total Protein: 8.6 g/dL — ABNORMAL HIGH (ref 6.5–8.1)

## 2022-11-14 LAB — URINALYSIS, ROUTINE W REFLEX MICROSCOPIC
Bilirubin Urine: NEGATIVE
Glucose, UA: NEGATIVE mg/dL
Ketones, ur: NEGATIVE mg/dL
Nitrite: NEGATIVE
Protein, ur: 100 mg/dL — AB
Specific Gravity, Urine: 1.01 (ref 1.005–1.030)
pH: 6.5 (ref 5.0–8.0)

## 2022-11-14 LAB — CBC WITH DIFFERENTIAL/PLATELET
Abs Immature Granulocytes: 0.03 10*3/uL (ref 0.00–0.07)
Basophils Absolute: 0 10*3/uL (ref 0.0–0.1)
Basophils Relative: 0 %
Eosinophils Absolute: 0.3 10*3/uL (ref 0.0–0.5)
Eosinophils Relative: 2 %
HCT: 35.3 % — ABNORMAL LOW (ref 36.0–46.0)
Hemoglobin: 11.4 g/dL — ABNORMAL LOW (ref 12.0–15.0)
Immature Granulocytes: 0 %
Lymphocytes Relative: 17 %
Lymphs Abs: 1.9 10*3/uL (ref 0.7–4.0)
MCH: 30.5 pg (ref 26.0–34.0)
MCHC: 32.3 g/dL (ref 30.0–36.0)
MCV: 94.4 fL (ref 80.0–100.0)
Monocytes Absolute: 0.8 10*3/uL (ref 0.1–1.0)
Monocytes Relative: 7 %
Neutro Abs: 8 10*3/uL — ABNORMAL HIGH (ref 1.7–7.7)
Neutrophils Relative %: 74 %
Platelets: 228 10*3/uL (ref 150–400)
RBC: 3.74 MIL/uL — ABNORMAL LOW (ref 3.87–5.11)
RDW: 13 % (ref 11.5–15.5)
WBC: 10.9 10*3/uL — ABNORMAL HIGH (ref 4.0–10.5)
nRBC: 0 % (ref 0.0–0.2)

## 2022-11-14 LAB — LACTIC ACID, PLASMA: Lactic Acid, Venous: 1 mmol/L (ref 0.5–1.9)

## 2022-11-14 LAB — GLUCOSE, CAPILLARY: Glucose-Capillary: 106 mg/dL — ABNORMAL HIGH (ref 70–99)

## 2022-11-14 LAB — URINALYSIS, MICROSCOPIC (REFLEX)
RBC / HPF: >50 RBC/hpf (ref 0–5)
WBC, UA: >50 WBC/hpf (ref 0–5)

## 2022-11-14 SURGERY — REPAIR, HERNIA, INGUINAL, INCARCERATED
Anesthesia: General | Site: Inguinal | Laterality: Left

## 2022-11-14 MED ORDER — BUPIVACAINE HCL (300 MG DOSE) 3 X 100 MG IL IMPL
DRUG_IMPLANT | Status: AC
  Filled 2022-11-14: qty 100

## 2022-11-14 MED ORDER — HYDROMORPHONE HCL 1 MG/ML IJ SOLN: 0.5000 mg | INTRAMUSCULAR | Status: DC | PRN

## 2022-11-14 MED ORDER — DEXAMETHASONE SODIUM PHOSPHATE 10 MG/ML IJ SOLN
INTRAMUSCULAR | Status: AC
  Filled 2022-11-14: qty 1

## 2022-11-14 MED ORDER — MORPHINE SULFATE (PF) 4 MG/ML IV SOLN
4.0000 mg | Freq: Once | INTRAVENOUS | Status: AC
  Administered 2022-11-14: 4 mg via INTRAVENOUS
  Filled 2022-11-14: qty 1

## 2022-11-14 MED ORDER — HYDRALAZINE HCL 20 MG/ML IJ SOLN: 10.0000 mg | INTRAMUSCULAR | Status: DC | PRN

## 2022-11-14 MED ORDER — CLINDAMYCIN PHOSPHATE 900 MG/50ML IV SOLN
900.0000 mg | INTRAVENOUS | Status: AC
  Administered 2022-11-14: 900 mg via INTRAVENOUS
  Filled 2022-11-14: qty 50

## 2022-11-14 MED ORDER — SODIUM CHLORIDE 0.9% FLUSH
3.0000 mL | Freq: Two times a day (BID) | INTRAVENOUS | Status: DC
  Administered 2022-11-15: 3 mL via INTRAVENOUS

## 2022-11-14 MED ORDER — SODIUM CHLORIDE 0.9 % IV SOLN: INTRAVENOUS | Status: AC

## 2022-11-14 MED ORDER — CHLORHEXIDINE GLUCONATE CLOTH 2 % EX PADS: 6.0000 | MEDICATED_PAD | Freq: Once | CUTANEOUS | Status: DC

## 2022-11-14 MED ORDER — ROCURONIUM BROMIDE 10 MG/ML (PF) SYRINGE
PREFILLED_SYRINGE | INTRAVENOUS | Status: DC | PRN
  Administered 2022-11-14: 50 mg via INTRAVENOUS

## 2022-11-14 MED ORDER — EPHEDRINE SULFATE-NACL 50-0.9 MG/10ML-% IV SOSY
PREFILLED_SYRINGE | INTRAVENOUS | Status: DC | PRN
  Administered 2022-11-14 (×2): 5 mg via INTRAVENOUS
  Administered 2022-11-14: 10 mg via INTRAVENOUS
  Administered 2022-11-14 (×3): 5 mg via INTRAVENOUS
  Administered 2022-11-14: 10 mg via INTRAVENOUS

## 2022-11-14 MED ORDER — PROPOFOL 10 MG/ML IV BOLUS
INTRAVENOUS | Status: AC
  Filled 2022-11-14: qty 20

## 2022-11-14 MED ORDER — ALBUTEROL SULFATE (2.5 MG/3ML) 0.083% IN NEBU: 2.5000 mg | INHALATION_SOLUTION | Freq: Four times a day (QID) | RESPIRATORY_TRACT | Status: DC | PRN

## 2022-11-14 MED ORDER — FENTANYL CITRATE (PF) 100 MCG/2ML IJ SOLN
INTRAMUSCULAR | Status: AC
  Filled 2022-11-14: qty 2

## 2022-11-14 MED ORDER — FENTANYL CITRATE (PF) 100 MCG/2ML IJ SOLN
INTRAMUSCULAR | Status: DC | PRN
  Administered 2022-11-14: 25 ug via INTRAVENOUS

## 2022-11-14 MED ORDER — OXYCODONE HCL 5 MG/5ML PO SOLN: 5.0000 mg | Freq: Once | ORAL | Status: DC | PRN

## 2022-11-14 MED ORDER — DEXAMETHASONE SODIUM PHOSPHATE 10 MG/ML IJ SOLN
INTRAMUSCULAR | Status: DC | PRN
  Administered 2022-11-14: 10 mg via INTRAVENOUS

## 2022-11-14 MED ORDER — LIDOCAINE 2% (20 MG/ML) 5 ML SYRINGE
INTRAMUSCULAR | Status: DC | PRN
  Administered 2022-11-14: 80 mg via INTRAVENOUS

## 2022-11-14 MED ORDER — HYDROMORPHONE HCL 1 MG/ML IJ SOLN
0.5000 mg | Freq: Once | INTRAMUSCULAR | Status: AC
  Administered 2022-11-14: 0.5 mg via INTRAVENOUS
  Filled 2022-11-14: qty 0.5

## 2022-11-14 MED ORDER — ONDANSETRON HCL 4 MG/2ML IJ SOLN
INTRAMUSCULAR | Status: AC
  Filled 2022-11-14: qty 2

## 2022-11-14 MED ORDER — ONDANSETRON HCL 4 MG/2ML IJ SOLN
INTRAMUSCULAR | Status: DC | PRN
  Administered 2022-11-14: 4 mg via INTRAVENOUS

## 2022-11-14 MED ORDER — BUPIVACAINE HCL (300 MG DOSE) 3 X 100 MG IL IMPL
DRUG_IMPLANT | Status: DC | PRN
  Administered 2022-11-14: 300 mg

## 2022-11-14 MED ORDER — ONDANSETRON 4 MG PO TBDP: 4.0000 mg | ORAL_TABLET | Freq: Three times a day (TID) | ORAL | Status: DC | PRN

## 2022-11-14 MED ORDER — PHENYLEPHRINE 80 MCG/ML (10ML) SYRINGE FOR IV PUSH (FOR BLOOD PRESSURE SUPPORT)
PREFILLED_SYRINGE | INTRAVENOUS | Status: AC
  Filled 2022-11-14: qty 20

## 2022-11-14 MED ORDER — ONDANSETRON HCL 4 MG/2ML IJ SOLN: 4.0000 mg | Freq: Once | INTRAMUSCULAR | Status: DC | PRN

## 2022-11-14 MED ORDER — SODIUM CHLORIDE 0.9 % IV BOLUS
500.0000 mL | Freq: Once | INTRAVENOUS | Status: AC
  Administered 2022-11-14: 500 mL via INTRAVENOUS

## 2022-11-14 MED ORDER — ORAL CARE MOUTH RINSE: 15.0000 mL | Freq: Once | OROMUCOSAL | Status: DC

## 2022-11-14 MED ORDER — ROCURONIUM BROMIDE 10 MG/ML (PF) SYRINGE
PREFILLED_SYRINGE | INTRAVENOUS | Status: AC
  Filled 2022-11-14: qty 10

## 2022-11-14 MED ORDER — LACTATED RINGERS IV SOLN: INTRAVENOUS | Status: DC | PRN

## 2022-11-14 MED ORDER — ACETAMINOPHEN 500 MG PO TABS
1000.0000 mg | ORAL_TABLET | Freq: Four times a day (QID) | ORAL | Status: DC
  Administered 2022-11-14 – 2022-11-15 (×2): 1000 mg via ORAL
  Filled 2022-11-14 (×3): qty 2

## 2022-11-14 MED ORDER — FENTANYL CITRATE PF 50 MCG/ML IJ SOSY: 25.0000 ug | PREFILLED_SYRINGE | INTRAMUSCULAR | Status: DC | PRN

## 2022-11-14 MED ORDER — PHENYLEPHRINE 80 MCG/ML (10ML) SYRINGE FOR IV PUSH (FOR BLOOD PRESSURE SUPPORT)
PREFILLED_SYRINGE | INTRAVENOUS | Status: DC | PRN
  Administered 2022-11-14: 40 ug via INTRAVENOUS
  Administered 2022-11-14: 80 ug via INTRAVENOUS
  Administered 2022-11-14: 160 ug via INTRAVENOUS
  Administered 2022-11-14: 80 ug via INTRAVENOUS
  Administered 2022-11-14: 40 ug via INTRAVENOUS
  Administered 2022-11-14: 80 ug via INTRAVENOUS
  Administered 2022-11-14: 160 ug via INTRAVENOUS

## 2022-11-14 MED ORDER — OXYCODONE HCL 5 MG PO TABS: 5.0000 mg | ORAL_TABLET | Freq: Four times a day (QID) | ORAL | Status: DC | PRN

## 2022-11-14 MED ORDER — IOHEXOL 300 MG/ML  SOLN
100.0000 mL | Freq: Once | INTRAMUSCULAR | Status: AC | PRN
  Administered 2022-11-14: 100 mL via INTRAVENOUS

## 2022-11-14 MED ORDER — SODIUM CHLORIDE 0.9 % IV SOLN: INTRAVENOUS | Status: DC

## 2022-11-14 MED ORDER — PROPOFOL 10 MG/ML IV BOLUS
INTRAVENOUS | Status: DC | PRN
  Administered 2022-11-14: 120 mg via INTRAVENOUS

## 2022-11-14 MED ORDER — CHLORHEXIDINE GLUCONATE 0.12 % MT SOLN
OROMUCOSAL | Status: AC
  Filled 2022-11-14: qty 15

## 2022-11-14 MED ORDER — OXYCODONE HCL 5 MG PO TABS: 5.0000 mg | ORAL_TABLET | Freq: Once | ORAL | Status: DC | PRN

## 2022-11-14 MED ORDER — EPHEDRINE 5 MG/ML INJ
INTRAVENOUS | Status: AC
  Filled 2022-11-14: qty 10

## 2022-11-14 MED ORDER — FLUTICASONE FUROATE-VILANTEROL 100-25 MCG/ACT IN AEPB
1.0000 | INHALATION_SPRAY | Freq: Every day | RESPIRATORY_TRACT | Status: DC
  Administered 2022-11-15: 1 via RESPIRATORY_TRACT
  Filled 2022-11-14: qty 28

## 2022-11-14 MED ORDER — SODIUM CHLORIDE 0.9 % IR SOLN
Status: DC | PRN
  Administered 2022-11-14: 1000 mL

## 2022-11-14 MED ORDER — SUGAMMADEX SODIUM 200 MG/2ML IV SOLN
INTRAVENOUS | Status: DC | PRN
  Administered 2022-11-14: 200 mg via INTRAVENOUS

## 2022-11-14 MED ORDER — DIPHENHYDRAMINE HCL 50 MG/ML IJ SOLN
25.0000 mg | Freq: Once | INTRAMUSCULAR | Status: AC
  Administered 2022-11-14: 25 mg via INTRAVENOUS
  Filled 2022-11-14: qty 1

## 2022-11-14 MED ORDER — ENOXAPARIN SODIUM 40 MG/0.4ML IJ SOSY
40.0000 mg | PREFILLED_SYRINGE | INTRAMUSCULAR | Status: DC
  Administered 2022-11-15: 40 mg via SUBCUTANEOUS
  Filled 2022-11-14: qty 0.4

## 2022-11-14 MED ORDER — CHLORHEXIDINE GLUCONATE 0.12 % MT SOLN: 15.0000 mL | Freq: Once | OROMUCOSAL | Status: DC

## 2022-11-14 MED ORDER — BUPIVACAINE LIPOSOME 1.3 % IJ SUSP
INTRAMUSCULAR | Status: AC
  Filled 2022-11-14: qty 20

## 2022-11-14 MED ORDER — LACTATED RINGERS IV SOLN: INTRAVENOUS | Status: DC

## 2022-11-14 MED ORDER — LIDOCAINE HCL (PF) 2 % IJ SOLN
INTRAMUSCULAR | Status: AC
  Filled 2022-11-14: qty 5

## 2022-11-14 SURGICAL SUPPLY — 33 items
ADH SKN CLS APL DERMABOND .7 (GAUZE/BANDAGES/DRESSINGS) ×1
BLADE SURG 15 STRL LF DISP TIS (BLADE) IMPLANT
BLADE SURG 15 STRL SS (BLADE) ×1
COVER LIGHT HANDLE STERIS (MISCELLANEOUS) ×2 IMPLANT
DERMABOND ADVANCED .7 DNX12 (GAUZE/BANDAGES/DRESSINGS) ×1 IMPLANT
ELECT REM PT RETURN 9FT ADLT (ELECTROSURGICAL) ×1
ELECTRODE REM PT RTRN 9FT ADLT (ELECTROSURGICAL) ×1 IMPLANT
GAUZE SPONGE 4X4 12PLY STRL (GAUZE/BANDAGES/DRESSINGS) ×1 IMPLANT
GLOVE BIO SURGEON STRL SZ 6.5 (GLOVE) ×1 IMPLANT
GLOVE BIO SURGEON STRL SZ7 (GLOVE) IMPLANT
GLOVE BIOGEL PI IND STRL 6.5 (GLOVE) ×1 IMPLANT
GLOVE BIOGEL PI IND STRL 7.0 (GLOVE) ×2 IMPLANT
GLOVE SURG SS PI 7.0 STRL IVOR (GLOVE) IMPLANT
GOWN STRL REUS W/TWL LRG LVL3 (GOWN DISPOSABLE) ×3 IMPLANT
INST SET MINOR GENERAL (KITS) ×1 IMPLANT
KIT TURNOVER KIT A (KITS) ×1 IMPLANT
MANIFOLD NEPTUNE II (INSTRUMENTS) ×1 IMPLANT
MESH HERNIA 1.6X1.9 PLUG LRG (Mesh General) IMPLANT
NS IRRIG 1000ML POUR BTL (IV SOLUTION) ×1 IMPLANT
PACK MINOR (CUSTOM PROCEDURE TRAY) ×1 IMPLANT
PAD ARMBOARD 7.5X6 YLW CONV (MISCELLANEOUS) ×1 IMPLANT
SET BASIN LINEN APH (SET/KITS/TRAYS/PACK) ×1 IMPLANT
SOL PREP PROV IODINE SCRUB 4OZ (MISCELLANEOUS) ×1 IMPLANT
SPONGE T-LAP 18X18 ~~LOC~~+RFID (SPONGE) IMPLANT
SUT MNCRL AB 4-0 PS2 18 (SUTURE) ×1 IMPLANT
SUT NOVA NAB GS-22 2 2-0 T-19 (SUTURE) IMPLANT
SUT SILK 2 0 FSL 18 (SUTURE) IMPLANT
SUT SILK 3 0 (SUTURE) ×1
SUT SILK 3-0 18XBRD TIE 12 (SUTURE) IMPLANT
SUT VIC AB 2-0 CT1 27 (SUTURE) ×1
SUT VIC AB 2-0 CT1 TAPERPNT 27 (SUTURE) ×1 IMPLANT
SUT VIC AB 3-0 SH 27 (SUTURE) ×2
SUT VIC AB 3-0 SH 27X BRD (SUTURE) ×1 IMPLANT

## 2022-11-14 NOTE — Anesthesia Procedure Notes (Signed)
Procedure Name: Intubation Date/Time: 11/14/2022 11:19 AM  Performed by: Genelle Bal, CRNAPre-anesthesia Checklist: Emergency Drugs available, Patient identified, Suction available, Patient being monitored and Timeout performed Patient Re-evaluated:Patient Re-evaluated prior to induction Oxygen Delivery Method: Circle system utilized Preoxygenation: Pre-oxygenation with 100% oxygen Induction Type: IV induction Ventilation: Mask ventilation without difficulty Laryngoscope Size: Mac and 3 Grade View: Grade II Tube size: 6.5 mm Airway Equipment and Method: Stylet Placement Confirmation: ETT inserted through vocal cords under direct vision, positive ETCO2 and breath sounds checked- equal and bilateral Secured at: 21 cm Tube secured with: Tape

## 2022-11-14 NOTE — Anesthesia Preprocedure Evaluation (Signed)
Anesthesia Evaluation  Patient identified by MRN, date of birth, ID band Patient awake    Reviewed: Allergy & Precautions, H&P , NPO status , Patient's Chart, lab work & pertinent test results, reviewed documented beta blocker date and time   Airway Mallampati: II  TM Distance: >3 FB Neck ROM: full    Dental no notable dental hx.    Pulmonary neg pulmonary ROS, COPD,  oxygen dependent, former smoker   Pulmonary exam normal breath sounds clear to auscultation       Cardiovascular Exercise Tolerance: Good hypertension, negative cardio ROS  Rhythm:regular Rate:Normal     Neuro/Psych negative neurological ROS  negative psych ROS   GI/Hepatic negative GI ROS, Neg liver ROS,GERD  ,,  Endo/Other  negative endocrine ROSdiabetes, Type 2Hypothyroidism    Renal/GU Renal diseasenegative Renal ROS  negative genitourinary   Musculoskeletal   Abdominal   Peds  Hematology negative hematology ROS (+) Blood dyscrasia, anemia   Anesthesia Other Findings   Reproductive/Obstetrics negative OB ROS                             Anesthesia Physical Anesthesia Plan  ASA: 4 and emergent  Anesthesia Plan: General and General ETT   Post-op Pain Management:    Induction:   PONV Risk Score and Plan: Ondansetron  Airway Management Planned:   Additional Equipment:   Intra-op Plan:   Post-operative Plan:   Informed Consent: I have reviewed the patients History and Physical, chart, labs and discussed the procedure including the risks, benefits and alternatives for the proposed anesthesia with the patient or authorized representative who has indicated his/her understanding and acceptance.     Dental Advisory Given  Plan Discussed with: CRNA  Anesthesia Plan Comments:        Anesthesia Quick Evaluation

## 2022-11-14 NOTE — H&P (Signed)
History and Physical    Patient: Paige Rhodes [id number] DOB: 1931/09/07 DOA: 11/14/2022 DOS: the patient was seen and examined on 11/14/2022 PCP: [name], MD  Patient coming from: Home  Chief Complaint:  Chief Complaint  Patient presents with   Abdominal Pain   HPI: Paige Rhodes is an 89 y.o. female with medical history of HTN, T2DM, HLD, 3.5L O2-dependent COPD, breast CA s/p lumpectomy, stage IIIa CKD, GERD, hypothyroidism, and gout who presented to the ED around 3am today with abrupt onset of severe left lower abdominal pain with associated palpable knot. Symptoms have been constant, worse with some palpation limiting exam. CT demonstrated early or partial SBO with left groin hernia containing small bowel. WBC 10.9k, lactic acid normal. Surgery was consulted and recommended medicine admission with plans to reduce the hernia operatively.   Review of Systems: As mentioned in the history of present illness. All other systems reviewed and are negative. Past Medical History:  Diagnosis Date   Breast cancer ([place])    Chronic renal disease, stage 2, mildly decreased glomerular filtration rate between 60-89 mL/min/1.73 square meter 10/31/2014   COPD (chronic obstructive pulmonary disease) ([place])    Diabetes mellitus    GERD (gastroesophageal reflux disease)    occasional   Glaucoma    Gout attack june 2013   left foot   Hard of hearing    Hyperlipidemia    Hypertension    Hypothyroidism    Infiltrating ductal carcinoma of left breast 03/05/2011   On home oxygen therapy    2 LPM   Osteoporosis    Port catheter in place 01/15/2012   SOB (shortness of breath)    Thyroid disease    Past Surgical History:  Procedure Laterality Date   [employer] LUMPECTOMY  2011   left   CATARACT EXTRACTION W/PHACO  09/03/2012   Procedure: CATARACT EXTRACTION PHACO AND INTRAOCULAR LENS PLACEMENT ([place]);  Surgeon: Tonny Branch, MD;  Location: [facility];  Service:  Ophthalmology;  Laterality: Right;  CDE: 15.26   CATARACT EXTRACTION W/PHACO  09/28/2012   Procedure: CATARACT EXTRACTION PHACO AND INTRAOCULAR LENS PLACEMENT ([facility]);  Surgeon: Tonny Branch, MD;  Location: [facility];  Service: Ophthalmology;  Laterality: Left;  CDE:  21.60   CHOLECYSTECTOMY  1990   OPEN REDUCTION INTERNAL FIXATION (ORIF) DISTAL RADIAL FRACTURE Left 07/01/2019   Procedure: OPEN REDUCTION INTERNAL FIXATION (ORIF) LEFT DISTAL RADIAL FRACTURE;  Surgeon: Carole Civil, MD;  Location: [facility];  Service: Orthopedics;  Laterality: Left;   PORT-A-CATH REMOVAL N/A 04/26/2015   Procedure: REMOVAL PORT-A-CATH;  Surgeon: Aviva Signs, MD;  Location: [facility];  Service: General;  Laterality: N/A;   PORTACATH PLACEMENT  05/23/10   THYROID SURGERY  1994   TONSILLECTOMY     TOTAL HIP ARTHROPLASTY  1996   Social History:  reports that she quit smoking about 31 years ago. Her smoking use included cigarettes. She has a 90.00 pack-year smoking history. She has never used smokeless tobacco. She reports that she does not drink alcohol and does not use drugs.  Allergies  Allergen Reactions   Penicillins Anaphylaxis    Edema of throat   Hctz [Hydrochlorothiazide] Other (See Comments)    gout   Morphine And Related Itching   Lotrel [Amlodipine Besy-Benazepril Hcl] Other (See Comments)    Facial muscles draw and break out in whilps    Family History  Problem Relation Age of Onset   Kidney  disease Mother    Heart disease Father    Hypertension Brother    Cancer Sister        breast and lung    Prior to Admission medications   Medication Sig Start Date End Date Taking? Authorizing Provider  albuterol (VENTOLIN HFA) 108 (90 Base) MCG/ACT inhaler Inhale 2 puffs into the lungs every 6 (six) hours as needed for wheezing or shortness of breath. 12/21/20  Yes [provider]  amLODipine (NORVASC) 5 MG tablet Take 5 mg by mouth daily.    Yes [provider]  Ascorbic Acid (VITAMIN C  PO) Take 1 tablet by mouth daily.   Yes [provider]  aspirin EC 81 MG tablet Take 81 mg by mouth daily.   Yes [provider]  atorvastatin (LIPITOR) 10 MG tablet Take 10 mg by mouth daily. 08/18/19  Yes [provider]  Calcium-Magnesium-Vitamin D (CALCIUM 500 PO) Take 1 tablet by mouth daily.   Yes [provider]  carvedilol (COREG) 25 MG tablet Take 25 mg by mouth 2 (two) times daily with a meal.     Yes [provider]  Cholecalciferol (VITAMIN D-3) 1000 units CAPS Take 1,000 Units by mouth daily.   Yes [provider]  diphenhydrAMINE HCl, Sleep, 50 MG CAPS Take 50 mg by mouth at bedtime.   Yes [provider]  estradiol (ESTRACE) 0.1 MG/GM vaginal cream Use 1/2 inch in applicator per vagina 2 nights/week 10/22/22  Yes Dahlstedt, Annie Main, MD  fluticasone furoate-vilanterol (BREO ELLIPTA) 100-25 MCG/ACT AEPB Inhale 1 puff into the lungs daily. 03/21/22  Yes Tanda Rockers, MD  fosfomycin (MONUROL) 3 g PACK Take 3 g by mouth once. 10/04/22  Yes [provider]  lactobacillus acidophilus (BACID) TABS tablet Take 2 tablets by mouth 3 (three) times daily.   Yes [provider]  levothyroxine (SYNTHROID, LEVOTHROID) 125 MCG tablet Take 125 mcg by mouth daily.     Yes [provider]  losartan (COZAAR) 50 MG tablet Take 50 mg by mouth daily.     Yes [provider]  Multiple Vitamin (MULTIVITAMIN) tablet Take 1 tablet by mouth daily.   Yes [provider]    Physical Exam: Vitals:   11/14/22 1401 11/14/22 1410 11/14/22 1426 11/14/22 1634  BP: (!) 125/53  (!) 134/59   Pulse: 63 60 64   Resp: 15 17    Temp:   (!) 97.5 F (36.4 C)   TempSrc:   Oral   SpO2: 90% 93% (!) 89% 97%  Weight:      Gen: Elderly pleasant, HOH female in no distress having eaten full clear liquid tray this afternoon postoperatively with no abdominal pain Pulm: Clear, nonlabored, diminished, no wheezes or crackles   CV: RRR, no pitting edema or JVD GI: Soft, NT, ND, +BS. LLQ transverse incision site c/d/I with dermabond, mild settling ecchymosis without discharge, minimally tender.  Neuro: Alert and oriented. No new focal deficits. Ext: Warm, no deformities. Skin: No other rashes, lesions or ulcers on visualized skin   Data Reviewed: BUN 26, Cr 0.85, LFTs wnl. Lactic acid 1.0. WBC 10.9k (8k PMNs), hgb 11.4g/dl, platelets 228k.   UA w/few bacteria, budding yeast, >50 RBCs and WBCs / HPF.   Small bowel loop within left groin hernia without significant fat stranding. Proximal dilatation is mild. (personal interpretation).   Assessment and Plan: Incarcerated hernia:  - Surgery took for operative reduction and mesh repair 3/14, requests overnight observation and advancement of  diet. Tolerating clear liquids, will order for fulls and ADAT to soft in AM.  - Monitor exam, currently very benign.  - Scheduled tylenol, prn meds ordered. Prn zofran.  Chronic hypoxic respiratory failure, COPD: At baseline.  - Continue home O2 and meds  HTN:  - Continue home meds once confirmed. Currently normotensive, ok to restart in AM as long as not severely elevated BP.   HLD:  - Can restart statin 3/15 after med rec.  T2DM: Diet-controlled - Monitor glucose on labs.   Hypothyroidism:  - Continue home synthroid pending med rec in AM.  Pyuria: No urinary symptoms reported. CT borderline bladder wall thickening though exam without suggestion of cystitis. Afebrile. - Monitor clinically off abx.  Biliary dilatation: Noted on CT, LFTs are wnl. Hx cholecystectomy.    Advance Care Planning: Full, advanced directive reviewed  Consults: Surgery  Family Communication: Daughter and granddaughter at bedside  Severity of Illness: The appropriate patient status for this patient is OBSERVATION. Observation status is judged to be reasonable and necessary in order to provide the required intensity of service to ensure the  patient's safety. The patient's presenting symptoms, physical exam findings, and initial radiographic and laboratory data in the context of their medical condition is felt to place them at decreased risk for further clinical deterioration. Furthermore, it is anticipated that the patient will be medically stable for discharge from the hospital within 2 midnights of admission.   Author: Patrecia Pour, MD 11/14/2022 6:28 PM  For on call review [email or web address].

## 2022-11-14 NOTE — Transfer of Care (Signed)
Immediate Anesthesia Transfer of Care Note  Patient: Paige Rhodes  Procedure(s) Performed: Nira Conn INGUINAL HERNIA REPAIR WITH MESH (Left: Inguinal)  Patient Location: PACU  Anesthesia Type:General  Level of Consciousness: awake, alert , and oriented  Airway & Oxygen Therapy: Patient Spontanous Breathing and Patient connected to face mask oxygen  Post-op Assessment: Report given to RN and Post -op Vital signs reviewed and stable  Post vital signs: Reviewed and stable  Last Vitals:  Vitals Value Taken Time  BP 134/50 11/14/22 1322  Temp    Pulse 34 11/14/22 1322  Resp 19 11/14/22 1322  SpO2 98 % 11/14/22 1322  Vitals shown include unvalidated device data.  Last Pain:  Vitals:   11/14/22 0854  TempSrc: Oral  PainSc: 0-No pain         Complications: No notable events documented.

## 2022-11-14 NOTE — ED Triage Notes (Signed)
Pt wears [place] 3.5 L at home

## 2022-11-14 NOTE — TOC Progression Note (Signed)
  Transition of Care [facility]) Screening Note   Patient Details  Name: Paige Rhodes Date of Birth: 04-12-1932   Transition of Care [facility]) CM/SW Contact:    Boneta Lucks, RN Phone Number: 11/14/2022, 11:36 AM    Transition of Care Department [facility]) has reviewed patient and no TOC needs have been identified at this time. We will continue to monitor patient advancement through interdisciplinary progression rounds. If new patient transition needs arise, please place a TOC consult.     Expected Discharge Plan: Home/Self Care Barriers to Discharge: Continued Medical Work up  Expected Discharge Plan and Services       Social Determinants of Health (SDOH) Interventions SDOH Screenings   Tobacco Use: Medium Risk (11/14/2022)    Readmission Risk Interventions    12/10/2021   10:08 AM  Readmission Risk Prevention Plan  Transportation Screening Complete  Home Care Screening Complete  Medication Review (RN CM) Complete

## 2022-11-14 NOTE — Consult Note (Signed)
[facility] Consult  Reason for Consult: Left inguinal hernia, incarcerated Referring Physician: Dr. Roxanne Mins  Chief Complaint   Abdominal Pain     HPI: Paige Rhodes is a 89 y.o. female who presents with a 10-hour history of left lower quadrant abdominal pain with a palpable knot.  She first noticed this area last night, which resulted in her presenting to the emergency department.  She has never had anything like this before, and denies ever having known history of a hernia.  She denies any nausea, vomiting, fever, and chills.  She has had some chronic constipation recently, but last had a bowel movement yesterday.  She was passing flatus this morning.  Her past medical history is significant for breast cancer, COPD on 3.5 L nasal cannula, hyperlipidemia, hypertension, and hypothyroidism.  She takes 81 mg aspirin daily, last dose being overnight.  Her surgical history is significant for appendectomy, left breast lumpectomy, and cholecystectomy.  In the ED, she underwent a CT abdomen and pelvis which demonstrated an early or partial small bowel obstruction due to small bowel containing left groin hernia.  She had mild leukocytosis of 10.9.  She had a normal lactic acid at 1.  Past Medical History:  Diagnosis Date   Breast cancer ([place])    Chronic renal disease, stage 2, mildly decreased glomerular filtration rate between 60-89 mL/min/1.73 square meter 10/31/2014   COPD (chronic obstructive pulmonary disease) ([place])    Diabetes mellitus    GERD (gastroesophageal reflux disease)    occasional   Glaucoma    Gout attack june 2013   left foot   Hard of hearing    Hyperlipidemia    Hypertension    Hypothyroidism    Infiltrating ductal carcinoma of left breast 03/05/2011   On home oxygen therapy    2 LPM   Osteoporosis    Port catheter in place 01/15/2012   SOB (shortness of breath)    Thyroid disease     Past Surgical History:  Procedure Laterality Date   [place] LUMPECTOMY  2011   left   CATARACT EXTRACTION W/PHACO  09/03/2012   Procedure: CATARACT EXTRACTION PHACO AND INTRAOCULAR LENS PLACEMENT ([place]);  Surgeon: Tonny Branch, MD;  Location: [facility];  Service: Ophthalmology;  Laterality: Right;  CDE: 15.26   CATARACT EXTRACTION W/PHACO  09/28/2012   Procedure: CATARACT EXTRACTION PHACO AND INTRAOCULAR LENS PLACEMENT (IOC);  Surgeon: Tonny Branch, MD;  Location: [facility];  Service: Ophthalmology;  Laterality: Left;  CDE:  21.60   CHOLECYSTECTOMY  1990   OPEN REDUCTION INTERNAL FIXATION (ORIF) DISTAL RADIAL FRACTURE Left 07/01/2019   Procedure: OPEN REDUCTION INTERNAL FIXATION (ORIF) LEFT DISTAL RADIAL FRACTURE;  Surgeon: Carole Civil, MD;  Location: [facility];  Service: Orthopedics;  Laterality: Left;   PORT-A-CATH REMOVAL N/A 04/26/2015   Procedure: REMOVAL PORT-A-CATH;  Surgeon: Aviva Signs, MD;  Location: [facility];  Service: General;  Laterality: N/A;   PORTACATH PLACEMENT  05/23/10   THYROID SURGERY  1994   TONSILLECTOMY     TOTAL HIP ARTHROPLASTY  1996    Family History  Problem Relation Age of Onset   Kidney disease Mother    Heart disease Father    Hypertension Brother    Cancer Sister        breast and lung    Social History   Tobacco Use   Smoking status: Former    Packs/day: 3.00    Years:  30.00    Additional pack years: 0.00    Total pack years: 90.00    Types: Cigarettes    Quit date: 09/02/1991    Years since quitting: 31.2   Smokeless tobacco: Never  Substance Use Topics   Alcohol use: No   Drug use: No    Medications: I have reviewed the patient's current medications. Prior to Admission: (Not in a hospital admission)  Scheduled: Continuous: PRN:  Allergies  Allergen Reactions   Penicillins Anaphylaxis    Edema of throat   Hctz [Hydrochlorothiazide] Other (See Comments)    gout   Morphine And Related Itching   Lotrel [Amlodipine Besy-Benazepril Hcl] Other (See Comments)     Facial muscles draw and break out in whilps     ROS:  Pertinent items are noted in HPI.  Blood pressure (!) 143/63, pulse (!) 58, temperature 98 F (36.7 C), resp. rate 17, weight 74.8 kg, SpO2 100 %. Physical Exam Vitals reviewed.  Constitutional:      Appearance: She is well-developed.  HENT:     Head: Normocephalic and atraumatic.     Comments: Nasal cannula in place, 3.5 L Eyes:     Extraocular Movements: Extraocular movements intact.     Pupils: Pupils are equal, round, and reactive to light.  Cardiovascular:     Rate and Rhythm: Normal rate.  Pulmonary:     Effort: Pulmonary effort is normal.  Abdominal:     Comments: Abdomen soft, nondistended, no percussion tenderness, nontender to palpation; no rigidity, guarding, rebound tenderness; palpable bulge in left groin, significant tenderness to palpation, unable to be reduced, no overlying skin changes  Skin:    General: Skin is warm and dry.  Neurological:     General: No focal deficit present.     Mental Status: She is alert and oriented to person, place, and time.  Psychiatric:        Mood and Affect: Mood normal.        Behavior: Behavior normal.     Results: Results for orders placed or performed during the hospital encounter of 11/14/22 (from the past 48 hour(s))  Urinalysis, Routine w reflex microscopic -Urine, Clean Catch     Status: Abnormal   Collection Time: 11/14/22 12:50 AM  Result Value Ref Range   Color, Urine YELLOW YELLOW   APPearance CLOUDY (A) CLEAR   Specific Gravity, Urine 1.010 1.005 - 1.030   pH 6.5 5.0 - 8.0   Glucose, UA NEGATIVE NEGATIVE mg/dL   Hgb urine dipstick SMALL (A) NEGATIVE   Bilirubin Urine NEGATIVE NEGATIVE   Ketones, ur NEGATIVE NEGATIVE mg/dL   Protein, ur 100 (A) NEGATIVE mg/dL   Nitrite NEGATIVE NEGATIVE   Leukocytes,Ua LARGE (A) NEGATIVE    Comment: Performed at [facility], [address] [place], [place] [address]  Urinalysis, Microscopic (reflex)     Status:  Abnormal   Collection Time: 11/14/22 12:50 AM  Result Value Ref Range   RBC / HPF >50 0 - 5 RBC/hpf   WBC, UA >50 0 - 5 WBC/hpf   Bacteria, UA FEW (A) NONE SEEN   Squamous Epithelial / HPF 0-5 0 - 5 /HPF   WBC Clumps PRESENT    Budding Yeast PRESENT     Comment: Performed at [facility], [address], [place], [place] [address]  Comprehensive metabolic panel     Status: Abnormal   Collection Time: 11/14/22  3:45 AM  Result Value Ref Range   Sodium 137 135 - 145  mmol/L   Potassium 3.8 3.5 - 5.1 mmol/L   Chloride 96 (L) 98 - 111 mmol/L   CO2 31 22 - 32 mmol/L   Glucose, Bld 157 (H) 70 - 99 mg/dL    Comment: Glucose reference range applies only to samples taken after fasting for at least 8 hours.   BUN 26 (H) 8 - 23 mg/dL   Creatinine, Ser 0.85 0.44 - 1.00 mg/dL   Calcium 9.4 8.9 - 10.3 mg/dL   Total Protein 8.6 (H) 6.5 - 8.1 g/dL   Albumin 4.1 3.5 - 5.0 g/dL   AST 23 15 - 41 U/L   ALT 15 0 - 44 U/L   Alkaline Phosphatase 64 38 - 126 U/L   Total Bilirubin 0.7 0.3 - 1.2 mg/dL   GFR, Estimated >60 >60 mL/min    Comment: (NOTE) Calculated using the CKD-EPI Creatinine Equation (2021)    Anion gap 10 5 - 15    Comment: Performed at [facility], [address], [place], [place] [address]  CBC with Differential     Status: Abnormal   Collection Time: 11/14/22  3:45 AM  Result Value Ref Range   WBC 10.9 (H) 4.0 - 10.5 K/uL   RBC 3.74 (L) 3.87 - 5.11 MIL/uL   Hemoglobin 11.4 (L) 12.0 - 15.0 g/dL   HCT 35.3 (L) 36.0 - 46.0 %   MCV 94.4 80.0 - 100.0 fL   MCH 30.5 26.0 - 34.0 pg   MCHC 32.3 30.0 - 36.0 g/dL   RDW 13.0 11.5 - 15.5 %   Platelets 228 150 - 400 K/uL   nRBC 0.0 0.0 - 0.2 %   Neutrophils Relative % 74 %   Neutro Abs 8.0 (H) 1.7 - 7.7 K/uL   Lymphocytes Relative 17 %   Lymphs Abs 1.9 0.7 - 4.0 K/uL   Monocytes Relative 7 %   Monocytes Absolute 0.8 0.1 - 1.0 K/uL   Eosinophils Relative 2 %   Eosinophils Absolute 0.3 0.0 - 0.5 K/uL   Basophils Relative 0 %    Basophils Absolute 0.0 0.0 - 0.1 K/uL   Immature Granulocytes 0 %   Abs Immature Granulocytes 0.03 0.00 - 0.07 K/uL    Comment: Performed at [facility], [address] [place], [place] [address]  Lactic acid, plasma     Status: None   Collection Time: 11/14/22  5:00 AM  Result Value Ref Range   Lactic Acid, Venous 1.0 0.5 - 1.9 mmol/L    Comment: Performed at [facility], [address], [place], [place] [address]    CT ABDOMEN PELVIS W CONTRAST  Result Date: 11/14/2022 CLINICAL DATA:  Left lower abdominal pain with knot in the groin. EXAM: CT ABDOMEN AND PELVIS WITH CONTRAST TECHNIQUE: Multidetector CT imaging of the abdomen and pelvis was performed using the standard protocol following bolus administration of intravenous contrast. RADIATION DOSE REDUCTION: This exam was performed according to the departmental dose-optimization program which includes automated exposure control, adjustment of the mA and/or kV according to patient size and/or use of iterative reconstruction technique. CONTRAST:  173m OMNIPAQUE IOHEXOL 300 MG/ML  SOLN COMPARISON:  None Available. FINDINGS: Lower chest: Asymmetric appearance of the breasts likely from asymmetric coverage. There is history of remote left breast cancer. Mammography performed 3 weeks ago. Hepatobiliary: No focal liver abnormality.Cholecystectomy. Intra and extrahepatic biliary dilatation without visible obstructing cause. Pancreas: Unremarkable. Spleen: Unremarkable. Adrenals/Urinary Tract: Negative adrenals. No hydronephrosis or stone. No worrisome renal lesion. Heterogeneous enhancement of the renal cortex  on the delayed phase is likely transient. No strong indication of pyelonephritis. Borderline bladder wall thickening. Stomach/Bowel: Fluid and gas-filled small bowel loops above the a left groin hernia, likely inguinal, which contains a loop of distended small bowel. No bowel inflammation noted. Colonic diverticulosis at the sigmoid especially.  Vascular/Lymphatic: No acute vascular abnormality. Atheromatous calcification of the aorta and iliacs. No mass or adenopathy. Reproductive:No pathologic findings. Other: No ascites or pneumoperitoneum. Musculoskeletal: No acute abnormalities. Ordinary degenerative change. Right hip replacement and left hip osteoarthritis. IMPRESSION: 1. Early or partial small bowel obstruction due to small bowel containing left groin hernia. 2. Biliary dilatation without visible cause. Correlate with liver function tests. 3. Additional incidental findings noted above. Electronically Signed   By: Jorje Guild M.D.   On: 11/14/2022 06:11     Assessment & Plan:  Paige Rhodes is a 89 y.o. female who presents with an incarcerated left groin hernia causing partial small bowel obstruction.  Imaging and blood work evaluated by myself.  -Given the patient's significant pain with attempted reduction even after administration of Dilaudid, explained to the patient that we would need to proceed to the operating room for reduction of the hernia and repair. -The risk and benefits of open left inguinal hernia repair, possible mesh, possible bowel resection were discussed including but not limited to bleeding, infection, injury to surrounding structures, need for additional procedures, respiratory complications, prolonged intubation, heart attack, stroke, and death.  After careful consideration, Paige Rhodes has decided to proceed with surgery. -We discussed that given her respiratory disease and age, she is at higher risk for respiratory complication after surgery -Admit to hospitalist -NPO -PRN pain control and antiemetics -No need for NG tube at this time, patient may require NG tube postsurgery -Further recommendations to follow surgery  All questions were answered to the satisfaction of the patient and family  -- Graciella Freer, DO [facility] [address] [place], [place]  [address] [phone] (office)

## 2022-11-14 NOTE — ED Notes (Signed)
Pt leaving for surgery. Daughter Lenna Sciara has all pt belongings including clothes, medication form home, and earrings, and hearing aids.

## 2022-11-14 NOTE — Op Note (Signed)
[facility] Operative Note  11/14/22  Preoperative Diagnosis: Incarcerated left inguinal hernia    Postoperative Diagnosis: Same   Procedure(s) Performed: Left inguinal hernia repair with mesh   Surgeon: Graciella Freer, DO    Assistants: Marquita Palms, RNFA   Anesthesia: General endotracheal   Anesthesiologist: Louann Sjogren, MD    Specimens: Hernia sac   Estimated Blood Loss: Minimal   Blood Replacement: None    Complications: None   Wound Class: Clean   Operative Indications: Patient is an 89 year old female who presented to the hospital with a 10-hour history of left groin bulge associated with abdominal pain.  In the ED, she underwent a CT abdomen and pelvis which demonstrated concern for an early partial small bowel obstruction secondary to an incarcerated left inguinal hernia containing loop of small bowel.  Hernia was unable to be reduced, so decision was made to take the patient to the operating room for surgical intervention.  Patient is agreeable to surgery.  All risks and benefits of performing this procedure were discussed with the patient including pain, infection, bleeding, damage to the surrounding structures, and need for more procedures or surgery. The patient voiced understanding of the procedure, all questions were sought and answered, and consent was obtained.  Findings: Direct left inguinal hernia containing loop of small bowel, small bowel appeared viable and was reduced within the abdomen   Procedure: The patient was taken to the operating room and placed supine. General endotracheal anesthesia was induced. Intravenous antibiotics were administered per protocol.  A time out was preformed verifying the correct patient, procedure, site, positioning and implants.  The left groin and scrotum were prepared and draped in the usual sterile fashion.   An incision was made in a natural skin crease between the pubic tubercle and the  anterior superior iliac spine.  The incision was deepened with electrocautery through Scarpa's and Camper's fascia until the aponeurosis of the external oblique was encountered.  This was cleaned and the external ring was exposed.  Initially, the hernia appeared to be femoral, as it appeared to be below the level of the inguinal ligament.  After further dissection and opening the defect medially, the inguinal ligament was noted to be inferior to the defect, and that this was a direct inguinal hernia that was coming directly through the external ring. An incision was made in the midportion of the external oblique aponeurosis in the direction of its fibers. The ilioinguinal nerve was not identified.  Flaps of the external oblique were developed cephalad and inferiorly.    The round ligament was identified and suture-ligated.  There was a direct hernia sac noted in the floor of the inguinal canal.  The sac was carefully dissected free from surrounding structures.   The sac was entered and noted to contain small bowel, which was reduced as it appeared viable.  The hernia sac was closed with purse string stitch. The hernia was reduced into the abdomen without difficulty.  A pursestring stitch was placed around the floor to help reduce herniating contents. The Perfix Mesh Patch was sutured to the inguinal ligament inferiorly starting at the pubic tubercle using 2-0 Novafil interrupted sutures.  The mesh was sutured superiorly to the conjoint tendon using 2-0 Novafil interrupted sutures.  Care was taken to ensure the mesh was placed in a relaxed fashion to avoid excessive tension and no neurovascular structures were caught in the repair.  Laterally the tails of the mesh were sutured together, as there was no  need to recreate the internal ring.   Hemostasis was adequate.  The external oblique aponeurosis was closed with a 2-0 Vicryl suture in a running fashion, taking care to not catch the ilioinguinal nerve in the  suture line.  Scarpa's fashion was closed with 3-0 Vicryl suture in a running fashion.  The dermis was closed with interrupted 3-0 Vicryl sutures. The skin was closed with a subcuticular 4-0 Monocryl suture.  Dermabond was applied.  Xaracoll was inserted within all layers of closure.   The patient tolerated the procedure well and was taken to the PACU in stable condition. All counts were correct at the end of the case.     Graciella Freer, DO  [facility] [address] [place], [place] [address] [phone] (office)

## 2022-11-14 NOTE — ED Provider Notes (Signed)
[facility] Provider Note   CSN: [id number] Arrival date & time: 11/14/22  0326     History  Chief Complaint  Patient presents with   Abdominal Pain    Paige Rhodes is a 89 y.o. female.  The history is provided by the patient.  Abdominal Pain She has history of hypertension, diabetes, hyperlipidemia, breast cancer, chronic kidney disease, GERD, COPD, gout and comes in complaining of left lower quadrant pain which started about 10 PM and has been getting worse.  Pain radiates throughout her abdomen but not to the chest or back or shoulder.  She denies nausea or vomiting.  Last bowel movement was yesterday with aid of a laxative.  She denies any flatus today.  She denies any urinary difficulty.  She feels that there is a knot in her left lower abdomen.   Home Medications Prior to Admission medications   Medication Sig Start Date End Date Taking? Authorizing Provider  albuterol (VENTOLIN HFA) 108 (90 Base) MCG/ACT inhaler Inhale 2 puffs into the lungs every 6 (six) hours as needed for wheezing or shortness of breath. 12/21/20   [provider]  amLODipine (NORVASC) 5 MG tablet Take 5 mg by mouth daily.     [provider]  Ascorbic Acid (VITAMIN C PO) Take 1 tablet by mouth daily.    [provider]  aspirin EC 81 MG tablet Take 81 mg by mouth daily.    [provider]  atorvastatin (LIPITOR) 10 MG tablet Take 10 mg by mouth daily. 08/18/19   [provider]  Calcium-Magnesium-Vitamin D (CALCIUM 500 PO) Take 1 tablet by mouth daily.    [provider]  carvedilol (COREG) 25 MG tablet Take 25 mg by mouth 2 (two) times daily with a meal.      [provider]  Cholecalciferol (VITAMIN D-3) 1000 units CAPS Take 1,000 Units by mouth daily.    [provider]  diphenhydrAMINE HCl, Sleep, 50 MG CAPS Take 50 mg by mouth at bedtime.    [provider]  estradiol  (ESTRACE) 0.1 MG/GM vaginal cream Use 1/2 inch in applicator per vagina 2 nights/week 10/22/22   Franchot Gallo, MD  fluticasone furoate-vilanterol (BREO ELLIPTA) 100-25 MCG/ACT AEPB Inhale 1 puff into the lungs daily. 03/21/22   Tanda Rockers, MD  fosfomycin (MONUROL) 3 g PACK Take 3 g by mouth once. 10/04/22   [provider]  levothyroxine (SYNTHROID, LEVOTHROID) 125 MCG tablet Take 125 mcg by mouth daily.      [provider]  losartan (COZAAR) 50 MG tablet Take 50 mg by mouth daily.      [provider]  Multiple Vitamin (MULTIVITAMIN) tablet Take 1 tablet by mouth daily.    [provider]      Allergies    Penicillins, Hctz [hydrochlorothiazide], Morphine and related, and Lotrel [amlodipine besy-benazepril hcl]    Review of Systems   Review of Systems  Gastrointestinal:  Positive for abdominal pain.  All other systems reviewed and are negative.   Physical Exam Updated Vital Signs BP (!) 163/86   Pulse 66   Temp 98 F (36.7 C)   Resp (!) 27   Wt 74.8 kg   SpO2 100%   BMI 30.18 kg/m  Physical Exam Vitals and nursing note reviewed.   89 year old female, resting comfortably and in no acute distress. Vital signs are significant for elevated blood pressure respiratory rate. Oxygen saturation is 100%,  which is normal. Head is normocephalic and atraumatic. PERRLA, EOMI. Oropharynx is clear. Neck is nontender and supple without adenopathy or JVD. Back is nontender and there is no CVA tenderness. Lungs are clear without rales, wheezes, or rhonchi. Chest is nontender. Heart has regular rate and rhythm without murmur. Abdomen is soft, flat, nontender without masses.peristalsis is hypoactive. Extremities have no cyanosis or edema, full range of motion is present. Skin is warm and dry without rash. Neurologic: Mental status is normal, cranial nerves are intact, moves all extremities equally.  ED Results / Procedures / Treatments   Labs (all  labs ordered are listed, but only abnormal results are displayed) Labs Reviewed  COMPREHENSIVE METABOLIC PANEL - Abnormal; Notable for the following components:      Result Value   Chloride 96 (*)    Glucose, Bld 157 (*)    BUN 26 (*)    Total Protein 8.6 (*)    All other components within normal limits  CBC WITH DIFFERENTIAL/PLATELET - Abnormal; Notable for the following components:   WBC 10.9 (*)    RBC 3.74 (*)    Hemoglobin 11.4 (*)    HCT 35.3 (*)    Neutro Abs 8.0 (*)    All other components within normal limits  LACTIC ACID, PLASMA  URINALYSIS, ROUTINE W REFLEX MICROSCOPIC    EKG EKG Interpretation  Date/Time:  Thursday November 14 2022 03:43:55 EDT Ventricular Rate:  73 PR Interval:  185 QRS Duration: 138 QT Interval:  393 QTC Calculation: 433 R Axis:   24 Text Interpretation: Sinus rhythm Ventricular bigeminy Probable left atrial enlargement IVCD, consider atypical RBBB When compared with ECG of 12/07/2021, Premature ventricular complexes are now present Confirmed by Delora Fuel ([id number]) on 11/14/2022 3:46:38 AM  Radiology CT ABDOMEN PELVIS W CONTRAST  Result Date: 11/14/2022 CLINICAL DATA:  Left lower abdominal pain with knot in the groin. EXAM: CT ABDOMEN AND PELVIS WITH CONTRAST TECHNIQUE: Multidetector CT imaging of the abdomen and pelvis was performed using the standard protocol following bolus administration of intravenous contrast. RADIATION DOSE REDUCTION: This exam was performed according to the departmental dose-optimization program which includes automated exposure control, adjustment of the mA and/or kV according to patient size and/or use of iterative reconstruction technique. CONTRAST:  161m OMNIPAQUE IOHEXOL 300 MG/ML  SOLN COMPARISON:  None Available. FINDINGS: Lower chest: Asymmetric appearance of the breasts likely from asymmetric coverage. There is history of remote left breast cancer. Mammography performed 3 weeks ago. Hepatobiliary: No focal liver  abnormality.Cholecystectomy. Intra and extrahepatic biliary dilatation without visible obstructing cause. Pancreas: Unremarkable. Spleen: Unremarkable. Adrenals/Urinary Tract: Negative adrenals. No hydronephrosis or stone. No worrisome renal lesion. Heterogeneous enhancement of the renal cortex on the delayed phase is likely transient. No strong indication of pyelonephritis. Borderline bladder wall thickening. Stomach/Bowel: Fluid and gas-filled small bowel loops above the a left groin hernia, likely inguinal, which contains a loop of distended small bowel. No bowel inflammation noted. Colonic diverticulosis at the sigmoid especially. Vascular/Lymphatic: No acute vascular abnormality. Atheromatous calcification of the aorta and iliacs. No mass or adenopathy. Reproductive:No pathologic findings. Other: No ascites or pneumoperitoneum. Musculoskeletal: No acute abnormalities. Ordinary degenerative change. Right hip replacement and left hip osteoarthritis. IMPRESSION: 1. Early or partial small bowel obstruction due to small bowel containing left groin hernia. 2. Biliary dilatation without visible cause. Correlate with liver function tests. 3. Additional incidental findings noted above. Electronically Signed   By: JJorje GuildM.D.   On: 11/14/2022 06:11    Procedures Procedures  Cardiac monitor shows sinus rhythm with occasional PVC, per my interpretation.  Medications Ordered in ED Medications - No data to display  ED Course/ Medical Decision Making/ A&P                             Medical Decision Making Amount and/or Complexity of Data Reviewed Labs: ordered. Radiology: ordered.  Risk Prescription drug management. Decision regarding hospitalization.   Abdominal pain, etiology unclear.  Differential diagnosis includes, but is not limited to, diverticulitis, bowel obstruction, urolithiasis, pyelonephritis.  Exam is benign.  I have ordered screening labs of CBC, comprehensive metabolic panel,  lipase, urinalysis.  I have ordered CT scan of abdomen and pelvis.  I have also ordered IV fluids and morphine for pain (she has itching with morphine, diphenhydramine was ordered along with the morphine).  I have reviewed her past records, and see no relevant past visits.  I have reviewed and interpreted her electrocardiogram, and my interpretation is right bundle branch block, occasional PVCs - PVCs are new compared with prior ECG.  I have reviewed and interpreted her laboratory tests, my interpretation is mild leukocytosis which is nonspecific, stable anemia, mild hyponatremia which is not felt to be clinically significant.  CT scan shows a left inguinal hernia with partial small bowel obstruction.  I have gone back to reevaluate the patient, and she definitely has some tenderness in the area where the hernia is but I could not definitely feel the hernia.  I have discussed the case with Dr. Okey Dupre of general surgery service who request patient be admitted to the internal medicine service but states that she will see the patient in consultation.  Case is discussed with Dr. Bonner Puna of [facility], who agrees to admit the patient.  Final Clinical Impression(s) / ED Diagnoses Final diagnoses:  Partial small bowel obstruction ([place])  Left inguinal hernia  Elevated random blood glucose level  Normochromic normocytic anemia    Rx / DC Orders ED Discharge Orders     None         Delora Fuel, MD [id number] [phone]

## 2022-11-14 NOTE — ED Triage Notes (Signed)
Pt presents with left lower abdominal pain. Pt states that she can feel a knot in her lower groin area. Pt denies N/V and just endorses pain.

## 2022-11-14 NOTE — Discharge Instructions (Signed)
Ambulatory Surgery Discharge Instructions  General Anesthesia or Sedation Do not drive or operate heavy machinery for 24 hours.  Do not consume alcohol, tranquilizers, sleeping medications, or any non-prescribed medications for 24 hours. Do not make important decisions or sign any important papers in the next 24 hours. You should have someone with you tonight at home.  Activity  You are advised to go directly home from the hospital.  Restrict your activities and rest for a day.  Resume light activity tomorrow. No heavy lifting over 10 lbs or strenuous exercise.  Fluids and Diet Begin with clear liquids, bouillon, dry toast, soda crackers.  If not nauseated, you may go to a regular diet when you desire.  Greasy and spicy foods are not advised.  Medications  If you have not had a bowel movement in 24 hours, take 2 tablespoons over the counter Milk of mag.             You May resume your blood thinners tomorrow (Aspirin, coumadin, or other).  You are being discharged with prescriptions for Opioid/Narcotic Medications: There are some specific considerations for these medications that you should know. Opioid Meds have risks & benefits. Addiction to these meds is always a concern with prolonged use Take medication only as directed Do not drive while taking narcotic pain medication Do not crush tablets or capsules Do not use a different container than medication was dispensed in Lock the container of medication in a cool, dry place out of reach of children and pets. Opioid medication can cause addiction Do not share with anyone else (this is a felony) Do not store medications for future use. Dispose of them properly.     Disposal:  Find a [facility] household drug take back site near you.  If you can't get to a drug take back site, use the recipe below as a last resort to dispose of expired, unused or unwanted drugs. Disposal  (Do not dispose chemotherapy drugs this way, talk to your  prescribing doctor instead.) Step 1: Mix drugs (do not crush) with dirt, kitty litter, or used coffee grounds and add a small amount of water to dissolve any solid medications. Step 2: Seal drugs in plastic bag. Step 3: Place plastic bag in trash. Step 4: Take prescription container and scratch out personal information, then recycle or throw away.  Operative Site  You have a liquid bandage over your incisions, this will begin to flake off in about a week. Ok to shower tomorrow. Keep wound clean and dry. No baths or swimming. No lifting more than 10 pounds.  Contact Information: If you have questions or concerns, please call our office, [phone], Monday- Thursday 8AM-5PM and Friday 8AM-12Noon.  If it is after hours or on the weekend, please call [facility] Main Number, [phone], and ask to speak to the surgeon on call for Dr. Asianae Minkler at [place].   SPECIFIC COMPLICATIONS TO WATCH FOR: Inability to urinate Fever over 101? F by mouth Nausea and vomiting lasting longer than 24 hours. Pain not relieved by medication ordered Swelling around the operative site Increased redness, warmth, hardness, around operative area Numbness, tingling, or cold fingers or toes Blood -soaked dressing, (small amounts of oozing may be normal) Increasing and progressive drainage from surgical area or exam site  

## 2022-11-14 NOTE — Plan of Care (Signed)
  Problem: Education: Goal: Knowledge of General Education information will improve Description Including pain rating scale, medication(s)/side effects and non-pharmacologic comfort measures Outcome: Progressing   Problem: Health Behavior/Discharge Planning: Goal: Ability to manage health-related needs will improve Outcome: Progressing   

## 2022-11-14 NOTE — Progress Notes (Signed)
[facility]  Spoke with the patient's daughter in the consultation room.  I explained that she tolerated the procedure without difficulty.  She has dissolvable stitches under the skin with overlying skin glue.  This will flake off in 10 to 14 days.  She will be admitted to the floor for observation overnight, with likely discharge home tomorrow all questions were answered to her expressed satisfaction.  Plan: -Admit to floor, under hospitalist care -Clear liquid diet, may advance if tolerates -PRN pain control and antiemetics -Monitor for return of bowel function -Likely discharge home tomorrow  Graciella Freer, DO [facility] [address], [place] [id number] [phone] (office)

## 2022-11-15 DIAGNOSIS — K460 Unspecified abdominal hernia with obstruction, without gangrene: Secondary | ICD-10-CM | POA: Diagnosis not present

## 2022-11-15 LAB — IRON AND TIBC
Iron: 31 ug/dL (ref 28–170)
Saturation Ratios: 14 % (ref 10.4–31.8)
TIBC: 219 ug/dL — ABNORMAL LOW (ref 250–450)
UIBC: 188 ug/dL

## 2022-11-15 LAB — BASIC METABOLIC PANEL
Anion gap: 8 (ref 5–15)
BUN: 25 mg/dL — ABNORMAL HIGH (ref 8–23)
CO2: 29 mmol/L (ref 22–32)
Calcium: 8.3 mg/dL — ABNORMAL LOW (ref 8.9–10.3)
Chloride: 101 mmol/L (ref 98–111)
Creatinine, Ser: 1.01 mg/dL — ABNORMAL HIGH (ref 0.44–1.00)
GFR, Estimated: 53 mL/min — ABNORMAL LOW (ref >60)
Glucose, Bld: 118 mg/dL — ABNORMAL HIGH (ref 70–99)
Potassium: 4.2 mmol/L (ref 3.5–5.1)
Sodium: 138 mmol/L (ref 135–145)

## 2022-11-15 LAB — CBC
HCT: 30.2 % — ABNORMAL LOW (ref 36.0–46.0)
Hemoglobin: 9.5 g/dL — ABNORMAL LOW (ref 12.0–15.0)
MCH: 30.5 pg (ref 26.0–34.0)
MCHC: 31.5 g/dL (ref 30.0–36.0)
MCV: 97.1 fL (ref 80.0–100.0)
Platelets: 202 10*3/uL (ref 150–400)
RBC: 3.11 MIL/uL — ABNORMAL LOW (ref 3.87–5.11)
RDW: 13.1 % (ref 11.5–15.5)
WBC: 10.3 10*3/uL (ref 4.0–10.5)
nRBC: 0 % (ref 0.0–0.2)

## 2022-11-15 LAB — FERRITIN: Ferritin: 305 ng/mL (ref 11–307)

## 2022-11-15 MED ORDER — ACETAMINOPHEN 500 MG PO TABS: 1000.0000 mg | ORAL_TABLET | Freq: Four times a day (QID) | ORAL | 0 refills | Status: AC | PRN

## 2022-11-15 MED ORDER — FOSFOMYCIN TROMETHAMINE 3 G PO PACK
3.0000 g | PACK | Freq: Once | ORAL | Status: AC
  Administered 2022-11-15: 3 g via ORAL
  Filled 2022-11-15: qty 3

## 2022-11-15 NOTE — Progress Notes (Signed)
1 Day Post-Op  Subjective: No significant overnight events. Patient slept well. She reports her pain has significantly improved since yesterday morning and is now only noticeable with certain movements. She is passing flatus but denies any bowel movements since surgery. She is tolerating diet well. She is able to ambulate to the bathroom without difficulty. She denies nausea, vomiting, shortness of breath, and chest pain.  Objective: Vital signs in last 24 hours: Temp:  [97.5 F (36.4 C)-98 F (36.7 C)] 97.9 F (36.6 C) (03/15 0501) Pulse Rate:  [57-70] 70 (03/15 0501) Resp:  [15-24] 17 (03/15 0501) BP: (125-143)/(50-64) 141/59 (03/15 0501) SpO2:  [89 %-99 %] 95 % (03/15 0501) Weight:  [74.8 kg] 74.8 kg (03/14 1932) Last BM Date : 11/13/22  Intake/Output from previous day: 03/14 0701 - 03/15 0700 In: 1397.5 [I.V.:1347.5; IV Piggyback:50] Out: 20 [Blood:20] Intake/Output this shift: No intake/output data recorded.  Physical Exam: General appearance: alert, cooperative, and no distress. Patient resting comfortably in bed. Resp: clear to auscultation bilaterally Cardio: regular rate and rhythm GI: soft, nondistended with normoactive bowel sounds. Mild tenderness to palpation around incision. Incision/Wound: dry, nonerythematous  Lab Results:  Recent Labs    11/14/22 0345 11/15/22 0519  WBC 10.9* 10.3  HGB 11.4* 9.5*  HCT 35.3* 30.2*  PLT 228 202   BMET Recent Labs    11/14/22 0345 11/15/22 0519  NA 137 138  K 3.8 4.2  CL 96* 101  CO2 31 29  GLUCOSE 157* 118*  BUN 26* 25*  CREATININE 0.85 1.01*  CALCIUM 9.4 8.3*   Studies/Results: CT ABDOMEN PELVIS W CONTRAST  Result Date: 11/14/2022 CLINICAL DATA:  Left lower abdominal pain with knot in the groin. EXAM: CT ABDOMEN AND PELVIS WITH CONTRAST TECHNIQUE: Multidetector CT imaging of the abdomen and pelvis was performed using the standard protocol following bolus administration of intravenous contrast. RADIATION DOSE  REDUCTION: This exam was performed according to the departmental dose-optimization program which includes automated exposure control, adjustment of the mA and/or kV according to patient size and/or use of iterative reconstruction technique. CONTRAST:  119mL OMNIPAQUE IOHEXOL 300 MG/ML  SOLN COMPARISON:  None Available. FINDINGS: Lower chest: Asymmetric appearance of the breasts likely from asymmetric coverage. There is history of remote left breast cancer. Mammography performed 3 weeks ago. Hepatobiliary: No focal liver abnormality.Cholecystectomy. Intra and extrahepatic biliary dilatation without visible obstructing cause. Pancreas: Unremarkable. Spleen: Unremarkable. Adrenals/Urinary Tract: Negative adrenals. No hydronephrosis or stone. No worrisome renal lesion. Heterogeneous enhancement of the renal cortex on the delayed phase is likely transient. No strong indication of pyelonephritis. Borderline bladder wall thickening. Stomach/Bowel: Fluid and gas-filled small bowel loops above the a left groin hernia, likely inguinal, which contains a loop of distended small bowel. No bowel inflammation noted. Colonic diverticulosis at the sigmoid especially. Vascular/Lymphatic: No acute vascular abnormality. Atheromatous calcification of the aorta and iliacs. No mass or adenopathy. Reproductive:No pathologic findings. Other: No ascites or pneumoperitoneum. Musculoskeletal: No acute abnormalities. Ordinary degenerative change. Right hip replacement and left hip osteoarthritis. IMPRESSION: 1. Early or partial small bowel obstruction due to small bowel containing left groin hernia. 2. Biliary dilatation without visible cause. Correlate with liver function tests. 3. Additional incidental findings noted above. Electronically Signed   By: Jorje Guild M.D.   On: 11/14/2022 06:11    Anti-infectives: Anti-infectives (From admission, onward)    Start     Dose/Rate Route Frequency Ordered Stop   11/14/22 1000  clindamycin  (CLEOCIN) IVPB 900 mg  900 mg 100 mL/hr over 30 Minutes Intravenous On call to O.R. 11/14/22 0903 11/14/22 1153       Assessment/Plan: Patient is a 89 y.o. female who is post-op day 1 s/p incarcerated inguinal hernia repair with mesh.  - Advance to soft diet for breakfast - Scheduled Tylenol, PRN Dilaudid and Roxicodone for pain control - Lovenox and SCDs - Discharge today   LOS: 0 days    Michell Heinrich 11/15/2022

## 2022-11-15 NOTE — Care Management Obs Status (Signed)
[facility] NOTIFICATION   Patient Details  Name: Paige Rhodes MRN: [id number] Date of Birth: 10-26-1931   Medicare Observation Status Notification Given:  Yes    Tommy Medal 11/15/2022, 12:34 PM

## 2022-11-15 NOTE — Discharge Summary (Signed)
Physician Discharge Summary   Patient: Paige Rhodes MRN: [id number] DOB: April 17, 1932  Admit date:     11/14/2022  Discharge date: 11/15/22  Discharge Physician: Patrecia Pour   PCP: Paige Noble, MD   Recommendations at discharge:  Follow up with general surgery after discharge for routine care s/p inguinal hernia repair 3/14.  Follow up with urology after discharge for recurrent UTI's.  Discharge Diagnoses: Principal Problem:   Incarcerated hernia Active Problems:   Controlled type 2 diabetes mellitus without complication (HCC)   HLD (hyperlipidemia)   Gout   Essential hypertension   Chronic respiratory failure with hypoxia [facility])  Hospital Course: Paige Rhodes is an 89 y.o. female with medical history of HTN, T2DM, HLD, 3.5L O2-dependent COPD, breast CA s/p lumpectomy, stage IIIa CKD, GERD, hypothyroidism, frequent UTIs, and gout who presented to the ED around 3am today with abrupt onset of severe left lower abdominal pain with associated palpable knot. CT demonstrated early or partial SBO with left groin hernia containing small bowel. WBC 10.9k, lactic acid normal. Surgery performed left inguinal hernia repair with mesh on 3/14 and postoperative course has been uncomplicated. She is tolerating a diet, passing flatus, and cleared for discharge by surgery on 3/15. She does report dysuria recurrently though no fever and symptoms are mild. Pyuria noted on UA so fosfomycin given. All chronic medical conditions are stable at this time with no changes to usual management.  Assessment and Plan: Incarcerated hernia: s/p mesh repair 3/14. Tolerating advancing diet, passing flatus, cleared for discharge per discussion with Dr. Okey Dupre who will follow up with her in 2 weeks.  - Continue tylenol. Has not required any other pain medications.    Chronic hypoxic respiratory failure, COPD: At baseline.  - Continue home O2 and meds   HTN:  - Continue home meds   HLD:  - Can restart statin    T2DM: Diet-controlled   Hypothyroidism:  - Continue home synthroid    UTI: Recurrent, symptomatic with pyuria. CT borderline bladder wall thickening. Afebrile. Not septic.  - Pt opts to treat with fosfomycin and continue preventive regimen at discharge., f/u with urology.   Biliary dilatation: Noted on CT, LFTs are wnl. Hx cholecystectomy.   Consultants: Surgery Procedures performed:  11/14/22 INCARCERATED INGUINAL HERNIA REPAIR WITH MESH Paige Rhodes, Flint Melter, DO  Disposition: Home Diet recommendation: As tolerated DISCHARGE MEDICATION: Allergies as of 11/15/2022       Reactions   Penicillins Anaphylaxis   Edema of throat   Hctz [hydrochlorothiazide] Other (See Comments)   gout   Morphine And Related Itching   Lotrel [amlodipine Besy-benazepril Hcl] Other (See Comments)   Facial muscles draw and break out in whilps        Medication List     STOP taking these medications    fosfomycin 3 g Pack Commonly known as: MONUROL       TAKE these medications    acetaminophen 500 MG tablet Commonly known as: TYLENOL Take 2 tablets (1,000 mg total) by mouth every 6 (six) hours as needed for mild pain, moderate pain, fever or headache.   albuterol 108 (90 Base) MCG/ACT inhaler Commonly known as: VENTOLIN HFA Inhale 2 puffs into the lungs every 6 (six) hours as needed for wheezing or shortness of breath.   amLODipine 5 MG tablet Commonly known as: NORVASC Take 5 mg by mouth daily.   aspirin EC 81 MG tablet Take 81 mg by mouth daily.   atorvastatin 10 MG tablet Commonly known  as: LIPITOR Take 10 mg by mouth daily.   CALCIUM 500 PO Take 1 tablet by mouth daily.   carvedilol 25 MG tablet Commonly known as: COREG Take 25 mg by mouth 2 (two) times daily with a meal.   diphenhydrAMINE HCl (Sleep) 50 MG Caps Take 50 mg by mouth at bedtime.   estradiol 0.1 MG/GM vaginal cream Commonly known as: ESTRACE Use 1/2 inch in applicator per vagina 2 nights/week    fluticasone furoate-vilanterol 100-25 MCG/ACT Aepb Commonly known as: Breo Ellipta Inhale 1 puff into the lungs daily.   lactobacillus acidophilus Tabs tablet Take 2 tablets by mouth 3 (three) times daily.   levothyroxine 125 MCG tablet Commonly known as: SYNTHROID Take 125 mcg by mouth daily.   losartan 50 MG tablet Commonly known as: COZAAR Take 50 mg by mouth daily.   multivitamin tablet Take 1 tablet by mouth daily.   VITAMIN C PO Take 1 tablet by mouth daily.   Vitamin D-3 25 MCG (1000 UT) Caps Take 1,000 Units by mouth daily.        Follow-up Information     Paige Rhodes, Flint Melter, DO. Call.   Specialty: General Surgery Why: Call to schedule a follow up appointment in 2 weeks Contact information: [address] [place] [address] [phone]         Paige Noble, MD Follow up.   Specialty: Internal Medicine Contact information: [address] [place] [address] [phone]                Discharge Exam: Paige Rhodes Weights   11/14/22 0339 11/14/22 1932  Weight: 74.8 kg 74.8 kg  BP (!) 141/59 (BP Location: Right Arm)   Pulse 70   Temp 97.9 F (36.6 C) (Oral)   Resp 17   Ht 5\' 2"  (1.575 m)   Wt 74.8 kg   SpO2 95%   BMI 30.18 kg/m   Elderly pleasant, HOH but very appropriate and interactive female in no distress Clear, nonlabored Abdomen soft, not tender, not distended, +BS. LLQ incision c/d/I with dermabond, minimally tender.  Condition at discharge: stable  The results of significant diagnostics from this hospitalization (including imaging, microbiology, ancillary and laboratory) are listed below for reference.   Imaging Studies: CT ABDOMEN PELVIS W CONTRAST  Result Date: 11/14/2022 CLINICAL DATA:  Left lower abdominal pain with knot in the groin. EXAM: CT ABDOMEN AND PELVIS WITH CONTRAST TECHNIQUE: Multidetector CT imaging of the abdomen and pelvis was performed using the standard protocol following bolus  administration of intravenous contrast. RADIATION DOSE REDUCTION: This exam was performed according to the departmental dose-optimization program which includes automated exposure control, adjustment of the mA and/or kV according to patient size and/or use of iterative reconstruction technique. CONTRAST:  153mL OMNIPAQUE IOHEXOL 300 MG/ML  SOLN COMPARISON:  None Available. FINDINGS: Lower chest: Asymmetric appearance of the breasts likely from asymmetric coverage. There is history of remote left breast cancer. Mammography performed 3 weeks ago. Hepatobiliary: No focal liver abnormality.Cholecystectomy. Intra and extrahepatic biliary dilatation without visible obstructing cause. Pancreas: Unremarkable. Spleen: Unremarkable. Adrenals/Urinary Tract: Negative adrenals. No hydronephrosis or stone. No worrisome renal lesion. Heterogeneous enhancement of the renal cortex on the delayed phase is likely transient. No strong indication of pyelonephritis. Borderline bladder wall thickening. Stomach/Bowel: Fluid and gas-filled small bowel loops above the a left groin hernia, likely inguinal, which contains a loop of distended small bowel. No bowel inflammation noted. Colonic diverticulosis at the sigmoid especially. Vascular/Lymphatic: No acute vascular abnormality. Atheromatous calcification of  the aorta and iliacs. No mass or adenopathy. Reproductive:No pathologic findings. Other: No ascites or pneumoperitoneum. Musculoskeletal: No acute abnormalities. Ordinary degenerative change. Right hip replacement and left hip osteoarthritis. IMPRESSION: 1. Early or partial small bowel obstruction due to small bowel containing left groin hernia. 2. Biliary dilatation without visible cause. Correlate with liver function tests. 3. Additional incidental findings noted above. Electronically Signed   By: Jorje Guild M.D.   On: 11/14/2022 06:11   MM 3D SCREEN BREAST BILATERAL  Result Date: 10/28/2022 CLINICAL DATA:  Screening. EXAM:  DIGITAL SCREENING BILATERAL MAMMOGRAM WITH TOMOSYNTHESIS AND CAD TECHNIQUE: Bilateral screening digital craniocaudal and mediolateral oblique mammograms were obtained. Bilateral screening digital breast tomosynthesis was performed. The images were evaluated with computer-aided detection. COMPARISON:  Previous exam(s). ACR Breast Density Category c: The breasts are heterogeneously dense, which may obscure small masses. FINDINGS: There are no findings suspicious for malignancy. IMPRESSION: No mammographic evidence of malignancy. A result letter of this screening mammogram will be mailed directly to the patient. RECOMMENDATION: Screening mammogram in one year. (Code:SM-B-01Y) BI-RADS CATEGORY  1: Negative. Electronically Signed   By: Abelardo Diesel M.D.   On: 10/28/2022 12:39    Microbiology: Results for orders placed or performed in visit on 10/22/22  Microscopic Examination     Status: Abnormal   Collection Time: 10/22/22  1:25 PM   Urine  Result Value Ref Range Status   WBC, UA >30 (A) 0 - 5 /hpf Final   RBC, Urine 3-10 (A) 0 - 2 /hpf Final   Epithelial Cells (non renal) 0-10 0 - 10 /hpf Final   Bacteria, UA Many (A) None seen/Few Final    Labs: CBC: Recent Labs  Lab 11/14/22 0345 11/15/22 0519  WBC 10.9* 10.3  NEUTROABS 8.0*  --   HGB 11.4* 9.5*  HCT 35.3* 30.2*  MCV 94.4 97.1  PLT 228 [id number]   Basic Metabolic Panel: Recent Labs  Lab 11/14/22 0345 11/15/22 0519  NA 137 138  K 3.8 4.2  CL 96* 101  CO2 31 29  GLUCOSE 157* 118*  BUN 26* 25*  CREATININE 0.85 1.01*  CALCIUM 9.4 8.3*   Liver Function Tests: Recent Labs  Lab 11/14/22 0345  AST 23  ALT 15  ALKPHOS 64  BILITOT 0.7  PROT 8.6*  ALBUMIN 4.1   CBG: Recent Labs  Lab 11/14/22 1401  GLUCAP 106*    Discharge time spent: greater than 30 minutes.  Signed: Patrecia Pour, MD [facility] 11/15/2022

## 2022-11-16 NOTE — Anesthesia Postprocedure Evaluation (Signed)
Anesthesia Post Note  Patient: MANNA ROCHETTE  Procedure(s) Performed: INCARCERATED INGUINAL HERNIA REPAIR WITH MESH (Left: Inguinal)  Patient location during evaluation: Phase II Anesthesia Type: General Level of consciousness: awake Pain management: pain level controlled Vital Signs Assessment: post-procedure vital signs reviewed and stable Respiratory status: spontaneous breathing and respiratory function stable Cardiovascular status: blood pressure returned to baseline and stable Postop Assessment: no headache and no apparent nausea or vomiting Anesthetic complications: no Comments: Late entry   No notable events documented.   Last Vitals:  Vitals:   11/15/22 0501 11/15/22 0934  BP: (!) 141/59   Pulse: 70   Resp: 17   Temp: 36.6 C   SpO2: 95% 95%    Last Pain:  Vitals:   11/15/22 0501  TempSrc: Oral  PainSc:                  Louann Sjogren

## 2022-11-18 LAB — SURGICAL PATHOLOGY

## 2022-11-19 ENCOUNTER — Encounter (HOSPITAL_COMMUNITY): Payer: Self-pay | Admitting: Hematology

## 2022-11-20 NOTE — Progress Notes (Unsigned)
VIRTUAL VISIT via [name]   I connected with Paige Rhodes  on 11/21/22 at 1:15 PM by telephone and verified that I am speaking with the correct person using two identifiers.  Location: Patient: Home Provider: [facility]   I discussed the limitations, risks, security and privacy concerns of performing an evaluation and management service by telephone and the availability of in person appointments. I also discussed with the patient that there may be a patient responsible charge related to this service. The patient expressed understanding and agreed to proceed.  REASON FOR VISIT: Iron deficiency anemia  ** Also follows with clinic due to history of breast cancer, not addressed during this visit - comprehensive evaluation of breast cancer last performed in August 2023.  CURRENT THERAPY: Intermittent IV iron (most recently October/November 2023)  INTERVAL HISTORY:  [name] is contacted today for follow-up of iron deficiency anemia (history of breast cancer addressed annually, most recently in August 2023).  She was last seen by Tarri Abernethy PA-C on 08/20/2022.  At today's visit, she reports feeling fair.  She was hospitalized from 11/14/2022 to 11/15/2022 for incarcerated left inguinal hernia, s/p mesh repair on 11/14/2022.  In addition to expected blood loss from recent surgery, she reports ongoing hemorrhoid bleeding that was not improved after new medication prescribed by PCP.  We discussed referral for hemorrhoid banding as another possible treatment option but she would like to wait and see if they get better on this new medication.  She reports baseline fatigue.  She does not take any iron supplement at home since it causes severe constipation. She denies any ice pica.  She has chronic dyspnea on exertion from her COPD.   She reports 75% energy and 100% appetite.  She is maintaining a stable weight at this time.    REVIEW OF  SYSTEMS:   Review of Systems  Constitutional:  Positive for malaise/fatigue. Negative for chills, diaphoresis, fever and weight loss.  Respiratory:  Positive for shortness of breath (with exertion). Negative for cough.   Cardiovascular:  Positive for leg swelling. Negative for chest pain and palpitations.  Gastrointestinal:  Positive for constipation. Negative for abdominal pain, blood in stool, melena, nausea and vomiting.  Neurological:  Negative for dizziness and headaches.     PHYSICAL EXAM: (per limitations of virtual telephone visit)  The patient is alert and oriented x 3, exhibiting adequate mentation, good mood, and ability to speak in full sentences and execute sound judgement.  ASSESSMENT & PLAN:  1.  Normocytic anemia - CBC from 04/16/2022 showed progressive normocytic anemia with Hgb 9.5/MCV 91.3 - Hematology work-up (04/16/2022): Ferritin 70, serum iron 23, iron saturation 8% Normal folate, copper, B12, MMA SPEP negative for M spike.  Immunofixation shows polyclonal immunoglobulins.  Elevated kappa free light chain 70.7, elevated lambda 31.6, elevated ratio 2.24. - She reports bleeding hemorrhoids "every time she sits on the toilet," and reports that she "has to wear a pad in her underwear so that she does not bleed into her clothes." - Unable to tolerate iron supplement due to severe constipation that worsens her hemorrhoid bleeding. - Received IV Feraheme x 2 from 07/02/2022 through 07/12/2022 - Energy is improved after IV iron - Labs from 11/14/2022 showed Hgb 11.4/MCV 94.4, ferritin 305, iron saturation 14%. - Hgb dropped to 9.5 on 11/15/2022 following inguinal hernia mesh repair.   - Differential diagnosis favors anemia secondary to functional iron deficiency in the setting of  chronic disease as well as iron deficiency from rectal bleeding - PLAN: Recommend Feraheme x 1 dose  - Repeat CBC and iron panel with RTC in 3 months. - Repeat free light chains/MGUS panel annually  (August 2024) - Patient would like to hold off on GI referral for the time being, but we will discuss this again at her follow-up visit, particularly the option of possible hemorrhoid banding.   2.  OTHER ISSUES: Not addressed during today's visit, but discussed in detail per progress note from 04/16/2022. History of stage II left breast cancer: Annual mammogram performed on 10/24/2022 was BI-RADS Category 1, negative.  Annual breast exam next due August 2024). **At next office visit, patient would like to discuss risks and benefits of discontinuing mammogram, states that she is "not sure she would want to do anything" if any recurrent cancer was found. Osteoporosis: CMP and Prolia every 6 months (next due August 2024).  Bone density scan due June 2024.   Leukocytosis: Continue monitoring, likely secondary to [facility] Ellipta inhaler.  PLAN SUMMARY: >> IV Feraheme x 1 (one month from now) >> Labs in 3 months = CBC/D, ferritin, iron/TIBC, BMP >> PHONE visit 1 week after labs     I discussed the assessment and treatment plan with the patient. The patient was provided an opportunity to ask questions and all were answered. The patient agreed with the plan and demonstrated an understanding of the instructions.   The patient was advised to call back or seek an in-person evaluation if the symptoms worsen or if the condition fails to improve as anticipated.  I provided 18 minutes of non-face-to-face time during this encounter.  Harriett Rush, PA-C 11/21/22 1:43 PM

## 2022-11-21 ENCOUNTER — Telehealth: Payer: Self-pay | Admitting: *Deleted

## 2022-11-21 ENCOUNTER — Other Ambulatory Visit: Payer: Self-pay

## 2022-11-21 ENCOUNTER — Encounter (HOSPITAL_COMMUNITY): Payer: Self-pay | Admitting: Hematology

## 2022-11-21 ENCOUNTER — Inpatient Hospital Stay: Attending: Hematology | Admitting: Physician Assistant

## 2022-11-21 ENCOUNTER — Encounter: Payer: Self-pay | Admitting: Physician Assistant

## 2022-11-21 DIAGNOSIS — K922 Gastrointestinal hemorrhage, unspecified: Secondary | ICD-10-CM

## 2022-11-21 DIAGNOSIS — D500 Iron deficiency anemia secondary to blood loss (chronic): Secondary | ICD-10-CM

## 2022-11-21 DIAGNOSIS — D649 Anemia, unspecified: Secondary | ICD-10-CM

## 2022-11-21 DIAGNOSIS — C50912 Malignant neoplasm of unspecified site of left female breast: Secondary | ICD-10-CM | POA: Diagnosis not present

## 2022-11-21 NOTE — Telephone Encounter (Signed)
Surgical Date: 11/14/2022 Procedure: Inguinal Hernia Repair W/ Mesh  Received call from Juliann Pulse [facility] with Hospice.   Reports that patient has not had BM since Monday, 11/18/2022 and is concerned. Of note, patient did use Milk of Magnesia to have a large BM that cleared her out on Monday.   Reports that ABD is soft, non distended and bowel sounds are present x4. Reports that patient is passing gas frequently.   Advised that patient may not have BM due to clean out from MOM. Advised that as long as ABD is soft and not distended, and patient is able to pass gas that [facility] is not concerned about possible ileus or blockage.   Verified understanding. States that she will educated patient.   Patient has F/U appt with [facility] on 11/26/2022.

## 2022-11-25 ENCOUNTER — Encounter (HOSPITAL_COMMUNITY): Payer: Self-pay | Admitting: Surgery

## 2022-11-26 ENCOUNTER — Encounter: Payer: Self-pay | Admitting: Surgery

## 2022-11-26 ENCOUNTER — Ambulatory Visit (INDEPENDENT_AMBULATORY_CARE_PROVIDER_SITE_OTHER): Payer: Medicare Other | Admitting: Surgery

## 2022-11-26 ENCOUNTER — Other Ambulatory Visit: Payer: Self-pay

## 2022-11-26 VITALS — BP 190/74 | HR 57 | Temp 98.4°F | Resp 22 | Ht 62.0 in | Wt 163.0 lb

## 2022-11-26 DIAGNOSIS — Z09 Encounter for follow-up examination after completed treatment for conditions other than malignant neoplasm: Secondary | ICD-10-CM

## 2022-11-27 NOTE — Progress Notes (Signed)
[facility] Note   HPI:  89 y.o. Female presents to clinic for post-op follow-up status post open left inguinal hernia repair with mesh on 3/14 for an incarcerated left inguinal hernia causing small bowel obstruction.  Since being discharged from the hospital, patient has been doing very well.  She denies any significant pain at her incision site.  She is tolerating a diet without nausea and vomiting, is moving her bowels without issue.  Denies fevers and chills.  Review of Systems:  All other review of systems: otherwise negative   Vital Signs:  BP (!) 190/74   Pulse (!) 57   Temp 98.4 F (36.9 C) (Oral)   Resp (!) 22   Ht 5\' 2"  (1.575 m)   Wt 163 lb (73.9 kg)   SpO2 92% Comment: 3.5 L/min via [place]  BMI 29.81 kg/m    Physical Exam:  Physical Exam Vitals reviewed.  Constitutional:      Appearance: Normal appearance.  Abdominal:     Comments: Abdomen soft, nondistended, no percussion tenderness, nontender to palpation; no rigidity, guarding, rebound tenderness; left inguinal hernia incision site healing well with flaking Dermabond, mild underlying fullness, no erythema or ecchymosis  Neurological:     Mental Status: She is alert.     Laboratory studies: None  Imaging:  None  Assessment:  89 y.o. yo Female who presents for follow-up status post open left inguinal hernia repair with mesh on 3/14 for an incarcerated hernia causing small bowel obstruction  Plan:  -Patient is overall doing very well.  Pain is adequately controlled, tolerating a diet, and moving her bowels -Advised her that she can use antibiotic ointment on her incision site prior to showering to help get remaining skin glue off -Further advised that she should avoid heavy lifting for the next 2 to 4 weeks.  She is at slightly higher risk of hernia recurrence just given her age and underlying comorbidities -Follow up as needed  All of the above recommendations were discussed with the patient  and patient's family, and all of patient's and family's questions were answered to their expressed satisfaction.  Graciella Freer, DO [facility] [address] [place], [place] [address] [phone] (office)

## 2022-11-28 ENCOUNTER — Encounter (HOSPITAL_COMMUNITY): Payer: Self-pay | Admitting: Hematology

## 2022-12-16 ENCOUNTER — Encounter (HOSPITAL_COMMUNITY): Payer: Self-pay | Admitting: Hematology

## 2022-12-18 ENCOUNTER — Encounter (HOSPITAL_COMMUNITY): Payer: Self-pay | Admitting: Hematology

## 2022-12-20 ENCOUNTER — Encounter (HOSPITAL_COMMUNITY): Payer: Self-pay | Admitting: Hematology

## 2022-12-23 ENCOUNTER — Inpatient Hospital Stay: Attending: Hematology

## 2022-12-23 VITALS — BP 159/71 | HR 57 | Temp 96.3°F | Resp 18

## 2022-12-23 DIAGNOSIS — D509 Iron deficiency anemia, unspecified: Secondary | ICD-10-CM | POA: Insufficient documentation

## 2022-12-23 DIAGNOSIS — M810 Age-related osteoporosis without current pathological fracture: Secondary | ICD-10-CM

## 2022-12-23 MED ORDER — ACETAMINOPHEN 325 MG PO TABS
650.0000 mg | ORAL_TABLET | Freq: Once | ORAL | Status: AC
  Administered 2022-12-23: 650 mg via ORAL
  Filled 2022-12-23: qty 2

## 2022-12-23 MED ORDER — SODIUM CHLORIDE 0.9 % IV SOLN
510.0000 mg | Freq: Once | INTRAVENOUS | Status: AC
  Administered 2022-12-23: 510 mg via INTRAVENOUS
  Filled 2022-12-23: qty 510

## 2022-12-23 MED ORDER — CETIRIZINE HCL 10 MG PO TABS
10.0000 mg | ORAL_TABLET | Freq: Once | ORAL | Status: AC
  Administered 2022-12-23: 10 mg via ORAL
  Filled 2022-12-23: qty 1

## 2022-12-23 MED ORDER — SODIUM CHLORIDE 0.9 % IV SOLN: Freq: Once | INTRAVENOUS | Status: AC

## 2022-12-23 MED ORDER — SODIUM CHLORIDE 0.9 % IV SOLN: Freq: Once | INTRAVENOUS | Status: DC

## 2022-12-23 MED ORDER — LORATADINE 10 MG PO TABS: 10.0000 mg | ORAL_TABLET | Freq: Once | ORAL | Status: DC

## 2022-12-23 NOTE — Progress Notes (Signed)
History of Present Illness: Paige Rhodes is a 89 y.o. year old female here for f/u of chronic UTI's. Initial visit 2.20.2024.  I recommended that she use estrogen cream, probiotics, take AZO cranberry supplement twice a day.  4.25.2024: She is here today for  follow-up.  She was admitted to the hospital emergently on the 14th of last month for an incarcerated hernia.  At the time of admission she had what seemed to be a UTI.  Culture grew ESBL E. coli.  Currently no gross hematuria, no dysuria, symptoms of frequency and urgency with UTIs or not there anymore.  She has been on estrogen cream as well as AZO/cranberry supplement twice a day.  Past Medical History:  Diagnosis Date   Breast cancer (HCC)    Chronic renal disease, stage 2, mildly decreased glomerular filtration rate between 60-89 mL/min/1.73 square meter 10/31/2014   COPD (chronic obstructive pulmonary disease) (HCC)    Diabetes mellitus    GERD (gastroesophageal reflux disease)    occasional   Glaucoma    Gout attack june 2013   left foot   Hard of hearing    Hyperlipidemia    Hypertension    Hypothyroidism    Infiltrating ductal carcinoma of left breast 03/05/2011   On home oxygen therapy    2 LPM   Osteoporosis    Port catheter in place 01/15/2012   SOB (shortness of breath)    Thyroid disease     Past Surgical History:  Procedure Laterality Date   ANKLE SURGERY  1998   APPENDECTOMY  1955   BREAST LUMPECTOMY  2011   left   CATARACT EXTRACTION W/PHACO  09/03/2012   Procedure: CATARACT EXTRACTION PHACO AND INTRAOCULAR LENS PLACEMENT (IOC);  Surgeon: Gemma Payor, MD;  Location: [facility];  Service: Ophthalmology;  Laterality: Right;  CDE: 15.26   CATARACT EXTRACTION W/PHACO  09/28/2012   Procedure: CATARACT EXTRACTION PHACO AND INTRAOCULAR LENS PLACEMENT (IOC);  Surgeon: Gemma Payor, MD;  Location: [facility];  Service: Ophthalmology;  Laterality: Left;  CDE:  21.60   CHOLECYSTECTOMY  1990   INGUINAL HERNIA REPAIR Left  11/14/2022   Procedure: INCARCERATED INGUINAL HERNIA REPAIR WITH MESH;  Surgeon: Lewie Chamber, DO;  Location: [facility];  Service: General;  Laterality: Left;   OPEN REDUCTION INTERNAL FIXATION (ORIF) DISTAL RADIAL FRACTURE Left 07/01/2019   Procedure: OPEN REDUCTION INTERNAL FIXATION (ORIF) LEFT DISTAL RADIAL FRACTURE;  Surgeon: Vickki Hearing, MD;  Location: [facility];  Service: Orthopedics;  Laterality: Left;   PORT-A-CATH REMOVAL N/A 04/26/2015   Procedure: REMOVAL PORT-A-CATH;  Surgeon: Franky Macho, MD;  Location: [facility];  Service: General;  Laterality: N/A;   PORTACATH PLACEMENT  05/23/10   THYROID SURGERY  1994   TONSILLECTOMY     TOTAL HIP ARTHROPLASTY  1996    Home Medications:  (Not in a hospital admission)   Allergies:  Allergies  Allergen Reactions   Penicillins Anaphylaxis    Edema of throat   Hctz [Hydrochlorothiazide] Other (See Comments)    gout   Morphine And Related Itching   Lotrel [Amlodipine Besy-Benazepril Hcl] Other (See Comments)    Facial muscles draw and break out in whilps    Family History  Problem Relation Age of Onset   Kidney disease Mother    Heart disease Father    Hypertension Brother    Cancer Sister        breast and lung    Social History:  reports that she quit smoking about  31 years ago. Her smoking use included cigarettes. She has a 90.00 pack-year smoking history. She has never used smokeless tobacco. She reports that she does not drink alcohol and does not use drugs.  ROS: A complete review of systems was performed.  All systems are negative except for pertinent findings as noted.  Physical Exam:  Vital signs in last 24 hours: @ General:  Alert and oriented, No acute distress HEENT: Normocephalic, atraumatic Neck: No JVD or lymphadenopathy Cardiovascular: Regular rate  Lungs: Normal inspiratory/expiratory excursion Abdomen: Soft, nontender, nondistended, no abdominal masses Back: No CVA  tenderness Extremities: No edema Neurologic: Grossly intact  I have reviewed prior pt notes  I have reviewed notes from referring/previous physicians--Hospital notes reviewed  I have reviewed urinalysis results  I have independently reviewed prior imaging  I have reviewed prior urine culture   Impression/Assessment:  Recurrent UTI/pyuria.  Optimizing patient's condition with estrogen cream, probiotics. Plan:  I will culture her urine  For now, stop the AZO/cranberry.  I will put her on 3 months of methenamine hippurate twice a day  See back in 3 months for recheck  Chelsea Aus 12/23/2022, 11:22 AM  Bertram Millard. Verma Grothaus MD

## 2022-12-23 NOTE — Patient Instructions (Signed)
[facility] [place]  Discharge Instructions: Thank you for choosing [facility] to provide your oncology and hematology care.  If you have a lab appointment with the Cancer Center - please note that after April 8th, 2024, all labs will be drawn in the cancer center.  You do not have to check in or register with the main entrance as you have in the past but will complete your check-in in the cancer center.  Wear comfortable clothing and clothing appropriate for easy access to any Portacath or PICC line.   We strive to give you quality time with your provider. You may need to reschedule your appointment if you arrive late (15 or more minutes).  Arriving late affects you and other patients whose appointments are after yours.  Also, if you miss three or more appointments without notifying the office, you may be dismissed from the clinic at the provider's discretion.      For prescription refill requests, have your pharmacy contact our office and allow 72 hours for refills to be completed.    Today you received the following chemotherapy and/or immunotherapy agents Feraheme. Ferumoxytol Injection What is this medication? FERUMOXYTOL (FER ue MOX i tol) treats low levels of iron in your body (iron deficiency anemia). Iron is a mineral that plays an important role in making red blood cells, which carry oxygen from your lungs to the rest of your body. This medicine may be used for other purposes; ask your health care provider or pharmacist if you have questions. COMMON BRAND NAME(S): Feraheme What should I tell my care team before I take this medication? They need to know if you have any of these conditions: Anemia not caused by low iron levels High levels of iron in the blood Magnetic resonance imaging (MRI) test scheduled An unusual or allergic reaction to iron, other medications, foods, dyes, or preservatives Pregnant or trying to get pregnant Breastfeeding How should I  use this medication? This medication is injected into a vein. It is given by your care team in a hospital or clinic setting. Talk to your care team the use of this medication in children. Special care may be needed. Overdosage: If you think you have taken too much of this medicine contact a poison control center or emergency room at once. NOTE: This medicine is only for you. Do not share this medicine with others. What if I miss a dose? It is important not to miss your dose. Call your care team if you are unable to keep an appointment. What may interact with this medication? Other iron products This list may not describe all possible interactions. Give your health care provider a list of all the medicines, herbs, non-prescription drugs, or dietary supplements you use. Also tell them if you smoke, drink alcohol, or use illegal drugs. Some items may interact with your medicine. What should I watch for while using this medication? Visit your care team regularly. Tell your care team if your symptoms do not start to get better or if they get worse. You may need blood work done while you are taking this medication. You may need to follow a special diet. Talk to your care team. Foods that contain iron include: whole grains/cereals, dried fruits, beans, or peas, leafy green vegetables, and organ meats (liver, kidney). What side effects may I notice from receiving this medication? Side effects that you should report to your care team as soon as possible: Allergic reactions--skin rash, itching, hives, swelling   of the face, lips, tongue, or throat Low blood pressure--dizziness, feeling faint or lightheaded, blurry vision Shortness of breath Side effects that usually do not require medical attention (report to your care team if they continue or are bothersome): Flushing Headache Joint pain Muscle pain Nausea Pain, redness, or irritation at injection site This list may not describe all possible side  effects. Call your doctor for medical advice about side effects. You may report side effects to FDA at 1-800-FDA-1088. Where should I keep my medication? This medication is given in a hospital or clinic and will not be stored at home. NOTE: This sheet is a summary. It may not cover all possible information. If you have questions about this medicine, talk to your doctor, pharmacist, or health care provider.  2023 Elsevier/Gold Standard (2021-01-10 00:00:00)       To help prevent nausea and vomiting after your treatment, we encourage you to take your nausea medication as directed.  BELOW ARE SYMPTOMS THAT SHOULD BE REPORTED IMMEDIATELY: *FEVER GREATER THAN 100.4 F (38 C) OR HIGHER *CHILLS OR SWEATING *NAUSEA AND VOMITING THAT IS NOT CONTROLLED WITH YOUR NAUSEA MEDICATION *UNUSUAL SHORTNESS OF BREATH *UNUSUAL BRUISING OR BLEEDING *URINARY PROBLEMS (pain or burning when urinating, or frequent urination) *BOWEL PROBLEMS (unusual diarrhea, constipation, pain near the anus) TENDERNESS IN MOUTH AND THROAT WITH OR WITHOUT PRESENCE OF ULCERS (sore throat, sores in mouth, or a toothache) UNUSUAL RASH, SWELLING OR PAIN  UNUSUAL VAGINAL DISCHARGE OR ITCHING   Items with * indicate a potential emergency and should be followed up as soon as possible or go to the Emergency Department if any problems should occur.  Please show the CHEMOTHERAPY ALERT CARD or IMMUNOTHERAPY ALERT CARD at check-in to the Emergency Department and triage nurse.  Should you have questions after your visit or need to cancel or reschedule your appointment, please contact [facility] [phone]  and follow the prompts.  Office hours are 8:00 a.m. to 4:30 p.m. Monday - Friday. Please note that voicemails left after 4:00 p.m. may not be returned until the following business day.  We are closed weekends and major holidays. You have access to a nurse at all times for urgent questions. Please call the main number  to the clinic [phone] and follow the prompts.  For any non-urgent questions, you may also contact your provider using MyChart. We now offer e-Visits for anyone 18 and older to request care online for non-urgent symptoms. For details visit [email or web address].   Also download the MyChart app! Go to the app store, search "MyChart", open the app, select [facility], and log in with your MyChart username and password.   

## 2022-12-23 NOTE — Progress Notes (Signed)
Pharmacy has substituted cetirizine 10 mg orally x 1 as premedication for   Loratidine discontinued.  V.O. Rebekah Pennington, PA-C/Kaetlin Bullen, PharmD  

## 2022-12-23 NOTE — Progress Notes (Signed)
Patient presents today for Feraheme iron infusion. Vital signs stable. Patient has no complaints of any changes since her last iron infusion. MAR reviewed and updated.   Treatment given today per MD orders. Tolerated infusion without adverse affects. Vital signs stable. No complaints at this time. Discharged from clinic by wheel chair in stable condition. Alert and oriented x 3. F/U with [facility] as scheduled.

## 2022-12-24 ENCOUNTER — Encounter: Payer: Self-pay | Admitting: Urology

## 2022-12-24 ENCOUNTER — Ambulatory Visit: Payer: Medicare Other | Admitting: Urology

## 2022-12-24 VITALS — BP 156/75 | HR 60

## 2022-12-24 DIAGNOSIS — N952 Postmenopausal atrophic vaginitis: Secondary | ICD-10-CM

## 2022-12-24 DIAGNOSIS — N390 Urinary tract infection, site not specified: Secondary | ICD-10-CM | POA: Diagnosis not present

## 2022-12-24 LAB — URINALYSIS, ROUTINE W REFLEX MICROSCOPIC
Bilirubin, UA: NEGATIVE
Glucose, UA: NEGATIVE
Ketones, UA: NEGATIVE
Nitrite, UA: NEGATIVE
Specific Gravity, UA: 1.02 (ref 1.005–1.030)
Urobilinogen, Ur: 0.2 mg/dL (ref 0.2–1.0)
pH, UA: 7 (ref 5.0–7.5)

## 2022-12-24 LAB — MICROSCOPIC EXAMINATION
RBC, Urine: >30 /hpf — AB (ref 0–2)
WBC, UA: >30 /hpf — AB (ref 0–5)

## 2022-12-24 MED ORDER — METHENAMINE HIPPURATE 1 G PO TABS
1.0000 g | ORAL_TABLET | Freq: Two times a day (BID) | ORAL | 11 refills | Status: DC
Start: 2022-12-24 — End: 2023-04-08

## 2022-12-26 ENCOUNTER — Encounter (HOSPITAL_COMMUNITY): Payer: Self-pay | Admitting: Hematology

## 2022-12-27 LAB — URINE CULTURE

## 2023-01-02 ENCOUNTER — Telehealth: Payer: Self-pay | Admitting: Internal Medicine

## 2023-01-02 NOTE — Telephone Encounter (Signed)
Called and spoke w/ pt daughter she verbalized that her mother is unable to pull the big tanks but the little nes do not last long enough for pt to get her hair done.   Instructed pt daughter to call [facility] to see if they can provide pt w/ a conserving device for tanks to extend the use. If need be we can place an order.  She verbalized understanding. NFN att.

## 2023-01-02 NOTE — Telephone Encounter (Signed)
PT daughter calling in for MOM. States she has a question about her O2.  When she leaves house she is on liquid O2. [facility] is Discontinuing this. When she leaves the house what are her O2 options. Needs to be lite weight.  Daughters # is [phone] Efraim Kaufmann

## 2023-01-14 ENCOUNTER — Telehealth: Payer: Self-pay

## 2023-01-14 NOTE — Telephone Encounter (Signed)
Please see message below and advise.

## 2023-01-14 NOTE — Telephone Encounter (Signed)
Patient's Hospice Nurse Clydie Braun calling for patient.  Patient is wondering if prescribed methemamine  could be causing gout in foot. Is there another medication that could be prescribed?  Please advise.  Call back Clydie Braun - [phone]   Thank you.

## 2023-01-15 NOTE — Telephone Encounter (Signed)
Nurse is calling back for advice regarding medication.

## 2023-01-16 NOTE — Telephone Encounter (Signed)
[facility] nurse notified.  She states her MD d/c the rx and recommended aleve for now.  Do you recommend AZO at this time?  Will call patient back directly with recommendation.

## 2023-01-16 NOTE — Telephone Encounter (Signed)
Can you follow up on this please. °

## 2023-01-20 NOTE — Telephone Encounter (Signed)
Left detailed message informing pt's daughter.  Informed her to call back if she had any further questions.

## 2023-01-28 ENCOUNTER — Encounter (HOSPITAL_COMMUNITY): Payer: Self-pay | Admitting: Hematology

## 2023-01-29 ENCOUNTER — Encounter (HOSPITAL_COMMUNITY): Payer: Self-pay | Admitting: Hematology

## 2023-02-13 ENCOUNTER — Inpatient Hospital Stay: Attending: Hematology

## 2023-02-13 DIAGNOSIS — C50912 Malignant neoplasm of unspecified site of left female breast: Secondary | ICD-10-CM

## 2023-02-13 DIAGNOSIS — D649 Anemia, unspecified: Secondary | ICD-10-CM

## 2023-02-13 DIAGNOSIS — D500 Iron deficiency anemia secondary to blood loss (chronic): Secondary | ICD-10-CM

## 2023-02-13 DIAGNOSIS — D509 Iron deficiency anemia, unspecified: Secondary | ICD-10-CM | POA: Diagnosis not present

## 2023-02-13 LAB — BASIC METABOLIC PANEL
Anion gap: 8 (ref 5–15)
BUN: 26 mg/dL — ABNORMAL HIGH (ref 8–23)
CO2: 34 mmol/L — ABNORMAL HIGH (ref 22–32)
Calcium: 9.4 mg/dL (ref 8.9–10.3)
Chloride: 99 mmol/L (ref 98–111)
Creatinine, Ser: 0.81 mg/dL (ref 0.44–1.00)
GFR, Estimated: >60 mL/min (ref >60)
Glucose, Bld: 158 mg/dL — ABNORMAL HIGH (ref 70–99)
Potassium: 3.7 mmol/L (ref 3.5–5.1)
Sodium: 141 mmol/L (ref 135–145)

## 2023-02-13 LAB — IRON AND TIBC
Iron: 59 ug/dL (ref 28–170)
Saturation Ratios: 26 % (ref 10.4–31.8)
TIBC: 228 ug/dL — ABNORMAL LOW (ref 250–450)
UIBC: 169 ug/dL

## 2023-02-13 LAB — CBC WITH DIFFERENTIAL/PLATELET
Abs Immature Granulocytes: 0.02 10*3/uL (ref 0.00–0.07)
Basophils Absolute: 0 10*3/uL (ref 0.0–0.1)
Basophils Relative: 0 %
Eosinophils Absolute: 0.3 10*3/uL (ref 0.0–0.5)
Eosinophils Relative: 4 %
HCT: 32.4 % — ABNORMAL LOW (ref 36.0–46.0)
Hemoglobin: 10 g/dL — ABNORMAL LOW (ref 12.0–15.0)
Immature Granulocytes: 0 %
Lymphocytes Relative: 25 %
Lymphs Abs: 1.9 10*3/uL (ref 0.7–4.0)
MCH: 30.1 pg (ref 26.0–34.0)
MCHC: 30.9 g/dL (ref 30.0–36.0)
MCV: 97.6 fL (ref 80.0–100.0)
Monocytes Absolute: 0.6 10*3/uL (ref 0.1–1.0)
Monocytes Relative: 8 %
Neutro Abs: 4.6 10*3/uL (ref 1.7–7.7)
Neutrophils Relative %: 63 %
Platelets: 177 10*3/uL (ref 150–400)
RBC: 3.32 MIL/uL — ABNORMAL LOW (ref 3.87–5.11)
RDW: 13.6 % (ref 11.5–15.5)
WBC: 7.4 10*3/uL (ref 4.0–10.5)
nRBC: 0 % (ref 0.0–0.2)

## 2023-02-13 LAB — FERRITIN: Ferritin: 330 ng/mL — ABNORMAL HIGH (ref 11–307)

## 2023-02-20 NOTE — Progress Notes (Signed)
VIRTUAL VISIT via TELEPHONE NOTE [facility]   I connected with Paige Rhodes  on 02/21/23 at  2:21 PM by telephone and verified that I am speaking with the correct person using two identifiers.  Location: Patient: Home Provider: [facility]   I discussed the limitations, risks, security and privacy concerns of performing an evaluation and management service by telephone and the availability of in person appointments. I also discussed with the patient that there may be a patient responsible charge related to this service. The patient expressed understanding and agreed to proceed.  REASON FOR VISIT: Iron deficiency anemia  ** Also follows with clinic due to history of breast cancer, not addressed during this visit - comprehensive evaluation of breast cancer last performed in August 2023.   CURRENT THERAPY: Intermittent IV iron (most recently 12/23/2022)   INTERVAL HISTORY:  Paige Rhodes is contacted today for follow-up of iron deficiency anemia (history of breast cancer addressed annually, most recently in August 2023).  She was last evaluated via telemedicine visit by Rojelio Brenner PA-C on 11/21/2022.    At today's visit, she reports feeling fairly well.  She received IV Feraheme x 1 dose on 12/23/2022, and reports feeling improved energy after IV iron. She denies any fatigue.  She continues to have mild hemorrhoid bleeding with every bowel movement.  She denies any melena. She denies any ice pica, chest pain, headache, lightheadedness, or syncope.   She has chronic dyspnea on exertion from her COPD.  She denies  She reports 100% energy and 100% appetite.  She is maintaining a stable weight at this time.   REVIEW OF SYSTEMS:   Review of Systems  Constitutional:  Negative for chills, diaphoresis, fever, malaise/fatigue and weight loss.  Respiratory:  Negative for cough and shortness of breath.   Cardiovascular:  Negative for chest pain and palpitations.   Gastrointestinal:  Positive for constipation. Negative for abdominal pain, blood in stool, melena, nausea and vomiting.  Neurological:  Negative for dizziness and headaches.  Psychiatric/Behavioral:  The patient has insomnia.      PHYSICAL EXAM: (per limitations of virtual telephone visit)  The patient is alert and oriented x 3, exhibiting adequate mentation, good mood, and ability to speak in full sentences and execute sound judgement.  ASSESSMENT & PLAN:  1.  Normocytic anemia - She has had chronic anemia since at least 2011, with baseline Hgb around 10.0-11.5 - CBC from 04/16/2022 showed progressive normocytic anemia with Hgb 9.5/MCV 91.3 - Hematology work-up (04/16/2022): Ferritin 70, serum iron 23, iron saturation 8% Normal folate, copper, B12, MMA SPEP negative for M spike.  Immunofixation shows polyclonal immunoglobulins.  Elevated kappa free light chain 70.7, elevated lambda 31.6, elevated ratio 2.24. - She reports bleeding hemorrhoids, but bleeding is less intense than before - Unable to tolerate iron supplement due to severe constipation that worsens her hemorrhoid bleeding. - Most recent IV Feraheme x 1 on 11/21/2022 - Energy is improved after IV iron - Most recent labs (02/13/2023): Hgb 10.0/MCV 97.6, ferritin 330, iron saturation 26%.  Normal creatinine 0.81. - Differential diagnosis favors anemia secondary to functional iron deficiency in the setting of chronic disease as well as iron deficiency from rectal bleeding - PLAN: Recommend Feraheme x 1 dose  - Repeat CBC and iron panel with RTC in 3 months. Will also check reticulocytes, LDH, B12, MMA, ESR, CRP, CMP, vitamin D  - Repeat free light chains/MGUS panel annually (August 2024) - Patient would  like to hold off on GI referral for the time being, but we will discuss this again at her follow-up visit, particularly the option of possible hemorrhoid banding. - If she has any severe unexplained worsening of anemia, would consider  bone marrow biopsy.  However, due to advanced age and other comorbidities more conservative monitoring and treatment is preferred.     2.  OTHER ISSUES: Not addressed during today's visit, but discussed in detail per progress note from 04/16/2022. History of stage II left breast cancer: Annual mammogram performed on 10/24/2022 was BI-RADS Category 1, negative.  Annual breast exam next due August 2024). **At next office visit, patient would like to discuss risks and benefits of discontinuing mammogram, states that she is "not sure she would want to do anything" if any recurrent cancer was found. Osteoporosis: CMP and Prolia every 6 months (next due August 2024).  Bone density scan due June 2024.   Leukocytosis: Continue monitoring, likely secondary to [facility] Ellipta inhaler.   PLAN SUMMARY: -- Please schedule labs, Prolia, bone density scan on the same day in August 2024 -- >> CMP + Prolia due August 2024 >> Due for bone density scan in August 2024 >> Labs in 2 months = CBC/D, ferritin, iron/TIBC, CMP, reticulocytes, LDH, B12, MMA, ESR, CRP, vitamin D  >> OFFICE visit in August/September 2024 (at least 1 week after labs/imaging)     I discussed the assessment and treatment plan with the patient. The patient was provided an opportunity to ask questions and all were answered. The patient agreed with the plan and demonstrated an understanding of the instructions.   The patient was advised to call back or seek an in-person evaluation if the symptoms worsen or if the condition fails to improve as anticipated.  I provided 22 minutes of non-face-to-face time during this encounter.  Carnella Guadalajara, PA-C 02/21/23 2:52 PM

## 2023-02-21 ENCOUNTER — Inpatient Hospital Stay (HOSPITAL_BASED_OUTPATIENT_CLINIC_OR_DEPARTMENT_OTHER): Admitting: Physician Assistant

## 2023-02-21 DIAGNOSIS — C50912 Malignant neoplasm of unspecified site of left female breast: Secondary | ICD-10-CM | POA: Diagnosis not present

## 2023-02-21 DIAGNOSIS — D500 Iron deficiency anemia secondary to blood loss (chronic): Secondary | ICD-10-CM | POA: Diagnosis not present

## 2023-02-21 DIAGNOSIS — D649 Anemia, unspecified: Secondary | ICD-10-CM

## 2023-02-21 DIAGNOSIS — M810 Age-related osteoporosis without current pathological fracture: Secondary | ICD-10-CM

## 2023-03-27 ENCOUNTER — Encounter (HOSPITAL_COMMUNITY): Payer: Self-pay | Admitting: Hematology

## 2023-04-07 ENCOUNTER — Encounter (HOSPITAL_COMMUNITY): Payer: Self-pay | Admitting: Hematology

## 2023-04-08 ENCOUNTER — Encounter: Payer: Self-pay | Admitting: Urology

## 2023-04-08 ENCOUNTER — Ambulatory Visit: Admitting: Urology

## 2023-04-08 VITALS — BP 175/70 | HR 56

## 2023-04-08 DIAGNOSIS — N952 Postmenopausal atrophic vaginitis: Secondary | ICD-10-CM

## 2023-04-08 DIAGNOSIS — Z87440 Personal history of urinary (tract) infections: Secondary | ICD-10-CM | POA: Diagnosis not present

## 2023-04-08 DIAGNOSIS — N3000 Acute cystitis without hematuria: Secondary | ICD-10-CM

## 2023-04-08 DIAGNOSIS — N390 Urinary tract infection, site not specified: Secondary | ICD-10-CM

## 2023-04-08 MED ORDER — METHENAMINE HIPPURATE 1 G PO TABS
ORAL_TABLET | ORAL | 11 refills | Status: AC
Start: 2023-04-08 — End: ?

## 2023-04-08 NOTE — Progress Notes (Signed)
History of Present Illness: Paige Rhodes is a 89 y.o. year old female here for follow-up of recurrent UTIs/vaginal atrophy.  She is on methenamine hippurate 1 g twice a day, on probiotic as well as perivaginal estrogen cream.  Since her visit here 3 months ago she has been asymptomatic.  Past Medical History:  Diagnosis Date   Breast cancer (HCC)    Chronic renal disease, stage 2, mildly decreased glomerular filtration rate between 60-89 mL/min/1.73 square meter 10/31/2014   COPD (chronic obstructive pulmonary disease) (HCC)    Diabetes mellitus    GERD (gastroesophageal reflux disease)    occasional   Glaucoma    Gout attack june 2013   left foot   Hard of hearing    Hyperlipidemia    Hypertension    Hypothyroidism    Infiltrating ductal carcinoma of left breast 03/05/2011   On home oxygen therapy    2 LPM   Osteoporosis    Port catheter in place 01/15/2012   SOB (shortness of breath)    Thyroid disease     Past Surgical History:  Procedure Laterality Date   ANKLE SURGERY  1998   APPENDECTOMY  1955   BREAST LUMPECTOMY  2011   left   CATARACT EXTRACTION W/PHACO  09/03/2012   Procedure: CATARACT EXTRACTION PHACO AND INTRAOCULAR LENS PLACEMENT (IOC);  Surgeon: Gemma Payor, MD;  Location: [facility];  Service: Ophthalmology;  Laterality: Right;  CDE: 15.26   CATARACT EXTRACTION W/PHACO  09/28/2012   Procedure: CATARACT EXTRACTION PHACO AND INTRAOCULAR LENS PLACEMENT (IOC);  Surgeon: Gemma Payor, MD;  Location: [facility];  Service: Ophthalmology;  Laterality: Left;  CDE:  21.60   CHOLECYSTECTOMY  1990   INGUINAL HERNIA REPAIR Left 11/14/2022   Procedure: INCARCERATED INGUINAL HERNIA REPAIR WITH MESH;  Surgeon: Lewie Chamber, DO;  Location: [facility];  Service: General;  Laterality: Left;   OPEN REDUCTION INTERNAL FIXATION (ORIF) DISTAL RADIAL FRACTURE Left 07/01/2019   Procedure: OPEN REDUCTION INTERNAL FIXATION (ORIF) LEFT DISTAL RADIAL FRACTURE;  Surgeon: Vickki Hearing, MD;   Location: [facility];  Service: Orthopedics;  Laterality: Left;   PORT-A-CATH REMOVAL N/A 04/26/2015   Procedure: REMOVAL PORT-A-CATH;  Surgeon: Franky Macho, MD;  Location: [facility];  Service: General;  Laterality: N/A;   PORTACATH PLACEMENT  05/23/10   THYROID SURGERY  1994   TONSILLECTOMY     TOTAL HIP ARTHROPLASTY  1996    Home Medications:  (Not in a hospital admission)   Allergies:  Allergies  Allergen Reactions   Penicillins Anaphylaxis    Edema of throat   Hctz [Hydrochlorothiazide] Other (See Comments)    gout   Morphine And Codeine Itching   Lotrel [Amlodipine Besy-Benazepril Hcl] Other (See Comments)    Facial muscles draw and break out in whilps    Family History  Problem Relation Age of Onset   Kidney disease Mother    Heart disease Father    Hypertension Brother    Cancer Sister        breast and lung    Social History:  reports that she quit smoking about 31 years ago. Her smoking use included cigarettes. She started smoking about 61 years ago. She has a 90 pack-year smoking history. She has never used smokeless tobacco. She reports that she does not drink alcohol and does not use drugs.  ROS: A complete review of systems was performed.  All systems are negative except for pertinent findings as noted.  Physical Exam:  Vital signs  in last 24 hours: @VSRANGES @ General:  Alert and oriented, No acute distress HEENT: Normocephalic, atraumatic Neck: No JVD or lymphadenopathy Cardiovascular: Regular rate  Extremities: No edema Neurologic: Grossly intact  I have reviewed prior pt notes  I have reviewed urinalysis results  I have reviewed prior urine culture   Impression/Assessment:  Recurrent cystitis with vaginal atrophic change, on methenamine hippurate as well as perivaginal estrogen cream.  Asymptomatic  Plan:  1.  Continue methenamine but I will decrease her dose to 500 mg (one half of a 1 g tablet) twice a day.  Prescription written and sent to  [facility]  2.  Continue estrogen cream long-term  3.  I will have her come back as needed  Chelsea Aus 04/08/2023, 8:53 AM  Bertram Millard.  MD

## 2023-04-15 ENCOUNTER — Encounter (HOSPITAL_COMMUNITY): Payer: Self-pay | Admitting: Hematology

## 2023-04-22 ENCOUNTER — Ambulatory Visit (HOSPITAL_COMMUNITY)
Admission: RE | Admit: 2023-04-22 | Discharge: 2023-04-22 | Disposition: A | Discharge status: Home / Self Care | Source: Ambulatory Visit | Attending: Physician Assistant | Admitting: Physician Assistant

## 2023-04-22 ENCOUNTER — Inpatient Hospital Stay

## 2023-04-22 ENCOUNTER — Other Ambulatory Visit (HOSPITAL_COMMUNITY): Payer: Medicare Other

## 2023-04-22 ENCOUNTER — Ambulatory Visit: Payer: Medicare Other

## 2023-04-22 ENCOUNTER — Inpatient Hospital Stay: Attending: Hematology

## 2023-04-22 VITALS — BP 165/90 | HR 100 | Temp 97.2°F | Resp 20

## 2023-04-22 DIAGNOSIS — M810 Age-related osteoporosis without current pathological fracture: Secondary | ICD-10-CM

## 2023-04-22 DIAGNOSIS — Z853 Personal history of malignant neoplasm of breast: Secondary | ICD-10-CM | POA: Insufficient documentation

## 2023-04-22 DIAGNOSIS — D509 Iron deficiency anemia, unspecified: Secondary | ICD-10-CM | POA: Diagnosis not present

## 2023-04-22 DIAGNOSIS — D500 Iron deficiency anemia secondary to blood loss (chronic): Secondary | ICD-10-CM

## 2023-04-22 DIAGNOSIS — Z87891 Personal history of nicotine dependence: Secondary | ICD-10-CM | POA: Insufficient documentation

## 2023-04-22 DIAGNOSIS — Z79899 Other long term (current) drug therapy: Secondary | ICD-10-CM | POA: Insufficient documentation

## 2023-04-22 DIAGNOSIS — D649 Anemia, unspecified: Secondary | ICD-10-CM

## 2023-04-22 DIAGNOSIS — C50912 Malignant neoplasm of unspecified site of left female breast: Secondary | ICD-10-CM

## 2023-04-22 LAB — CBC WITH DIFFERENTIAL/PLATELET
Abs Immature Granulocytes: 0.02 10*3/uL (ref 0.00–0.07)
Basophils Absolute: 0 10*3/uL (ref 0.0–0.1)
Basophils Relative: 0 %
Eosinophils Absolute: 0.2 10*3/uL (ref 0.0–0.5)
Eosinophils Relative: 3 %
HCT: 33.6 % — ABNORMAL LOW (ref 36.0–46.0)
Hemoglobin: 10.3 g/dL — ABNORMAL LOW (ref 12.0–15.0)
Immature Granulocytes: 0 %
Lymphocytes Relative: 26 %
Lymphs Abs: 1.9 10*3/uL (ref 0.7–4.0)
MCH: 29.9 pg (ref 26.0–34.0)
MCHC: 30.7 g/dL (ref 30.0–36.0)
MCV: 97.7 fL (ref 80.0–100.0)
Monocytes Absolute: 0.5 10*3/uL (ref 0.1–1.0)
Monocytes Relative: 7 %
Neutro Abs: 4.6 10*3/uL (ref 1.7–7.7)
Neutrophils Relative %: 64 %
Platelets: 183 10*3/uL (ref 150–400)
RBC: 3.44 MIL/uL — ABNORMAL LOW (ref 3.87–5.11)
RDW: 13.3 % (ref 11.5–15.5)
WBC: 7.3 10*3/uL (ref 4.0–10.5)
nRBC: 0 % (ref 0.0–0.2)

## 2023-04-22 LAB — COMPREHENSIVE METABOLIC PANEL
ALT: 17 U/L (ref 0–44)
AST: 17 U/L (ref 15–41)
Albumin: 3.6 g/dL (ref 3.5–5.0)
Alkaline Phosphatase: 57 U/L (ref 38–126)
Anion gap: 8 (ref 5–15)
BUN: 29 mg/dL — ABNORMAL HIGH (ref 8–23)
CO2: 36 mmol/L — ABNORMAL HIGH (ref 22–32)
Calcium: 9.1 mg/dL (ref 8.9–10.3)
Chloride: 96 mmol/L — ABNORMAL LOW (ref 98–111)
Creatinine, Ser: 0.92 mg/dL (ref 0.44–1.00)
GFR, Estimated: 60 mL/min — ABNORMAL LOW (ref >60)
Glucose, Bld: 117 mg/dL — ABNORMAL HIGH (ref 70–99)
Potassium: 4.1 mmol/L (ref 3.5–5.1)
Sodium: 140 mmol/L (ref 135–145)
Total Bilirubin: 0.5 mg/dL (ref 0.3–1.2)
Total Protein: 7.4 g/dL (ref 6.5–8.1)

## 2023-04-22 LAB — RETICULOCYTES
Immature Retic Fract: 15.2 % (ref 2.3–15.9)
RBC.: 3.42 MIL/uL — ABNORMAL LOW (ref 3.87–5.11)
Retic Count, Absolute: 58.5 10*3/uL (ref 19.0–186.0)
Retic Ct Pct: 1.7 % (ref 0.4–3.1)

## 2023-04-22 LAB — VITAMIN D 25 HYDROXY (VIT D DEFICIENCY, FRACTURES): Vit D, 25-Hydroxy: 87.31 ng/mL (ref 30–100)

## 2023-04-22 LAB — IRON AND TIBC
Iron: 64 ug/dL (ref 28–170)
Saturation Ratios: 26 % (ref 10.4–31.8)
TIBC: 246 ug/dL — ABNORMAL LOW (ref 250–450)
UIBC: 182 ug/dL

## 2023-04-22 LAB — C-REACTIVE PROTEIN: CRP: 0.7 mg/dL (ref <1.0)

## 2023-04-22 LAB — FERRITIN: Ferritin: 302 ng/mL (ref 11–307)

## 2023-04-22 LAB — SEDIMENTATION RATE: Sed Rate: 36 mm/hr — ABNORMAL HIGH (ref 0–22)

## 2023-04-22 LAB — LACTATE DEHYDROGENASE: LDH: 150 U/L (ref 98–192)

## 2023-04-22 LAB — VITAMIN B12: Vitamin B-12: 517 pg/mL (ref 180–914)

## 2023-04-22 MED ORDER — DENOSUMAB 60 MG/ML ~~LOC~~ SOSY
60.0000 mg | PREFILLED_SYRINGE | Freq: Once | SUBCUTANEOUS | Status: AC
  Administered 2023-04-22: 60 mg via SUBCUTANEOUS
  Filled 2023-04-22: qty 1

## 2023-04-22 NOTE — Progress Notes (Signed)
Patient tolerated injection with no complaints voiced.  Site clean and dry with no bruising or swelling noted at site.  See MAR for details.  Band aid applied.  Patient stable during and after injection.  Vss with discharge and left in satisfactory condition with no s/s of distress noted.  

## 2023-04-22 NOTE — Patient Instructions (Signed)

## 2023-04-23 ENCOUNTER — Other Ambulatory Visit: Payer: Medicare Other

## 2023-04-23 ENCOUNTER — Ambulatory Visit: Payer: Medicare Other

## 2023-04-25 LAB — METHYLMALONIC ACID, SERUM: Methylmalonic Acid, Quantitative: 418 nmol/L — ABNORMAL HIGH (ref 0–378)

## 2023-05-07 ENCOUNTER — Ambulatory Visit: Payer: Medicare Other | Admitting: Physician Assistant

## 2023-05-10 NOTE — Progress Notes (Unsigned)
[facility] [address], [place] [address]   CLINIC:  Medical Oncology/Hematology  PCP:  Carylon Perches, MD [address] / [place] [address] [phone]   REASON FOR VISIT:  Follow-up for history of left breast cancer & iron deficiency anemia   PRIOR THERAPY: - Lumpectomy (August 2011) - Carboplatin/Taxol x4 cycles and Herceptin for total of 1 year - Radiation therapy to the left breast   CURRENT THERAPY: Surveillance  CANCER STAGING:  Cancer Staging  Infiltrating ductal carcinoma of left breast Staging form: Breast, AJCC 7th Edition - Clinical: Stage IIA (T2, N0, cM0) - Signed by Paige Newer, PA on 03/05/2011 - Pathologic: Stage IIA (T2, N0, cM0) - Signed by Paige Paris, MD on 12/24/2011   INTERVAL HISTORY:   [name], a 89 y.o. female, returns for routine follow-up of her history of breast cancer and her iron deficiency anemia. [name] was last evaluated via telemedicine visit with Rojelio Brenner PA-C on 02/21/2023.   At today's visit, she  reports feeling fair.  She denies any recent hospitalizations, surgeries, or changes in her  baseline health status.  BREAST CANCER: She denies any symptoms of recurrence such as new lumps, bone pain, or abdominal pain.  She has no new headaches, seizures, or focal neurologic deficits.  No B symptoms such as fever, chills, night sweats, unintentional weight loss.  OSTEOPOROSIS: She is tolerating Prolia injections well and continues to take calcium and vitamin D supplements at home.  She denies any bone pain, fractures, or jaw pain.  No recent dental issues.  IRON DEFICIENCY ANEMIA: She continues to notice scant hemorrhoid bleeding with each bowel movement; she denies any severe hematochezia or melena.  She does report fatigue, but denies any pica, chest pain, headaches, lightheadedness, or syncope.  She has chronic dyspnea on exertion from her COPD and is on 3.5 L of supplemental  oxygen around-the-clock.  She reports little to no energy and 100% appetite.  She is maintaining stable weight at this time.  ASSESSMENT & PLAN:  1.  Stage IIa infiltrating ductal carcinoma (HER2 amplified) of the left breast - Diagnosed in July 2011 - Lumpectomy in August 2011: Cancer was 2.3 cm in size, negative sentinel lymph nodes, ER/PR negative, HER2 amplified, Ki-67 of 80%, 3 lymph nodes negative.  However, lymphovascular invasion was found. - Treated with carboplatin/Taxol x4 cycles, and Herceptin for total of 1 year (last dose on 05/15/2011) - She finished radiation therapy to the left breast by Dr. Michell Heinrich from 08/16/2010 through 09/18/2010 - Most recent mammogram (10/28/2022): BI-RADS Category 1, negative, no findings suspicious for malignancy in either breast - Physical exam benign, no palpable nodules, masses, or lymphadenopathy on exam   .  She has chronic left breast thickness and postradiation changes. - No alarm or red flag symptoms    - Most recent labs (04/22/2023): CBC with mild anemia (Hgb 10.3) otherwise normal.  CMP unremarkable with normal creatinine 0.93 and normal LFTs. - PLAN: Patient would like to STOP receiving annual mammograms at this point in time.  We discussed that stopping mammograms would make it more difficult to identify any recurrent breast cancer in a timely manner, and she is agreeable with this risk. - Continue annual in person follow-ups and labs, next due September 2025.     2.  Osteoporosis - Bone density (02/26/2021) with T score of -3.4 (measured at forearm radius) - Most recent bone density/DEXA (04/22/2023): T-score - 4. - Was  previously on Fosamax, but with worsening of her osteoporosis. - Switched to Prolia every 6 months as of 04/10/2021 - She takes calcium and vitamin D (OTC, unknown dose) - No jaw pain, bone pain, fractures, or dental issues. - Most recent vitamin D (04/22/2023) within normal limits at 87.31.  Calcium 9.1. - PLAN: Continue  Prolia and CMP every 6 months (next due February 2025) - Repeat bone density scan in August 2026.   -- Decrease Vitamin D to every other day dosing    3.   Normocytic anemia - She has had chronic anemia since at least 2011, with baseline Hgb around 10.0-11.5 - CBC from 04/16/2022 showed progressive normocytic anemia with Hgb 9.5/MCV 91.3 - Hematology work-up (04/16/2022): Ferritin 70, serum iron 23, iron saturation 8% Normal folate, copper, B12, MMA SPEP negative for M spike.  Immunofixation shows polyclonal immunoglobulins.  Elevated kappa free light chain 70.7, elevated lambda 31.6, elevated ratio 2.24. - She reports bleeding hemorrhoids, but bleeding is less intense than previously. - Unable to tolerate iron supplement due to severe constipation that worsens her hemorrhoid bleeding. - Most recent IV Feraheme x 1 on 12/23/2022 - Most recent labs (04/22/2023):  Hgb 10.3/MCV 97.7, normal WBC and platelets. Reticulocytes 1.7% (insufficient response to degree of anemia).  Normal LDH. Normal creatinine 0.92.  Normal CRP, but mildly elevated ESR 36. Normal vitamin B12 517, but with elevated MMA 418. Ferritin 302, iron saturation 26%. - DIFFERENTIAL DIAGNOSIS favors anemia secondary to functional iron deficiency in the setting of chronic disease as well as iron deficiency from rectal bleeding.  Appears to have some element of functional B12 deficiency based on elevated MMA.  Differential diagnosis would also include infiltrative bone marrow disorder, but more invasive testing has been deferred due to advanced age and other comorbidities.   - PLAN: Vitamin B12 injection today + vitamin B12 1000 mcg daily.   - No indication for IV iron at this time. - Due for repeat light chains/MGUS panel annually - Repeat labs (CBC/D, CMP, B12, MMA, ferritin, iron/TIBC) in 3 months followed by PHONE  visit - Patient would like to hold off on GI referral for the time being, she is aware that they offer hemorrhoid  banding if her hematochezia should worsen. - If she has any severe unexplained worsening of anemia, would consider bone marrow biopsy.  However, due to advanced age and other comorbidities more conservative monitoring and treatment is preferred.    4.  Leukocytosis - She has intermittent leukocytosis in the setting of daily Breo Ellipta (steroid containing inhaler) and frequent UTIs - Reports frequent UTIs over the past year, but this has improved after being placed on suppressive antibiotics by urology - No B symptoms or lymphadenopathy. - Most recent WBC/differential was normal - PLAN: Suspect reactive leukocytosis in the setting of inhaled steroids.  We will continue to monitor with repeat CBC and consider MPN work-up in the future if she has significant leukocytosis.  PLAN SUMMARY: >> Labs in 3 months = CBC/D, CMP, B12, MMA, ferritin, iron/TIBC, light chains, SPEP, immunofixation, LDH >> PHONE visit in 3 months (1 week after labs) >> CMP + Prolia every 6 months (next due February 2025)    REVIEW OF SYSTEMS:   Review of Systems  Constitutional:  Positive for fatigue. Negative for appetite change, chills, diaphoresis, fever and unexpected weight change.  HENT:   Negative for lump/mass and nosebleeds.   Eyes:  Negative for eye problems.  Respiratory:  Positive for shortness of breath. Negative  for cough and hemoptysis.   Cardiovascular:  Negative for chest pain, leg swelling and palpitations.  Gastrointestinal:  Negative for abdominal pain, blood in stool, constipation, diarrhea, nausea and vomiting.  Genitourinary:  Negative for hematuria.   Skin: Negative.   Neurological:  Negative for dizziness, headaches and light-headedness.  Hematological:  Does not bruise/bleed easily.    PHYSICAL EXAM:   Performance status (ECOG): 2 - Symptomatic, <50% confined to bed  Vitals:   05/12/23 1337  BP: (!) 178/69  Pulse: 60  Resp: 20  Temp: 97.8 F (36.6 C)  SpO2: 100%   Wt Readings from  Last 3 Encounters:  05/12/23 162 lb 7.7 oz (73.7 kg)  11/26/22 163 lb (73.9 kg)  11/14/22 165 lb (74.8 kg)   Physical Exam Constitutional:      Appearance: Normal appearance.     Interventions: Nasal cannula in place.  HENT:     Head: Normocephalic and atraumatic.     Mouth/Throat:     Mouth: Mucous membranes are moist.  Eyes:     Extraocular Movements: Extraocular movements intact.     Pupils: Pupils are equal, round, and reactive to light.  Cardiovascular:     Rate and Rhythm: Normal rate and regular rhythm.     Pulses: Normal pulses.     Heart sounds: Normal heart sounds.  Pulmonary:     Effort: Pulmonary effort is normal.     Breath sounds: Decreased air movement present. Decreased breath sounds present.  Chest:    Abdominal:     General: Bowel sounds are normal.     Palpations: Abdomen is soft.     Tenderness: There is no abdominal tenderness.  Musculoskeletal:        General: No swelling.     Cervical back: Normal range of motion.     Right lower leg: No edema.     Left lower leg: No edema.  Lymphadenopathy:     Cervical: No cervical adenopathy.  Skin:    General: Skin is warm and dry.  Neurological:     General: No focal deficit present.     Mental Status: She is alert and oriented to person, place, and time.  Psychiatric:        Mood and Affect: Mood normal.        Behavior: Behavior normal.      PAST MEDICAL/SURGICAL HISTORY:  Past Medical History:  Diagnosis Date   Breast cancer (HCC)    Chronic renal disease, stage 2, mildly decreased glomerular filtration rate between 60-89 mL/min/1.73 square meter 10/31/2014   COPD (chronic obstructive pulmonary disease) (HCC)    Diabetes mellitus    GERD (gastroesophageal reflux disease)    occasional   Glaucoma    Gout attack june 2013   left foot   Hard of hearing    Hyperlipidemia    Hypertension    Hypothyroidism    Infiltrating ductal carcinoma of left breast 03/05/2011   On home oxygen therapy    2  LPM   Osteoporosis    Port catheter in place 01/15/2012   SOB (shortness of breath)    Thyroid disease    Past Surgical History:  Procedure Laterality Date   ANKLE SURGERY  1998   APPENDECTOMY  1955   BREAST LUMPECTOMY  2011   left   CATARACT EXTRACTION W/PHACO  09/03/2012   Procedure: CATARACT EXTRACTION PHACO AND INTRAOCULAR LENS PLACEMENT (IOC);  Surgeon: Gemma Payor, MD;  Location: [facility];  Service: Ophthalmology;  Laterality: Right;  CDE: 15.26   CATARACT EXTRACTION W/PHACO  09/28/2012   Procedure: CATARACT EXTRACTION PHACO AND INTRAOCULAR LENS PLACEMENT (IOC);  Surgeon: Gemma Payor, MD;  Location: [facility];  Service: Ophthalmology;  Laterality: Left;  CDE:  21.60   CHOLECYSTECTOMY  1990   INGUINAL HERNIA REPAIR Left 11/14/2022   Procedure: INCARCERATED INGUINAL HERNIA REPAIR WITH MESH;  Surgeon: Lewie Chamber, DO;  Location: [facility];  Service: General;  Laterality: Left;   OPEN REDUCTION INTERNAL FIXATION (ORIF) DISTAL RADIAL FRACTURE Left 07/01/2019   Procedure: OPEN REDUCTION INTERNAL FIXATION (ORIF) LEFT DISTAL RADIAL FRACTURE;  Surgeon: Vickki Hearing, MD;  Location: [facility];  Service: Orthopedics;  Laterality: Left;   PORT-A-CATH REMOVAL N/A 04/26/2015   Procedure: REMOVAL PORT-A-CATH;  Surgeon: Franky Macho, MD;  Location: [facility];  Service: General;  Laterality: N/A;   PORTACATH PLACEMENT  05/23/10   THYROID SURGERY  1994   TONSILLECTOMY     TOTAL HIP ARTHROPLASTY  1996    SOCIAL HISTORY:  Social History   Socioeconomic History   Marital status: Married    Spouse name: Not on file   Number of children: Not on file   Years of education: Not on file   Highest education level: Not on file  Occupational History   Not on file  Tobacco Use   Smoking status: Former    Current packs/day: 0.00    Average packs/day: 3.0 packs/day for 30.0 years (90.0 ttl pk-yrs)    Types: Cigarettes    Start date: 09/01/1961    Quit date: 09/02/1991    Years since quitting: 31.7    Smokeless tobacco: Never  Substance and Sexual Activity   Alcohol use: No   Drug use: No   Sexual activity: Yes    Birth control/protection: Post-menopausal  Other Topics Concern   Not on file  Social History Narrative   Not on file   Social Determinants of Health   Financial Resource Strain: Not on file  Food Insecurity: No Food Insecurity (11/14/2022)   Hunger Vital Sign    Worried About Running Out of Food in the Last Year: Never true    Ran Out of Food in the Last Year: Never true  Transportation Needs: No Transportation Needs (11/14/2022)   PRAPARE - Administrator, Civil Service (Medical): No    Lack of Transportation (Non-Medical): No  Physical Activity: Not on file  Stress: Not on file  Social Connections: Not on file  Intimate Partner Violence: Not At Risk (11/14/2022)   Humiliation, Afraid, Rape, and Kick questionnaire    Fear of Current or Ex-Partner: No    Emotionally Abused: No    Physically Abused: No    Sexually Abused: No    FAMILY HISTORY:  Family History  Problem Relation Age of Onset   Kidney disease Mother    Heart disease Father    Hypertension Brother    Cancer Sister        breast and lung    CURRENT MEDICATIONS:  Current Outpatient Medications  Medication Sig Dispense Refill   acetaminophen (TYLENOL) 500 MG tablet Take 2 tablets (1,000 mg total) by mouth every 6 (six) hours as needed for mild pain, moderate pain, fever or headache. 30 tablet 0   albuterol (VENTOLIN HFA) 108 (90 Base) MCG/ACT inhaler Inhale 2 puffs into the lungs every 6 (six) hours as needed for wheezing or shortness of breath.     amLODipine (NORVASC) 5 MG tablet Take 5 mg by mouth daily.  Ascorbic Acid (VITAMIN C PO) Take 1 tablet by mouth daily.     aspirin EC 81 MG tablet Take 81 mg by mouth daily.     atorvastatin (LIPITOR) 10 MG tablet Take 10 mg by mouth daily.     Calcium-Magnesium-Vitamin D (CALCIUM 500 PO) Take 1 tablet by mouth daily.     carvedilol  (COREG) 25 MG tablet Take 25 mg by mouth 2 (two) times daily with a meal.       Cholecalciferol (VITAMIN D-3) 1000 units CAPS Take 1,000 Units by mouth daily.     diphenhydrAMINE HCl, Sleep, 50 MG CAPS Take 50 mg by mouth at bedtime.     doxycycline (VIBRA-TABS) 100 MG tablet Take by mouth.     estradiol (ESTRACE) 0.1 MG/GM vaginal cream Use 1/2 inch in applicator per vagina 2 nights/week 42.5 g 3   fluticasone furoate-vilanterol (BREO ELLIPTA) 100-25 MCG/ACT AEPB Inhale 1 puff into the lungs daily. 60 each 6   GOODSENSE CLEARLAX 17 GM/SCOOP powder SMARTSIG:1 Capful(s) By Mouth Daily     hydrocortisone (ANUSOL-HC) 2.5 % rectal cream Apply 1 Application topically 2 (two) times daily.     lactobacillus acidophilus (BACID) TABS tablet Take 2 tablets by mouth 3 (three) times daily.     levothyroxine (SYNTHROID, LEVOTHROID) 125 MCG tablet Take 125 mcg by mouth daily.       losartan (COZAAR) 50 MG tablet Take 50 mg by mouth daily.       methenamine (HIPREX) 1 g tablet 1/2 tab twice daily 60 tablet 11   Multiple Vitamin (MULTIVITAMIN) tablet Take 1 tablet by mouth daily.     phenazopyridine (PYRIDIUM) 95 MG tablet Take 95 mg by mouth 2 (two) times daily.     No current facility-administered medications for this visit.   Facility-Administered Medications Ordered in Other Visits  Medication Dose Route Frequency Provider Last Rate Last Admin   sodium chloride 0.9 % injection 10 mL  10 mL Intravenous PRN Alla German, MD   10 mL at 05/05/14 1150    ALLERGIES:  Allergies  Allergen Reactions   Penicillins Anaphylaxis    Edema of throat   Hctz [Hydrochlorothiazide] Other (See Comments)    gout   Morphine And Codeine Itching   Lotrel [Amlodipine Besy-Benazepril Hcl] Other (See Comments)    Facial muscles draw and break out in whilps    LABORATORY DATA:  I have reviewed the labs as listed.     Latest Ref Rng & Units 04/22/2023   12:38 PM 02/13/2023    2:53 PM 11/15/2022    5:19 AM  CBC   WBC 4.0 - 10.5 K/uL 7.3  7.4  10.3   Hemoglobin 12.0 - 15.0 g/dL 73.2  20.2  9.5   Hematocrit 36.0 - 46.0 % 33.6  32.4  30.2   Platelets 150 - 400 K/uL 183  177  202       Latest Ref Rng & Units 04/22/2023   12:38 PM 02/13/2023    2:53 PM 11/15/2022    5:19 AM  CMP  Glucose 70 - 99 mg/dL 542  706  237   BUN 8 - 23 mg/dL 29  26  25    Creatinine 0.44 - 1.00 mg/dL 6.28  3.15  1.76   Sodium 135 - 145 mmol/L 140  141  138   Potassium 3.5 - 5.1 mmol/L 4.1  3.7  4.2   Chloride 98 - 111 mmol/L 96  99  101   CO2 22 - 32  mmol/L 36  34  29   Calcium 8.9 - 10.3 mg/dL 9.1  9.4  8.3   Total Protein 6.5 - 8.1 g/dL 7.4     Total Bilirubin 0.3 - 1.2 mg/dL 0.5     Alkaline Phos 38 - 126 U/L 57     AST 15 - 41 U/L 17     ALT 0 - 44 U/L 17       DIAGNOSTIC IMAGING:  I have independently reviewed the scans and discussed with the patient. DG Bone Density  Result Date: 04/22/2023 EXAM: DUAL X-RAY ABSORPTIOMETRY (DXA) FOR BONE MINERAL DENSITY IMPRESSION: Your patient Driana Hughett completed a BMD test on 04/22/2023 using the [employer] DXA System (software version: 14.10) manufactured by [employer]. The following summarizes the results of our evaluation. Technologist: [facility] PATIENT BIOGRAPHICAL: Name: Graciana, Nothdurft Patient ID:  [id number] Birth Date: 12-Aug-1932   Height:     62.0 in. Gender:      Female Exam Date:  04/22/2023   Weight:     163.0 lbs. Indications: Advanced Age, Caucasian, Follow up Osteoporosis, Height Loss, History of Fracture (Adult), Hx Breast Ca, Hyperparathyroid, Post Menopausal Fractures: Wrist, Ankle Treatments: Asprin, Calcium, Estrace Cream, Multivitamin, Prolia, Synthroid, Vitamin D DENSITOMETRY RESULTS: Site          Region     Measured Date Measured Age WHO Classification Young Adult T-score BMD         %Change vs. Previous Significant Change (*) Left Femur Neck 04/22/2025 89.5 Osteopenia -1.6 0.816 g/cm2 8.2% Yes Left Femur Neck 02[age]26/2021 87.3 Osteopenia -2.0 0.754  g/cm2 5.3% Yes Left Femur Neck 06/22/2018 84.6 Osteopenia -2.3 0.716 g/cm2 6.5% Yes Left Femur Neck 05/21/2016 82.6 Osteoporosis -2.6 0.672 g/cm2 -7.1% Yes Left Femur Neck 04/26/2013 79.5 Osteopenia -2.3 0.723 g/cm2 - - Left Femur Total 04/22/2023 89.5 Osteoporosis -2.5 0.699 g/cm2 8.4% Yes Left Femur Total 02/26/2021 87.3 Osteoporosis -2.9 0.645 g/cm2 1.3% - Left Femur Total 06/22/2018 84.6 Osteoporosis -2.9 0.637 g/cm2 -1.4% - Left Femur Total 05/21/2016 82.6 Osteoporosis -2.9 0.646 g/cm2 -2.7% - Left Femur Total 04/26/2013 79.5 Osteoporosis -2.7 0.664 g/cm2 - - Right Forearm Radius 33% 04/22/2023 89.5 Osteoporosis -4.0 0.431 g/cm2 -8.2% - Right Forearm Radius 33% 02/26/2021 87.3 Osteoporosis -3.4 0.469 g/cm2 1.2% - Right Forearm Radius 33% 06/22/2018 84.6 Osteoporosis -3.5 0.464 g/cm2 - - ASSESSMENT: The BMD measured at Forearm Radius 33% is 0.431 g/cm2 with a T-score of -4.0. This patient is considered osteoporotic according to World Health Organization [facility]) criteria. The scan quality is good. Compared with the prior study on 02/26/21, the BMD of the lt. total hip shows a statistically significant increase. The rt. hip was excluded due to surgical repair. Lumbar spine was excluded due to advanced degenerative changes. World Science writer [facility]) criteria for post-menopausal, Caucasian Women: Normal:       T-score at or above -1 SD Osteopenia:   T-score between -1 and -2.5 SD Osteoporosis: T-score at or below -2.5 SD RECOMMENDATIONS: 1. All patients should optimize calcium and vitamin D intake. 2. Consider FDA-approved medical therapies in postmenopausal women and med aged 52 years and older, based on the following: a. A hip or vertebral (clinical or morphometric) fracture b. T-score< -2.5 at the femoral neck or spine after appropriate evaluation to exclude secondary causes c. Low bone mass (T-score between -1.0 and -2.5 at the femoral neck or spine) and a 10-year probability of a hip fracture > 3% or a  10-year probability of a major osteoporosis-related  fracture > 20% based on the [place]-adapted WHO algorithm d. Clinician judgment and/or patient preferences may indicate treatment for people with 10-year fracture probabilities above or below these levels FOLLOW-UP: Patients with diagnosis of osteoporosis or at high risk for fracture should have regular bone mineral density tests. For patients eligible for Medicare, routine testing is allowed once every 2 years. The testing frequency can be increased to one year for patients who have rapidly progressing disease, those who are receiving or discontinuing medical therapy to restore bone mass, or have additional risk factors. I have reviewed this report, and agree with the above findings. [facility] Electronically Signed   By: Frederico Hamman M.D.   On: 04/22/2023 14:03     WRAP UP:  All questions were answered. The patient knows to call the clinic with any problems, questions or concerns.  Medical decision making: Moderate  Time spent on visit: I spent 30 minutes counseling the patient face to face. The total time spent in the appointment was 45 minutes and more than 50% was on counseling.  Carnella Guadalajara, PA-C  05/12/23 3:24 PM

## 2023-05-12 ENCOUNTER — Inpatient Hospital Stay: Attending: Hematology | Admitting: Physician Assistant

## 2023-05-12 ENCOUNTER — Inpatient Hospital Stay

## 2023-05-12 VITALS — BP 178/69 | HR 60 | Temp 97.8°F | Resp 20 | Wt 162.5 lb

## 2023-05-12 DIAGNOSIS — D500 Iron deficiency anemia secondary to blood loss (chronic): Secondary | ICD-10-CM

## 2023-05-12 DIAGNOSIS — D72829 Elevated white blood cell count, unspecified: Secondary | ICD-10-CM | POA: Insufficient documentation

## 2023-05-12 DIAGNOSIS — Z9221 Personal history of antineoplastic chemotherapy: Secondary | ICD-10-CM | POA: Insufficient documentation

## 2023-05-12 DIAGNOSIS — D649 Anemia, unspecified: Secondary | ICD-10-CM | POA: Diagnosis not present

## 2023-05-12 DIAGNOSIS — Z853 Personal history of malignant neoplasm of breast: Secondary | ICD-10-CM | POA: Insufficient documentation

## 2023-05-12 DIAGNOSIS — M810 Age-related osteoporosis without current pathological fracture: Secondary | ICD-10-CM | POA: Diagnosis not present

## 2023-05-12 DIAGNOSIS — Z9981 Dependence on supplemental oxygen: Secondary | ICD-10-CM | POA: Diagnosis not present

## 2023-05-12 DIAGNOSIS — Z79899 Other long term (current) drug therapy: Secondary | ICD-10-CM | POA: Diagnosis not present

## 2023-05-12 DIAGNOSIS — Z923 Personal history of irradiation: Secondary | ICD-10-CM | POA: Diagnosis not present

## 2023-05-12 DIAGNOSIS — E538 Deficiency of other specified B group vitamins: Secondary | ICD-10-CM

## 2023-05-12 DIAGNOSIS — C50912 Malignant neoplasm of unspecified site of left female breast: Secondary | ICD-10-CM

## 2023-05-12 DIAGNOSIS — J449 Chronic obstructive pulmonary disease, unspecified: Secondary | ICD-10-CM | POA: Insufficient documentation

## 2023-05-12 MED ORDER — CYANOCOBALAMIN 1000 MCG/ML IJ SOLN
1000.0000 ug | Freq: Once | INTRAMUSCULAR | Status: AC
  Administered 2023-05-12: 1000 ug via INTRAMUSCULAR
  Filled 2023-05-12: qty 1

## 2023-05-12 NOTE — Progress Notes (Signed)
B12 injection given in r deltoid without incident.  Tolerated well.  Site CDI.  Patient discharged with family via wheelchair in stable condition.

## 2023-05-12 NOTE — Patient Instructions (Addendum)
[facility] at [facility] **VISIT SUMMARY & IMPORTANT INSTRUCTIONS **   You were seen today by Rojelio Brenner PA-C for your follow-up visit.    HISTORY OF BREAST CANCER Your most recent mammogram, labs, and physical exam did not show any evidence of returning breast cancer. Per your request, we will NOT order any future mammograms.  As we discussed, this will make it more difficult to detect any recurrent breast cancer, which puts you at risk for more advanced or serious cancer if it does return.  If you change your mind and would like to restart mammograms, please let us know and we will arrange this.  OSTEOPOROSIS Your osteoporosis is slightly worse than it was 2 years ago. Continue taking calcium and vitamin D, but DECREASE your vitamin D so that you are taking it every other day instead of daily. We will continue giving you Prolia injections every 6 months.  Be on the look out for any abnormal bone pain, broken bones, dental issues, or jaw pain. Please see the attached handout for important information regarding fall prevention.  ANEMIA Your blood level remains mildly low (hemoglobin 10.3). The cause of your anemia is unclear at this time, since your iron levels are normal.  Your anemia may be related to chronic disease. You do have mildly low vitamin B12, so we will give vitamin B12 injection today and would like you to start taking over-the-counter vitamin B12 1000 mcg daily. It is possible that you may have some underlying bone marrow condition (such as myelodysplastic syndrome), that is causing your anemia.  However, since your anemia is mild and does not require treatment at this time, we will not pursue any invasive testing such as bone marrow biopsy.  LABS: Return in 3 months for repeat labs  FOLLOW-UP APPOINTMENT: PHONE visit in 3 months (after labs)  ** Thank you for trusting me with your healthcare!  I strive to provide all of my patients with quality  care at each visit.  If you receive a survey for this visit, I would be so grateful to you for taking the time to provide feedback.  Thank you in advance!  ~ Esvin Hnat                   Dr. Doreatha Massed   &   Rojelio Brenner, PA-C   - - - - - - - - - - - - - - - - - -    Thank you for choosing  Cancer Center at [facility] to provide your oncology and hematology care.  To afford each patient quality time with our provider, please arrive at least 15 minutes before your scheduled appointment time.   If you have a lab appointment with the Cancer Center please come in thru the Main Entrance and check in at the main information desk.  You need to re-schedule your appointment should you arrive 10 or more minutes late.  We strive to give you quality time with our providers, and arriving late affects you and other patients whose appointments are after yours.  Also, if you no show three or more times for appointments you may be dismissed from the clinic at the providers discretion.     Again, thank you for choosing [facility].  Our hope is that these requests will decrease the amount of time that you wait before being seen by our physicians.       _____________________________________________________________  Should you have questions after your visit to [facility], please contact our office at [phone] and follow the prompts.  Our office hours are 8:00 a.m. and 4:30 p.m. Monday - Friday.  Please note that voicemails left after 4:00 p.m. may not be returned until the following business day.  We are closed weekends and major holidays.  You do have access to a nurse 24-7, just call the main number to the clinic [phone] and do not press any options, hold on the line and a nurse will answer the phone.    For prescription refill requests, have your pharmacy contact our office and allow 72 hours.

## 2023-05-15 ENCOUNTER — Encounter (HOSPITAL_COMMUNITY): Payer: Self-pay | Admitting: Hematology

## 2023-05-28 ENCOUNTER — Encounter (HOSPITAL_COMMUNITY): Payer: Self-pay | Admitting: Hematology

## 2023-06-02 ENCOUNTER — Encounter (HOSPITAL_COMMUNITY): Payer: Self-pay | Admitting: Hematology

## 2023-06-04 ENCOUNTER — Encounter (HOSPITAL_COMMUNITY): Payer: Self-pay | Admitting: Hematology

## 2023-06-19 ENCOUNTER — Encounter (HOSPITAL_COMMUNITY): Payer: Self-pay | Admitting: Hematology

## 2023-08-04 ENCOUNTER — Encounter (HOSPITAL_COMMUNITY): Payer: Self-pay | Admitting: Hematology

## 2023-08-11 ENCOUNTER — Inpatient Hospital Stay: Attending: Physician Assistant

## 2023-08-11 DIAGNOSIS — D500 Iron deficiency anemia secondary to blood loss (chronic): Secondary | ICD-10-CM

## 2023-08-11 DIAGNOSIS — D649 Anemia, unspecified: Secondary | ICD-10-CM | POA: Diagnosis not present

## 2023-08-11 DIAGNOSIS — Z79899 Other long term (current) drug therapy: Secondary | ICD-10-CM | POA: Diagnosis not present

## 2023-08-11 DIAGNOSIS — D72819 Decreased white blood cell count, unspecified: Secondary | ICD-10-CM | POA: Diagnosis not present

## 2023-08-11 DIAGNOSIS — Z87891 Personal history of nicotine dependence: Secondary | ICD-10-CM | POA: Insufficient documentation

## 2023-08-11 DIAGNOSIS — Z853 Personal history of malignant neoplasm of breast: Secondary | ICD-10-CM | POA: Insufficient documentation

## 2023-08-11 DIAGNOSIS — C50912 Malignant neoplasm of unspecified site of left female breast: Secondary | ICD-10-CM

## 2023-08-11 DIAGNOSIS — M810 Age-related osteoporosis without current pathological fracture: Secondary | ICD-10-CM | POA: Insufficient documentation

## 2023-08-11 LAB — CBC WITH DIFFERENTIAL/PLATELET
Abs Immature Granulocytes: 0.02 10*3/uL (ref 0.00–0.07)
Basophils Absolute: 0 10*3/uL (ref 0.0–0.1)
Basophils Relative: 0 %
Eosinophils Absolute: 0.2 10*3/uL (ref 0.0–0.5)
Eosinophils Relative: 2 %
HCT: 35 % — ABNORMAL LOW (ref 36.0–46.0)
Hemoglobin: 10.9 g/dL — ABNORMAL LOW (ref 12.0–15.0)
Immature Granulocytes: 0 %
Lymphocytes Relative: 14 %
Lymphs Abs: 1.2 10*3/uL (ref 0.7–4.0)
MCH: 30.8 pg (ref 26.0–34.0)
MCHC: 31.1 g/dL (ref 30.0–36.0)
MCV: 98.9 fL (ref 80.0–100.0)
Monocytes Absolute: 0.2 10*3/uL (ref 0.1–1.0)
Monocytes Relative: 3 %
Neutro Abs: 6.8 10*3/uL (ref 1.7–7.7)
Neutrophils Relative %: 81 %
Platelets: 210 10*3/uL (ref 150–400)
RBC: 3.54 MIL/uL — ABNORMAL LOW (ref 3.87–5.11)
RDW: 13.7 % (ref 11.5–15.5)
WBC: 8.4 10*3/uL (ref 4.0–10.5)
nRBC: 0 % (ref 0.0–0.2)

## 2023-08-11 LAB — COMPREHENSIVE METABOLIC PANEL
ALT: 18 U/L (ref 0–44)
AST: 18 U/L (ref 15–41)
Albumin: 3.8 g/dL (ref 3.5–5.0)
Alkaline Phosphatase: 54 U/L (ref 38–126)
Anion gap: 7 (ref 5–15)
BUN: 26 mg/dL — ABNORMAL HIGH (ref 8–23)
CO2: 34 mmol/L — ABNORMAL HIGH (ref 22–32)
Calcium: 9.5 mg/dL (ref 8.9–10.3)
Chloride: 96 mmol/L — ABNORMAL LOW (ref 98–111)
Creatinine, Ser: 0.9 mg/dL (ref 0.44–1.00)
GFR, Estimated: >60 mL/min (ref >60)
Glucose, Bld: 235 mg/dL — ABNORMAL HIGH (ref 70–99)
Potassium: 4 mmol/L (ref 3.5–5.1)
Sodium: 137 mmol/L (ref 135–145)
Total Bilirubin: 0.2 mg/dL (ref <1.2)
Total Protein: 7.6 g/dL (ref 6.5–8.1)

## 2023-08-11 LAB — LACTATE DEHYDROGENASE: LDH: 160 U/L (ref 98–192)

## 2023-08-11 LAB — IRON AND TIBC
Iron: 63 ug/dL (ref 28–170)
Saturation Ratios: 23 % (ref 10.4–31.8)
TIBC: 276 ug/dL (ref 250–450)
UIBC: 213 ug/dL

## 2023-08-11 LAB — VITAMIN B12: Vitamin B-12: 1939 pg/mL — ABNORMAL HIGH (ref 180–914)

## 2023-08-11 LAB — FERRITIN: Ferritin: 213 ng/mL (ref 11–307)

## 2023-08-12 LAB — KAPPA/LAMBDA LIGHT CHAINS
Kappa free light chain: 75.4 mg/L — ABNORMAL HIGH (ref 3.3–19.4)
Kappa, lambda light chain ratio: 2.74 — ABNORMAL HIGH (ref 0.26–1.65)
Lambda free light chains: 27.5 mg/L — ABNORMAL HIGH (ref 5.7–26.3)

## 2023-08-14 LAB — METHYLMALONIC ACID, SERUM: Methylmalonic Acid, Quantitative: 313 nmol/L (ref 0–378)

## 2023-08-15 LAB — PROTEIN ELECTROPHORESIS, SERUM
A/G Ratio: 1.1 (ref 0.7–1.7)
Albumin ELP: 3.8 g/dL (ref 2.9–4.4)
Alpha-1-Globulin: 0.3 g/dL (ref 0.0–0.4)
Alpha-2-Globulin: 0.7 g/dL (ref 0.4–1.0)
Beta Globulin: 0.9 g/dL (ref 0.7–1.3)
Gamma Globulin: 1.5 g/dL (ref 0.4–1.8)
Globulin, Total: 3.5 g/dL (ref 2.2–3.9)
Total Protein ELP: 7.3 g/dL (ref 6.0–8.5)

## 2023-08-16 NOTE — Progress Notes (Unsigned)
VIRTUAL VISIT via TELEPHONE NOTE [facility]   I connected with Paige Rhodes  on 08/18/23 at  8:45 AM by telephone and verified that I am speaking with the correct person using two identifiers.  Location: Patient: Home Provider: [facility]   I discussed the limitations, risks, security and privacy concerns of performing an evaluation and management service by telephone and the availability of in person appointments. I also discussed with the patient that there may be a patient responsible charge related to this service. The patient expressed understanding and agreed to proceed.  REASON FOR VISIT: Normocytic anemia  **Also follows with Cancer Center due to history of left breast cancer, which was addressed at office visit in September 2024.  Not addressed during today's phone visit.**  CURRENT THERAPY: Vitamin B12 repletion  INTERVAL HISTORY:  Paige Rhodes is contacted today for follow-up of normocytic anemia.  She was last seen by Rojelio Brenner PA-C on 05/12/2023.  At today's visit, she reports feeling fair.  She is taking vitamin B12 1000 mcg daily.  She continues to notice scant hemorrhoid bleeding with each bowel movement; she denies any severe hematochezia or melena.  She does report fatigue, but denies any pica, chest pain, headaches, lightheadedness, or syncope.  She has chronic dyspnea on exertion from her COPD and is on 3.5 L of supplemental oxygen around-the-clock.  No B symptoms or lymphadenopathy.  No new onset bone pain or neurologic changes.  She reports 25% energy and 100% appetite.  She is maintaining stable weight at this time.  REVIEW OF SYSTEMS:   Review of Systems  Constitutional:  Positive for malaise/fatigue. Negative for chills, diaphoresis, fever and weight loss.  Respiratory:  Positive for shortness of breath. Negative for cough.   Cardiovascular:  Positive for leg swelling. Negative for chest pain and palpitations.   Gastrointestinal:  Positive for blood in stool (mild) and constipation. Negative for abdominal pain, melena, nausea and vomiting.  Neurological:  Negative for dizziness and headaches.     PHYSICAL EXAM: (per limitations of virtual telephone visit)  The patient is alert and oriented x 3, exhibiting adequate mentation, good mood, and ability to speak in full sentences and execute sound judgement.  ASSESSMENT & PLAN:  1.  Normocytic anemia - She has had chronic anemia since at least 2011, with baseline Hgb around 10.0-11.5 - CBC from 04/16/2022 showed progressive normocytic anemia with Hgb 9.5/MCV 91.3 - Hematology work-up (04/16/2022): Ferritin 70, serum iron 23, iron saturation 8% Normal folate, copper, B12, MMA SPEP/immunofixation negative.  Minimal elevation in light chains discussed below. Reticulocytes 1.7% (insufficient response to degree of anemia) Evidence of inflammation with elevated ESR (36), but normal CRP. - She reports ongoing scant bleeding from hemorrhoids - Unable to tolerate iron supplement due to severe constipation that worsens her hemorrhoid bleeding. - Most recent IV Feraheme x 1 on 12/23/2022 - Most recent labs (08/11/2023):  Hgb 10.9/MCV 98.9, normal WBC and platelets. Creatinine 0.90/GFR >60.  Normal LDH. Vitamin B12 1939, with MMA normalized at 313 (previously elevated at 418) Ferritin 213, iron saturation 23%. - DIFFERENTIAL DIAGNOSIS favors anemia secondary to functional iron deficiency in the setting of chronic disease as well as iron deficiency from rectal bleeding.  Appears to have some element of functional B12 deficiency based on elevated MMA.  Differential diagnosis would also include infiltrative bone marrow disorder, but more invasive testing has been deferred due to advanced age and other comorbidities.   - PLAN:  Continue vitamin B12 1000 mcg but decrease to EVERY OTHER DAY.   - No indication for IV iron at this time. - Repeat labs (CBC/D, CMP, B12, MMA,  ferritin, iron/TIBC) in 4 months followed by PHONE  visit - Patient would like to hold off on GI referral for the time being, she is aware that they offer hemorrhoid banding if her hematochezia should worsen. - If she has any severe unexplained worsening of anemia, would consider bone marrow biopsy.  However, due to advanced age and other comorbidities more conservative monitoring and treatment is preferred.    2.  Elevated free light chains - Incidental finding of elevated FLC ratio during workup of anemia - Labs (04/16/2022): SPEP negative for M spike.  Immunofixation shows polyclonal immunoglobulins.  Elevated kappa free light chain 70.7, elevated lambda 31.6, elevated ratio 2.24. - Most recent labs (08/11/2023): Immunofixation PENDING  SPEP negative for M spike. Elevated FLC ratio 2.74 (kappa 75.4, lambda 27.5) -  No new onset bone pain or neurologic changes.   - PLAN: We will check 24-hour urine at her next office visit. - If normal, will recheck MGUS panel in 1 year.    3.   Leukocytosis - She has intermittent leukocytosis in the setting of daily Breo Ellipta (steroid containing inhaler) and frequent UTIs - Reports frequent UTIs over the past year, but this has improved after being placed on suppressive antibiotics by urology - No B symptoms or lymphadenopathy. - Most recent WBC/differential was normal - PLAN: Suspect reactive leukocytosis in the setting of inhaled steroids.  We will continue to monitor with repeat CBC and consider MPN work-up in the future if she has significant leukocytosis.  4.  Osteoporosis - Not fully addressed during today's visit.  See full office note from 05/12/2023. - PLAN: Continue Prolia and CMP every 6 months (next due February 2025) - Repeat bone density scan in August 2026.   -- Vitamin D decreased to every other day dosing  5.  Stage IIa infiltrating ductal carcinoma (HER2 amplified) of the left breast  - Not fully addressed at today's visit.  See full  office note from 05/12/2023. - PLAN: At last visit, patient was adamant that she would like to STOP receiving annual mammograms at this point in time.  We discussed that stopping mammograms would make it more difficult to identify any recurrent breast cancer in a timely manner, and she is agreeable with this risk. - Continue annual in person follow-ups and labs, next due September 2025.    PLAN SUMMARY: >> CMP + PROLIA injection in February 2025 >> Labs in 4 months = CBC/D, CMP, B12, MMA, ferritin, iron/TIBC >> PHONE visit in 4 months  **After next phone visit: >> Labs and office visit in September 2025     I discussed the assessment and treatment plan with the patient. The patient was provided an opportunity to ask questions and all were answered. The patient agreed with the plan and demonstrated an understanding of the instructions.   The patient was advised to call back or seek an in-person evaluation if the symptoms worsen or if the condition fails to improve as anticipated.  I provided 22 minutes of non-face-to-face time during this encounter.  Carnella Guadalajara, PA-C 08/18/23 9:13 AM

## 2023-08-18 ENCOUNTER — Encounter: Payer: Self-pay | Admitting: Physician Assistant

## 2023-08-18 ENCOUNTER — Inpatient Hospital Stay: Payer: Medicare Other | Attending: Hematology | Admitting: Physician Assistant

## 2023-08-18 DIAGNOSIS — M810 Age-related osteoporosis without current pathological fracture: Secondary | ICD-10-CM

## 2023-08-18 DIAGNOSIS — D72829 Elevated white blood cell count, unspecified: Secondary | ICD-10-CM | POA: Diagnosis not present

## 2023-08-18 DIAGNOSIS — E538 Deficiency of other specified B group vitamins: Secondary | ICD-10-CM

## 2023-08-18 DIAGNOSIS — D649 Anemia, unspecified: Secondary | ICD-10-CM | POA: Diagnosis not present

## 2023-08-18 DIAGNOSIS — D500 Iron deficiency anemia secondary to blood loss (chronic): Secondary | ICD-10-CM

## 2023-08-21 LAB — IMMUNOFIXATION ELECTROPHORESIS
IgA: 188 mg/dL (ref 64–422)
IgG (Immunoglobin G), Serum: 1540 mg/dL (ref 586–1602)
IgM (Immunoglobulin M), Srm: 134 mg/dL (ref 26–217)
Total Protein ELP: 7.1 g/dL (ref 6.0–8.5)

## 2023-10-09 ENCOUNTER — Encounter (HOSPITAL_COMMUNITY): Payer: Self-pay | Admitting: Hematology

## 2023-10-23 ENCOUNTER — Inpatient Hospital Stay: Payer: Medicare Other

## 2023-12-10 ENCOUNTER — Inpatient Hospital Stay: Payer: Medicare Other

## 2023-12-11 ENCOUNTER — Other Ambulatory Visit: Payer: Self-pay | Admitting: Urology

## 2023-12-11 DIAGNOSIS — N952 Postmenopausal atrophic vaginitis: Secondary | ICD-10-CM

## 2023-12-17 ENCOUNTER — Inpatient Hospital Stay

## 2023-12-17 ENCOUNTER — Inpatient Hospital Stay: Payer: Medicare Other | Admitting: Physician Assistant

## 2023-12-17 ENCOUNTER — Inpatient Hospital Stay: Attending: Hematology

## 2023-12-17 VITALS — BP 147/57 | HR 56 | Resp 16

## 2023-12-17 DIAGNOSIS — D649 Anemia, unspecified: Secondary | ICD-10-CM | POA: Insufficient documentation

## 2023-12-17 DIAGNOSIS — M810 Age-related osteoporosis without current pathological fracture: Secondary | ICD-10-CM | POA: Diagnosis not present

## 2023-12-17 DIAGNOSIS — D500 Iron deficiency anemia secondary to blood loss (chronic): Secondary | ICD-10-CM

## 2023-12-17 DIAGNOSIS — Z79899 Other long term (current) drug therapy: Secondary | ICD-10-CM | POA: Insufficient documentation

## 2023-12-17 DIAGNOSIS — E538 Deficiency of other specified B group vitamins: Secondary | ICD-10-CM

## 2023-12-17 LAB — COMPREHENSIVE METABOLIC PANEL WITH GFR
ALT: 17 U/L (ref 0–44)
AST: 16 U/L (ref 15–41)
Albumin: 3.6 g/dL (ref 3.5–5.0)
Alkaline Phosphatase: 50 U/L (ref 38–126)
Anion gap: 11 (ref 5–15)
BUN: 38 mg/dL — ABNORMAL HIGH (ref 8–23)
CO2: 36 mmol/L — ABNORMAL HIGH (ref 22–32)
Calcium: 10 mg/dL (ref 8.9–10.3)
Chloride: 93 mmol/L — ABNORMAL LOW (ref 98–111)
Creatinine, Ser: 1.22 mg/dL — ABNORMAL HIGH (ref 0.44–1.00)
GFR, Estimated: 42 mL/min — ABNORMAL LOW (ref >60)
Glucose, Bld: 175 mg/dL — ABNORMAL HIGH (ref 70–99)
Potassium: 4 mmol/L (ref 3.5–5.1)
Sodium: 140 mmol/L (ref 135–145)
Total Bilirubin: 0.4 mg/dL (ref 0.0–1.2)
Total Protein: 7.3 g/dL (ref 6.5–8.1)

## 2023-12-17 LAB — IRON AND TIBC
Iron: 66 ug/dL (ref 28–170)
Saturation Ratios: 24 % (ref 10.4–31.8)
TIBC: 270 ug/dL (ref 250–450)
UIBC: 204 ug/dL

## 2023-12-17 LAB — CBC WITH DIFFERENTIAL/PLATELET
Abs Immature Granulocytes: 0.04 10*3/uL (ref 0.00–0.07)
Basophils Absolute: 0 10*3/uL (ref 0.0–0.1)
Basophils Relative: 0 %
Eosinophils Absolute: 0.1 10*3/uL (ref 0.0–0.5)
Eosinophils Relative: 2 %
HCT: 32.4 % — ABNORMAL LOW (ref 36.0–46.0)
Hemoglobin: 10.1 g/dL — ABNORMAL LOW (ref 12.0–15.0)
Immature Granulocytes: 1 %
Lymphocytes Relative: 18 %
Lymphs Abs: 1.3 10*3/uL (ref 0.7–4.0)
MCH: 30.8 pg (ref 26.0–34.0)
MCHC: 31.2 g/dL (ref 30.0–36.0)
MCV: 98.8 fL (ref 80.0–100.0)
Monocytes Absolute: 0.3 10*3/uL (ref 0.1–1.0)
Monocytes Relative: 4 %
Neutro Abs: 5.6 10*3/uL (ref 1.7–7.7)
Neutrophils Relative %: 75 %
Platelets: 219 10*3/uL (ref 150–400)
RBC: 3.28 MIL/uL — ABNORMAL LOW (ref 3.87–5.11)
RDW: 13.6 % (ref 11.5–15.5)
WBC: 7.3 10*3/uL (ref 4.0–10.5)
nRBC: 0 % (ref 0.0–0.2)

## 2023-12-17 LAB — VITAMIN B12: Vitamin B-12: 1597 pg/mL — ABNORMAL HIGH (ref 180–914)

## 2023-12-17 LAB — FERRITIN: Ferritin: 184 ng/mL (ref 11–307)

## 2023-12-17 MED ORDER — DENOSUMAB 60 MG/ML ~~LOC~~ SOSY
60.0000 mg | PREFILLED_SYRINGE | Freq: Once | SUBCUTANEOUS | Status: AC
  Administered 2023-12-17: 60 mg via SUBCUTANEOUS
  Filled 2023-12-17: qty 1

## 2023-12-17 NOTE — Progress Notes (Signed)
Patient taking calcium as directed. Denied tooth, jaw, and leg pain. No recent or upcoming dental visits. Labs reviewed. Patient tolerated injection with no complaints voiced. See MAR for details. Patient stable during and after injection. Site clean and dry with no bruising or swelling noted. Band aid applied. Vss with discharge and left in satisfactory condition with no s/s of distress.  

## 2023-12-17 NOTE — Patient Instructions (Signed)
 [facility] [place] - A DEPT OF [facility]  Discharge Instructions: Thank you for choosing [facility] to provide your oncology and hematology care.  If you have a lab appointment with the Cancer Center - please note that after April 8th, 2024, all labs will be drawn in the cancer center.  You do not have to check in or register with the main entrance as you have in the past but will complete your check-in in the cancer center.  Wear comfortable clothing and clothing appropriate for easy access to any Portacath or PICC line.   We strive to give you quality time with your provider. You may need to reschedule your appointment if you arrive late (15 or more minutes).  Arriving late affects you and other patients whose appointments are after yours.  Also, if you miss three or more appointments without notifying the office, you may be dismissed from the clinic at the provider's discretion.      For prescription refill requests, have your pharmacy contact our office and allow 72 hours for refills to be completed.    Today you received the following Prolia, return as scheduled.   To help prevent nausea and vomiting after your treatment, we encourage you to take your nausea medication as directed.  BELOW ARE SYMPTOMS THAT SHOULD BE REPORTED IMMEDIATELY: *FEVER GREATER THAN 100.4 F (38 C) OR HIGHER *CHILLS OR SWEATING *NAUSEA AND VOMITING THAT IS NOT CONTROLLED WITH YOUR NAUSEA MEDICATION *UNUSUAL SHORTNESS OF BREATH *UNUSUAL BRUISING OR BLEEDING *URINARY PROBLEMS (pain or burning when urinating, or frequent urination) *BOWEL PROBLEMS (unusual diarrhea, constipation, pain near the anus) TENDERNESS IN MOUTH AND THROAT WITH OR WITHOUT PRESENCE OF ULCERS (sore throat, sores in mouth, or a toothache) UNUSUAL RASH, SWELLING OR PAIN  UNUSUAL VAGINAL DISCHARGE OR ITCHING   Items with * indicate a potential emergency and should be followed up as soon as possible or  go to the Emergency Department if any problems should occur.  Please show the CHEMOTHERAPY ALERT CARD or IMMUNOTHERAPY ALERT CARD at check-in to the Emergency Department and triage nurse.  Should you have questions after your visit or need to cancel or reschedule your appointment, please contact [facility] [place] - A DEPT OF [facility] [phone]  and follow the prompts.  Office hours are 8:00 a.m. to 4:30 p.m. Monday - Friday. Please note that voicemails left after 4:00 p.m. may not be returned until the following business day.  We are closed weekends and major holidays. You have access to a nurse at all times for urgent questions. Please call the main number to the clinic [phone] and follow the prompts.  For any non-urgent questions, you may also contact your provider using MyChart. We now offer e-Visits for anyone 59 and older to request care online for non-urgent symptoms. For details visit [email or web address].   Also download the MyChart app! Go to the app store, search "MyChart", open the app, select [place], and log in with your MyChart username and password.

## 2023-12-21 LAB — METHYLMALONIC ACID, SERUM: Methylmalonic Acid, Quantitative: 396 nmol/L — ABNORMAL HIGH (ref 0–378)

## 2023-12-22 ENCOUNTER — Encounter (HOSPITAL_COMMUNITY): Payer: Self-pay | Admitting: Hematology

## 2023-12-23 NOTE — Progress Notes (Unsigned)
 VIRTUAL VISIT via TELEPHONE NOTE [facility]   I connected with Paige Rhodes  on 12/24/23 at  12:08 PM by telephone and verified that I am speaking with the correct person using two identifiers.  Location: Patient: Home Provider: [facility]   I discussed the limitations, risks, security and privacy concerns of performing an evaluation and management service by telephone and the availability of in person appointments. I also discussed with the patient that there may be a patient responsible charge related to this service. The patient expressed understanding and agreed to proceed.  REASON FOR VISIT: Normocytic anemia **Also follows with Cancer Center due to history of left breast cancer, which was addressed at office visit in September 2024.  Not addressed during today's phone visit.**  CURRENT THERAPY: Vitamin B12 repletion  INTERVAL HISTORY:  Paige Rhodes is contacted today for follow-up of normocytic anemia.  She was last evaluated via telemedicine visit by Sheril Dines PA-C on 08/18/2023.  At today's visit, she reports feeling fairly well.   She is taking vitamin B12 1000 mcg every other day.  She reports improvement in her hemorrhoid bleeding; she denies any severe hematochezia or melena.  She does report fatigue, but denies any pica, chest pain, headaches, lightheadedness, or syncope.  She has chronic dyspnea on exertion from her COPD and is on 3.5 L of supplemental oxygen  around-the-clock.  No B symptoms or lymphadenopathy.  No new onset bone pain or neurologic changes.  She reports little to no energy and 100% appetite.  She is maintaining stable weight at this time.  REVIEW OF SYSTEMS:   Review of Systems  Constitutional:  Positive for malaise/fatigue. Negative for chills, diaphoresis, fever and weight loss.  Respiratory:  Positive for shortness of breath. Negative for cough.   Cardiovascular:  Positive for leg swelling. Negative for chest  pain and palpitations.  Gastrointestinal:  Positive for blood in stool (mild) and constipation. Negative for abdominal pain, melena, nausea and vomiting.  Neurological:  Negative for dizziness and headaches.     PHYSICAL EXAM: (per limitations of virtual telephone visit)  The patient is alert and oriented x 3, exhibiting adequate mentation, good mood, and ability to speak in full sentences and execute sound judgement.  ASSESSMENT & PLAN:  1.  Normocytic anemia - She has had chronic anemia since at least 2011, with baseline Hgb around 10.0-11.5 - CBC from 04/16/2022 showed progressive normocytic anemia with Hgb 9.5/MCV 91.3 - Hematology work-up (04/16/2022): Ferritin 70, serum iron 23, iron saturation 8% Normal folate, copper , B12, MMA SPEP/immunofixation negative.  Minimal elevation in light chains discussed below. Reticulocytes 1.7% (insufficient response to degree of anemia) Evidence of inflammation with elevated ESR (36), but normal CRP. - She reports ongoing scant bleeding from hemorrhoids, but improved from previously - Unable to tolerate iron supplement due to severe constipation that worsens her hemorrhoid bleeding. - She takes vitamin B12 1000 mcg every other day  - Most recent IV Feraheme x 1 on 12/23/2022 - Most recent labs (12/17/2023):  Hgb 10.1/MCV 98.8, normal WBC and platelets. Creatinine 1.22/GFR > 42 (CKD stage IIIa/b). Vitamin B12 1597, MMA mildly elevated at 396 Ferritin 184, iron saturation 24% - DIFFERENTIAL DIAGNOSIS favors anemia secondary to functional iron deficiency in the setting of chronic disease, early CKD, and iron deficiency from rectal bleeding.  Appears to have some element of functional B12 deficiency based on elevated MMA.  Differential diagnosis would also include infiltrative bone marrow disorder, but more  invasive testing has been deferred due to advanced age and other comorbidities.   - PLAN: Continue vitamin B12 1000 mcg EVERY OTHER DAY.   - No  indication for IV iron at this time. - Repeat labs (CBC/D, CMP, B12, MMA, ferritin, iron/TIBC) in September 2025 followed by OFFICE visit - If she has any severe unexplained worsening of anemia, would consider bone marrow biopsy.  However, due to advanced age and other comorbidities more conservative monitoring and treatment is preferred.    2.  Elevated free light chains - Incidental finding of elevated FLC ratio during workup of anemia - Labs (04/16/2022): SPEP negative for M spike.  Immunofixation shows polyclonal immunoglobulins.  Elevated kappa free light chain 70.7, elevated lambda 31.6, elevated ratio 2.24. - Most recent labs (08/11/2023): Immunofixation negative SPEP negative for M spike. Elevated FLC ratio 2.74 (kappa 75.4, lambda 27.5) -  No new onset bone pain or neurologic changes.   - PLAN: We will check 24-hour urine and repeat MGUS panel around December 2024   3.   Leukocytosis - She has intermittent leukocytosis in the setting of daily Breo Ellipta  (steroid containing inhaler) and frequent UTIs - Reports frequent UTIs over the past year, but this has improved after being placed on suppressive antibiotics by urology - No B symptoms or lymphadenopathy. - Most recent WBC/differential was normal - PLAN: Suspect reactive leukocytosis in the setting of inhaled steroids.  We will continue to monitor with repeat CBC and consider MPN work-up in the future if she has significant leukocytosis.  4.  Osteoporosis - Not fully addressed during today's visit.  See full office note from 05/12/2023. - PLAN: Continue Prolia  and CMP every 6 months (next due October 2025) - Repeat bone density scan in August 2026.   -- Vitamin D  decreased to every other day dosing  5.  Stage IIa infiltrating ductal carcinoma (HER2 amplified) of the left breast  - Not fully addressed at today's visit.  See full office note from 05/12/2023. - PLAN: At last visit, patient was adamant that she would like to STOP  receiving annual mammograms at this point in time.  We discussed that stopping mammograms would make it more difficult to identify any recurrent breast cancer in a timely manner, and she is agreeable with this risk. - Continue annual in person follow-ups and labs, next due September 2025.    PLAN SUMMARY: >> Labs in 6 months = CBC/D, CMP, B12, MMA, ferritin, iron/TIBC >> OFFICE visit in 6 months (1 week after labs) >> PROLIA  injection in October 2025     I discussed the assessment and treatment plan with the patient. The patient was provided an opportunity to ask questions and all were answered. The patient agreed with the plan and demonstrated an understanding of the instructions.   The patient was advised to call back or seek an in-person evaluation if the symptoms worsen or if the condition fails to improve as anticipated.  I provided 18 minutes of non-face-to-face time during this encounter, including >10 minutes of medical discussion.  Sonnie Dusky, PA-C 12/24/23 3:25 PM

## 2023-12-24 ENCOUNTER — Inpatient Hospital Stay (HOSPITAL_BASED_OUTPATIENT_CLINIC_OR_DEPARTMENT_OTHER): Admitting: Physician Assistant

## 2023-12-24 ENCOUNTER — Encounter: Payer: Self-pay | Admitting: Physician Assistant

## 2023-12-24 DIAGNOSIS — M810 Age-related osteoporosis without current pathological fracture: Secondary | ICD-10-CM

## 2023-12-24 DIAGNOSIS — R799 Abnormal finding of blood chemistry, unspecified: Secondary | ICD-10-CM | POA: Diagnosis not present

## 2023-12-24 DIAGNOSIS — D649 Anemia, unspecified: Secondary | ICD-10-CM | POA: Diagnosis not present

## 2023-12-24 DIAGNOSIS — C50912 Malignant neoplasm of unspecified site of left female breast: Secondary | ICD-10-CM

## 2023-12-24 DIAGNOSIS — E538 Deficiency of other specified B group vitamins: Secondary | ICD-10-CM

## 2023-12-24 DIAGNOSIS — D500 Iron deficiency anemia secondary to blood loss (chronic): Secondary | ICD-10-CM

## 2024-03-02 ENCOUNTER — Inpatient Hospital Stay (HOSPITAL_COMMUNITY)
Admission: EM | Admit: 2024-03-02 | Discharge: 2024-03-11 | DRG: 348 | Disposition: A | Discharge status: Hospice | Attending: Family Medicine | Admitting: Family Medicine

## 2024-03-02 ENCOUNTER — Encounter (HOSPITAL_COMMUNITY): Payer: Self-pay | Admitting: Hematology

## 2024-03-02 ENCOUNTER — Encounter (HOSPITAL_COMMUNITY): Payer: Self-pay

## 2024-03-02 ENCOUNTER — Other Ambulatory Visit: Payer: Self-pay

## 2024-03-02 ENCOUNTER — Emergency Department (HOSPITAL_COMMUNITY)

## 2024-03-02 DIAGNOSIS — M109 Gout, unspecified: Secondary | ICD-10-CM | POA: Diagnosis present

## 2024-03-02 DIAGNOSIS — Z841 Family history of disorders of kidney and ureter: Secondary | ICD-10-CM

## 2024-03-02 DIAGNOSIS — H409 Unspecified glaucoma: Secondary | ICD-10-CM | POA: Diagnosis present

## 2024-03-02 DIAGNOSIS — E782 Mixed hyperlipidemia: Secondary | ICD-10-CM | POA: Diagnosis not present

## 2024-03-02 DIAGNOSIS — Z885 Allergy status to narcotic agent status: Secondary | ICD-10-CM

## 2024-03-02 DIAGNOSIS — N1831 Chronic kidney disease, stage 3a: Secondary | ICD-10-CM | POA: Diagnosis present

## 2024-03-02 DIAGNOSIS — Z888 Allergy status to other drugs, medicaments and biological substances status: Secondary | ICD-10-CM

## 2024-03-02 DIAGNOSIS — K921 Melena: Principal | ICD-10-CM | POA: Diagnosis present

## 2024-03-02 DIAGNOSIS — Z79890 Hormone replacement therapy (postmenopausal): Secondary | ICD-10-CM

## 2024-03-02 DIAGNOSIS — I10 Essential (primary) hypertension: Secondary | ICD-10-CM | POA: Diagnosis not present

## 2024-03-02 DIAGNOSIS — Z7982 Long term (current) use of aspirin: Secondary | ICD-10-CM

## 2024-03-02 DIAGNOSIS — D125 Benign neoplasm of sigmoid colon: Secondary | ICD-10-CM | POA: Diagnosis present

## 2024-03-02 DIAGNOSIS — Z79899 Other long term (current) drug therapy: Secondary | ICD-10-CM

## 2024-03-02 DIAGNOSIS — E039 Hypothyroidism, unspecified: Secondary | ICD-10-CM | POA: Diagnosis not present

## 2024-03-02 DIAGNOSIS — E66811 Obesity, class 1: Secondary | ICD-10-CM | POA: Diagnosis present

## 2024-03-02 DIAGNOSIS — J449 Chronic obstructive pulmonary disease, unspecified: Secondary | ICD-10-CM | POA: Diagnosis not present

## 2024-03-02 DIAGNOSIS — J9611 Chronic respiratory failure with hypoxia: Secondary | ICD-10-CM | POA: Diagnosis not present

## 2024-03-02 DIAGNOSIS — Z8249 Family history of ischemic heart disease and other diseases of the circulatory system: Secondary | ICD-10-CM

## 2024-03-02 DIAGNOSIS — R531 Weakness: Secondary | ICD-10-CM | POA: Diagnosis present

## 2024-03-02 DIAGNOSIS — M810 Age-related osteoporosis without current pathological fracture: Secondary | ICD-10-CM | POA: Diagnosis present

## 2024-03-02 DIAGNOSIS — Z96649 Presence of unspecified artificial hip joint: Secondary | ICD-10-CM | POA: Diagnosis present

## 2024-03-02 DIAGNOSIS — K5909 Other constipation: Secondary | ICD-10-CM | POA: Diagnosis present

## 2024-03-02 DIAGNOSIS — Z515 Encounter for palliative care: Secondary | ICD-10-CM

## 2024-03-02 DIAGNOSIS — Z853 Personal history of malignant neoplasm of breast: Secondary | ICD-10-CM

## 2024-03-02 DIAGNOSIS — Z6832 Body mass index (BMI) 32.0-32.9, adult: Secondary | ICD-10-CM

## 2024-03-02 DIAGNOSIS — I129 Hypertensive chronic kidney disease with stage 1 through stage 4 chronic kidney disease, or unspecified chronic kidney disease: Secondary | ICD-10-CM | POA: Diagnosis present

## 2024-03-02 DIAGNOSIS — K5730 Diverticulosis of large intestine without perforation or abscess without bleeding: Secondary | ICD-10-CM | POA: Diagnosis present

## 2024-03-02 DIAGNOSIS — D649 Anemia, unspecified: Secondary | ICD-10-CM | POA: Diagnosis present

## 2024-03-02 DIAGNOSIS — Z880 Allergy status to penicillin: Secondary | ICD-10-CM

## 2024-03-02 DIAGNOSIS — E1165 Type 2 diabetes mellitus with hyperglycemia: Secondary | ICD-10-CM

## 2024-03-02 DIAGNOSIS — D123 Benign neoplasm of transverse colon: Secondary | ICD-10-CM | POA: Diagnosis present

## 2024-03-02 DIAGNOSIS — K219 Gastro-esophageal reflux disease without esophagitis: Secondary | ICD-10-CM | POA: Diagnosis present

## 2024-03-02 DIAGNOSIS — Z87891 Personal history of nicotine dependence: Secondary | ICD-10-CM

## 2024-03-02 DIAGNOSIS — K6289 Other specified diseases of anus and rectum: Secondary | ICD-10-CM

## 2024-03-02 DIAGNOSIS — E1122 Type 2 diabetes mellitus with diabetic chronic kidney disease: Secondary | ICD-10-CM | POA: Diagnosis present

## 2024-03-02 DIAGNOSIS — D128 Benign neoplasm of rectum: Secondary | ICD-10-CM | POA: Diagnosis present

## 2024-03-02 DIAGNOSIS — K625 Hemorrhage of anus and rectum: Principal | ICD-10-CM | POA: Diagnosis present

## 2024-03-02 DIAGNOSIS — D638 Anemia in other chronic diseases classified elsewhere: Secondary | ICD-10-CM | POA: Diagnosis present

## 2024-03-02 DIAGNOSIS — K649 Unspecified hemorrhoids: Secondary | ICD-10-CM | POA: Diagnosis present

## 2024-03-02 LAB — CBC WITH DIFFERENTIAL/PLATELET
Abs Immature Granulocytes: 0.02 10*3/uL (ref 0.00–0.07)
Basophils Absolute: 0 10*3/uL (ref 0.0–0.1)
Basophils Relative: 0 %
Eosinophils Absolute: 0.1 10*3/uL (ref 0.0–0.5)
Eosinophils Relative: 2 %
HCT: 31.5 % — ABNORMAL LOW (ref 36.0–46.0)
Hemoglobin: 10.1 g/dL — ABNORMAL LOW (ref 12.0–15.0)
Immature Granulocytes: 0 %
Lymphocytes Relative: 16 %
Lymphs Abs: 1.1 10*3/uL (ref 0.7–4.0)
MCH: 31.9 pg (ref 26.0–34.0)
MCHC: 32.1 g/dL (ref 30.0–36.0)
MCV: 99.4 fL (ref 80.0–100.0)
Monocytes Absolute: 0.4 10*3/uL (ref 0.1–1.0)
Monocytes Relative: 5 %
Neutro Abs: 5.4 10*3/uL (ref 1.7–7.7)
Neutrophils Relative %: 77 %
Platelets: 169 10*3/uL (ref 150–400)
RBC: 3.17 MIL/uL — ABNORMAL LOW (ref 3.87–5.11)
RDW: 13.3 % (ref 11.5–15.5)
WBC: 7 10*3/uL (ref 4.0–10.5)
nRBC: 0 % (ref 0.0–0.2)

## 2024-03-02 LAB — COMPREHENSIVE METABOLIC PANEL WITH GFR
ALT: 17 U/L (ref 0–44)
AST: 18 U/L (ref 15–41)
Albumin: 3.5 g/dL (ref 3.5–5.0)
Alkaline Phosphatase: 57 U/L (ref 38–126)
Anion gap: 7 (ref 5–15)
BUN: 27 mg/dL — ABNORMAL HIGH (ref 8–23)
CO2: 39 mmol/L — ABNORMAL HIGH (ref 22–32)
Calcium: 10.1 mg/dL (ref 8.9–10.3)
Chloride: 94 mmol/L — ABNORMAL LOW (ref 98–111)
Creatinine, Ser: 0.96 mg/dL (ref 0.44–1.00)
GFR, Estimated: 56 mL/min — ABNORMAL LOW (ref >60)
Glucose, Bld: 215 mg/dL — ABNORMAL HIGH (ref 70–99)
Potassium: 4.2 mmol/L (ref 3.5–5.1)
Sodium: 140 mmol/L (ref 135–145)
Total Bilirubin: 0.4 mg/dL (ref 0.0–1.2)
Total Protein: 6.8 g/dL (ref 6.5–8.1)

## 2024-03-02 LAB — POC OCCULT BLOOD, ED

## 2024-03-02 LAB — TYPE AND SCREEN
ABO/RH(D): A NEG
Antibody Screen: NEGATIVE

## 2024-03-02 MED ORDER — IPRATROPIUM-ALBUTEROL 0.5-2.5 (3) MG/3ML IN SOLN
3.0000 mL | Freq: Once | RESPIRATORY_TRACT | Status: AC
  Administered 2024-03-02: 3 mL via RESPIRATORY_TRACT
  Filled 2024-03-02: qty 3

## 2024-03-02 MED ORDER — IOHEXOL 300 MG/ML  SOLN
100.0000 mL | Freq: Once | INTRAMUSCULAR | Status: AC | PRN
Start: 2024-03-02 — End: 2024-03-02
  Administered 2024-03-02: 100 mL via INTRAVENOUS

## 2024-03-02 NOTE — ED Triage Notes (Signed)
 Pt arrived via POV from home c/o 2 episodes of diarrhea today with hematochezia. Pt denies abdominal pain at this time. Pt presents on 4L Nasal Cannula O2 at baseline.

## 2024-03-02 NOTE — H&P (Incomplete)
 History and Physical    Patient: Paige Rhodes DOB: [birth year] DOA: 03/02/2024 DOS: the patient was seen and examined on 03/03/2024 PCP: [name], MD  Patient coming from: Home  Chief Complaint:  Chief Complaint  Patient presents with   Hematochezia   HPI: Paige Rhodes is a 89 y.o. female with medical history significant of hypertension, hyperlipidemia, T2DM, chronic respiratory failure with hypoxia on 4L of oxygen  at baseline, COPD, hypothyroidism, GERD, breast CA s/p lumpectomy, stage IIIa CKD who presents to the emergency department accompanied by daughter due to rectal bleed.  Patient endorsed 3 episodes of bowel movement with noted blood in the commode.  She denies abdominal pain, fever, chills.  ED Course:  BP 151/69, other vital signs are within normal range.  Workup in the ED showed normocytic anemia, BMP was normal except for chloride of 94, bicarb 39, blood glucose 215, BUN 27, GFR 56.  POC occult blood was positive. CT abdomen and pelvis with contrast showed no acute intra-abdominal or pelvic pathology.  Sigmoid diverticulosis.  No bowel obstruction She was provided with breathing treatment in the ED.  TRH was asked to admit patient  Review of Systems: Review of systems as noted in the HPI. All other systems reviewed and are negative.   Past Medical History:  Diagnosis Date   Breast cancer (HCC)    Chronic renal disease, stage 2, mildly decreased glomerular filtration rate between 60-89 mL/min/1.73 square meter 10/31/2014   COPD (chronic obstructive pulmonary disease) (HCC)    Diabetes mellitus    GERD (gastroesophageal reflux disease)    occasional   Glaucoma    Gout attack june 2013   left foot   Hard of hearing    Hyperlipidemia    Hypertension    Hypothyroidism    Infiltrating ductal carcinoma of left breast 03/05/2011   On home oxygen  therapy    2 LPM   Osteoporosis    Port catheter in place 01/15/2012   SOB (shortness of breath)    Thyroid   disease    Past Surgical History:  Procedure Laterality Date   ANKLE SURGERY  1998   APPENDECTOMY  1955   BREAST LUMPECTOMY  2011   left   CATARACT EXTRACTION W/PHACO  09/03/2012   Procedure: CATARACT EXTRACTION PHACO AND INTRAOCULAR LENS PLACEMENT (IOC);  Surgeon: Cherene Mania, MD;  Location: [facility];  Service: Ophthalmology;  Laterality: Right;  CDE: 15.26   CATARACT EXTRACTION W/PHACO  09/28/2012   Procedure: CATARACT EXTRACTION PHACO AND INTRAOCULAR LENS PLACEMENT (IOC);  Surgeon: Cherene Mania, MD;  Location: [facility];  Service: Ophthalmology;  Laterality: Left;  CDE:  21.60   CHOLECYSTECTOMY  1990   INGUINAL HERNIA REPAIR Left 11/14/2022   Procedure: INCARCERATED INGUINAL HERNIA REPAIR WITH MESH;  Surgeon: Evonnie Dorothyann LABOR, DO;  Location: [facility];  Service: General;  Laterality: Left;   OPEN REDUCTION INTERNAL FIXATION (ORIF) DISTAL RADIAL FRACTURE Left 07/01/2019   Procedure: OPEN REDUCTION INTERNAL FIXATION (ORIF) LEFT DISTAL RADIAL FRACTURE;  Surgeon: Margrette Taft BRAVO, MD;  Location: [facility];  Service: Orthopedics;  Laterality: Left;   PORT-A-CATH REMOVAL N/A 04/26/2015   Procedure: REMOVAL PORT-A-CATH;  Surgeon: Oneil Budge, MD;  Location: [facility];  Service: General;  Laterality: N/A;   PORTACATH PLACEMENT  05/23/10   THYROID  SURGERY  1994   TONSILLECTOMY     TOTAL HIP ARTHROPLASTY  1996    Social History:  reports that she quit smoking about 32 years ago. Her smoking use included cigarettes.  She started smoking about 62 years ago. She has a 90 pack-year smoking history. She has never used smokeless tobacco. She reports that she does not drink alcohol and does not use drugs.   Allergies  Allergen Reactions   Penicillins Anaphylaxis    Edema of throat   Hctz [Hydrochlorothiazide] Other (See Comments)    Gout    Lotrel [Amlodipine  Besy-Benazepril Hcl] Other (See Comments)    Facial muscles draw and break out in whilps   Morphine  And Codeine  Itching    Family History  Problem  Relation Age of Onset   Kidney disease Mother    Heart disease Father    Hypertension Brother    Cancer Sister        breast and lung     Prior to Admission medications   Medication Sig Start Date End Date Taking? Authorizing Provider  acetaminophen  (TYLENOL ) 500 MG tablet Take 2 tablets (1,000 mg total) by mouth every 6 (six) hours as needed for mild pain, moderate pain, fever or headache. 11/15/22  Yes Bryn Bernardino NOVAK, MD  albuterol  (VENTOLIN  HFA) 108 (90 Base) MCG/ACT inhaler Inhale 2 puffs into the lungs every 6 (six) hours as needed for wheezing or shortness of breath. 12/21/20  Yes [provider]  amLODipine  (NORVASC ) 5 MG tablet Take 5 mg by mouth daily.    Yes [provider]  Ascorbic Acid (VITAMIN C PO) Take 1 tablet by mouth daily.   Yes [provider]  aspirin  EC 81 MG tablet Take 81 mg by mouth at bedtime.   Yes [provider]  atorvastatin  (LIPITOR) 10 MG tablet Take 10 mg by mouth daily. 08/18/19  Yes [provider]  Calcium -Magnesium -Vitamin D  (CALCIUM  500 PO) Take 1 tablet by mouth every other day.   Yes [provider]  carvedilol  (COREG ) 25 MG tablet Take 25 mg by mouth 2 (two) times daily with a meal.     Yes [provider]  estradiol  (ESTRACE ) 0.1 MG/GM vaginal cream INSERT 1/2 INCH IN APPLICATOR VAGINALLY TWICE WEEKLY. Patient taking differently: Place 1 Applicatorful vaginally 2 (two) times a week. INSERT 1/2 INCH IN APPLICATOR VAGINALLY TWICE WEEKLY. 12/11/23  Yes Dahlstedt, Garnette, MD  furosemide  (LASIX ) 20 MG tablet Take 20 mg by mouth daily as needed for fluid.   Yes [provider]  GOODSENSE CLEARLAX 17 GM/SCOOP powder Take 17 g by mouth daily as needed for mild constipation. 02/04/23  Yes [provider]  hydrocortisone (ANUSOL-HC) 2.5 % rectal cream Apply 1 Application topically 2 (two) times daily. 08/19/22  Yes [provider]  ipratropium-albuterol  (DUONEB) 0.5-2.5 (3)  MG/3ML SOLN Take 3 mLs by nebulization every 4 (four) hours as needed (wheezing, shortness of breath). 02/12/24  Yes [provider]  ketoconazole (NIZORAL) 2 % cream Apply 1 Application topically 2 (two) times daily. 02/19/24  Yes [provider]  lactobacillus acidophilus (BACID) TABS tablet Take 1 tablet by mouth daily.   Yes [provider]  levothyroxine  (SYNTHROID , LEVOTHROID) 125 MCG tablet Take 125 mcg by mouth daily.     Yes [provider]  losartan  (COZAAR ) 50 MG tablet Take 50 mg by mouth daily.     Yes [provider]  methenamine  (HIPREX ) 1 g tablet 1/2 tab twice daily Patient taking differently: Take 500 mg by mouth 2 (two) times daily with a meal. 04/08/23  Yes Dahlstedt, Garnette, MD  Multiple Vitamin (MULTIVITAMIN) tablet Take 1 tablet by mouth daily.   Yes [provider]  phenazopyridine (PYRIDIUM) 95 MG tablet Take 95 mg by mouth 2 (two) times daily.   Yes [provider]  predniSONE  (DELTASONE ) 5 MG tablet Take 5 mg by mouth daily. 02/12/24  Yes [provider]    Physical Exam: BP (!) 177/71   Pulse 65   Temp 98.2 F (36.8 C)   Resp 20   Ht 5' 2 (1.575 m)   Wt 79.9 kg   SpO2 94%   BMI 32.22 kg/m   General: 89 y.o. year-old female elderly, well developed well nourished in no acute distress.  Alert and oriented x3. HEENT: NCAT, EOMI Neck: Supple, trachea medial Cardiovascular: Regular rate and rhythm with no rubs or gallops.  No thyromegaly or JVD noted.  No lower extremity edema. 2/4 pulses in all 4 extremities. Respiratory: Clear to auscultation with no wheezes or rales. Good inspiratory effort. Abdomen: Soft, nontender nondistended with normal bowel sounds x4 quadrants. Muskuloskeletal: No cyanosis, clubbing or edema noted bilaterally Neuro: CN II-XII intact, strength 5/5 x 4, sensation, reflexes intact Skin: No ulcerative lesions noted or rashes Psychiatry: Mood is appropriate for condition  and setting          Labs on Admission:  Basic Metabolic Panel: Recent Labs  Lab 03/02/24 1630  NA 140  K 4.2  CL 94*  CO2 39*  GLUCOSE 215*  BUN 27*  CREATININE 0.96  CALCIUM  10.1   Liver Function Tests: Recent Labs  Lab 03/02/24 1630  AST 18  ALT 17  ALKPHOS 57  BILITOT 0.4  PROT 6.8  ALBUMIN 3.5   No results for input(s): LIPASE, AMYLASE in the last 168 hours. No results for input(s): AMMONIA in the last 168 hours. CBC: Recent Labs  Lab 03/02/24 1630  WBC 7.0  NEUTROABS 5.4  HGB 10.1*  HCT 31.5*  MCV 99.4  PLT 169   Cardiac Enzymes: No results for input(s): CKTOTAL, CKMB, CKMBINDEX, TROPONINI in the last 168 hours.  BNP (last 3 results) No results for input(s): BNP in the last 8760 hours.  ProBNP (last 3 results) No results for input(s): PROBNP in the last 8760 hours.  CBG: No results for input(s): GLUCAP in the last 168 hours.  Radiological Exams on Admission: CT ABDOMEN PELVIS W CONTRAST Result Date: 03/02/2024 CLINICAL DATA:  Abdominal pain. EXAM: CT ABDOMEN AND PELVIS WITH CONTRAST TECHNIQUE: Multidetector CT imaging of the abdomen and pelvis was performed using the standard protocol following bolus administration of intravenous contrast. RADIATION DOSE REDUCTION: This exam was performed according to the departmental dose-optimization program which includes automated exposure control, adjustment of the mA and/or kV according to patient size and/or use of iterative reconstruction technique. CONTRAST:  OMNIPAQUE  IOHEXOL  300 MG/ML  SOLN COMPARISON:  CT abdomen pelvis dated 11/14/2022. FINDINGS: Lower chest: The visualized lung bases are clear. There is coronary vascular calcification. No intra-abdominal free air or free fluid. Hepatobiliary: The liver is unremarkable. There is mild dilatation, post cholecystectomy. No retained calcified stone noted in the central CBD. Pancreas: Unremarkable. No pancreatic ductal dilatation or  surrounding inflammatory changes. Spleen: Normal in size without focal abnormality. Adrenals/Urinary Tract: The adrenal glands unremarkable. There is no hydronephrosis on either side. There is symmetric enhancement and excretion of contrast by both kidneys. Small left renal posterior lower pole cyst. The urinary bladder is grossly unremarkable. Stomach/Bowel: There is sigmoid diverticulosis. There is moderate stool throughout the colon. There is no bowel obstruction or active inflammation. Appendectomy. Vascular/Lymphatic: Moderate aortoiliac atherosclerotic disease. The IVC is unremarkable. No portal  venous gas. There is no adenopathy. Reproductive: The uterus is grossly unremarkable no suspicious adnexal masses. Other: None Musculoskeletal: Osteopenia with degenerative changes of the spine. Total right hip arthroplasty. Moderate arthritic changes of the left hip. No acute osseous pathology. IMPRESSION: 1. No acute intra-abdominal or pelvic pathology. 2. Sigmoid diverticulosis. No bowel obstruction. 3.  Aortic Atherosclerosis (ICD10-I70.0). Electronically Signed   By: Vanetta Chou M.D.   On: 03/02/2024 19:13    EKG: I independently viewed the EKG done and my findings are as followed: EKG was not done in the ED  Assessment/Plan Present on Admission:  Rectal bleed  Essential hypertension  Mixed hyperlipidemia  Acquired hypothyroidism  COPD (chronic obstructive pulmonary disease) (HCC)  Chronic respiratory failure with hypoxia (HCC)  Principal Problem:   Rectal bleed Active Problems:   Acquired hypothyroidism   Mixed hyperlipidemia   Essential hypertension   COPD (chronic obstructive pulmonary disease) (HCC)   Chronic respiratory failure with hypoxia (HCC)   Type 2 diabetes mellitus with hyperglycemia (HCC)   Chronic kidney disease, stage 3a (HCC)  Rectal bleed Patient complained of blood in the commode CT abdomen pelvis suggestive of diverticulosis Hemoglobin stable at 10.1; continue  to trend hemoglobin GI will be consulted and we shall await further recommendations  Essential hypertension Continue home meds  Mixed hyperlipidemia Continue Lipitor  Acquired hypothyroidism Continue Synthroid   Type 2 diabetes mellitus with hyperglycemia Continue ISS and hypoglycemia protocol Hemoglobin A1c will be checked  COPD (not in acute exacerbation) Continue home DuoNeb, Ventolin  HFA as needed  Chronic respiratory failure with hypoxia Continue supplemental oxygen  per home regimen  CKD 3A Creatinine at baseline Renally adjust medications, avoid nephrotoxic agents/dehydration/hypotension   DVT prophylaxis: SCD  Code Status: Full code  Family Communication: Daughter at bedside (all questions answered to satisfaction)  Consults: Gastroenterology  Severity of Illness: The appropriate patient status for this patient is OBSERVATION. Observation status is judged to be reasonable and necessary in order to provide the required intensity of service to ensure the patient's safety. The patient's presenting symptoms, physical exam findings, and initial radiographic and laboratory data in the context of their medical condition is felt to place them at decreased risk for further clinical deterioration. Furthermore, it is anticipated that the patient will be medically stable for discharge from the hospital within 2 midnights of admission.   Author: Marieann Zipp, DO 03/03/2024 1:09 AM  For on call review [email or web address].

## 2024-03-03 ENCOUNTER — Encounter (HOSPITAL_COMMUNITY): Payer: Self-pay | Admitting: Hematology

## 2024-03-03 DIAGNOSIS — K6289 Other specified diseases of anus and rectum: Secondary | ICD-10-CM | POA: Diagnosis not present

## 2024-03-03 DIAGNOSIS — N183 Chronic kidney disease, stage 3 unspecified: Secondary | ICD-10-CM | POA: Diagnosis not present

## 2024-03-03 DIAGNOSIS — D128 Benign neoplasm of rectum: Secondary | ICD-10-CM | POA: Diagnosis not present

## 2024-03-03 DIAGNOSIS — E039 Hypothyroidism, unspecified: Secondary | ICD-10-CM

## 2024-03-03 DIAGNOSIS — M810 Age-related osteoporosis without current pathological fracture: Secondary | ICD-10-CM | POA: Diagnosis present

## 2024-03-03 DIAGNOSIS — D123 Benign neoplasm of transverse colon: Secondary | ICD-10-CM | POA: Diagnosis not present

## 2024-03-03 DIAGNOSIS — I10 Essential (primary) hypertension: Secondary | ICD-10-CM | POA: Diagnosis not present

## 2024-03-03 DIAGNOSIS — D649 Anemia, unspecified: Secondary | ICD-10-CM | POA: Diagnosis not present

## 2024-03-03 DIAGNOSIS — Z87891 Personal history of nicotine dependence: Secondary | ICD-10-CM | POA: Diagnosis not present

## 2024-03-03 DIAGNOSIS — E782 Mixed hyperlipidemia: Secondary | ICD-10-CM | POA: Diagnosis not present

## 2024-03-03 DIAGNOSIS — J410 Simple chronic bronchitis: Secondary | ICD-10-CM

## 2024-03-03 DIAGNOSIS — Z79899 Other long term (current) drug therapy: Secondary | ICD-10-CM | POA: Diagnosis not present

## 2024-03-03 DIAGNOSIS — N1831 Chronic kidney disease, stage 3a: Secondary | ICD-10-CM

## 2024-03-03 DIAGNOSIS — E1165 Type 2 diabetes mellitus with hyperglycemia: Secondary | ICD-10-CM | POA: Diagnosis present

## 2024-03-03 DIAGNOSIS — E1122 Type 2 diabetes mellitus with diabetic chronic kidney disease: Secondary | ICD-10-CM | POA: Diagnosis present

## 2024-03-03 DIAGNOSIS — J9611 Chronic respiratory failure with hypoxia: Secondary | ICD-10-CM | POA: Diagnosis not present

## 2024-03-03 DIAGNOSIS — J441 Chronic obstructive pulmonary disease with (acute) exacerbation: Secondary | ICD-10-CM | POA: Diagnosis not present

## 2024-03-03 DIAGNOSIS — R531 Weakness: Secondary | ICD-10-CM | POA: Diagnosis present

## 2024-03-03 DIAGNOSIS — I129 Hypertensive chronic kidney disease with stage 1 through stage 4 chronic kidney disease, or unspecified chronic kidney disease: Secondary | ICD-10-CM | POA: Diagnosis not present

## 2024-03-03 DIAGNOSIS — K5730 Diverticulosis of large intestine without perforation or abscess without bleeding: Secondary | ICD-10-CM | POA: Diagnosis not present

## 2024-03-03 DIAGNOSIS — M109 Gout, unspecified: Secondary | ICD-10-CM | POA: Diagnosis present

## 2024-03-03 DIAGNOSIS — K623 Rectal prolapse: Secondary | ICD-10-CM | POA: Diagnosis not present

## 2024-03-03 DIAGNOSIS — J449 Chronic obstructive pulmonary disease, unspecified: Secondary | ICD-10-CM | POA: Diagnosis not present

## 2024-03-03 DIAGNOSIS — Z515 Encounter for palliative care: Secondary | ICD-10-CM | POA: Diagnosis not present

## 2024-03-03 DIAGNOSIS — H409 Unspecified glaucoma: Secondary | ICD-10-CM | POA: Diagnosis present

## 2024-03-03 DIAGNOSIS — D638 Anemia in other chronic diseases classified elsewhere: Secondary | ICD-10-CM | POA: Diagnosis present

## 2024-03-03 DIAGNOSIS — K5909 Other constipation: Secondary | ICD-10-CM | POA: Diagnosis present

## 2024-03-03 DIAGNOSIS — Z6832 Body mass index (BMI) 32.0-32.9, adult: Secondary | ICD-10-CM | POA: Diagnosis not present

## 2024-03-03 DIAGNOSIS — Z96649 Presence of unspecified artificial hip joint: Secondary | ICD-10-CM | POA: Diagnosis present

## 2024-03-03 DIAGNOSIS — D125 Benign neoplasm of sigmoid colon: Secondary | ICD-10-CM | POA: Diagnosis not present

## 2024-03-03 DIAGNOSIS — K625 Hemorrhage of anus and rectum: Principal | ICD-10-CM

## 2024-03-03 DIAGNOSIS — K219 Gastro-esophageal reflux disease without esophagitis: Secondary | ICD-10-CM | POA: Diagnosis present

## 2024-03-03 DIAGNOSIS — K921 Melena: Secondary | ICD-10-CM | POA: Diagnosis present

## 2024-03-03 LAB — COMPREHENSIVE METABOLIC PANEL WITH GFR
ALT: 14 U/L (ref 0–44)
AST: 15 U/L (ref 15–41)
Albumin: 3.3 g/dL — ABNORMAL LOW (ref 3.5–5.0)
Alkaline Phosphatase: 48 U/L (ref 38–126)
Anion gap: 11 (ref 5–15)
BUN: 21 mg/dL (ref 8–23)
CO2: 39 mmol/L — ABNORMAL HIGH (ref 22–32)
Calcium: 9.6 mg/dL (ref 8.9–10.3)
Chloride: 94 mmol/L — ABNORMAL LOW (ref 98–111)
Creatinine, Ser: 0.81 mg/dL (ref 0.44–1.00)
GFR, Estimated: >60 mL/min (ref >60)
Glucose, Bld: 100 mg/dL — ABNORMAL HIGH (ref 70–99)
Potassium: 3.6 mmol/L (ref 3.5–5.1)
Sodium: 144 mmol/L (ref 135–145)
Total Bilirubin: 0.5 mg/dL (ref 0.0–1.2)
Total Protein: 6.6 g/dL (ref 6.5–8.1)

## 2024-03-03 LAB — CBC
HCT: 30.5 % — ABNORMAL LOW (ref 36.0–46.0)
Hemoglobin: 9.5 g/dL — ABNORMAL LOW (ref 12.0–15.0)
MCH: 30.5 pg (ref 26.0–34.0)
MCHC: 31.1 g/dL (ref 30.0–36.0)
MCV: 98.1 fL (ref 80.0–100.0)
Platelets: 168 10*3/uL (ref 150–400)
RBC: 3.11 MIL/uL — ABNORMAL LOW (ref 3.87–5.11)
RDW: 13.3 % (ref 11.5–15.5)
WBC: 7.2 10*3/uL (ref 4.0–10.5)
nRBC: 0 % (ref 0.0–0.2)

## 2024-03-03 LAB — PHOSPHORUS: Phosphorus: 3.5 mg/dL (ref 2.5–4.6)

## 2024-03-03 LAB — GLUCOSE, CAPILLARY
Glucose-Capillary: 103 mg/dL — ABNORMAL HIGH (ref 70–99)
Glucose-Capillary: 95 mg/dL (ref 70–99)
Glucose-Capillary: 117 mg/dL — ABNORMAL HIGH (ref 70–99)
Glucose-Capillary: 112 mg/dL — ABNORMAL HIGH (ref 70–99)
Glucose-Capillary: 100 mg/dL — ABNORMAL HIGH (ref 70–99)

## 2024-03-03 LAB — HEMOGLOBIN AND HEMATOCRIT, BLOOD
HCT: 32.8 % — ABNORMAL LOW (ref 36.0–46.0)
HCT: 33.3 % — ABNORMAL LOW (ref 36.0–46.0)
Hemoglobin: 10.3 g/dL — ABNORMAL LOW (ref 12.0–15.0)
Hemoglobin: 10.7 g/dL — ABNORMAL LOW (ref 12.0–15.0)

## 2024-03-03 LAB — MAGNESIUM: Magnesium: 1.9 mg/dL (ref 1.7–2.4)

## 2024-03-03 LAB — HEMOGLOBIN A1C
Hgb A1c MFr Bld: 5.4 % (ref 4.8–5.6)
Mean Plasma Glucose: 108.28 mg/dL

## 2024-03-03 LAB — OCCULT BLOOD, POC DEVICE: Fecal Occult Bld: POSITIVE — AB

## 2024-03-03 MED ORDER — CARVEDILOL 12.5 MG PO TABS
25.0000 mg | ORAL_TABLET | Freq: Two times a day (BID) | ORAL | Status: DC
  Administered 2024-03-03 – 2024-03-11 (×16): 25 mg via ORAL
  Filled 2024-03-03 (×3): qty 2
  Filled 2024-03-03: qty 8
  Filled 2024-03-03 (×13): qty 2

## 2024-03-03 MED ORDER — IPRATROPIUM-ALBUTEROL 0.5-2.5 (3) MG/3ML IN SOLN
3.0000 mL | RESPIRATORY_TRACT | Status: DC | PRN
  Administered 2024-03-03 – 2024-03-05 (×2): 3 mL via RESPIRATORY_TRACT
  Filled 2024-03-03 (×2): qty 3

## 2024-03-03 MED ORDER — INSULIN ASPART 100 UNIT/ML IJ SOLN
0.0000 [IU] | Freq: Three times a day (TID) | INTRAMUSCULAR | Status: DC
  Administered 2024-03-04 – 2024-03-05 (×2): 1 [IU] via SUBCUTANEOUS
  Administered 2024-03-05: 2 [IU] via SUBCUTANEOUS
  Administered 2024-03-06: 3 [IU] via SUBCUTANEOUS
  Administered 2024-03-07: 5 [IU] via SUBCUTANEOUS
  Administered 2024-03-07 – 2024-03-09 (×3): 2 [IU] via SUBCUTANEOUS
  Administered 2024-03-09: 1 [IU] via SUBCUTANEOUS
  Administered 2024-03-10: 2 [IU] via SUBCUTANEOUS
  Administered 2024-03-10: 3 [IU] via SUBCUTANEOUS
  Administered 2024-03-11: 1 [IU] via SUBCUTANEOUS

## 2024-03-03 MED ORDER — INSULIN ASPART 100 UNIT/ML IJ SOLN: 0.0000 [IU] | INTRAMUSCULAR | Status: DC

## 2024-03-03 MED ORDER — INSULIN ASPART 100 UNIT/ML IJ SOLN
0.0000 [IU] | Freq: Every day | INTRAMUSCULAR | Status: DC
  Administered 2024-03-04: 3 [IU] via SUBCUTANEOUS
  Administered 2024-03-05 – 2024-03-07 (×2): 2 [IU] via SUBCUTANEOUS

## 2024-03-03 MED ORDER — ALBUTEROL SULFATE HFA 108 (90 BASE) MCG/ACT IN AERS: 2.0000 | INHALATION_SPRAY | Freq: Four times a day (QID) | RESPIRATORY_TRACT | Status: DC | PRN

## 2024-03-03 MED ORDER — PEG 3350-KCL-NA BICARB-NACL 420 G PO SOLR
4000.0000 mL | Freq: Once | ORAL | Status: AC
  Administered 2024-03-03: 4000 mL via ORAL

## 2024-03-03 MED ORDER — LOSARTAN POTASSIUM 50 MG PO TABS
50.0000 mg | ORAL_TABLET | Freq: Every day | ORAL | Status: DC
  Administered 2024-03-03 – 2024-03-11 (×9): 50 mg via ORAL
  Filled 2024-03-03 (×9): qty 1

## 2024-03-03 MED ORDER — ATORVASTATIN CALCIUM 10 MG PO TABS
10.0000 mg | ORAL_TABLET | Freq: Every day | ORAL | Status: DC
  Administered 2024-03-03 – 2024-03-10 (×8): 10 mg via ORAL
  Filled 2024-03-03 (×8): qty 1

## 2024-03-03 MED ORDER — LEVOTHYROXINE SODIUM 100 MCG PO TABS
125.0000 ug | ORAL_TABLET | Freq: Every day | ORAL | Status: DC
  Administered 2024-03-04 – 2024-03-11 (×8): 125 ug via ORAL
  Filled 2024-03-03 (×8): qty 1

## 2024-03-03 MED ORDER — IPRATROPIUM-ALBUTEROL 0.5-2.5 (3) MG/3ML IN SOLN
3.0000 mL | Freq: Three times a day (TID) | RESPIRATORY_TRACT | Status: DC
  Administered 2024-03-03 – 2024-03-11 (×22): 3 mL via RESPIRATORY_TRACT
  Filled 2024-03-03 (×21): qty 3

## 2024-03-03 NOTE — TOC Initial Note (Signed)
 Transition of Care [facility]) - Initial/Assessment Note    Patient Details  Name: Paige Rhodes MRN: [id number] Date of Birth: 06/25/1932  Transition of Care [facility]) CM/SW Contact:    Hoy DELENA Bigness, LCSW Phone Number: 03/03/2024, 10:47 AM  Clinical Narrative:                 Pt admitted due to rectal bleed. Pt from home and is active with hospice services through [facility]. Pt has PCA 4hrs a day in the AM. Pt has RW at home and is on 4L of O2 at baseline. O2 provided by [employer] and daughter is able to bring travel tank for transportation home at discharge.  TOC will continue to follow for discharge planning needs.   Expected Discharge Plan: Home w Hospice Care Barriers to Discharge: Continued Medical Work up   Patient Goals and CMS Choice Patient states their goals for this hospitalization and ongoing recovery are:: To return home          Expected Discharge Plan and Services In-house Referral: NA Discharge Planning Services: NA Post Acute Care Choice: Resumption of Svcs/PTA Provider, Hospice Living arrangements for the past 2 months: Single Family Home                 DME Arranged: N/A DME Agency: NA                  Prior Living Arrangements/Services Living arrangements for the past 2 months: Single Family Home Lives with:: Self Patient language and need for interpreter reviewed:: Yes Do you feel safe going back to the place where you live?: Yes      Need for Family Participation in Patient Care: No (Comment) Care giver support system in place?: Yes (comment) Current home services: DME, Hospice, Homehealth aide Criminal Activity/Legal Involvement Pertinent to Current Situation/Hospitalization: No - Comment as needed  Activities of Daily Living      Permission Sought/Granted Permission sought to share information with : Family Supports Permission granted to share information with : Yes, Verbal Permission Granted  Share Information with NAME:  Marien Setter (Daughter)  [phone]           Emotional Assessment Appearance:: Appears stated age Attitude/Demeanor/Rapport: Engaged Affect (typically observed): Accepting Orientation: : Oriented to Self, Oriented to Place, Oriented to  Time, Oriented to Situation Alcohol / Substance Use: Not Applicable Psych Involvement: No (comment)  Admission diagnosis:  Rectal bleeding [K62.5] Rectal bleed [K62.5] Patient Active Problem List   Diagnosis Date Noted   Type 2 diabetes mellitus with hyperglycemia (HCC) 03/03/2024   Chronic kidney disease, stage 3a (HCC) 03/03/2024   Rectal bleed 03/02/2024   Incarcerated hernia 11/14/2022   Chronic respiratory failure with hypoxia (HCC) 12/17/2021   Colles' fracture of left radius, initial encounter for closed fracture    Elevated troponin 09/02/2017   Anemia 09/02/2017   Hypomagnesemia 09/02/2017   Chronic renal disease, stage 2, mildly decreased glomerular filtration rate between 60-89 mL/min/1.73 square meter 10/31/2014   Port-A-Cath in place 01/15/2012   Controlled type 2 diabetes mellitus without complication (HCC) 01/15/2012   Abnormal presence of protein in urine 01/15/2012   Acquired hypothyroidism 01/15/2012   Hyperparathyroidism (HCC) 01/15/2012   Mixed hyperlipidemia 01/15/2012   Gout 01/15/2012   Essential hypertension 01/15/2012   COPD (chronic obstructive pulmonary disease) (HCC) 01/15/2012   OP (osteoporosis) 01/15/2012   Infiltrating ductal carcinoma of left breast 03/05/2011   Overlapping malignant neoplasm of female breast (HCC) 03/08/2010   PCP:  Sheryle  Gaither, MD Pharmacy:   [facility] [place], [place] - [address] [place] [address] Phone: [phone] Fax: [phone]  [facility] [address], [place] - [address] [place] [address] [place] [address] [place] [address] [place] [address] Phone: [phone] Fax: [phone]     Social Drivers of Health (SDOH) Social History: SDOH  Screenings   Food Insecurity: No Food Insecurity (03/03/2024)  Housing: Low Risk  (03/03/2024)  Transportation Needs: No Transportation Needs (03/03/2024)  Utilities: Not At Risk (03/03/2024)  Social Connections: Unknown (03/03/2024)  Tobacco Use: Medium Risk (03/02/2024)   SDOH Interventions:     Readmission Risk Interventions    12/10/2021   10:08 AM  Readmission Risk Prevention Plan  Transportation Screening Complete  Home Care Screening Complete  Medication Review (RN CM) Complete

## 2024-03-03 NOTE — H&P (View-Only) (Signed)
 Gastroenterology Consult   Referring Provider: No ref. provider found Primary Care Physician:  [name], MD Primary Gastroenterologist:  not established   Patient ID: Paige Rhodes; [id number]; 07-Dec-1931   Admit date: 03/02/2024  LOS: 0 days   Date of Consultation: 03/03/2024  Reason for Consultation:  rectal bleeding   History of Present Illness   Paige Rhodes is a 89 y.o. year old female with history of of hypertension, hyperlipidemia, T2DM, normocytic anemia, chronic respiratory failure with hypoxia on 4L of oxygen  at baseline, COPD, hypothyroidism, GERD, breast CA s/p lumpectomy, stage IIIa CKD who presented to the ED with rectal bleeding x3 episodes. GI consulted for further evaluation  ED Course: VSS Hemoccult positive CT A/P with contrast showed sigmoid diverticulosis Hgb 10.1( baseline 9-11 range, follows with hematology) BUN 27 (baseline)  Recent iron panel in April with ferritin 184, iron 66, TIBC 270, sat 24 B12 1597  Consult: Patient states she was sitting in her chair yesterday afternoon. She had a severe urge to run to the restroom where she had another stool then noted blood dripping out from her rectum, she had 2 more subsequent stools, both with BRB to follow. She got up to get a wash rag and dripped BRB across the floor as she walked to get it. She denies any pain but notes a pulling or popping sensation near her umbilicus. Stools were partially diarrhea and partially formed. She notes blood appeared to come after her stools. She has not had any BMs today. She was feeling her usual state of health prior to onset of bleeding. No nausea or vomiting. She does note history of hemorrhoids with some flares on occasion and some occasional bleeding. She endorses some constipation but has not had any issues this week prior to onset of bleeding. She takes miralax  or a stool softener PRN. Denies any melena. No changes in appetite or weight loss.   She is on baby ASA daily,  rarely takes any other NSAIDs, no ACs  No ETOH, previous smoker  No CRC in family  Last EGD: never  Last Colonoscopy: many years ago.    Past Medical History:  Diagnosis Date   Breast cancer (HCC)    Chronic renal disease, stage 2, mildly decreased glomerular filtration rate between 60-89 mL/min/1.73 square meter 10/31/2014   COPD (chronic obstructive pulmonary disease) (HCC)    Diabetes mellitus    GERD (gastroesophageal reflux disease)    occasional   Glaucoma    Gout attack june 2013   left foot   Hard of hearing    Hyperlipidemia    Hypertension    Hypothyroidism    Infiltrating ductal carcinoma of left breast 03/05/2011   On home oxygen  therapy    2 LPM   Osteoporosis    Port catheter in place 01/15/2012   SOB (shortness of breath)    Thyroid  disease     Past Surgical History:  Procedure Laterality Date   ANKLE SURGERY  1998   APPENDECTOMY  1955   BREAST LUMPECTOMY  2011   left   CATARACT EXTRACTION W/PHACO  09/03/2012   Procedure: CATARACT EXTRACTION PHACO AND INTRAOCULAR LENS PLACEMENT (IOC);  Surgeon: Cherene Mania, MD;  Location: [facility];  Service: Ophthalmology;  Laterality: Right;  CDE: 15.26   CATARACT EXTRACTION W/PHACO  09/28/2012   Procedure: CATARACT EXTRACTION PHACO AND INTRAOCULAR LENS PLACEMENT (IOC);  Surgeon: Cherene Mania, MD;  Location: [facility];  Service: Ophthalmology;  Laterality: Left;  CDE:  21.60  CHOLECYSTECTOMY  1990   INGUINAL HERNIA REPAIR Left 11/14/2022   Procedure: INCARCERATED INGUINAL HERNIA REPAIR WITH MESH;  Surgeon: Evonnie Dorothyann LABOR, DO;  Location: [facility];  Service: General;  Laterality: Left;   OPEN REDUCTION INTERNAL FIXATION (ORIF) DISTAL RADIAL FRACTURE Left 07/01/2019   Procedure: OPEN REDUCTION INTERNAL FIXATION (ORIF) LEFT DISTAL RADIAL FRACTURE;  Surgeon: Margrette Taft BRAVO, MD;  Location: [facility];  Service: Orthopedics;  Laterality: Left;   PORT-A-CATH REMOVAL N/A 04/26/2015   Procedure: REMOVAL PORT-A-CATH;  Surgeon: Oneil Budge, MD;  Location: [facility];  Service: General;  Laterality: N/A;   PORTACATH PLACEMENT  05/23/10   THYROID  SURGERY  1994   TONSILLECTOMY     TOTAL HIP ARTHROPLASTY  1996    Prior to Admission medications   Medication Sig Start Date End Date Taking? Authorizing Provider  acetaminophen  (TYLENOL ) 500 MG tablet Take 2 tablets (1,000 mg total) by mouth every 6 (six) hours as needed for mild pain, moderate pain, fever or headache. 11/15/22  Yes Bryn Bernardino NOVAK, MD  albuterol  (VENTOLIN  HFA) 108 (90 Base) MCG/ACT inhaler Inhale 2 puffs into the lungs every 6 (six) hours as needed for wheezing or shortness of breath. 12/21/20  Yes [provider]  amLODipine  (NORVASC ) 5 MG tablet Take 5 mg by mouth daily.    Yes [provider]  Ascorbic Acid (VITAMIN C PO) Take 1 tablet by mouth daily.   Yes [provider]  aspirin  EC 81 MG tablet Take 81 mg by mouth at bedtime.   Yes [provider]  atorvastatin  (LIPITOR) 10 MG tablet Take 10 mg by mouth daily. 08/18/19  Yes [provider]  Calcium -Magnesium -Vitamin D  (CALCIUM  500 PO) Take 1 tablet by mouth every other day.   Yes [provider]  carvedilol  (COREG ) 25 MG tablet Take 25 mg by mouth 2 (two) times daily with a meal.     Yes [provider]  estradiol  (ESTRACE ) 0.1 MG/GM vaginal cream INSERT 1/2 INCH IN APPLICATOR VAGINALLY TWICE WEEKLY. Patient taking differently: Place 1 Applicatorful vaginally 2 (two) times a week. INSERT 1/2 INCH IN APPLICATOR VAGINALLY TWICE WEEKLY. 12/11/23  Yes Dahlstedt, Garnette, MD  furosemide  (LASIX ) 20 MG tablet Take 20 mg by mouth daily as needed for fluid.   Yes [provider]  GOODSENSE CLEARLAX 17 GM/SCOOP powder Take 17 g by mouth daily as needed for mild constipation. 02/04/23  Yes [provider]  hydrocortisone (ANUSOL-HC) 2.5 % rectal cream Apply 1 Application topically 2 (two) times daily. 08/19/22  Yes [provider]   ipratropium-albuterol  (DUONEB) 0.5-2.5 (3) MG/3ML SOLN Take 3 mLs by nebulization every 4 (four) hours as needed (wheezing, shortness of breath). 02/12/24  Yes [provider]  ketoconazole (NIZORAL) 2 % cream Apply 1 Application topically 2 (two) times daily. 02/19/24  Yes [provider]  lactobacillus acidophilus (BACID) TABS tablet Take 1 tablet by mouth daily.   Yes [provider]  levothyroxine  (SYNTHROID , LEVOTHROID) 125 MCG tablet Take 125 mcg by mouth daily.     Yes [provider]  losartan  (COZAAR ) 50 MG tablet Take 50 mg by mouth daily.     Yes [provider]  methenamine  (HIPREX ) 1 g tablet 1/2 tab twice daily Patient taking differently: Take 500 mg by mouth 2 (two) times daily with a meal. 04/08/23  Yes Dahlstedt, Garnette, MD  Multiple Vitamin (MULTIVITAMIN) tablet Take 1 tablet by mouth daily.   Yes [provider]  phenazopyridine (PYRIDIUM) 95 MG  tablet Take 95 mg by mouth 2 (two) times daily.   Yes [provider]  predniSONE  (DELTASONE ) 5 MG tablet Take 5 mg by mouth daily. 02/12/24  Yes [provider]    Current Facility-Administered Medications  Medication Dose Route Frequency Provider Last Rate Last Admin   atorvastatin  (LIPITOR) tablet 10 mg  10 mg Oral QHS Adefeso, Oladapo, DO       carvedilol  (COREG ) tablet 25 mg  25 mg Oral BID WC Adefeso, Oladapo, DO       insulin  aspart (novoLOG ) injection 0-9 Units  0-9 Units Subcutaneous Q4H Adefeso, Oladapo, DO       ipratropium-albuterol  (DUONEB) 0.5-2.5 (3) MG/3ML nebulizer solution 3 mL  3 mL Nebulization Q4H PRN Adefeso, Oladapo, DO       levothyroxine  (SYNTHROID ) tablet 125 mcg  125 mcg Oral Q0600 Adefeso, Oladapo, DO       losartan  (COZAAR ) tablet 50 mg  50 mg Oral Daily Adefeso, Oladapo, DO       Facility-Administered Medications Ordered in Other Encounters  Medication Dose Route Frequency Provider Last Rate Last Admin   sodium chloride  0.9 % injection  10 mL  10 mL Intravenous PRN Lawernce Hacker, MD   10 mL at 05/05/14 1150    Allergies as of 03/02/2024 - Review Complete 03/02/2024  Allergen Reaction Noted   Penicillins Anaphylaxis 02/07/2011   Hctz [hydrochlorothiazide] Other (See Comments) 04/22/2011   Lotrel [amlodipine  besy-benazepril hcl] Other (See Comments) 09/28/2012   Morphine  and codeine  Itching 02/07/2011    Family History  Problem Relation Age of Onset   Kidney disease Mother    Heart disease Father    Hypertension Brother    Cancer Sister        breast and lung    Social History   Socioeconomic History   Marital status: Married    Spouse name: Not on file   Number of children: Not on file   Years of education: Not on file   Highest education level: Not on file  Occupational History   Not on file  Tobacco Use   Smoking status: Former    Current packs/day: 0.00    Average packs/day: 3.0 packs/day for 30.0 years (90.0 ttl pk-yrs)    Types: Cigarettes    Start date: 09/01/1961    Quit date: 09/02/1991    Years since quitting: 32.5   Smokeless tobacco: Never  Vaping Use   Vaping status: Never Used  Substance and Sexual Activity   Alcohol use: No   Drug use: No   Sexual activity: Yes    Birth control/protection: Post-menopausal  Other Topics Concern   Not on file  Social History Narrative   Not on file   Social Drivers of Health   Financial Resource Strain: Not on file  Food Insecurity: No Food Insecurity (11/14/2022)   Hunger Vital Sign    Worried About Running Out of Food in the Last Year: Never true    Ran Out of Food in the Last Year: Never true  Transportation Needs: No Transportation Needs (03/03/2024)   PRAPARE - Administrator, Civil Service (Medical): No    Lack of Transportation (Non-Medical): No  Physical Activity: Not on file  Stress: Not on file  Social Connections: Not on file  Intimate Partner Violence: Not At Risk (03/03/2024)   Humiliation, Afraid, Rape, and Kick  questionnaire    Fear of Current or Ex-Partner: No    Emotionally Abused: No    Physically Abused:  No    Sexually Abused: No     Review of Systems   Gen: Denies any fever, chills, loss of appetite, change in weight or weight loss CV: Denies chest pain, heart palpitations, syncope, edema  Resp: Denies shortness of breath with rest, cough, wheezing, coughing up blood, and pleurisy. GI: Denies vomiting blood, jaundice, and fecal incontinence.   Denies dysphagia or odynophagia. GU :  denies melena, nausea, vomiting, diarrhea, dysphagia, odyonophagia, early satiety or weight loss. +constipation +rectal bleeding MS: Denies joint pain, limitation of movement, swelling, cramps, and atrophy.  Derm: Denies rash, itching, dry skin, hives. Psych: Denies depression, anxiety, memory loss, hallucinations, and confusion. Heme: Denies bruising or bleeding Neuro:  Denies any headaches, dizziness, paresthesias, shaking  Physical Exam   Vital Signs in last 24 hours: Temp:  [98.1 F (36.7 C)-98.7 F (37.1 C)] 98.7 F (37.1 C) (07/02 0430) Pulse Rate:  [61-67] 63 (07/02 0430) Resp:  [18-22] 22 (07/02 0430) BP: (145-184)/(61-75) 184/62 (07/02 0430) SpO2:  [93 %-98 %] 96 % (07/02 0430) FiO2 (%):  [36 %] 36 % (07/02 0105) Weight:  [73.7 kg-79.9 kg] 79.9 kg (07/01 2149) Last BM Date : 03/02/24  General:   Alert,  Well-developed, well-nourished, pleasant and cooperative in NAD Head:  Normocephalic and atraumatic. Eyes:  Sclera clear, no icterus.   Conjunctiva pink. Ears:  Normal auditory acuity. Mouth:  No deformity or lesions, dentition normal. Neck:  Supple; no masses Lungs:  Clear throughout to auscultation.   No wheezes, crackles, or rhonchi. No acute distress. Heart:  Regular rate and rhythm; no murmurs, clicks, rubs,  or gallops. Abdomen:  Soft, nontender and nondistended. No masses, hepatosplenomegaly or hernias noted. Normal bowel sounds, without guarding, and without rebound.   Rectal:  John RN present as witness, no obvious masses, lesions or fissures noted externally, DRE was unremarkable, sphincter tone normal, some hard stool felt in stool vault, patient tolerated exam without issue   Msk:  Symmetrical without gross deformities. Normal posture. Extremities:  Without clubbing or edema. Neurologic:  Alert and  oriented x4. Skin:  Intact without significant lesions or rashes. Psych:  Alert and cooperative. Normal mood and affect.  Intake/Output from previous day: 07/01 0701 - 07/02 0700 In: 0  Out: 500 [Urine:500] Intake/Output this shift: No intake/output data recorded.   Labs/Studies   Recent Labs Recent Labs    03/02/24 1630 03/03/24 0427  WBC 7.0 7.2  HGB 10.1* 9.5*  HCT 31.5* 30.5*  PLT 169 168   BMET Recent Labs    03/02/24 1630 03/03/24 0427  NA 140 144  K 4.2 3.6  CL 94* 94*  CO2 39* 39*  GLUCOSE 215* 100*  BUN 27* 21  CREATININE 0.96 0.81  CALCIUM  10.1 9.6   LFT Recent Labs    03/02/24 1630 03/03/24 0427  PROT 6.8 6.6  ALBUMIN 3.5 3.3*  AST 18 15  ALT 17 14  ALKPHOS 57 48  BILITOT 0.4 0.5   PT/INR No results for input(s): LABPROT, INR in the last 72 hours. Hepatitis Panel No results for input(s): HEPBSAG, HCVAB, HEPAIGM, HEPBIGM in the last 72 hours. C-Diff No results for input(s): CDIFFTOX in the last 72 hours.  Radiology/Studies CT ABDOMEN PELVIS W CONTRAST Result Date: 03/02/2024 CLINICAL DATA:  Abdominal pain. EXAM: CT ABDOMEN AND PELVIS WITH CONTRAST TECHNIQUE: Multidetector CT imaging of the abdomen and pelvis was performed using the standard protocol following bolus administration of intravenous contrast. RADIATION DOSE REDUCTION: This exam was performed according to the  departmental dose-optimization program which includes automated exposure control, adjustment of the mA and/or kV according to patient size and/or use of iterative reconstruction technique. CONTRAST:  OMNIPAQUE  IOHEXOL  300 MG/ML  SOLN  COMPARISON:  CT abdomen pelvis dated 11/14/2022. FINDINGS: Lower chest: The visualized lung bases are clear. There is coronary vascular calcification. No intra-abdominal free air or free fluid. Hepatobiliary: The liver is unremarkable. There is mild dilatation, post cholecystectomy. No retained calcified stone noted in the central CBD. Pancreas: Unremarkable. No pancreatic ductal dilatation or surrounding inflammatory changes. Spleen: Normal in size without focal abnormality. Adrenals/Urinary Tract: The adrenal glands unremarkable. There is no hydronephrosis on either side. There is symmetric enhancement and excretion of contrast by both kidneys. Small left renal posterior lower pole cyst. The urinary bladder is grossly unremarkable. Stomach/Bowel: There is sigmoid diverticulosis. There is moderate stool throughout the colon. There is no bowel obstruction or active inflammation. Appendectomy. Vascular/Lymphatic: Moderate aortoiliac atherosclerotic disease. The IVC is unremarkable. No portal venous gas. There is no adenopathy. Reproductive: The uterus is grossly unremarkable no suspicious adnexal masses. Other: None Musculoskeletal: Osteopenia with degenerative changes of the spine. Total right hip arthroplasty. Moderate arthritic changes of the left hip. No acute osseous pathology. IMPRESSION: 1. No acute intra-abdominal or pelvic pathology. 2. Sigmoid diverticulosis. No bowel obstruction. 3.  Aortic Atherosclerosis (ICD10-I70.0). Electronically Signed   By: Vanetta Chou M.D.   On: 03/02/2024 19:13    Assessment   ICESIS RENN is a 89 y.o. year old female with history of normocytic anemia who presented to the ED with rectal bleeding x3 episodes. GI consulted for further evaluation.  Rectal bleeding: 3 episodes of BRB after BMs, she denied abdominal pain but did note a sensation of pulling or popping around her umbilicus prior to onset. Last TCS was many years ago. She denies any GI symptoms other than  chronic constipation leading up to her episodes of rectal bleeding. She has history of hemorrhoids as well though no obvious hemorrhoids on exam. We discussed colonoscopy to evaluate for bleeding polyps, AVMS, malignancy, as I cannot rule out any of these, though patient declined, she does not wish to pursue this at her age which I think is reasonable given her other multimorbidities. Patient's daughter present during encounter is also in agreement to hold off on colonoscopy at this time. Its possible this was a diverticular bleed which are usually self limiting, hemorrhoids also on the differential. Rectal exam was without obvious hemorrhoids or overt bleeding. We will monitor for any recurrence of bleeding and trend hemoglobin.   Chronic normocytic anemia: chronic since atleast 2011, following with hematology. Hgb stable around baseline of 9.5. normal iron in April.    Plan / Recommendations   Trend h&h, transfuse for hgb <7 Monitor for overt GI bleeding PPI daily Miralax  BID Can use Anusol for hemorrhoids PRN Continue to follow with hematology regarding chronic anemia 7. Heart healthy/carb mod diet today since no plans for endo procedures    03/03/2024, 9:15 AM Yuuki Skeens L. Issaih Kaus, MSN, APRN, AGNP-C Adult-Gerontology Nurse Practitioner [facility] at [facility]

## 2024-03-03 NOTE — Progress Notes (Signed)
 Mobility Specialist Progress Note:    03/03/24 1210  Mobility  Activity Transferred to/from [facility]  Level of Assistance Contact guard assist, steadying assist  Assistive Device None;BSC  Distance Ambulated (ft) 4 ft  Range of Motion/Exercises Active;All extremities  Activity Response Tolerated well  Mobility Referral Yes  Mobility visit 1 Mobility  Mobility Specialist Start Time (ACUTE ONLY) 1200  Mobility Specialist Stop Time (ACUTE ONLY) 1212  Mobility Specialist Time Calculation (min) (ACUTE ONLY) 12 min   Pt received requesting assistance to [facility]. Required CGA to stand and transfer with no AD. Tolerated well, asx throughout. Left with NT, all needs met.   Sherrilee Ditty Mobility Specialist Please contact via Special educational needs teacher or  Rehab office at [phone]

## 2024-03-03 NOTE — Consult Note (Addendum)
 Gastroenterology Consult   Referring Provider: No ref. provider found Primary Care Physician:  [name], MD Primary Gastroenterologist:  not established   Patient ID: Paige Rhodes; [id number]; 07-Dec-1931   Admit date: 03/02/2024  LOS: 0 days   Date of Consultation: 03/03/2024  Reason for Consultation:  rectal bleeding   History of Present Illness   Paige Rhodes is a 89 y.o. year old female with history of of hypertension, hyperlipidemia, T2DM, normocytic anemia, chronic respiratory failure with hypoxia on 4L of oxygen  at baseline, COPD, hypothyroidism, GERD, breast CA s/p lumpectomy, stage IIIa CKD who presented to the ED with rectal bleeding x3 episodes. GI consulted for further evaluation  ED Course: VSS Hemoccult positive CT A/P with contrast showed sigmoid diverticulosis Hgb 10.1( baseline 9-11 range, follows with hematology) BUN 27 (baseline)  Recent iron panel in April with ferritin 184, iron 66, TIBC 270, sat 24 B12 1597  Consult: Patient states she was sitting in her chair yesterday afternoon. She had a severe urge to run to the restroom where she had another stool then noted blood dripping out from her rectum, she had 2 more subsequent stools, both with BRB to follow. She got up to get a wash rag and dripped BRB across the floor as she walked to get it. She denies any pain but notes a pulling or popping sensation near her umbilicus. Stools were partially diarrhea and partially formed. She notes blood appeared to come after her stools. She has not had any BMs today. She was feeling her usual state of health prior to onset of bleeding. No nausea or vomiting. She does note history of hemorrhoids with some flares on occasion and some occasional bleeding. She endorses some constipation but has not had any issues this week prior to onset of bleeding. She takes miralax  or a stool softener PRN. Denies any melena. No changes in appetite or weight loss.   She is on baby ASA daily,  rarely takes any other NSAIDs, no ACs  No ETOH, previous smoker  No CRC in family  Last EGD: never  Last Colonoscopy: many years ago.    Past Medical History:  Diagnosis Date   Breast cancer (HCC)    Chronic renal disease, stage 2, mildly decreased glomerular filtration rate between 60-89 mL/min/1.73 square meter 10/31/2014   COPD (chronic obstructive pulmonary disease) (HCC)    Diabetes mellitus    GERD (gastroesophageal reflux disease)    occasional   Glaucoma    Gout attack june 2013   left foot   Hard of hearing    Hyperlipidemia    Hypertension    Hypothyroidism    Infiltrating ductal carcinoma of left breast 03/05/2011   On home oxygen  therapy    2 LPM   Osteoporosis    Port catheter in place 01/15/2012   SOB (shortness of breath)    Thyroid  disease     Past Surgical History:  Procedure Laterality Date   ANKLE SURGERY  1998   APPENDECTOMY  1955   BREAST LUMPECTOMY  2011   left   CATARACT EXTRACTION W/PHACO  09/03/2012   Procedure: CATARACT EXTRACTION PHACO AND INTRAOCULAR LENS PLACEMENT (IOC);  Surgeon: Cherene Mania, MD;  Location: [facility];  Service: Ophthalmology;  Laterality: Right;  CDE: 15.26   CATARACT EXTRACTION W/PHACO  09/28/2012   Procedure: CATARACT EXTRACTION PHACO AND INTRAOCULAR LENS PLACEMENT (IOC);  Surgeon: Cherene Mania, MD;  Location: [facility];  Service: Ophthalmology;  Laterality: Left;  CDE:  21.60  CHOLECYSTECTOMY  1990   INGUINAL HERNIA REPAIR Left 11/14/2022   Procedure: INCARCERATED INGUINAL HERNIA REPAIR WITH MESH;  Surgeon: Evonnie Dorothyann LABOR, DO;  Location: [facility];  Service: General;  Laterality: Left;   OPEN REDUCTION INTERNAL FIXATION (ORIF) DISTAL RADIAL FRACTURE Left 07/01/2019   Procedure: OPEN REDUCTION INTERNAL FIXATION (ORIF) LEFT DISTAL RADIAL FRACTURE;  Surgeon: Margrette Taft BRAVO, MD;  Location: [facility];  Service: Orthopedics;  Laterality: Left;   PORT-A-CATH REMOVAL N/A 04/26/2015   Procedure: REMOVAL PORT-A-CATH;  Surgeon: Oneil Budge, MD;  Location: [facility];  Service: General;  Laterality: N/A;   PORTACATH PLACEMENT  05/23/10   THYROID  SURGERY  1994   TONSILLECTOMY     TOTAL HIP ARTHROPLASTY  1996    Prior to Admission medications   Medication Sig Start Date End Date Taking? Authorizing Provider  acetaminophen  (TYLENOL ) 500 MG tablet Take 2 tablets (1,000 mg total) by mouth every 6 (six) hours as needed for mild pain, moderate pain, fever or headache. 11/15/22  Yes Bryn Bernardino NOVAK, MD  albuterol  (VENTOLIN  HFA) 108 (90 Base) MCG/ACT inhaler Inhale 2 puffs into the lungs every 6 (six) hours as needed for wheezing or shortness of breath. 12/21/20  Yes [provider]  amLODipine  (NORVASC ) 5 MG tablet Take 5 mg by mouth daily.    Yes [provider]  Ascorbic Acid (VITAMIN C PO) Take 1 tablet by mouth daily.   Yes [provider]  aspirin  EC 81 MG tablet Take 81 mg by mouth at bedtime.   Yes [provider]  atorvastatin  (LIPITOR) 10 MG tablet Take 10 mg by mouth daily. 08/18/19  Yes [provider]  Calcium -Magnesium -Vitamin D  (CALCIUM  500 PO) Take 1 tablet by mouth every other day.   Yes [provider]  carvedilol  (COREG ) 25 MG tablet Take 25 mg by mouth 2 (two) times daily with a meal.     Yes [provider]  estradiol  (ESTRACE ) 0.1 MG/GM vaginal cream INSERT 1/2 INCH IN APPLICATOR VAGINALLY TWICE WEEKLY. Patient taking differently: Place 1 Applicatorful vaginally 2 (two) times a week. INSERT 1/2 INCH IN APPLICATOR VAGINALLY TWICE WEEKLY. 12/11/23  Yes Dahlstedt, Garnette, MD  furosemide  (LASIX ) 20 MG tablet Take 20 mg by mouth daily as needed for fluid.   Yes [provider]  GOODSENSE CLEARLAX 17 GM/SCOOP powder Take 17 g by mouth daily as needed for mild constipation. 02/04/23  Yes [provider]  hydrocortisone (ANUSOL-HC) 2.5 % rectal cream Apply 1 Application topically 2 (two) times daily. 08/19/22  Yes [provider]   ipratropium-albuterol  (DUONEB) 0.5-2.5 (3) MG/3ML SOLN Take 3 mLs by nebulization every 4 (four) hours as needed (wheezing, shortness of breath). 02/12/24  Yes [provider]  ketoconazole (NIZORAL) 2 % cream Apply 1 Application topically 2 (two) times daily. 02/19/24  Yes [provider]  lactobacillus acidophilus (BACID) TABS tablet Take 1 tablet by mouth daily.   Yes [provider]  levothyroxine  (SYNTHROID , LEVOTHROID) 125 MCG tablet Take 125 mcg by mouth daily.     Yes [provider]  losartan  (COZAAR ) 50 MG tablet Take 50 mg by mouth daily.     Yes [provider]  methenamine  (HIPREX ) 1 g tablet 1/2 tab twice daily Patient taking differently: Take 500 mg by mouth 2 (two) times daily with a meal. 04/08/23  Yes Dahlstedt, Garnette, MD  Multiple Vitamin (MULTIVITAMIN) tablet Take 1 tablet by mouth daily.   Yes [provider]  phenazopyridine (PYRIDIUM) 95 MG  tablet Take 95 mg by mouth 2 (two) times daily.   Yes [provider]  predniSONE  (DELTASONE ) 5 MG tablet Take 5 mg by mouth daily. 02/12/24  Yes [provider]    Current Facility-Administered Medications  Medication Dose Route Frequency Provider Last Rate Last Admin   atorvastatin  (LIPITOR) tablet 10 mg  10 mg Oral QHS Adefeso, Oladapo, DO       carvedilol  (COREG ) tablet 25 mg  25 mg Oral BID WC Adefeso, Oladapo, DO       insulin  aspart (novoLOG ) injection 0-9 Units  0-9 Units Subcutaneous Q4H Adefeso, Oladapo, DO       ipratropium-albuterol  (DUONEB) 0.5-2.5 (3) MG/3ML nebulizer solution 3 mL  3 mL Nebulization Q4H PRN Adefeso, Oladapo, DO       levothyroxine  (SYNTHROID ) tablet 125 mcg  125 mcg Oral Q0600 Adefeso, Oladapo, DO       losartan  (COZAAR ) tablet 50 mg  50 mg Oral Daily Adefeso, Oladapo, DO       Facility-Administered Medications Ordered in Other Encounters  Medication Dose Route Frequency Provider Last Rate Last Admin   sodium chloride  0.9 % injection  10 mL  10 mL Intravenous PRN Lawernce Hacker, MD   10 mL at 05/05/14 1150    Allergies as of 03/02/2024 - Review Complete 03/02/2024  Allergen Reaction Noted   Penicillins Anaphylaxis 02/07/2011   Hctz [hydrochlorothiazide] Other (See Comments) 04/22/2011   Lotrel [amlodipine  besy-benazepril hcl] Other (See Comments) 09/28/2012   Morphine  and codeine  Itching 02/07/2011    Family History  Problem Relation Age of Onset   Kidney disease Mother    Heart disease Father    Hypertension Brother    Cancer Sister        breast and lung    Social History   Socioeconomic History   Marital status: Married    Spouse name: Not on file   Number of children: Not on file   Years of education: Not on file   Highest education level: Not on file  Occupational History   Not on file  Tobacco Use   Smoking status: Former    Current packs/day: 0.00    Average packs/day: 3.0 packs/day for 30.0 years (90.0 ttl pk-yrs)    Types: Cigarettes    Start date: 09/01/1961    Quit date: 09/02/1991    Years since quitting: 32.5   Smokeless tobacco: Never  Vaping Use   Vaping status: Never Used  Substance and Sexual Activity   Alcohol use: No   Drug use: No   Sexual activity: Yes    Birth control/protection: Post-menopausal  Other Topics Concern   Not on file  Social History Narrative   Not on file   Social Drivers of Health   Financial Resource Strain: Not on file  Food Insecurity: No Food Insecurity (11/14/2022)   Hunger Vital Sign    Worried About Running Out of Food in the Last Year: Never true    Ran Out of Food in the Last Year: Never true  Transportation Needs: No Transportation Needs (03/03/2024)   PRAPARE - Administrator, Civil Service (Medical): No    Lack of Transportation (Non-Medical): No  Physical Activity: Not on file  Stress: Not on file  Social Connections: Not on file  Intimate Partner Violence: Not At Risk (03/03/2024)   Humiliation, Afraid, Rape, and Kick  questionnaire    Fear of Current or Ex-Partner: No    Emotionally Abused: No    Physically Abused:  No    Sexually Abused: No     Review of Systems   Gen: Denies any fever, chills, loss of appetite, change in weight or weight loss CV: Denies chest pain, heart palpitations, syncope, edema  Resp: Denies shortness of breath with rest, cough, wheezing, coughing up blood, and pleurisy. GI: Denies vomiting blood, jaundice, and fecal incontinence.   Denies dysphagia or odynophagia. GU :  denies melena, nausea, vomiting, diarrhea, dysphagia, odyonophagia, early satiety or weight loss. +constipation +rectal bleeding MS: Denies joint pain, limitation of movement, swelling, cramps, and atrophy.  Derm: Denies rash, itching, dry skin, hives. Psych: Denies depression, anxiety, memory loss, hallucinations, and confusion. Heme: Denies bruising or bleeding Neuro:  Denies any headaches, dizziness, paresthesias, shaking  Physical Exam   Vital Signs in last 24 hours: Temp:  [98.1 F (36.7 C)-98.7 F (37.1 C)] 98.7 F (37.1 C) (07/02 0430) Pulse Rate:  [61-67] 63 (07/02 0430) Resp:  [18-22] 22 (07/02 0430) BP: (145-184)/(61-75) 184/62 (07/02 0430) SpO2:  [93 %-98 %] 96 % (07/02 0430) FiO2 (%):  [36 %] 36 % (07/02 0105) Weight:  [73.7 kg-79.9 kg] 79.9 kg (07/01 2149) Last BM Date : 03/02/24  General:   Alert,  Well-developed, well-nourished, pleasant and cooperative in NAD Head:  Normocephalic and atraumatic. Eyes:  Sclera clear, no icterus.   Conjunctiva pink. Ears:  Normal auditory acuity. Mouth:  No deformity or lesions, dentition normal. Neck:  Supple; no masses Lungs:  Clear throughout to auscultation.   No wheezes, crackles, or rhonchi. No acute distress. Heart:  Regular rate and rhythm; no murmurs, clicks, rubs,  or gallops. Abdomen:  Soft, nontender and nondistended. No masses, hepatosplenomegaly or hernias noted. Normal bowel sounds, without guarding, and without rebound.   Rectal:  John RN present as witness, no obvious masses, lesions or fissures noted externally, DRE was unremarkable, sphincter tone normal, some hard stool felt in stool vault, patient tolerated exam without issue   Msk:  Symmetrical without gross deformities. Normal posture. Extremities:  Without clubbing or edema. Neurologic:  Alert and  oriented x4. Skin:  Intact without significant lesions or rashes. Psych:  Alert and cooperative. Normal mood and affect.  Intake/Output from previous day: 07/01 0701 - 07/02 0700 In: 0  Out: 500 [Urine:500] Intake/Output this shift: No intake/output data recorded.   Labs/Studies   Recent Labs Recent Labs    03/02/24 1630 03/03/24 0427  WBC 7.0 7.2  HGB 10.1* 9.5*  HCT 31.5* 30.5*  PLT 169 168   BMET Recent Labs    03/02/24 1630 03/03/24 0427  NA 140 144  K 4.2 3.6  CL 94* 94*  CO2 39* 39*  GLUCOSE 215* 100*  BUN 27* 21  CREATININE 0.96 0.81  CALCIUM  10.1 9.6   LFT Recent Labs    03/02/24 1630 03/03/24 0427  PROT 6.8 6.6  ALBUMIN 3.5 3.3*  AST 18 15  ALT 17 14  ALKPHOS 57 48  BILITOT 0.4 0.5   PT/INR No results for input(s): LABPROT, INR in the last 72 hours. Hepatitis Panel No results for input(s): HEPBSAG, HCVAB, HEPAIGM, HEPBIGM in the last 72 hours. C-Diff No results for input(s): CDIFFTOX in the last 72 hours.  Radiology/Studies CT ABDOMEN PELVIS W CONTRAST Result Date: 03/02/2024 CLINICAL DATA:  Abdominal pain. EXAM: CT ABDOMEN AND PELVIS WITH CONTRAST TECHNIQUE: Multidetector CT imaging of the abdomen and pelvis was performed using the standard protocol following bolus administration of intravenous contrast. RADIATION DOSE REDUCTION: This exam was performed according to the  departmental dose-optimization program which includes automated exposure control, adjustment of the mA and/or kV according to patient size and/or use of iterative reconstruction technique. CONTRAST:  OMNIPAQUE  IOHEXOL  300 MG/ML  SOLN  COMPARISON:  CT abdomen pelvis dated 11/14/2022. FINDINGS: Lower chest: The visualized lung bases are clear. There is coronary vascular calcification. No intra-abdominal free air or free fluid. Hepatobiliary: The liver is unremarkable. There is mild dilatation, post cholecystectomy. No retained calcified stone noted in the central CBD. Pancreas: Unremarkable. No pancreatic ductal dilatation or surrounding inflammatory changes. Spleen: Normal in size without focal abnormality. Adrenals/Urinary Tract: The adrenal glands unremarkable. There is no hydronephrosis on either side. There is symmetric enhancement and excretion of contrast by both kidneys. Small left renal posterior lower pole cyst. The urinary bladder is grossly unremarkable. Stomach/Bowel: There is sigmoid diverticulosis. There is moderate stool throughout the colon. There is no bowel obstruction or active inflammation. Appendectomy. Vascular/Lymphatic: Moderate aortoiliac atherosclerotic disease. The IVC is unremarkable. No portal venous gas. There is no adenopathy. Reproductive: The uterus is grossly unremarkable no suspicious adnexal masses. Other: None Musculoskeletal: Osteopenia with degenerative changes of the spine. Total right hip arthroplasty. Moderate arthritic changes of the left hip. No acute osseous pathology. IMPRESSION: 1. No acute intra-abdominal or pelvic pathology. 2. Sigmoid diverticulosis. No bowel obstruction. 3.  Aortic Atherosclerosis (ICD10-I70.0). Electronically Signed   By: Vanetta Chou M.D.   On: 03/02/2024 19:13    Assessment   ICESIS RENN is a 89 y.o. year old female with history of normocytic anemia who presented to the ED with rectal bleeding x3 episodes. GI consulted for further evaluation.  Rectal bleeding: 3 episodes of BRB after BMs, she denied abdominal pain but did note a sensation of pulling or popping around her umbilicus prior to onset. Last TCS was many years ago. She denies any GI symptoms other than  chronic constipation leading up to her episodes of rectal bleeding. She has history of hemorrhoids as well though no obvious hemorrhoids on exam. We discussed colonoscopy to evaluate for bleeding polyps, AVMS, malignancy, as I cannot rule out any of these, though patient declined, she does not wish to pursue this at her age which I think is reasonable given her other multimorbidities. Patient's daughter present during encounter is also in agreement to hold off on colonoscopy at this time. Its possible this was a diverticular bleed which are usually self limiting, hemorrhoids also on the differential. Rectal exam was without obvious hemorrhoids or overt bleeding. We will monitor for any recurrence of bleeding and trend hemoglobin.   Chronic normocytic anemia: chronic since atleast 2011, following with hematology. Hgb stable around baseline of 9.5. normal iron in April.    Plan / Recommendations   Trend h&h, transfuse for hgb <7 Monitor for overt GI bleeding PPI daily Miralax  BID Can use Anusol for hemorrhoids PRN Continue to follow with hematology regarding chronic anemia 7. Heart healthy/carb mod diet today since no plans for endo procedures    03/03/2024, 9:15 AM Yuuki Skeens L. Issaih Kaus, MSN, APRN, AGNP-C Adult-Gerontology Nurse Practitioner [facility] at [facility]

## 2024-03-03 NOTE — Progress Notes (Signed)
 Mobility Specialist Progress Note:    03/03/24 1051  Mobility  Activity Refused mobility   Pt refused mobility, stated I'm too weak. Encouraged pt to ambulate to prevent further weakness, pt still refused. All needs met.   Sherrilee Ditty Mobility Specialist Please contact via Special educational needs teacher or  Rehab office at [phone]

## 2024-03-03 NOTE — ED Provider Notes (Signed)
 [facility] MEDICAL SURGICAL UNIT Provider Note   CSN: [id number] Arrival date & time: 03/02/24  1555     Patient presents with: Hematochezia   Paige Rhodes is a 89 y.o. female.   Patient states she has had 3 episodes of rectal bleeding.  She complains of mild weakness.  The history is provided by the patient and medical records. No language interpreter was used.  Rectal Bleeding Quality:  Bright red Amount:  Moderate Timing:  Constant Chronicity:  New Context: not anal fissures   Similar prior episodes: no   Relieved by:  Nothing Worsened by:  Nothing Ineffective treatments:  None tried Associated symptoms: no abdominal pain        Prior to Admission medications   Medication Sig Start Date End Date Taking? Authorizing Provider  acetaminophen  (TYLENOL ) 500 MG tablet Take 2 tablets (1,000 mg total) by mouth every 6 (six) hours as needed for mild pain, moderate pain, fever or headache. 11/15/22  Yes Bryn Bernardino NOVAK, MD  albuterol  (VENTOLIN  HFA) 108 (90 Base) MCG/ACT inhaler Inhale 2 puffs into the lungs every 6 (six) hours as needed for wheezing or shortness of breath. 12/21/20  Yes [provider]  amLODipine  (NORVASC ) 5 MG tablet Take 5 mg by mouth daily.    Yes [provider]  Ascorbic Acid (VITAMIN C PO) Take 1 tablet by mouth daily.   Yes [provider]  aspirin  EC 81 MG tablet Take 81 mg by mouth at bedtime.   Yes [provider]  atorvastatin  (LIPITOR) 10 MG tablet Take 10 mg by mouth daily. 08/18/19  Yes [provider]  Calcium -Magnesium -Vitamin D  (CALCIUM  500 PO) Take 1 tablet by mouth every other day.   Yes [provider]  carvedilol  (COREG ) 25 MG tablet Take 25 mg by mouth 2 (two) times daily with a meal.     Yes [provider]  estradiol  (ESTRACE ) 0.1 MG/GM vaginal cream INSERT 1/2 INCH IN APPLICATOR VAGINALLY TWICE WEEKLY. Patient taking differently: Place 1 Applicatorful vaginally 2 (two) times a  week. INSERT 1/2 INCH IN APPLICATOR VAGINALLY TWICE WEEKLY. 12/11/23  Yes Dahlstedt, Garnette, MD  furosemide  (LASIX ) 20 MG tablet Take 20 mg by mouth daily as needed for fluid.   Yes [provider]  GOODSENSE CLEARLAX 17 GM/SCOOP powder Take 17 g by mouth daily as needed for mild constipation. 02/04/23  Yes [provider]  hydrocortisone (ANUSOL-HC) 2.5 % rectal cream Apply 1 Application topically 2 (two) times daily. 08/19/22  Yes [provider]  ipratropium-albuterol  (DUONEB) 0.5-2.5 (3) MG/3ML SOLN Take 3 mLs by nebulization every 4 (four) hours as needed (wheezing, shortness of breath). 02/12/24  Yes [provider]  ketoconazole (NIZORAL) 2 % cream Apply 1 Application topically 2 (two) times daily. 02/19/24  Yes [provider]  lactobacillus acidophilus (BACID) TABS tablet Take 1 tablet by mouth daily.   Yes [provider]  levothyroxine  (SYNTHROID , LEVOTHROID) 125 MCG tablet Take 125 mcg by mouth daily.     Yes [provider]  losartan  (COZAAR ) 50 MG tablet Take 50 mg by mouth daily.     Yes [provider]  methenamine  (HIPREX ) 1 g tablet 1/2 tab twice daily Patient taking differently: Take 500 mg by mouth 2 (two) times daily with a meal. 04/08/23  Yes Dahlstedt, Garnette, MD  Multiple Vitamin (MULTIVITAMIN) tablet Take 1 tablet by mouth daily.   Yes [provider]  phenazopyridine (PYRIDIUM) 95 MG tablet Take 95 mg by mouth  2 (two) times daily.   Yes [provider]  predniSONE  (DELTASONE ) 5 MG tablet Take 5 mg by mouth daily. 02/12/24  Yes [provider]    Allergies: Penicillins, Hctz [hydrochlorothiazide], Lotrel [amlodipine  besy-benazepril hcl], and Morphine  and codeine     Review of Systems  Constitutional:  Negative for appetite change and fatigue.  HENT:  Negative for congestion, ear discharge and sinus pressure.   Eyes:  Negative for discharge.  Respiratory:  Negative for cough.    Cardiovascular:  Negative for chest pain.  Gastrointestinal:  Positive for anal bleeding and hematochezia. Negative for abdominal pain and diarrhea.  Genitourinary:  Negative for frequency and hematuria.  Musculoskeletal:  Negative for back pain.  Skin:  Negative for rash.  Neurological:  Negative for seizures and headaches.  Psychiatric/Behavioral:  Negative for hallucinations.     Updated Vital Signs BP (!) 130/55 (BP Location: Right Arm)   Pulse (!) 52   Temp 98.2 F (36.8 C) (Oral)   Resp (!) 24   Ht 5' 2 (1.575 m)   Wt 79.9 kg   SpO2 99%   BMI 32.22 kg/m   Physical Exam Vitals and nursing note reviewed.  Constitutional:      Appearance: She is well-developed.  HENT:     Head: Normocephalic.     Nose: Nose normal.  Eyes:     General: No scleral icterus.    Conjunctiva/sclera: Conjunctivae normal.  Neck:     Thyroid : No thyromegaly.  Cardiovascular:     Rate and Rhythm: Normal rate and regular rhythm.     Heart sounds: No murmur heard.    No friction rub. No gallop.  Pulmonary:     Breath sounds: No stridor. No wheezing or rales.  Chest:     Chest wall: No tenderness.  Abdominal:     General: There is no distension.     Tenderness: There is no abdominal tenderness. There is no rebound.  Genitourinary:    Comments: Heme positive stool Musculoskeletal:        General: Normal range of motion.     Cervical back: Neck supple.  Lymphadenopathy:     Cervical: No cervical adenopathy.  Skin:    Findings: No erythema or rash.  Neurological:     Mental Status: She is alert and oriented to person, place, and time.     Motor: No abnormal muscle tone.     Coordination: Coordination normal.  Psychiatric:        Behavior: Behavior normal.     (all labs ordered are listed, but only abnormal results are displayed) Labs Reviewed  CBC WITH DIFFERENTIAL/PLATELET - Abnormal; Notable for the following components:      Result Value   RBC 3.17 (*)    Hemoglobin 10.1  (*)    HCT 31.5 (*)    All other components within normal limits  COMPREHENSIVE METABOLIC PANEL WITH GFR - Abnormal; Notable for the following components:   Chloride 94 (*)    CO2 39 (*)    Glucose, Bld 215 (*)    BUN 27 (*)    GFR, Estimated 56 (*)    All other components within normal limits  COMPREHENSIVE METABOLIC PANEL WITH GFR - Abnormal; Notable for the following components:   Chloride 94 (*)    CO2 39 (*)    Glucose, Bld 100 (*)    Albumin 3.3 (*)    All other components within normal limits  CBC - Abnormal; Notable for the following components:  RBC 3.11 (*)    Hemoglobin 9.5 (*)    HCT 30.5 (*)    All other components within normal limits  GLUCOSE, CAPILLARY - Abnormal; Notable for the following components:   Glucose-Capillary 103 (*)    All other components within normal limits  GLUCOSE, CAPILLARY - Abnormal; Notable for the following components:   Glucose-Capillary 117 (*)    All other components within normal limits  OCCULT BLOOD, POC DEVICE - Abnormal; Notable for the following components:   Fecal Occult Bld POSITIVE (*)    All other components within normal limits  HEMOGLOBIN A1C  MAGNESIUM   PHOSPHORUS  GLUCOSE, CAPILLARY  HEMOGLOBIN AND HEMATOCRIT, BLOOD  HEMOGLOBIN AND HEMATOCRIT, BLOOD  POC OCCULT BLOOD, ED  TYPE AND SCREEN    EKG: None  Radiology: CT ABDOMEN PELVIS W CONTRAST Result Date: 03/02/2024 CLINICAL DATA:  Abdominal pain. EXAM: CT ABDOMEN AND PELVIS WITH CONTRAST TECHNIQUE: Multidetector CT imaging of the abdomen and pelvis was performed using the standard protocol following bolus administration of intravenous contrast. RADIATION DOSE REDUCTION: This exam was performed according to the departmental dose-optimization program which includes automated exposure control, adjustment of the mA and/or kV according to patient size and/or use of iterative reconstruction technique. CONTRAST:  OMNIPAQUE  IOHEXOL  300 MG/ML  SOLN COMPARISON:  CT abdomen  pelvis dated 11/14/2022. FINDINGS: Lower chest: The visualized lung bases are clear. There is coronary vascular calcification. No intra-abdominal free air or free fluid. Hepatobiliary: The liver is unremarkable. There is mild dilatation, post cholecystectomy. No retained calcified stone noted in the central CBD. Pancreas: Unremarkable. No pancreatic ductal dilatation or surrounding inflammatory changes. Spleen: Normal in size without focal abnormality. Adrenals/Urinary Tract: The adrenal glands unremarkable. There is no hydronephrosis on either side. There is symmetric enhancement and excretion of contrast by both kidneys. Small left renal posterior lower pole cyst. The urinary bladder is grossly unremarkable. Stomach/Bowel: There is sigmoid diverticulosis. There is moderate stool throughout the colon. There is no bowel obstruction or active inflammation. Appendectomy. Vascular/Lymphatic: Moderate aortoiliac atherosclerotic disease. The IVC is unremarkable. No portal venous gas. There is no adenopathy. Reproductive: The uterus is grossly unremarkable no suspicious adnexal masses. Other: None Musculoskeletal: Osteopenia with degenerative changes of the spine. Total right hip arthroplasty. Moderate arthritic changes of the left hip. No acute osseous pathology. IMPRESSION: 1. No acute intra-abdominal or pelvic pathology. 2. Sigmoid diverticulosis. No bowel obstruction. 3.  Aortic Atherosclerosis (ICD10-I70.0). Electronically Signed   By: Vanetta Chou M.D.   On: 03/02/2024 19:13     Procedures   Medications Ordered in the ED  atorvastatin  (LIPITOR) tablet 10 mg (has no administration in time range)  carvedilol  (COREG ) tablet 25 mg (25 mg Oral Given 03/03/24 0932)  ipratropium-albuterol  (DUONEB) 0.5-2.5 (3) MG/3ML nebulizer solution 3 mL (has no administration in time range)  levothyroxine  (SYNTHROID ) tablet 125 mcg (125 mcg Oral Not Given 03/03/24 0611)  losartan  (COZAAR ) tablet 50 mg (50 mg Oral Given 03/03/24  0932)  insulin  aspart (novoLOG ) injection 0-5 Units (has no administration in time range)  insulin  aspart (novoLOG ) injection 0-9 Units (has no administration in time range)  iohexol  (OMNIPAQUE ) 300 MG/ML solution 100 mL (100 mLs Intravenous Contrast Given 03/02/24 1846)  ipratropium-albuterol  (DUONEB) 0.5-2.5 (3) MG/3ML nebulizer solution 3 mL (3 mLs Nebulization Given 03/02/24 2008)                                    Medical  Decision Making Amount and/or Complexity of Data Reviewed Labs: ordered. Radiology: ordered.  Risk Prescription drug management. Decision regarding hospitalization.   Patient with rectal bleeding.  She will be admitted to medicine with GI consult     Final diagnoses:  Rectal bleeding    ED Discharge Orders     None          Suzette Pac, MD 03/03/24 1212

## 2024-03-03 NOTE — Plan of Care (Signed)
  Problem: Education: Goal: Knowledge of General Education information will improve Description: Including pain rating scale, medication(s)/side effects and non-pharmacologic comfort measures 03/03/2024 0632 by Con Isaiah NOVAK, RN Outcome: Progressing 03/03/2024 0631 by Con Isaiah NOVAK, RN Outcome: Progressing   Problem: Health Behavior/Discharge Planning: Goal: Ability to manage health-related needs will improve 03/03/2024 0632 by Con Isaiah NOVAK, RN Outcome: Progressing 03/03/2024 0631 by Con Isaiah NOVAK, RN Outcome: Progressing   Problem: Clinical Measurements: Goal: Ability to maintain clinical measurements within normal limits will improve Outcome: Progressing Goal: Will remain free from infection Outcome: Progressing Goal: Diagnostic test results will improve Outcome: Progressing Goal: Respiratory complications will improve Outcome: Progressing Goal: Cardiovascular complication will be avoided Outcome: Progressing   Problem: Activity: Goal: Risk for activity intolerance will decrease Outcome: Progressing   Problem: Nutrition: Goal: Adequate nutrition will be maintained Outcome: Progressing   Problem: Coping: Goal: Level of anxiety will decrease Outcome: Progressing   Problem: Elimination: Goal: Will not experience complications related to bowel motility Outcome: Progressing Goal: Will not experience complications related to urinary retention Outcome: Progressing   Problem: Pain Managment: Goal: General experience of comfort will improve and/or be controlled Outcome: Progressing   Problem: Safety: Goal: Ability to remain free from injury will improve Outcome: Progressing   Problem: Skin Integrity: Goal: Risk for impaired skin integrity will decrease Outcome: Progressing   Problem: Education: Goal: Ability to describe self-care measures that may prevent or decrease complications (Diabetes Survival Skills Education) will improve Outcome:  Progressing Goal: Individualized Educational Video(s) Outcome: Progressing   Problem: Coping: Goal: Ability to adjust to condition or change in health will improve Outcome: Progressing   Problem: Fluid Volume: Goal: Ability to maintain a balanced intake and output will improve Outcome: Progressing   Problem: Health Behavior/Discharge Planning: Goal: Ability to identify and utilize available resources and services will improve Outcome: Progressing Goal: Ability to manage health-related needs will improve Outcome: Progressing   Problem: Metabolic: Goal: Ability to maintain appropriate glucose levels will improve Outcome: Progressing   Problem: Nutritional: Goal: Maintenance of adequate nutrition will improve Outcome: Progressing Goal: Progress toward achieving an optimal weight will improve Outcome: Progressing   Problem: Skin Integrity: Goal: Risk for impaired skin integrity will decrease Outcome: Progressing   Problem: Tissue Perfusion: Goal: Adequacy of tissue perfusion will improve Outcome: Progressing

## 2024-03-03 NOTE — Progress Notes (Signed)
 [facility]                                                                              Paige Rhodes, is a 89 y.o. female, DOB - Sep 30, 1931, FMW:[id number] Admit date - 03/02/2024    Outpatient Primary MD for the patient is [name], MD  LOS - 0  days  Chief Complaint  Patient presents with   Hematochezia       Brief summary   Patient is a 89 year old female with HTN, HLP, DM type II, COPD with chronic respiratory failure on 4 L O2 via [place] at baseline, hypothyroidism, GERD, breast CA status postlumpectomy, CKD stage IIIa presented to ED with rectal bleeding.  Patient reported 3 episodes of bowel movement with bright red blood in the commode, no abdominal pain, fevers or chills. FOBT positive, CT abdomen pelvis showed no acute intra-abdominal or pelvic pathology, sigmoid diverticulosis no bowel obstruction.  Assessment & Plan    Principal Problem:   Rectal bleed BRBPR - Likely due to diverticulosis, no abdominal pain, fevers or chills, rule out any polyps, AVMs or malignancy. -GI consulted, will follow recommendations regarding colonoscopy/flex sig -Continue serial H&H, PPI daily  Active Problems:   Acquired hypothyroidism -Continue Synthroid     Mixed hyperlipidemia -Continue Lipitor    Essential hypertension -BP currently stable    COPD (chronic obstructive pulmonary disease) (HCC),  Chronic respiratory failure with hypoxia (HCC) - Currently no acute wheezing, continue nebs, O2 4 L    Type 2 diabetes mellitus with hyperglycemia (HCC) - Continue sliding scale insulin  while inpatient Hemoglobin A1c 5.4   CKD stage 3A - Creatinine at baseline  Obesity class 1  Estimated body mass index is 32.22 kg/m as calculated from the following:   Height as of this encounter: 5' 2 (1.575 m).   Weight as of this encounter: 79.9 kg.  Code Status: Full CODE STATUS DVT Prophylaxis:  SCDs Start: 03/03/24 0105   Level of Care: Level of care: Med-Surg Family  Communication: Updated patient Disposition Plan:      Remains inpatient appropriate:      Procedures:    Consultants:   Gastroenterology  Antimicrobials:   Anti-infectives (From admission, onward)    None          Medications  atorvastatin   10 mg Oral QHS   carvedilol   25 mg Oral BID WC   insulin  aspart  0-9 Units Subcutaneous Q4H   levothyroxine   125 mcg Oral Q0600   losartan   50 mg Oral Daily      Subjective:   Paige Rhodes was seen and examined today.  Sitting up in the chair, no acute complaints, no ongoing nausea vomiting abdominal pain or any active bleeding.    Objective:   Vitals:   03/02/24 2149 03/03/24 0105 03/03/24 0130 03/03/24 0430  BP: (!) 177/71  (!) 145/75 (!) 184/62  Pulse: 65  66 63  Resp: 20  18 (!) 22  Temp: 98.2 F (36.8 C)  98.1 F (36.7 C) 98.7 F (37.1 C)  TempSrc:   Oral Oral  SpO2: 94% 96% 95% 96%  Weight: 79.9 kg  Height:        Intake/Output Summary (Last 24 hours) at 03/03/2024 1046 Last data filed at 03/03/2024 0500 Gross per 24 hour  Intake 0 ml  Output 500 ml  Net -500 ml     Wt Readings from Last 3 Encounters:  03/02/24 79.9 kg  05/12/23 73.7 kg  11/26/22 73.9 kg     Exam General: Alert and oriented x 3, NAD Cardiovascular: S1 S2 auscultated,  RRR Respiratory: Clear to auscultation bilaterally, no wheezing Gastrointestinal: Soft, nontender, nondistended, + bowel sounds Ext: no pedal edema bilaterally Neuro: no new deficits Psych: Normal affect     Data Reviewed:  I have personally reviewed following labs    CBC Lab Results  Component Value Date   WBC 7.2 03/03/2024   RBC 3.11 (L) 03/03/2024   HGB 9.5 (L) 03/03/2024   HCT 30.5 (L) 03/03/2024   MCV 98.1 03/03/2024   MCH 30.5 03/03/2024   PLT 168 03/03/2024   MCHC 31.1 03/03/2024   RDW 13.3 03/03/2024   LYMPHSABS 1.1 03/02/2024   MONOABS 0.4 03/02/2024   EOSABS 0.1 03/02/2024   BASOSABS 0.0 03/02/2024     Last metabolic panel Lab  Results  Component Value Date   NA 144 03/03/2024   K 3.6 03/03/2024   CL 94 (L) 03/03/2024   CO2 39 (H) 03/03/2024   BUN 21 03/03/2024   CREATININE 0.81 03/03/2024   GLUCOSE 100 (H) 03/03/2024   GFRNONAA >60 03/03/2024   GFRAA >60 08/31/2019   CALCIUM  9.6 03/03/2024   PHOS 3.5 03/03/2024   PROT 6.6 03/03/2024   ALBUMIN 3.3 (L) 03/03/2024   LABGLOB 3.5 08/11/2023   AGRATIO 1.1 08/11/2023   BILITOT 0.5 03/03/2024   ALKPHOS 48 03/03/2024   AST 15 03/03/2024   ALT 14 03/03/2024   ANIONGAP 11 03/03/2024    CBG (last 3)  Recent Labs    03/03/24 0427 03/03/24 0744  GLUCAP 103* 95      Coagulation Profile: No results for input(s): INR, PROTIME in the last 168 hours.   Radiology Studies: I have personally reviewed the imaging studies  CT ABDOMEN PELVIS W CONTRAST Result Date: 03/02/2024 CLINICAL DATA:  Abdominal pain. EXAM: CT ABDOMEN AND PELVIS WITH CONTRAST TECHNIQUE: Multidetector CT imaging of the abdomen and pelvis was performed using the standard protocol following bolus administration of intravenous contrast. RADIATION DOSE REDUCTION: This exam was performed according to the departmental dose-optimization program which includes automated exposure control, adjustment of the mA and/or kV according to patient size and/or use of iterative reconstruction technique. CONTRAST:  OMNIPAQUE  IOHEXOL  300 MG/ML  SOLN COMPARISON:  CT abdomen pelvis dated 11/14/2022. FINDINGS: Lower chest: The visualized lung bases are clear. There is coronary vascular calcification. No intra-abdominal free air or free fluid. Hepatobiliary: The liver is unremarkable. There is mild dilatation, post cholecystectomy. No retained calcified stone noted in the central CBD. Pancreas: Unremarkable. No pancreatic ductal dilatation or surrounding inflammatory changes. Spleen: Normal in size without focal abnormality. Adrenals/Urinary Tract: The adrenal glands unremarkable. There is no hydronephrosis on either  side. There is symmetric enhancement and excretion of contrast by both kidneys. Small left renal posterior lower pole cyst. The urinary bladder is grossly unremarkable. Stomach/Bowel: There is sigmoid diverticulosis. There is moderate stool throughout the colon. There is no bowel obstruction or active inflammation. Appendectomy. Vascular/Lymphatic: Moderate aortoiliac atherosclerotic disease. The IVC is unremarkable. No portal venous gas. There is no adenopathy. Reproductive: The uterus is grossly unremarkable no suspicious adnexal masses. Other: None  Musculoskeletal: Osteopenia with degenerative changes of the spine. Total right hip arthroplasty. Moderate arthritic changes of the left hip. No acute osseous pathology. IMPRESSION: 1. No acute intra-abdominal or pelvic pathology. 2. Sigmoid diverticulosis. No bowel obstruction. 3.  Aortic Atherosclerosis (ICD10-I70.0). Electronically Signed   By: Vanetta Chou M.D.   On: 03/02/2024 19:13       Joana Nolton M.D. [facility] 03/03/2024, 10:46 AM  Available via Epic secure chat 7am-7pm After 7 pm, please refer to night coverage provider listed on amion.

## 2024-03-03 NOTE — Plan of Care (Signed)

## 2024-03-03 NOTE — Plan of Care (Signed)
   Problem: Activity: Goal: Risk for activity intolerance will decrease Outcome: Progressing   Problem: Coping: Goal: Level of anxiety will decrease Outcome: Progressing

## 2024-03-04 ENCOUNTER — Encounter (HOSPITAL_COMMUNITY)
Admission: EM | Disposition: A | Discharge status: Hospice | Payer: Self-pay | Source: Home / Self Care | Attending: Internal Medicine

## 2024-03-04 ENCOUNTER — Inpatient Hospital Stay (HOSPITAL_COMMUNITY)

## 2024-03-04 ENCOUNTER — Encounter (HOSPITAL_COMMUNITY): Payer: Self-pay | Admitting: Internal Medicine

## 2024-03-04 DIAGNOSIS — J9611 Chronic respiratory failure with hypoxia: Secondary | ICD-10-CM

## 2024-03-04 DIAGNOSIS — D128 Benign neoplasm of rectum: Secondary | ICD-10-CM

## 2024-03-04 DIAGNOSIS — D125 Benign neoplasm of sigmoid colon: Secondary | ICD-10-CM | POA: Diagnosis not present

## 2024-03-04 DIAGNOSIS — N183 Chronic kidney disease, stage 3 unspecified: Secondary | ICD-10-CM

## 2024-03-04 DIAGNOSIS — K6289 Other specified diseases of anus and rectum: Secondary | ICD-10-CM | POA: Diagnosis not present

## 2024-03-04 DIAGNOSIS — D123 Benign neoplasm of transverse colon: Secondary | ICD-10-CM

## 2024-03-04 DIAGNOSIS — J410 Simple chronic bronchitis: Secondary | ICD-10-CM | POA: Diagnosis not present

## 2024-03-04 DIAGNOSIS — K5730 Diverticulosis of large intestine without perforation or abscess without bleeding: Secondary | ICD-10-CM

## 2024-03-04 DIAGNOSIS — I129 Hypertensive chronic kidney disease with stage 1 through stage 4 chronic kidney disease, or unspecified chronic kidney disease: Secondary | ICD-10-CM

## 2024-03-04 DIAGNOSIS — E039 Hypothyroidism, unspecified: Secondary | ICD-10-CM | POA: Diagnosis not present

## 2024-03-04 DIAGNOSIS — K625 Hemorrhage of anus and rectum: Secondary | ICD-10-CM | POA: Diagnosis not present

## 2024-03-04 HISTORY — PX: COLONOSCOPY: SHX5424

## 2024-03-04 LAB — HEMOGLOBIN AND HEMATOCRIT, BLOOD
HCT: 31.9 % — ABNORMAL LOW (ref 36.0–46.0)
HCT: 32.2 % — ABNORMAL LOW (ref 36.0–46.0)
Hemoglobin: 10 g/dL — ABNORMAL LOW (ref 12.0–15.0)
Hemoglobin: 10.2 g/dL — ABNORMAL LOW (ref 12.0–15.0)

## 2024-03-04 LAB — RENAL FUNCTION PANEL
Albumin: 3.4 g/dL — ABNORMAL LOW (ref 3.5–5.0)
Anion gap: 10 (ref 5–15)
BUN: 24 mg/dL — ABNORMAL HIGH (ref 8–23)
CO2: 36 mmol/L — ABNORMAL HIGH (ref 22–32)
Calcium: 8.9 mg/dL (ref 8.9–10.3)
Chloride: 94 mmol/L — ABNORMAL LOW (ref 98–111)
Creatinine, Ser: 1.03 mg/dL — ABNORMAL HIGH (ref 0.44–1.00)
GFR, Estimated: 52 mL/min — ABNORMAL LOW (ref >60)
Glucose, Bld: 78 mg/dL (ref 70–99)
Phosphorus: 4.3 mg/dL (ref 2.5–4.6)
Potassium: 3.8 mmol/L (ref 3.5–5.1)
Sodium: 140 mmol/L (ref 135–145)

## 2024-03-04 LAB — GLUCOSE, CAPILLARY
Glucose-Capillary: 144 mg/dL — ABNORMAL HIGH (ref 70–99)
Glucose-Capillary: 256 mg/dL — ABNORMAL HIGH (ref 70–99)
Glucose-Capillary: 72 mg/dL (ref 70–99)
Glucose-Capillary: 82 mg/dL (ref 70–99)
Glucose-Capillary: 83 mg/dL (ref 70–99)
Glucose-Capillary: 84 mg/dL (ref 70–99)
Glucose-Capillary: 87 mg/dL (ref 70–99)
Glucose-Capillary: 92 mg/dL (ref 70–99)

## 2024-03-04 SURGERY — COLONOSCOPY
Anesthesia: General

## 2024-03-04 MED ORDER — PROPOFOL 500 MG/50ML IV EMUL
INTRAVENOUS | Status: DC | PRN
  Administered 2024-03-04: 20 mg via INTRAVENOUS
  Administered 2024-03-04: 30 mg via INTRAVENOUS
  Administered 2024-03-04: 50 ug/kg/min via INTRAVENOUS

## 2024-03-04 MED ORDER — SODIUM CHLORIDE 0.9 % IV SOLN: INTRAVENOUS | Status: DC

## 2024-03-04 MED ORDER — LACTATED RINGERS IV SOLN: INTRAVENOUS | Status: DC | PRN

## 2024-03-04 MED ORDER — PROPOFOL 10 MG/ML IV BOLUS
INTRAVENOUS | Status: AC
  Filled 2024-03-04: qty 20

## 2024-03-04 NOTE — Op Note (Addendum)
 [facility] Patient Name: Paige Rhodes Procedure Date: 03/04/2024 2:33 PM MRN: [id number] Date of Birth: 06-09-1932 Attending MD: [name] Dine , MD, [id number] CSN: [id number] Age: 89 Admit Type: Ambulatory Procedure:                Colonoscopy Indications:              Hematochezia Providers:                [name] Dine, MD, Rosina Sprague, Leandrew Edelman RN,                            RN, Gordy Lonni Balm, Technician Referring MD:              Medicines:                Monitored Anesthesia Care Complications:            No immediate complications. Estimated Blood Loss:     Estimated blood loss was minimal. Procedure:                Pre-Anesthesia Assessment:                           - Prior to the procedure, a History and Physical                            was performed, and patient medications and                            allergies were reviewed. The patient's tolerance of                            previous anesthesia was also reviewed. The risks                            and benefits of the procedure and the sedation                            options and risks were discussed with the patient.                            All questions were answered, and informed consent                            was obtained. Prior Anticoagulants: The patient has                            taken no anticoagulant or antiplatelet agents. ASA                            Grade Assessment: III - A patient with severe                            systemic disease. After reviewing the risks and  benefits, the patient was deemed in satisfactory                            condition to undergo the procedure.                           After obtaining informed consent, the colonoscope                            was passed under direct vision. Throughout the                            procedure, the patient's blood pressure, pulse, and                            oxygen   saturations were monitored continuously. The                            [id number]-[id number] ([id number]) scope was introduced through                            the anus and advanced to the the cecum, identified                            by appendiceal orifice and ileocecal valve. The                            colonoscopy was performed without difficulty. The                            patient tolerated the procedure well. The quality                            of the bowel preparation was evaluated using the                            BBPS [facility] Bowel Preparation Scale) with scores                            of: Right Colon = 3, Transverse Colon = 3 and Left                            Colon = 3 (entire mucosa seen well with no residual                            staining, small fragments of stool or opaque                            liquid). The total BBPS score equals 9. The                            ileocecal valve, appendiceal orifice, and rectum  were photographed. Scope In: 2:42:50 PM Scope Out: 3:12:46 PM Scope Withdrawal Time: 0 hours 25 minutes 30 seconds  Total Procedure Duration: 0 hours 29 minutes 56 seconds  Findings:      The digital rectal exam revealed a 3 cm (diameter) rubbery-textured,       hard and mobile rectal mass.      Two sessile polyps were found in the transverse colon. The polyps were 4       to 6 mm in size. These polyps were removed with a cold snare. Resection       and retrieval were complete.      A 15 mm polyp was found in the sigmoid colon. The polyp was sessile. The       polyp was removed with a cold snare. Resection and retrieval were       complete.      Scattered diverticula were found in the left colon.      A frond-like/villous and ulcerated non-obstructing mass was found in the       rectum. The mass was non-circumferential. In addition, its diameter       measured thirty mm. Oozing was present. This was biopsied with a cold        large-capacity forceps for histology. Impression:               - Rectal mass.                           - Two 4 to 6 mm polyps in the transverse colon,                            removed with a cold snare. Resected and retrieved.                           - One 15 mm polyp in the sigmoid colon, removed                            with a cold snare. Resected and retrieved.                           - Diverticulosis in the left colon.                           - Likely malignant tumor in the rectum. Biopsied x8                            -Jumbo forcep . Moderate Sedation:      Per Anesthesia Care Recommendation:           Follow up path                           Oncology and colorectal surgery if family and                            patient choose to pursue treatment                           Can restart diet as tolerated Procedure Code(s):        ---  Professional ---                           [id number], Colonoscopy, flexible; with removal of                            tumor(s), polyp(s), or other lesion(s) by snare                            technique                           45380, 59, Colonoscopy, flexible; with biopsy,                            single or multiple Diagnosis Code(s):        --- Professional ---                           K62.89, Other specified diseases of anus and rectum                           D12.3, Benign neoplasm of transverse colon (hepatic                            flexure or splenic flexure)                           D12.5, Benign neoplasm of sigmoid colon                           D49.0, Neoplasm of unspecified behavior of                            digestive system                           K92.1, Melena (includes Hematochezia)                           K57.30, Diverticulosis of large intestine without                            perforation or abscess without bleeding CPT copyright 2022 American Medical Association. All rights reserved. The codes  documented in this report are preliminary and upon coder review may  be revised to meet current compliance requirements. [name] Dine, MD [name] Dine, MD 03/04/2024 3:23:03 PM This report has been signed electronically. Number of Addenda: 0

## 2024-03-04 NOTE — Anesthesia Preprocedure Evaluation (Addendum)
 Anesthesia Evaluation  Patient identified by MRN, date of birth, ID band Patient awake    Reviewed: Allergy & Precautions, H&P , NPO status , Patient's Chart, lab work & pertinent test results  Airway Mallampati: II  TM Distance: >3 FB Neck ROM: Full    Dental no notable dental hx.    Pulmonary COPD,  oxygen  dependent, former smoker    + decreased breath sounds      Cardiovascular hypertension, Normal cardiovascular exam Rhythm:Regular Rate:Normal     Neuro/Psych negative neurological ROS  negative psych ROS   GI/Hepatic Neg liver ROS,GERD  ,,  Endo/Other  diabetesHypothyroidism    Renal/GU Renal disease Stage 3 CKD  negative genitourinary   Musculoskeletal negative musculoskeletal ROS (+)    Abdominal   Peds negative pediatric ROS (+)  Hematology  (+) Blood dyscrasia, anemia   Anesthesia Other Findings   Reproductive/Obstetrics negative OB ROS                              Anesthesia Physical Anesthesia Plan  ASA: 4  Anesthesia Plan: General   Post-op Pain Management:    Induction: Intravenous  PONV Risk Score and Plan: Propofol  infusion  Airway Management Planned: Nasal Cannula  Additional Equipment:   Intra-op Plan:   Post-operative Plan:   Informed Consent: I have reviewed the patients History and Physical, chart, labs and discussed the procedure including the risks, benefits and alternatives for the proposed anesthesia with the patient or authorized representative who has indicated his/her understanding and acceptance.     Dental advisory given  Plan Discussed with: CRNA  Anesthesia Plan Comments:         Anesthesia Quick Evaluation

## 2024-03-04 NOTE — Brief Op Note (Addendum)
 Patient underwent Colonoscopy under propofol  sedation.  Tolerated the procedure adequately.   FINDINGS:  - Rectal mass.   - Two 4 to 6 mm polyps in the transverse colon, removed with a cold snare. Resected and retrieved.   - One 15 mm polyp in the sigmoid colon, removed with a cold snare. Resected and retrieved.   - Diverticulosis in the left colon.   - Likely malignant tumor in the rectum. Biopsied x8 - Jumbo forcep .   RECOMMENDATIONS  Follow up path   Oncology and colorectal surgery if family and patient choose to pursue treatment   Can restart diet as tolerated  Spoke to the patient daughter in detail ETTER Lacy) who also report that grandson was diagnosed with CRC age 54 and passed away age 36  Recall GI with any further questions  Cerra Eisenhower Faizan Lavaya Defreitas, MD Gastroenterology and Hepatology [facility]

## 2024-03-04 NOTE — Progress Notes (Signed)
 [facility]                                                                              Paige Rhodes, is a 89 y.o. female, DOB - 1932/06/27, FMW:[id number] Admit date - 03/02/2024    Outpatient Primary MD for the patient is [name], MD  LOS - 1  days  Chief Complaint  Patient presents with   Hematochezia       Brief summary   Patient is a 89 year old female with HTN, HLP, DM type II, COPD with chronic respiratory failure on 4 L O2 via [place] at baseline, hypothyroidism, GERD, breast CA status postlumpectomy, CKD stage IIIa presented to ED with rectal bleeding.  Patient reported 3 episodes of bowel movement with bright red blood in the commode, no abdominal pain, fevers or chills. FOBT positive, CT abdomen pelvis showed no acute intra-abdominal or pelvic pathology, sigmoid diverticulosis no bowel obstruction.  Assessment & Plan    Principal Problem:   Rectal bleed BRBPR - Likely due to diverticulosis, no abdominal pain, fevers or chills, rule out any polyps, AVMs or malignancy. -GI following, plan for colonoscopy today, n.p.o. - H&H stable, no active bleeding  Active Problems:   Acquired hypothyroidism -Continue Synthroid     Mixed hyperlipidemia -Continue Lipitor    Essential hypertension -BP currently stable    COPD (chronic obstructive pulmonary disease) (HCC),  Chronic respiratory failure with hypoxia (HCC) - Currently no acute wheezing, continue nebs, O2 4 L    Type 2 diabetes mellitus with hyperglycemia (HCC) Hemoglobin A1c 5.4 CBG (last 3)  Recent Labs    03/04/24 0415 03/04/24 0705 03/04/24 1122  GLUCAP 83 92 82   Continue sliding scale insulin , sensitive   CKD stage 3A - Creatinine at baseline  Obesity class 1  Estimated body mass index is 32.22 kg/m as calculated from the following:   Height as of this encounter: 5' 2 (1.575 m).   Weight as of this encounter: 79.9 kg.  Code Status: Full CODE STATUS DVT Prophylaxis:  SCDs Start:  03/03/24 0105   Level of Care: Level of care: Med-Surg Family Communication: Updated patient Disposition Plan:      Remains inpatient appropriate:   Hopefully DC home tomorrow if colonoscopy done today and no acute issues   Procedures:    Consultants:   Gastroenterology  Antimicrobials:   Anti-infectives (From admission, onward)    None          Medications  atorvastatin   10 mg Oral QHS   carvedilol   25 mg Oral BID WC   insulin  aspart  0-5 Units Subcutaneous QHS   insulin  aspart  0-9 Units Subcutaneous TID WC   ipratropium-albuterol   3 mL Nebulization TID   levothyroxine   125 mcg Oral Q0600   losartan   50 mg Oral Daily      Subjective:   Paige Rhodes was seen and examined today.  Sitting up in the chair, no acute complaints, no ongoing nausea vomiting abdominal pain or any active bleeding.    Objective:   Vitals:   03/03/24 2007 03/03/24 2026 03/04/24 0450 03/04/24 0746  BP:  (!) 151/60 (!) 180/80  Pulse:  (!) 55 65   Resp:  19 19   Temp:  98.7 F (37.1 C) 98.6 F (37 C)   TempSrc:   Oral   SpO2: 97% 96% 95% 96%  Weight:      Height:        Intake/Output Summary (Last 24 hours) at 03/04/2024 1300 Last data filed at 03/04/2024 0648 Gross per 24 hour  Intake --  Output 200 ml  Net -200 ml     Wt Readings from Last 3 Encounters:  03/02/24 79.9 kg  05/12/23 73.7 kg  11/26/22 73.9 kg   Physical Exam General: Alert and oriented x 3, NAD Cardiovascular: S1 S2 clear, RRR.  Respiratory: CTAB, no wheezing Gastrointestinal: Soft, nontender, nondistended, NBS Ext: no pedal edema bilaterally Neuro: no new deficits Psych: Normal affect      Data Reviewed:  I have personally reviewed following labs    CBC Lab Results  Component Value Date   WBC 7.2 03/03/2024   RBC 3.11 (L) 03/03/2024   HGB 10.0 (L) 03/04/2024   HCT 31.9 (L) 03/04/2024   MCV 98.1 03/03/2024   MCH 30.5 03/03/2024   PLT 168 03/03/2024   MCHC 31.1 03/03/2024   RDW 13.3  03/03/2024   LYMPHSABS 1.1 03/02/2024   MONOABS 0.4 03/02/2024   EOSABS 0.1 03/02/2024   BASOSABS 0.0 03/02/2024     Last metabolic panel Lab Results  Component Value Date   NA 140 03/04/2024   K 3.8 03/04/2024   CL 94 (L) 03/04/2024   CO2 36 (H) 03/04/2024   BUN 24 (H) 03/04/2024   CREATININE 1.03 (H) 03/04/2024   GLUCOSE 78 03/04/2024   GFRNONAA 52 (L) 03/04/2024   GFRAA >60 08/31/2019   CALCIUM  8.9 03/04/2024   PHOS 4.3 03/04/2024   PROT 6.6 03/03/2024   ALBUMIN 3.4 (L) 03/04/2024   LABGLOB 3.5 08/11/2023   AGRATIO 1.1 08/11/2023   BILITOT 0.5 03/03/2024   ALKPHOS 48 03/03/2024   AST 15 03/03/2024   ALT 14 03/03/2024   ANIONGAP 10 03/04/2024    CBG (last 3)  Recent Labs    03/04/24 0415 03/04/24 0705 03/04/24 1122  GLUCAP 83 92 82      Coagulation Profile: No results for input(s): INR, PROTIME in the last 168 hours.   Radiology Studies: I have personally reviewed the imaging studies  CT ABDOMEN PELVIS W CONTRAST Result Date: 03/02/2024 CLINICAL DATA:  Abdominal pain. EXAM: CT ABDOMEN AND PELVIS WITH CONTRAST TECHNIQUE: Multidetector CT imaging of the abdomen and pelvis was performed using the standard protocol following bolus administration of intravenous contrast. RADIATION DOSE REDUCTION: This exam was performed according to the departmental dose-optimization program which includes automated exposure control, adjustment of the mA and/or kV according to patient size and/or use of iterative reconstruction technique. CONTRAST:  OMNIPAQUE  IOHEXOL  300 MG/ML  SOLN COMPARISON:  CT abdomen pelvis dated 11/14/2022. FINDINGS: Lower chest: The visualized lung bases are clear. There is coronary vascular calcification. No intra-abdominal free air or free fluid. Hepatobiliary: The liver is unremarkable. There is mild dilatation, post cholecystectomy. No retained calcified stone noted in the central CBD. Pancreas: Unremarkable. No pancreatic ductal dilatation or  surrounding inflammatory changes. Spleen: Normal in size without focal abnormality. Adrenals/Urinary Tract: The adrenal glands unremarkable. There is no hydronephrosis on either side. There is symmetric enhancement and excretion of contrast by both kidneys. Small left renal posterior lower pole cyst. The urinary bladder is grossly unremarkable. Stomach/Bowel: There is sigmoid diverticulosis. There is moderate  stool throughout the colon. There is no bowel obstruction or active inflammation. Appendectomy. Vascular/Lymphatic: Moderate aortoiliac atherosclerotic disease. The IVC is unremarkable. No portal venous gas. There is no adenopathy. Reproductive: The uterus is grossly unremarkable no suspicious adnexal masses. Other: None Musculoskeletal: Osteopenia with degenerative changes of the spine. Total right hip arthroplasty. Moderate arthritic changes of the left hip. No acute osseous pathology. IMPRESSION: 1. No acute intra-abdominal or pelvic pathology. 2. Sigmoid diverticulosis. No bowel obstruction. 3.  Aortic Atherosclerosis (ICD10-I70.0). Electronically Signed   By: Vanetta Chou M.D.   On: 03/02/2024 19:13       Lianny Molter M.D. [facility] 03/04/2024, 1:00 PM  Available via Epic secure chat 7am-7pm After 7 pm, please refer to night coverage provider listed on amion.

## 2024-03-04 NOTE — Interval H&P Note (Signed)
 History and Physical Interval Note:  03/04/2024 2:32 PM  Paige Rhodes  has presented today for surgery, with the diagnosis of rectal bleeding, chronic anemia.  The various methods of treatment have been discussed with the patient and family. After consideration of risks, benefits and other options for treatment, the patient has consented to  Procedure(s): COLONOSCOPY (N/A) as a surgical intervention.  The patient's history has been reviewed, patient examined, no change in status, stable for surgery.  I have reviewed the patient's chart and labs.  Questions were answered to the patient's satisfaction.    We will proceed with Colonoscopy as scheduled.    Discussed with patient daughter Mirian) as well in person who also verbalizes understanding that patient intraprocedural risk from anesthesia is higher because of advance age and medical co-morbidities . They would still like to proceed to investigate the eitology of hematochezia , last episode this morning   I thoroughly discussed with the patient the procedure, including the risks involved. Patient understands what the procedure involves including the benefits and any risks. Patient understands alternatives to the proposed procedure. Risks including (but not limited to) bleeding, tearing of the lining (perforation), rupture of adjacent organs, problems with heart and lung function, infection, and medication reactions. A small percentage of complications may require surgery, hospitalization, repeat endoscopic procedure, and/or transfusion.  Patient understood and agreed.    [name] JULIANNA Deacon Gadbois

## 2024-03-04 NOTE — Plan of Care (Signed)

## 2024-03-04 NOTE — Anesthesia Postprocedure Evaluation (Signed)
 Anesthesia Post Note  Patient: Fontaine F Pennebaker  Procedure(s) Performed: COLONOSCOPY  Patient location during evaluation: PACU Anesthesia Type: General Level of consciousness: awake and alert Pain management: pain level controlled Vital Signs Assessment: post-procedure vital signs reviewed and stable Respiratory status: spontaneous breathing, nonlabored ventilation, respiratory function stable and patient connected to nasal cannula oxygen  Cardiovascular status: stable and blood pressure returned to baseline Postop Assessment: no apparent nausea or vomiting Anesthetic complications: no   No notable events documented.   Last Vitals:  Vitals:   03/04/24 1541 03/04/24 1545  BP:  (!) 177/63  Pulse: 72 66  Resp: (!) 25 (!) 24  Temp:  36.5 C  SpO2: 96% 93%    Last Pain:  Vitals:   03/04/24 1545  TempSrc:   PainSc: 0-No pain                 Andrea Limes

## 2024-03-04 NOTE — Transfer of Care (Signed)
 Immediate Anesthesia Transfer of Care Note  Patient: Paige Rhodes  Procedure(s) Performed: COLONOSCOPY  Patient Location: PACU  Anesthesia Type:General  Level of Consciousness: awake, alert , and oriented  Airway & Oxygen  Therapy: Patient Spontanous Breathing and Patient connected to nasal cannula oxygen   Post-op Assessment: Report given to RN and Post -op Vital signs reviewed and stable  Post vital signs: Reviewed and stable  Last Vitals:  Vitals Value Taken Time  BP 119/72   Temp 97.7   Pulse 63   Resp 16   SpO2 96%     Last Pain:  Vitals:   03/04/24 1436  TempSrc:   PainSc: 0-No pain         Complications: No notable events documented.

## 2024-03-05 ENCOUNTER — Encounter (HOSPITAL_COMMUNITY): Payer: Self-pay | Admitting: Internal Medicine

## 2024-03-05 DIAGNOSIS — K625 Hemorrhage of anus and rectum: Secondary | ICD-10-CM

## 2024-03-05 DIAGNOSIS — K6289 Other specified diseases of anus and rectum: Secondary | ICD-10-CM | POA: Diagnosis not present

## 2024-03-05 LAB — RENAL FUNCTION PANEL
Albumin: 3.1 g/dL — ABNORMAL LOW (ref 3.5–5.0)
Anion gap: 7 (ref 5–15)
BUN: 31 mg/dL — ABNORMAL HIGH (ref 8–23)
CO2: 37 mmol/L — ABNORMAL HIGH (ref 22–32)
Calcium: 8.3 mg/dL — ABNORMAL LOW (ref 8.9–10.3)
Chloride: 95 mmol/L — ABNORMAL LOW (ref 98–111)
Creatinine, Ser: 1.15 mg/dL — ABNORMAL HIGH (ref 0.44–1.00)
GFR, Estimated: 45 mL/min — ABNORMAL LOW (ref >60)
Glucose, Bld: 72 mg/dL (ref 70–99)
Phosphorus: 4.2 mg/dL (ref 2.5–4.6)
Potassium: 4.2 mmol/L (ref 3.5–5.1)
Sodium: 139 mmol/L (ref 135–145)

## 2024-03-05 LAB — GLUCOSE, CAPILLARY
Glucose-Capillary: 136 mg/dL — ABNORMAL HIGH (ref 70–99)
Glucose-Capillary: 181 mg/dL — ABNORMAL HIGH (ref 70–99)
Glucose-Capillary: 210 mg/dL — ABNORMAL HIGH (ref 70–99)
Glucose-Capillary: 96 mg/dL (ref 70–99)
Glucose-Capillary: 99 mg/dL (ref 70–99)

## 2024-03-05 LAB — CBC
HCT: 30.2 % — ABNORMAL LOW (ref 36.0–46.0)
Hemoglobin: 9.2 g/dL — ABNORMAL LOW (ref 12.0–15.0)
MCH: 30.2 pg (ref 26.0–34.0)
MCHC: 30.5 g/dL (ref 30.0–36.0)
MCV: 99 fL (ref 80.0–100.0)
Platelets: 170 K/uL (ref 150–400)
RBC: 3.05 MIL/uL — ABNORMAL LOW (ref 3.87–5.11)
RDW: 13.7 % (ref 11.5–15.5)
WBC: 7.2 K/uL (ref 4.0–10.5)
nRBC: 0 % (ref 0.0–0.2)

## 2024-03-05 MED ORDER — MELATONIN 3 MG PO TABS
6.0000 mg | ORAL_TABLET | Freq: Once | ORAL | Status: AC
  Administered 2024-03-05: 6 mg via ORAL
  Filled 2024-03-05: qty 2

## 2024-03-05 NOTE — Plan of Care (Signed)
  Problem: Clinical Measurements: Goal: Will remain free from infection Outcome: Progressing   Problem: Elimination: Goal: Will not experience complications related to bowel motility Outcome: Progressing   Problem: Pain Managment: Goal: General experience of comfort will improve and/or be controlled Outcome: Progressing

## 2024-03-05 NOTE — Plan of Care (Signed)
   Problem: Education: Goal: Knowledge of General Education information will improve Description: Including pain rating scale, medication(s)/side effects and non-pharmacologic comfort measures Outcome: Progressing   Problem: Clinical Measurements: Goal: Diagnostic test results will improve Outcome: Progressing   Problem: Activity: Goal: Risk for activity intolerance will decrease Outcome: Progressing   Problem: Nutrition: Goal: Adequate nutrition will be maintained Outcome: Progressing   Problem: Coping: Goal: Level of anxiety will decrease Outcome: Progressing

## 2024-03-05 NOTE — H&P (View-Only) (Signed)
 [facility] Consult  Reason for Consult: Rectal mass with bleeding  Referring Physician: Dr. Cinderella MD, Dr. Shaaron MD GI   Chief Complaint   Hematochezia     HPI: Paige Rhodes is a 89 y.o. female with a newly found rectal mass that has been bleeding. This was identified on colonoscopy done yesterday. She also had some polyps removed. The patient is on 4L O2 at baseline and has a history of HTN, HLD, T2DM, GERD, prior breast cancer and lumpectomy, CKD.  She was hemoccult +.  The patient's daughter reports that she has had a hemorrhoid for years that she has been putting ointment on at times. The patient can feel the lesion per report.  I discussed the case with Dr. Cinderella and Dr. Shaaron, and given her age and discomfort, bleeding, we discussed excision of the lesion. I have called her daughter Paige Rhodes and discussed the lesion and options. We plan to talk further tomorrow with entire family.   Past Medical History:  Diagnosis Date   Breast cancer (HCC)    Chronic renal disease, stage 2, mildly decreased glomerular filtration rate between 60-89 mL/min/1.73 square meter 10/31/2014   COPD (chronic obstructive pulmonary disease) (HCC)    Diabetes mellitus    GERD (gastroesophageal reflux disease)    occasional   Glaucoma    Gout attack june 2013   left foot   Hard of hearing    Hyperlipidemia    Hypertension    Hypothyroidism    Infiltrating ductal carcinoma of left breast 03/05/2011   On home oxygen  therapy    2 LPM   Osteoporosis    Port catheter in place 01/15/2012   SOB (shortness of breath)    Thyroid  disease     Past Surgical History:  Procedure Laterality Date   ANKLE SURGERY  1998   APPENDECTOMY  1955   BREAST LUMPECTOMY  2011   left   CATARACT EXTRACTION W/PHACO  09/03/2012   Procedure: CATARACT EXTRACTION PHACO AND INTRAOCULAR LENS PLACEMENT (IOC);  Surgeon: Cherene Mania, MD;  Location: [facility];  Service: Ophthalmology;  Laterality: Right;  CDE: 15.26    CATARACT EXTRACTION W/PHACO  09/28/2012   Procedure: CATARACT EXTRACTION PHACO AND INTRAOCULAR LENS PLACEMENT (IOC);  Surgeon: Cherene Mania, MD;  Location: [facility];  Service: Ophthalmology;  Laterality: Left;  CDE:  21.60   CHOLECYSTECTOMY  1990   INGUINAL HERNIA REPAIR Left 11/14/2022   Procedure: INCARCERATED INGUINAL HERNIA REPAIR WITH MESH;  Surgeon: Evonnie Dorothyann LABOR, DO;  Location: [facility];  Service: General;  Laterality: Left;   OPEN REDUCTION INTERNAL FIXATION (ORIF) DISTAL RADIAL FRACTURE Left 07/01/2019   Procedure: OPEN REDUCTION INTERNAL FIXATION (ORIF) LEFT DISTAL RADIAL FRACTURE;  Surgeon: Margrette Taft BRAVO, MD;  Location: [facility];  Service: Orthopedics;  Laterality: Left;   PORT-A-CATH REMOVAL N/A 04/26/2015   Procedure: REMOVAL PORT-A-CATH;  Surgeon: Oneil Budge, MD;  Location: [facility];  Service: General;  Laterality: N/A;   PORTACATH PLACEMENT  05/23/10   THYROID  SURGERY  1994   TONSILLECTOMY     TOTAL HIP ARTHROPLASTY  1996    Family History  Problem Relation Age of Onset   Kidney disease Mother    Heart disease Father    Hypertension Brother    Cancer Sister        breast and lung    Social History   Tobacco Use   Smoking status: Former    Current packs/day: 0.00    Average packs/day: 3.0 packs/day for  30.0 years (90.0 ttl pk-yrs)    Types: Cigarettes    Start date: 09/01/1961    Quit date: 09/02/1991    Years since quitting: 32.5   Smokeless tobacco: Never  Vaping Use   Vaping status: Never Used  Substance Use Topics   Alcohol use: No   Drug use: No    Medications: I have reviewed the patient's current medications. Prior to Admission:  Medications Prior to Admission  Medication Sig Dispense Refill Last Dose/Taking   acetaminophen  (TYLENOL ) 500 MG tablet Take 2 tablets (1,000 mg total) by mouth every 6 (six) hours as needed for mild pain, moderate pain, fever or headache. 30 tablet 0 Past Week   albuterol  (VENTOLIN  HFA) 108 (90 Base) MCG/ACT inhaler  Inhale 2 puffs into the lungs every 6 (six) hours as needed for wheezing or shortness of breath.   Unknown   amLODipine  (NORVASC ) 5 MG tablet Take 5 mg by mouth daily.    03/02/2024 Morning   Ascorbic Acid (VITAMIN C PO) Take 1 tablet by mouth daily.   03/02/2024 Morning   aspirin  EC 81 MG tablet Take 81 mg by mouth at bedtime.   03/01/2024 at  9:30 PM   atorvastatin  (LIPITOR) 10 MG tablet Take 10 mg by mouth daily.   03/01/2024 Bedtime   Calcium -Magnesium -Vitamin D  (CALCIUM  500 PO) Take 1 tablet by mouth every other day.   03/01/2024 Morning   carvedilol  (COREG ) 25 MG tablet Take 25 mg by mouth 2 (two) times daily with a meal.     03/04/2024   estradiol  (ESTRACE ) 0.1 MG/GM vaginal cream INSERT 1/2 INCH IN APPLICATOR VAGINALLY TWICE WEEKLY. (Patient taking differently: Place 1 Applicatorful vaginally 2 (two) times a week. INSERT 1/2 INCH IN APPLICATOR VAGINALLY TWICE WEEKLY.) 42.5 g 3 Past Week   furosemide  (LASIX ) 20 MG tablet Take 20 mg by mouth daily as needed for fluid.   Past Month   GOODSENSE CLEARLAX 17 GM/SCOOP powder Take 17 g by mouth daily as needed for mild constipation.   Past Week   hydrocortisone (ANUSOL-HC) 2.5 % rectal cream Apply 1 Application topically 2 (two) times daily.   03/01/2024 Noon   ipratropium-albuterol  (DUONEB) 0.5-2.5 (3) MG/3ML SOLN Take 3 mLs by nebulization every 4 (four) hours as needed (wheezing, shortness of breath).   03/04/2024   ketoconazole (NIZORAL) 2 % cream Apply 1 Application topically 2 (two) times daily.   Unknown   lactobacillus acidophilus (BACID) TABS tablet Take 1 tablet by mouth daily.   03/02/2024 Morning   levothyroxine  (SYNTHROID , LEVOTHROID) 125 MCG tablet Take 125 mcg by mouth daily.     03/04/2024   losartan  (COZAAR ) 50 MG tablet Take 50 mg by mouth daily.     03/04/2024   methenamine  (HIPREX ) 1 g tablet 1/2 tab twice daily (Patient taking differently: Take 500 mg by mouth 2 (two) times daily with a meal.) 60 tablet 11 03/02/2024 Morning   Multiple Vitamin  (MULTIVITAMIN) tablet Take 1 tablet by mouth daily.   03/02/2024 Morning   phenazopyridine (PYRIDIUM) 95 MG tablet Take 95 mg by mouth 2 (two) times daily.   03/02/2024 Morning   predniSONE  (DELTASONE ) 5 MG tablet Take 5 mg by mouth daily.   03/02/2024 Morning   Scheduled:  atorvastatin   10 mg Oral QHS   carvedilol   25 mg Oral BID WC   insulin  aspart  0-5 Units Subcutaneous QHS   insulin  aspart  0-9 Units Subcutaneous TID WC   ipratropium-albuterol   3 mL Nebulization TID  levothyroxine   125 mcg Oral Q0600   losartan   50 mg Oral Daily   Continuous: EMW:pemjumneplf-$MzfnczAzqnmzIZPI_WKzsPdfozIPfDhiOQqsdmaVuwwxLflcz$$MzfnczAzqnmzIZPI_WKzsPdfozIPfDhiOQqsdmaVuwwxLflcz$   Allergies  Allergen Reactions   Penicillins Anaphylaxis    Edema of throat   Hctz [Hydrochlorothiazide] Other (See Comments)    Gout    Lotrel [Amlodipine  Besy-Benazepril Hcl] Other (See Comments)    Facial muscles draw and break out in whilps   Morphine  And Codeine  Itching     ROS:  A comprehensive review of systems was negative except for: Respiratory: positive for O2 use, COPD Gastrointestinal: positive for BRBPR and mass  Blood pressure (!) 148/53, pulse 60, temperature 98.1 F (36.7 C), temperature source Oral, resp. rate 18, height 5' 2 (1.575 m), weight 79.9 kg, SpO2 97%. Physical Exam Vitals reviewed.  HENT:     Head: Normocephalic.     Nose: Nose normal.     Mouth/Throat:     Mouth: Mucous membranes are moist.  Eyes:     Extraocular Movements: Extraocular movements intact.  Cardiovascular:     Rate and Rhythm: Normal rate.  Pulmonary:     Effort: Pulmonary effort is normal.  Abdominal:     General: There is no distension.     Palpations: Abdomen is soft.     Tenderness: There is no abdominal tenderness.  Skin:    General: Skin is warm.  Neurological:     General: No focal deficit present.     Mental Status: She is alert.  Psychiatric:        Mood and Affect: Mood normal.        Behavior: Behavior normal.   Deferred rectal exam today- patient in chair eating   Results: Results for  orders placed or performed during the hospital encounter of 03/02/24 (from the past 48 hours)  Glucose, capillary     Status: Abnormal   Collection Time: 03/03/24  4:37 PM  Result Value Ref Range   Glucose-Capillary 112 (H) 70 - 99 mg/dL    Comment: Glucose reference range applies only to samples taken after fasting for at least 8 hours.  Glucose, capillary     Status: Abnormal   Collection Time: 03/03/24  8:24 PM  Result Value Ref Range   Glucose-Capillary 100 (H) 70 - 99 mg/dL    Comment: Glucose reference range applies only to samples taken after fasting for at least 8 hours.  Hemoglobin and hematocrit, blood     Status: Abnormal   Collection Time: 03/03/24  9:25 PM  Result Value Ref Range   Hemoglobin 10.7 (L) 12.0 - 15.0 g/dL   HCT 66.6 (L) 63.9 - 53.9 %    Comment: Performed at [facility], [address], [place], [place] [address]  Glucose, capillary     Status: None   Collection Time: 03/04/24 12:15 AM  Result Value Ref Range   Glucose-Capillary 87 70 - 99 mg/dL    Comment: Glucose reference range applies only to samples taken after fasting for at least 8 hours.  Glucose, capillary     Status: None   Collection Time: 03/04/24  4:15 AM  Result Value Ref Range   Glucose-Capillary 83 70 - 99 mg/dL    Comment: Glucose reference range applies only to samples taken after fasting for at least 8 hours.  Hemoglobin and hematocrit, blood     Status: Abnormal   Collection Time: 03/04/24  4:18 AM  Result Value Ref Range   Hemoglobin 10.0 (L) 12.0 - 15.0 g/dL   HCT 68.0 (L) 63.9 - 53.9 %  Comment: Performed at [facility], [address] [place], [place] [address]  Renal function panel     Status: Abnormal   Collection Time: 03/04/24  4:18 AM  Result Value Ref Range   Sodium 140 135 - 145 mmol/L   Potassium 3.8 3.5 - 5.1 mmol/L   Chloride 94 (L) 98 - 111 mmol/L   CO2 36 (H) 22 - 32 mmol/L   Glucose, Bld 78 70 - 99 mg/dL    Comment: Glucose reference range applies only to  samples taken after fasting for at least 8 hours.   BUN 24 (H) 8 - 23 mg/dL   Creatinine, Ser 8.96 (H) 0.44 - 1.00 mg/dL   Calcium  8.9 8.9 - 10.3 mg/dL   Phosphorus 4.3 2.5 - 4.6 mg/dL   Albumin 3.4 (L) 3.5 - 5.0 g/dL   GFR, Estimated 52 (L) >60 mL/min    Comment: (NOTE) Calculated using the CKD-EPI Creatinine Equation (2021)    Anion gap 10 5 - 15    Comment: Performed at [facility] [address] [place], [place] [address]  Glucose, capillary     Status: None   Collection Time: 03/04/24  7:05 AM  Result Value Ref Range   Glucose-Capillary 92 70 - 99 mg/dL    Comment: Glucose reference range applies only to samples taken after fasting for at least 8 hours.  Glucose, capillary     Status: None   Collection Time: 03/04/24 11:22 AM  Result Value Ref Range   Glucose-Capillary 82 70 - 99 mg/dL    Comment: Glucose reference range applies only to samples taken after fasting for at least 8 hours.  Hemoglobin and hematocrit, blood     Status: Abnormal   Collection Time: 03/04/24 12:56 PM  Result Value Ref Range   Hemoglobin 10.2 (L) 12.0 - 15.0 g/dL   HCT 67.7 (L) 63.9 - 53.9 %    Comment: Performed at [facility], [address], [place], [place] [address]  Glucose, capillary     Status: None   Collection Time: 03/04/24  1:19 PM  Result Value Ref Range   Glucose-Capillary 84 70 - 99 mg/dL    Comment: Glucose reference range applies only to samples taken after fasting for at least 8 hours.  Glucose, capillary     Status: None   Collection Time: 03/04/24  3:31 PM  Result Value Ref Range   Glucose-Capillary 72 70 - 99 mg/dL    Comment: Glucose reference range applies only to samples taken after fasting for at least 8 hours.  Glucose, capillary     Status: Abnormal   Collection Time: 03/04/24  4:51 PM  Result Value Ref Range   Glucose-Capillary 144 (H) 70 - 99 mg/dL    Comment: Glucose reference range applies only to samples taken after fasting for at least 8 hours.  Glucose,  capillary     Status: Abnormal   Collection Time: 03/04/24  7:42 PM  Result Value Ref Range   Glucose-Capillary 256 (H) 70 - 99 mg/dL    Comment: Glucose reference range applies only to samples taken after fasting for at least 8 hours.  Glucose, capillary     Status: None   Collection Time: 03/05/24 12:08 AM  Result Value Ref Range   Glucose-Capillary 96 70 - 99 mg/dL    Comment: Glucose reference range applies only to samples taken after fasting for at least 8 hours.  Renal function panel     Status: Abnormal   Collection Time:  03/05/24  4:18 AM  Result Value Ref Range   Sodium 139 135 - 145 mmol/L   Potassium 4.2 3.5 - 5.1 mmol/L   Chloride 95 (L) 98 - 111 mmol/L   CO2 37 (H) 22 - 32 mmol/L   Glucose, Bld 72 70 - 99 mg/dL    Comment: Glucose reference range applies only to samples taken after fasting for at least 8 hours.   BUN 31 (H) 8 - 23 mg/dL   Creatinine, Ser 8.84 (H) 0.44 - 1.00 mg/dL   Calcium  8.3 (L) 8.9 - 10.3 mg/dL   Phosphorus 4.2 2.5 - 4.6 mg/dL   Albumin 3.1 (L) 3.5 - 5.0 g/dL   GFR, Estimated 45 (L) >60 mL/min    Comment: (NOTE) Calculated using the CKD-EPI Creatinine Equation (2021)    Anion gap 7 5 - 15    Comment: Performed at [facility], [address] [place], [place] [address]  CBC     Status: Abnormal   Collection Time: 03/05/24  4:18 AM  Result Value Ref Range   WBC 7.2 4.0 - 10.5 K/uL   RBC 3.05 (L) 3.87 - 5.11 MIL/uL   Hemoglobin 9.2 (L) 12.0 - 15.0 g/dL   HCT 69.7 (L) 63.9 - 53.9 %   MCV 99.0 80.0 - 100.0 fL   MCH 30.2 26.0 - 34.0 pg   MCHC 30.5 30.0 - 36.0 g/dL   RDW 86.2 88.4 - 84.4 %   Platelets 170 150 - 400 K/uL   nRBC 0.0 0.0 - 0.2 %    Comment: Performed at [facility], [address], [place], [place] [address]  Glucose, capillary     Status: None   Collection Time: 03/05/24  7:33 AM  Result Value Ref Range   Glucose-Capillary 99 70 - 99 mg/dL    Comment: Glucose reference range applies only to samples taken after fasting for at  least 8 hours.  Glucose, capillary     Status: Abnormal   Collection Time: 03/05/24 11:05 AM  Result Value Ref Range   Glucose-Capillary 181 (H) 70 - 99 mg/dL    Comment: Glucose reference range applies only to samples taken after fasting for at least 8 hours.  Glucose, capillary     Status: Abnormal   Collection Time: 03/05/24  4:11 PM  Result Value Ref Range   Glucose-Capillary 136 (H) 70 - 99 mg/dL    Comment: Glucose reference range applies only to samples taken after fasting for at least 8 hours.   Personally reviewed CT 03/2024- no obvious lesions noted, diverticulosis   Assessment & Plan:  Paige Rhodes is a 89 y.o. female with a rectal mass that could be malignant. She is 9 with multiple medical issues and COPD on 4L at baseline. I have discussed with the patient and Melissa and the team option of excision to confirm diagnosis and improve the bleeding and discomfort. Discussed that give her age and co-morbidities, I do not think  large oncologic procedure would be appropriate or offered by [facility].   Will discuss further with the patient and family tomorrow.     Manuelita JAYSON Pander 03/05/2024, 4:31 PM

## 2024-03-05 NOTE — Consult Note (Signed)
 [facility] Consult  Reason for Consult: Rectal mass with bleeding  Referring Physician: Dr. Cinderella MD, Dr. Shaaron MD GI   Chief Complaint   Hematochezia     HPI: Paige Rhodes is a 89 y.o. female with a newly found rectal mass that has been bleeding. This was identified on colonoscopy done yesterday. She also had some polyps removed. The patient is on 4L O2 at baseline and has a history of HTN, HLD, T2DM, GERD, prior breast cancer and lumpectomy, CKD.  She was hemoccult +.  The patient's daughter reports that she has had a hemorrhoid for years that she has been putting ointment on at times. The patient can feel the lesion per report.  I discussed the case with Dr. Cinderella and Dr. Shaaron, and given her age and discomfort, bleeding, we discussed excision of the lesion. I have called her daughter Paige Rhodes and discussed the lesion and options. We plan to talk further tomorrow with entire family.   Past Medical History:  Diagnosis Date   Breast cancer (HCC)    Chronic renal disease, stage 2, mildly decreased glomerular filtration rate between 60-89 mL/min/1.73 square meter 10/31/2014   COPD (chronic obstructive pulmonary disease) (HCC)    Diabetes mellitus    GERD (gastroesophageal reflux disease)    occasional   Glaucoma    Gout attack june 2013   left foot   Hard of hearing    Hyperlipidemia    Hypertension    Hypothyroidism    Infiltrating ductal carcinoma of left breast 03/05/2011   On home oxygen  therapy    2 LPM   Osteoporosis    Port catheter in place 01/15/2012   SOB (shortness of breath)    Thyroid  disease     Past Surgical History:  Procedure Laterality Date   ANKLE SURGERY  1998   APPENDECTOMY  1955   BREAST LUMPECTOMY  2011   left   CATARACT EXTRACTION W/PHACO  09/03/2012   Procedure: CATARACT EXTRACTION PHACO AND INTRAOCULAR LENS PLACEMENT (IOC);  Surgeon: Cherene Mania, MD;  Location: [facility];  Service: Ophthalmology;  Laterality: Right;  CDE: 15.26    CATARACT EXTRACTION W/PHACO  09/28/2012   Procedure: CATARACT EXTRACTION PHACO AND INTRAOCULAR LENS PLACEMENT (IOC);  Surgeon: Cherene Mania, MD;  Location: [facility];  Service: Ophthalmology;  Laterality: Left;  CDE:  21.60   CHOLECYSTECTOMY  1990   INGUINAL HERNIA REPAIR Left 11/14/2022   Procedure: INCARCERATED INGUINAL HERNIA REPAIR WITH MESH;  Surgeon: Evonnie Dorothyann LABOR, DO;  Location: [facility];  Service: General;  Laterality: Left;   OPEN REDUCTION INTERNAL FIXATION (ORIF) DISTAL RADIAL FRACTURE Left 07/01/2019   Procedure: OPEN REDUCTION INTERNAL FIXATION (ORIF) LEFT DISTAL RADIAL FRACTURE;  Surgeon: Margrette Taft BRAVO, MD;  Location: [facility];  Service: Orthopedics;  Laterality: Left;   PORT-A-CATH REMOVAL N/A 04/26/2015   Procedure: REMOVAL PORT-A-CATH;  Surgeon: Oneil Budge, MD;  Location: [facility];  Service: General;  Laterality: N/A;   PORTACATH PLACEMENT  05/23/10   THYROID  SURGERY  1994   TONSILLECTOMY     TOTAL HIP ARTHROPLASTY  1996    Family History  Problem Relation Age of Onset   Kidney disease Mother    Heart disease Father    Hypertension Brother    Cancer Sister        breast and lung    Social History   Tobacco Use   Smoking status: Former    Current packs/day: 0.00    Average packs/day: 3.0 packs/day for  30.0 years (90.0 ttl pk-yrs)    Types: Cigarettes    Start date: 09/01/1961    Quit date: 09/02/1991    Years since quitting: 32.5   Smokeless tobacco: Never  Vaping Use   Vaping status: Never Used  Substance Use Topics   Alcohol use: No   Drug use: No    Medications: I have reviewed the patient's current medications. Prior to Admission:  Medications Prior to Admission  Medication Sig Dispense Refill Last Dose/Taking   acetaminophen  (TYLENOL ) 500 MG tablet Take 2 tablets (1,000 mg total) by mouth every 6 (six) hours as needed for mild pain, moderate pain, fever or headache. 30 tablet 0 Past Week   albuterol  (VENTOLIN  HFA) 108 (90 Base) MCG/ACT inhaler  Inhale 2 puffs into the lungs every 6 (six) hours as needed for wheezing or shortness of breath.   Unknown   amLODipine  (NORVASC ) 5 MG tablet Take 5 mg by mouth daily.    03/02/2024 Morning   Ascorbic Acid (VITAMIN C PO) Take 1 tablet by mouth daily.   03/02/2024 Morning   aspirin  EC 81 MG tablet Take 81 mg by mouth at bedtime.   03/01/2024 at  9:30 PM   atorvastatin  (LIPITOR) 10 MG tablet Take 10 mg by mouth daily.   03/01/2024 Bedtime   Calcium -Magnesium -Vitamin D  (CALCIUM  500 PO) Take 1 tablet by mouth every other day.   03/01/2024 Morning   carvedilol  (COREG ) 25 MG tablet Take 25 mg by mouth 2 (two) times daily with a meal.     03/04/2024   estradiol  (ESTRACE ) 0.1 MG/GM vaginal cream INSERT 1/2 INCH IN APPLICATOR VAGINALLY TWICE WEEKLY. (Patient taking differently: Place 1 Applicatorful vaginally 2 (two) times a week. INSERT 1/2 INCH IN APPLICATOR VAGINALLY TWICE WEEKLY.) 42.5 g 3 Past Week   furosemide  (LASIX ) 20 MG tablet Take 20 mg by mouth daily as needed for fluid.   Past Month   GOODSENSE CLEARLAX 17 GM/SCOOP powder Take 17 g by mouth daily as needed for mild constipation.   Past Week   hydrocortisone (ANUSOL-HC) 2.5 % rectal cream Apply 1 Application topically 2 (two) times daily.   03/01/2024 Noon   ipratropium-albuterol  (DUONEB) 0.5-2.5 (3) MG/3ML SOLN Take 3 mLs by nebulization every 4 (four) hours as needed (wheezing, shortness of breath).   03/04/2024   ketoconazole (NIZORAL) 2 % cream Apply 1 Application topically 2 (two) times daily.   Unknown   lactobacillus acidophilus (BACID) TABS tablet Take 1 tablet by mouth daily.   03/02/2024 Morning   levothyroxine  (SYNTHROID , LEVOTHROID) 125 MCG tablet Take 125 mcg by mouth daily.     03/04/2024   losartan  (COZAAR ) 50 MG tablet Take 50 mg by mouth daily.     03/04/2024   methenamine  (HIPREX ) 1 g tablet 1/2 tab twice daily (Patient taking differently: Take 500 mg by mouth 2 (two) times daily with a meal.) 60 tablet 11 03/02/2024 Morning   Multiple Vitamin  (MULTIVITAMIN) tablet Take 1 tablet by mouth daily.   03/02/2024 Morning   phenazopyridine (PYRIDIUM) 95 MG tablet Take 95 mg by mouth 2 (two) times daily.   03/02/2024 Morning   predniSONE  (DELTASONE ) 5 MG tablet Take 5 mg by mouth daily.   03/02/2024 Morning   Scheduled:  atorvastatin   10 mg Oral QHS   carvedilol   25 mg Oral BID WC   insulin  aspart  0-5 Units Subcutaneous QHS   insulin  aspart  0-9 Units Subcutaneous TID WC   ipratropium-albuterol   3 mL Nebulization TID  levothyroxine   125 mcg Oral Q0600   losartan   50 mg Oral Daily   Continuous: EMW:pemjumneplf-$MzfnczAzqnmzIZPI_WKzsPdfozIPfDhiOQqsdmaVuwwxLflcz$$MzfnczAzqnmzIZPI_WKzsPdfozIPfDhiOQqsdmaVuwwxLflcz$   Allergies  Allergen Reactions   Penicillins Anaphylaxis    Edema of throat   Hctz [Hydrochlorothiazide] Other (See Comments)    Gout    Lotrel [Amlodipine  Besy-Benazepril Hcl] Other (See Comments)    Facial muscles draw and break out in whilps   Morphine  And Codeine  Itching     ROS:  A comprehensive review of systems was negative except for: Respiratory: positive for O2 use, COPD Gastrointestinal: positive for BRBPR and mass  Blood pressure (!) 148/53, pulse 60, temperature 98.1 F (36.7 C), temperature source Oral, resp. rate 18, height 5' 2 (1.575 m), weight 79.9 kg, SpO2 97%. Physical Exam Vitals reviewed.  HENT:     Head: Normocephalic.     Nose: Nose normal.     Mouth/Throat:     Mouth: Mucous membranes are moist.  Eyes:     Extraocular Movements: Extraocular movements intact.  Cardiovascular:     Rate and Rhythm: Normal rate.  Pulmonary:     Effort: Pulmonary effort is normal.  Abdominal:     General: There is no distension.     Palpations: Abdomen is soft.     Tenderness: There is no abdominal tenderness.  Skin:    General: Skin is warm.  Neurological:     General: No focal deficit present.     Mental Status: She is alert.  Psychiatric:        Mood and Affect: Mood normal.        Behavior: Behavior normal.   Deferred rectal exam today- patient in chair eating   Results: Results for  orders placed or performed during the hospital encounter of 03/02/24 (from the past 48 hours)  Glucose, capillary     Status: Abnormal   Collection Time: 03/03/24  4:37 PM  Result Value Ref Range   Glucose-Capillary 112 (H) 70 - 99 mg/dL    Comment: Glucose reference range applies only to samples taken after fasting for at least 8 hours.  Glucose, capillary     Status: Abnormal   Collection Time: 03/03/24  8:24 PM  Result Value Ref Range   Glucose-Capillary 100 (H) 70 - 99 mg/dL    Comment: Glucose reference range applies only to samples taken after fasting for at least 8 hours.  Hemoglobin and hematocrit, blood     Status: Abnormal   Collection Time: 03/03/24  9:25 PM  Result Value Ref Range   Hemoglobin 10.7 (L) 12.0 - 15.0 g/dL   HCT 66.6 (L) 63.9 - 53.9 %    Comment: Performed at [facility], [address], [place], [place] [address]  Glucose, capillary     Status: None   Collection Time: 03/04/24 12:15 AM  Result Value Ref Range   Glucose-Capillary 87 70 - 99 mg/dL    Comment: Glucose reference range applies only to samples taken after fasting for at least 8 hours.  Glucose, capillary     Status: None   Collection Time: 03/04/24  4:15 AM  Result Value Ref Range   Glucose-Capillary 83 70 - 99 mg/dL    Comment: Glucose reference range applies only to samples taken after fasting for at least 8 hours.  Hemoglobin and hematocrit, blood     Status: Abnormal   Collection Time: 03/04/24  4:18 AM  Result Value Ref Range   Hemoglobin 10.0 (L) 12.0 - 15.0 g/dL   HCT 68.0 (L) 63.9 - 53.9 %  Comment: Performed at [facility], [address] [place], [place] [address]  Renal function panel     Status: Abnormal   Collection Time: 03/04/24  4:18 AM  Result Value Ref Range   Sodium 140 135 - 145 mmol/L   Potassium 3.8 3.5 - 5.1 mmol/L   Chloride 94 (L) 98 - 111 mmol/L   CO2 36 (H) 22 - 32 mmol/L   Glucose, Bld 78 70 - 99 mg/dL    Comment: Glucose reference range applies only to  samples taken after fasting for at least 8 hours.   BUN 24 (H) 8 - 23 mg/dL   Creatinine, Ser 8.96 (H) 0.44 - 1.00 mg/dL   Calcium  8.9 8.9 - 10.3 mg/dL   Phosphorus 4.3 2.5 - 4.6 mg/dL   Albumin 3.4 (L) 3.5 - 5.0 g/dL   GFR, Estimated 52 (L) >60 mL/min    Comment: (NOTE) Calculated using the CKD-EPI Creatinine Equation (2021)    Anion gap 10 5 - 15    Comment: Performed at [facility] [address] [place], [place] [address]  Glucose, capillary     Status: None   Collection Time: 03/04/24  7:05 AM  Result Value Ref Range   Glucose-Capillary 92 70 - 99 mg/dL    Comment: Glucose reference range applies only to samples taken after fasting for at least 8 hours.  Glucose, capillary     Status: None   Collection Time: 03/04/24 11:22 AM  Result Value Ref Range   Glucose-Capillary 82 70 - 99 mg/dL    Comment: Glucose reference range applies only to samples taken after fasting for at least 8 hours.  Hemoglobin and hematocrit, blood     Status: Abnormal   Collection Time: 03/04/24 12:56 PM  Result Value Ref Range   Hemoglobin 10.2 (L) 12.0 - 15.0 g/dL   HCT 67.7 (L) 63.9 - 53.9 %    Comment: Performed at [facility], [address], [place], [place] [address]  Glucose, capillary     Status: None   Collection Time: 03/04/24  1:19 PM  Result Value Ref Range   Glucose-Capillary 84 70 - 99 mg/dL    Comment: Glucose reference range applies only to samples taken after fasting for at least 8 hours.  Glucose, capillary     Status: None   Collection Time: 03/04/24  3:31 PM  Result Value Ref Range   Glucose-Capillary 72 70 - 99 mg/dL    Comment: Glucose reference range applies only to samples taken after fasting for at least 8 hours.  Glucose, capillary     Status: Abnormal   Collection Time: 03/04/24  4:51 PM  Result Value Ref Range   Glucose-Capillary 144 (H) 70 - 99 mg/dL    Comment: Glucose reference range applies only to samples taken after fasting for at least 8 hours.  Glucose,  capillary     Status: Abnormal   Collection Time: 03/04/24  7:42 PM  Result Value Ref Range   Glucose-Capillary 256 (H) 70 - 99 mg/dL    Comment: Glucose reference range applies only to samples taken after fasting for at least 8 hours.  Glucose, capillary     Status: None   Collection Time: 03/05/24 12:08 AM  Result Value Ref Range   Glucose-Capillary 96 70 - 99 mg/dL    Comment: Glucose reference range applies only to samples taken after fasting for at least 8 hours.  Renal function panel     Status: Abnormal   Collection Time:  03/05/24  4:18 AM  Result Value Ref Range   Sodium 139 135 - 145 mmol/L   Potassium 4.2 3.5 - 5.1 mmol/L   Chloride 95 (L) 98 - 111 mmol/L   CO2 37 (H) 22 - 32 mmol/L   Glucose, Bld 72 70 - 99 mg/dL    Comment: Glucose reference range applies only to samples taken after fasting for at least 8 hours.   BUN 31 (H) 8 - 23 mg/dL   Creatinine, Ser 8.84 (H) 0.44 - 1.00 mg/dL   Calcium  8.3 (L) 8.9 - 10.3 mg/dL   Phosphorus 4.2 2.5 - 4.6 mg/dL   Albumin 3.1 (L) 3.5 - 5.0 g/dL   GFR, Estimated 45 (L) >60 mL/min    Comment: (NOTE) Calculated using the CKD-EPI Creatinine Equation (2021)    Anion gap 7 5 - 15    Comment: Performed at [facility], [address] [place], [place] [address]  CBC     Status: Abnormal   Collection Time: 03/05/24  4:18 AM  Result Value Ref Range   WBC 7.2 4.0 - 10.5 K/uL   RBC 3.05 (L) 3.87 - 5.11 MIL/uL   Hemoglobin 9.2 (L) 12.0 - 15.0 g/dL   HCT 69.7 (L) 63.9 - 53.9 %   MCV 99.0 80.0 - 100.0 fL   MCH 30.2 26.0 - 34.0 pg   MCHC 30.5 30.0 - 36.0 g/dL   RDW 86.2 88.4 - 84.4 %   Platelets 170 150 - 400 K/uL   nRBC 0.0 0.0 - 0.2 %    Comment: Performed at [facility], [address], [place], [place] [address]  Glucose, capillary     Status: None   Collection Time: 03/05/24  7:33 AM  Result Value Ref Range   Glucose-Capillary 99 70 - 99 mg/dL    Comment: Glucose reference range applies only to samples taken after fasting for at  least 8 hours.  Glucose, capillary     Status: Abnormal   Collection Time: 03/05/24 11:05 AM  Result Value Ref Range   Glucose-Capillary 181 (H) 70 - 99 mg/dL    Comment: Glucose reference range applies only to samples taken after fasting for at least 8 hours.  Glucose, capillary     Status: Abnormal   Collection Time: 03/05/24  4:11 PM  Result Value Ref Range   Glucose-Capillary 136 (H) 70 - 99 mg/dL    Comment: Glucose reference range applies only to samples taken after fasting for at least 8 hours.   Personally reviewed CT 03/2024- no obvious lesions noted, diverticulosis   Assessment & Plan:  Paige Rhodes is a 89 y.o. female with a rectal mass that could be malignant. She is 9 with multiple medical issues and COPD on 4L at baseline. I have discussed with the patient and Paige Rhodes and the team option of excision to confirm diagnosis and improve the bleeding and discomfort. Discussed that give her age and co-morbidities, I do not think  large oncologic procedure would be appropriate or offered by [facility].   Will discuss further with the patient and family tomorrow.     Paige Rhodes 03/05/2024, 4:31 PM

## 2024-03-05 NOTE — TOC Progression Note (Addendum)
 Transition of Care [facility]) - Progression Note    Patient Details  Name: Paige Rhodes MRN: [id number] Date of Birth: 12-15-1931  [facility] CM/SW Contact  Lorraine LILLETTE Fenton, LCSW Phone Number: 03/05/2024, 10:09 AM  Clinical Narrative:     Pt from home,[facility] following. Consult in to determine if there is a need for further procedures.  During this visit.  TOC following.  Addendum: Pt family present- had questions regarding MOON- Pt is Inpatient not a concern- explained to pt and family.  Pt also expressed that she has home care 4 hours a day- then she is alone, can Hospice help with more care. CSW advised that is a good question for hospice after her procedure. Finally daughters asked about transportation home after procedure next week. CSW advised that Hospice or TOC will assist with this piece as needed.  TOC following.  Expected Discharge Plan: Home w Hospice Care Barriers to Discharge: Continued Medical Work up  Expected Discharge Plan and Services In-house Referral: NA Discharge Planning Services: NA Post Acute Care Choice: Resumption of Svcs/PTA Provider, Hospice Living arrangements for the past 2 months: Single Family Home                 DME Arranged: N/A DME Agency: NA                   Social Determinants of Health (SDOH) Interventions SDOH Screenings   Food Insecurity: No Food Insecurity (03/03/2024)  Housing: Low Risk  (03/03/2024)  Transportation Needs: No Transportation Needs (03/03/2024)  Utilities: Not At Risk (03/03/2024)  Social Connections: Unknown (03/03/2024)  Tobacco Use: Medium Risk (03/05/2024)    Readmission Risk Interventions    12/10/2021   10:08 AM  Readmission Risk Prevention Plan  Transportation Screening Complete  Home Care Screening Complete  Medication Review (RN CM) Complete

## 2024-03-05 NOTE — Progress Notes (Signed)
 [facility]                                                                              Paige Rhodes, is a 89 y.o. female, DOB - 14-Sep-1931, FMW:[id number] Admit date - 03/02/2024    Outpatient Primary MD for the patient is [name], MD  LOS - 2  days  Chief Complaint  Patient presents with   Hematochezia       Brief summary   Patient is a 89 year old female with HTN, HLP, DM type II, COPD with chronic respiratory failure on 4 L O2 via [place] at baseline, hypothyroidism, GERD, breast CA status postlumpectomy, CKD stage IIIa presented to ED with rectal bleeding.  Patient reported 3 episodes of bowel movement with bright red blood in the commode, no abdominal pain, fevers or chills. FOBT positive, CT abdomen pelvis showed no acute intra-abdominal or pelvic pathology, sigmoid diverticulosis no bowel obstruction.  Assessment & Plan    Rectal bleed BRBPR - Appreciate assistance and recommendation by GI service - Colonoscopy demonstrating the presence of diverticulosis, polyps and also lesion near anal area with concern for malignancy; biopsy taken and recommendations for transanal resection provided. - General Surgery has been consulted for assistance in further management. - No overt bleeding reported - Continue to follow hemoglobin trend. - Patient denies abdominal pain, nausea and vomiting    Acquired hypothyroidism - Continue Synthroid .    Mixed hyperlipidemia -Continue Lipitor - Heart healthy/low-fat diet discussed with patient.    Essential hypertension - Stable labetalol - Continue to follow vital signs.    COPD (chronic obstructive pulmonary disease) (HCC),  Chronic respiratory failure with hypoxia (HCC) - Currently no acute wheezing - Continue chronic oxygen  supplementation (4 L at baseline) -Continue as needed bronchodilator management.    Type 2 diabetes mellitus with hyperglycemia (HCC) Hemoglobin A1c 5.4 CBG (last 3)  Recent Labs     03/05/24 0733 03/05/24 1105 03/05/24 1611  GLUCAP 99 181* 136*  -Continue sliding scale insulin , sensitive -Continue to follow CBG fluctuation and adjust hypoglycemic regimen as needed.   CKD stage 3A - Stable and at baseline - Continue to follow renal function trend.  Obesity class 1  Estimated body mass index is 32.22 kg/m as calculated from the following:   Height as of this encounter: 5' 2 (1.575 m).   Weight as of this encounter: 79.9 kg.  Code Status: Full CODE STATUS DVT Prophylaxis:  SCDs Start: 03/03/24 0105   Level of Care: Level of care: Med-Surg Family Communication: Updated patient Disposition Plan:      Remains inpatient appropriate:   Hopefully DC home once medically stable.   Procedures:    Consultants:   Gastroenterology  Antimicrobials:   Anti-infectives (From admission, onward)    None       Medications  atorvastatin   10 mg Oral QHS   carvedilol   25 mg Oral BID WC   insulin  aspart  0-5 Units Subcutaneous QHS   insulin  aspart  0-9 Units Subcutaneous TID WC   ipratropium-albuterol   3 mL Nebulization TID   levothyroxine   125 mcg Oral Q0600   losartan   50 mg Oral  Daily    Subjective:   Paige Rhodes reported no chest pain, no nausea, no vomiting and no upper bleeding.  Objective:   Vitals:   03/05/24 0511 03/05/24 0539 03/05/24 1021 03/05/24 1502  BP: (!) 143/72  (!) 151/63 (!) 148/53  Pulse: 64  67 60  Resp: 19   18  Temp: 98.2 F (36.8 C)   98.1 F (36.7 C)  TempSrc:    Oral  SpO2: 95% 98%  97%  Weight:      Height:        Intake/Output Summary (Last 24 hours) at 03/05/2024 1631 Last data filed at 03/05/2024 1358 Gross per 24 hour  Intake 960 ml  Output 500 ml  Net 460 ml     Wt Readings from Last 3 Encounters:  03/04/24 79.9 kg  05/12/23 73.7 kg  11/26/22 73.9 kg   Physical Exam General exam: Alert, awake, oriented x 3; in no acute distress.  Denies chest pain, nausea, vomiting or abdominal pain. Respiratory system:  Saturation: Supplementation (4 L at baseline); no using accessory muscles. Cardiovascular system:RRR. No murmurs, rubs, gallops. Gastrointestinal system: Abdomen is nondistended, soft and nontender.  Positive bowel sounds appreciated on exam. Central nervous system:  No focal neurological deficits. Extremities: No cyanosis or clubbing. Skin: No petechiae. Psychiatry: Judgement and insight appear normal. Mood & affect appropriate.     Data Reviewed:  I have personally reviewed following labs    CBC Lab Results  Component Value Date   WBC 7.2 03/05/2024   RBC 3.05 (L) 03/05/2024   HGB 9.2 (L) 03/05/2024   HCT 30.2 (L) 03/05/2024   MCV 99.0 03/05/2024   MCH 30.2 03/05/2024   PLT 170 03/05/2024   MCHC 30.5 03/05/2024   RDW 13.7 03/05/2024   LYMPHSABS 1.1 03/02/2024   MONOABS 0.4 03/02/2024   EOSABS 0.1 03/02/2024   BASOSABS 0.0 03/02/2024     Last metabolic panel Lab Results  Component Value Date   NA 139 03/05/2024   K 4.2 03/05/2024   CL 95 (L) 03/05/2024   CO2 37 (H) 03/05/2024   BUN 31 (H) 03/05/2024   CREATININE 1.15 (H) 03/05/2024   GLUCOSE 72 03/05/2024   GFRNONAA 45 (L) 03/05/2024   GFRAA >60 08/31/2019   CALCIUM  8.3 (L) 03/05/2024   PHOS 4.2 03/05/2024   PROT 6.6 03/03/2024   ALBUMIN 3.1 (L) 03/05/2024   LABGLOB 3.5 08/11/2023   AGRATIO 1.1 08/11/2023   BILITOT 0.5 03/03/2024   ALKPHOS 48 03/03/2024   AST 15 03/03/2024   ALT 14 03/03/2024   ANIONGAP 7 03/05/2024    CBG (last 3)  Recent Labs    03/05/24 0733 03/05/24 1105 03/05/24 1611  GLUCAP 99 181* 136*     Coagulation Profile: No results for input(s): INR, PROTIME in the last 168 hours.   Radiology Studies: I have personally reviewed the imaging studies  No results found.   Eric Nunnery M.D. [facility] 03/05/2024, 4:31 PM  Available via Epic secure chat 7am-7pm After 7 pm, please refer to night coverage provider listed on amion.

## 2024-03-05 NOTE — Progress Notes (Signed)
 Patient doing well this morning after colonoscopy yesterday.  Tolerating a regular diet.  Reviewed colonoscopy findings with Dr. Cinderella late yesterday.  Endoscopic photographs depict a 3 cm relatively well-circumscribed lesion just inside the anal verge.  Felt to be too large to remove endoscopically. More proximal colon polyps removed as well.  Regardless of pathology, I believe this lady would be well served by surgical transanal resection.  This would likely alleviate her symptoms for the foreseeable future.

## 2024-03-06 DIAGNOSIS — K6289 Other specified diseases of anus and rectum: Secondary | ICD-10-CM | POA: Diagnosis not present

## 2024-03-06 DIAGNOSIS — K625 Hemorrhage of anus and rectum: Secondary | ICD-10-CM | POA: Diagnosis not present

## 2024-03-06 LAB — GLUCOSE, CAPILLARY
Glucose-Capillary: 85 mg/dL (ref 70–99)
Glucose-Capillary: 244 mg/dL — ABNORMAL HIGH (ref 70–99)
Glucose-Capillary: 91 mg/dL (ref 70–99)
Glucose-Capillary: 187 mg/dL — ABNORMAL HIGH (ref 70–99)

## 2024-03-06 LAB — CBC
HCT: 30.3 % — ABNORMAL LOW (ref 36.0–46.0)
Hemoglobin: 9.6 g/dL — ABNORMAL LOW (ref 12.0–15.0)
MCH: 31.4 pg (ref 26.0–34.0)
MCHC: 31.7 g/dL (ref 30.0–36.0)
MCV: 99 fL (ref 80.0–100.0)
Platelets: 177 K/uL (ref 150–400)
RBC: 3.06 MIL/uL — ABNORMAL LOW (ref 3.87–5.11)
RDW: 13.7 % (ref 11.5–15.5)
WBC: 8.7 K/uL (ref 4.0–10.5)
nRBC: 0 % (ref 0.0–0.2)

## 2024-03-06 LAB — RENAL FUNCTION PANEL
Albumin: 3.2 g/dL — ABNORMAL LOW (ref 3.5–5.0)
Anion gap: 6 (ref 5–15)
BUN: 34 mg/dL — ABNORMAL HIGH (ref 8–23)
CO2: 36 mmol/L — ABNORMAL HIGH (ref 22–32)
Calcium: 8 mg/dL — ABNORMAL LOW (ref 8.9–10.3)
Chloride: 95 mmol/L — ABNORMAL LOW (ref 98–111)
Creatinine, Ser: 1.19 mg/dL — ABNORMAL HIGH (ref 0.44–1.00)
GFR, Estimated: 43 mL/min — ABNORMAL LOW (ref >60)
Glucose, Bld: 91 mg/dL (ref 70–99)
Phosphorus: 3.6 mg/dL (ref 2.5–4.6)
Potassium: 3.8 mmol/L (ref 3.5–5.1)
Sodium: 137 mmol/L (ref 135–145)

## 2024-03-06 MED ORDER — TRAZODONE HCL 50 MG PO TABS
25.0000 mg | ORAL_TABLET | Freq: Once | ORAL | Status: AC
  Administered 2024-03-06: 25 mg via ORAL
  Filled 2024-03-06: qty 1

## 2024-03-06 MED ORDER — OXYCODONE HCL 5 MG PO TABS
5.0000 mg | ORAL_TABLET | Freq: Once | ORAL | Status: AC | PRN
  Administered 2024-03-06: 5 mg via ORAL
  Filled 2024-03-06: qty 1

## 2024-03-06 MED ORDER — FEBUXOSTAT 40 MG PO TABS
40.0000 mg | ORAL_TABLET | Freq: Every day | ORAL | Status: DC
  Administered 2024-03-06 – 2024-03-11 (×5): 40 mg via ORAL
  Filled 2024-03-06 (×6): qty 1

## 2024-03-06 MED ORDER — PREDNISONE 20 MG PO TABS
20.0000 mg | ORAL_TABLET | Freq: Every day | ORAL | Status: DC
  Administered 2024-03-06 – 2024-03-11 (×5): 20 mg via ORAL
  Filled 2024-03-06 (×6): qty 1

## 2024-03-06 MED ORDER — OXYCODONE HCL 5 MG PO TABS
5.0000 mg | ORAL_TABLET | Freq: Once | ORAL | Status: AC
  Administered 2024-03-06: 5 mg via ORAL
  Filled 2024-03-06: qty 1

## 2024-03-06 NOTE — TOC Progression Note (Signed)
 Transition of Care [facility]) - Progression Note   Patient Details  Name: Paige Rhodes MRN: [id number] Date of Birth: 06-11-1932  Transition of Care [facility]) CM/SW Contact  [name], RN Phone Number: 03/06/2024, 3:18 PM  Clinical Narrative:    Pt has an aide for 4 hours daily provided by [facility] and is active c/[facility]. She has the following DME: O2 and nebulizer [facility]), walker, wc, bsc, shower bench.  Surgery scheduled for Monday, March 08, 2024.   Expected Discharge Plan: Home w Hospice Care Barriers to Discharge: Continued Medical Work up  Expected Discharge Plan and Services In-house Referral: NA Discharge Planning Services: NA Post Acute Care Choice: Resumption of Svcs/PTA Provider, Hospice Living arrangements for the past 2 months: Single Family Home                 DME Arranged: N/A DME Agency: NA   Social Determinants of Health (SDOH) Interventions SDOH Screenings   Food Insecurity: No Food Insecurity (03/03/2024)  Housing: Low Risk  (03/03/2024)  Transportation Needs: No Transportation Needs (03/03/2024)  Utilities: Not At Risk (03/03/2024)  Social Connections: Unknown (03/03/2024)  Tobacco Use: Medium Risk (03/05/2024)   Readmission Risk Interventions

## 2024-03-06 NOTE — Progress Notes (Signed)
 [facility]  Extensive conversation with patient and her family. Discussed the lesion. I can feel it by DRE and was able to pull it out of the anal canal. It is mobile and soft.  Patient reports Dr. Sheryle being worried about hemorrhoid in the past. Reports lesion for a long time.   BP (!) 143/57 (BP Location: Right Arm)   Pulse (!) 59   Temp 98.1 F (36.7 C) (Oral)   Resp 17   Ht 5' 2 (1.575 m)   Wt 79.9 kg   SpO2 99%   BMI 32.22 kg/m    Patient with rectal lesion. Discussed option of excision with sedation  transanal. Discussed risk of bleeding, infection, finding cancer, injury to sphincter. Discussed that if this is cancer it is not surgery to cure a cancer we are merely resecting it for symptom control and diagnosis. Discussed the other biopsies and that they are not back but potentially could non diagnostic which would require excision in the end. Fact that she is likely not candidate for large surgery. Discussed this would hopefully help her discomfort and bleeding. Discussed that we could hopefully just do some sedation.   OR Monday  Greater than 50% of the 50 minute visit was spent in counseling/ coordination of care regarding her options for the mass and planning for surgery.   [name], MD [facility] [address] [place], [place] [address] [phone] (office)

## 2024-03-06 NOTE — Progress Notes (Signed)
 [facility]                                                                              Virgen Rhodes, is a 89 y.o. female, DOB - 1932-01-09, FMW:[id number] Admit date - 03/02/2024    Outpatient Primary MD for the patient is [name], MD  LOS - 3  days  Chief Complaint  Patient presents with   Hematochezia       Brief summary   Patient is a 89 year old female with HTN, HLP, DM type II, COPD with chronic respiratory failure on 4 L O2 via [place] at baseline, hypothyroidism, GERD, breast CA status postlumpectomy, CKD stage IIIa presented to ED with rectal bleeding.  Patient reported 3 episodes of bowel movement with bright red blood in the commode, no abdominal pain, fevers or chills. FOBT positive, CT abdomen pelvis showed no acute intra-abdominal or pelvic pathology, sigmoid diverticulosis no bowel obstruction.  Assessment & Plan    Rectal bleed BRBPR - Appreciate assistance and recommendation by GI service - Colonoscopy demonstrating the presence of diverticulosis, polyps and also lesion near anal area with concern for malignancy; biopsy taken and recommendations for transanal resection provided. - Appreciate assistance and recommendation by general surgery; plan is for surgical removal of anal lesion on 03/08/2024. - No overt bleeding reported - Continue to follow hemoglobin trend. - Patient denies abdominal pain, nausea and vomiting    Acquired hypothyroidism - Continue Synthroid .    Mixed hyperlipidemia -Continue Lipitor - Heart healthy/low-fat diet discussed with patient.    Essential hypertension - Stable labetalol - Continue to follow vital signs.    COPD (chronic obstructive pulmonary disease) (HCC),  Chronic respiratory failure with hypoxia (HCC) - Currently no acute wheezing - Continue chronic oxygen  supplementation (4 L at baseline) -Continue as needed bronchodilator management.    Type 2 diabetes mellitus with hyperglycemia (HCC) Hemoglobin A1c  5.4 CBG (last 3)  Recent Labs    03/06/24 0732 03/06/24 1106 03/06/24 1616  GLUCAP 85 244* 91  -Continue sliding scale insulin , sensitive -Continue to follow CBG fluctuation and adjust hypoglycemic regimen as needed.   CKD stage 3A - Stable and at baseline - Continue to follow renal function trend.  Obesity class 1  Estimated body mass index is 32.22 kg/m as calculated from the following:   Height as of this encounter: 5' 2 (1.575 m).   Weight as of this encounter: 79.9 kg.  Acute Gout attack - Uloric  and prednisone  will be provided - Follow clinical response.  Code Status: Full CODE STATUS DVT Prophylaxis:  SCDs Start: 03/03/24 0105   Level of Care: Level of care: Med-Surg Family Communication: Updated patient Disposition Plan:      Remains inpatient appropriate:   Hopefully DC home once medically stable.   Procedures:    Consultants:   Gastroenterology  Antimicrobials:   Anti-infectives (From admission, onward)    None       Medications  atorvastatin   10 mg Oral QHS   carvedilol   25 mg Oral BID WC   insulin  aspart  0-5 Units Subcutaneous QHS   insulin  aspart  0-9 Units Subcutaneous TID WC  ipratropium-albuterol   3 mL Nebulization TID   levothyroxine   125 mcg Oral Q0600   losartan   50 mg Oral Daily    Subjective:   Paige Rhodes reports right foot pain and inability to bear weight; plantar area/ankle.  No overt bleeding; no chest pain, no nausea, no vomiting.  Objective:   Vitals:   03/05/24 1959 03/06/24 0541 03/06/24 0712 03/06/24 1506  BP: (!) 127/52 (!) 143/57  (!) 124/52  Pulse: (!) 59 (!) 59  61  Resp: 19 17  16   Temp: 98.8 F (37.1 C) 98.1 F (36.7 C)  98.4 F (36.9 C)  TempSrc: Oral Oral  Oral  SpO2: 95% 99% 99% 93%  Weight:      Height:        Intake/Output Summary (Last 24 hours) at 03/06/2024 1736 Last data filed at 03/06/2024 1245 Gross per 24 hour  Intake 720 ml  Output 200 ml  Net 520 ml     Wt Readings from Last 3  Encounters:  03/04/24 79.9 kg  05/12/23 73.7 kg  11/26/22 73.9 kg   Physical Exam General exam: Alert, awake, oriented x 3; no chest pain, no nausea or vomiting; no overt bleeding. Respiratory system: Positive scattered rhonchi and mild expiratory wheezing; no using accessory muscle.  Good saturation on chronic supplementation. Cardiovascular system: Rate controlled, no rubs, no gallops, no JVD on exam. Gastrointestinal system: Abdomen is obese, nondistended, soft and nontender.  Positive bowel sounds. Central nervous system: Alert and oriented. No focal neurological deficits. Extremities: No cyanosis or clubbing; trace edema appreciated bilaterally.  Patient reporting tenderness to palpation in the ball of her right foot and ankle area. Skin: No petechiae. Psychiatry: Judgement and insight appear normal. Mood & affect appropriate.    Data Reviewed:  I have personally reviewed following labs    CBC Lab Results  Component Value Date   WBC 8.7 03/06/2024   RBC 3.06 (L) 03/06/2024   HGB 9.6 (L) 03/06/2024   HCT 30.3 (L) 03/06/2024   MCV 99.0 03/06/2024   MCH 31.4 03/06/2024   PLT 177 03/06/2024   MCHC 31.7 03/06/2024   RDW 13.7 03/06/2024   LYMPHSABS 1.1 03/02/2024   MONOABS 0.4 03/02/2024   EOSABS 0.1 03/02/2024   BASOSABS 0.0 03/02/2024   Last metabolic panel Lab Results  Component Value Date   NA 137 03/06/2024   K 3.8 03/06/2024   CL 95 (L) 03/06/2024   CO2 36 (H) 03/06/2024   BUN 34 (H) 03/06/2024   CREATININE 1.19 (H) 03/06/2024   GLUCOSE 91 03/06/2024   GFRNONAA 43 (L) 03/06/2024   GFRAA >60 08/31/2019   CALCIUM  8.0 (L) 03/06/2024   PHOS 3.6 03/06/2024   PROT 6.6 03/03/2024   ALBUMIN 3.2 (L) 03/06/2024   LABGLOB 3.5 08/11/2023   AGRATIO 1.1 08/11/2023   BILITOT 0.5 03/03/2024   ALKPHOS 48 03/03/2024   AST 15 03/03/2024   ALT 14 03/03/2024   ANIONGAP 6 03/06/2024    CBG (last 3)  Recent Labs    03/06/24 0732 03/06/24 1106 03/06/24 1616  GLUCAP 85  244* 91   Radiology Studies: I have personally reviewed the imaging studies  No results found.   Eric Nunnery M.D. [facility] 03/06/2024, 5:36 PM  Available via Epic secure chat 7am-7pm After 7 pm, please refer to night coverage provider listed on amion.

## 2024-03-06 NOTE — Plan of Care (Signed)

## 2024-03-07 DIAGNOSIS — E039 Hypothyroidism, unspecified: Secondary | ICD-10-CM

## 2024-03-07 DIAGNOSIS — K6289 Other specified diseases of anus and rectum: Secondary | ICD-10-CM

## 2024-03-07 DIAGNOSIS — E782 Mixed hyperlipidemia: Secondary | ICD-10-CM

## 2024-03-07 DIAGNOSIS — N1831 Chronic kidney disease, stage 3a: Secondary | ICD-10-CM

## 2024-03-07 DIAGNOSIS — E1165 Type 2 diabetes mellitus with hyperglycemia: Secondary | ICD-10-CM

## 2024-03-07 DIAGNOSIS — K625 Hemorrhage of anus and rectum: Secondary | ICD-10-CM | POA: Diagnosis not present

## 2024-03-07 DIAGNOSIS — J441 Chronic obstructive pulmonary disease with (acute) exacerbation: Secondary | ICD-10-CM

## 2024-03-07 LAB — RENAL FUNCTION PANEL
Albumin: 3.1 g/dL — ABNORMAL LOW (ref 3.5–5.0)
Anion gap: 11 (ref 5–15)
BUN: 42 mg/dL — ABNORMAL HIGH (ref 8–23)
CO2: 33 mmol/L — ABNORMAL HIGH (ref 22–32)
Calcium: 8 mg/dL — ABNORMAL LOW (ref 8.9–10.3)
Chloride: 91 mmol/L — ABNORMAL LOW (ref 98–111)
Creatinine, Ser: 1.44 mg/dL — ABNORMAL HIGH (ref 0.44–1.00)
GFR, Estimated: 35 mL/min — ABNORMAL LOW (ref >60)
Glucose, Bld: 129 mg/dL — ABNORMAL HIGH (ref 70–99)
Phosphorus: 4.2 mg/dL (ref 2.5–4.6)
Potassium: 5 mmol/L (ref 3.5–5.1)
Sodium: 135 mmol/L (ref 135–145)

## 2024-03-07 LAB — GLUCOSE, CAPILLARY
Glucose-Capillary: 166 mg/dL — ABNORMAL HIGH (ref 70–99)
Glucose-Capillary: 291 mg/dL — ABNORMAL HIGH (ref 70–99)
Glucose-Capillary: 157 mg/dL — ABNORMAL HIGH (ref 70–99)
Glucose-Capillary: 205 mg/dL — ABNORMAL HIGH (ref 70–99)

## 2024-03-07 LAB — CBC
HCT: 30 % — ABNORMAL LOW (ref 36.0–46.0)
Hemoglobin: 9.3 g/dL — ABNORMAL LOW (ref 12.0–15.0)
MCH: 30.4 pg (ref 26.0–34.0)
MCHC: 31 g/dL (ref 30.0–36.0)
MCV: 98 fL (ref 80.0–100.0)
Platelets: 159 K/uL (ref 150–400)
RBC: 3.06 MIL/uL — ABNORMAL LOW (ref 3.87–5.11)
RDW: 13.8 % (ref 11.5–15.5)
WBC: 10 K/uL (ref 4.0–10.5)
nRBC: 0 % (ref 0.0–0.2)

## 2024-03-07 MED ORDER — CIPROFLOXACIN IN D5W 400 MG/200ML IV SOLN
400.0000 mg | INTRAVENOUS | Status: AC
  Administered 2024-03-08: 400 mg via INTRAVENOUS

## 2024-03-07 MED ORDER — METRONIDAZOLE 500 MG/100ML IV SOLN
500.0000 mg | INTRAVENOUS | Status: AC
  Administered 2024-03-08: 500 mg via INTRAVENOUS

## 2024-03-07 MED ORDER — CHLORHEXIDINE GLUCONATE CLOTH 2 % EX PADS
6.0000 | MEDICATED_PAD | Freq: Once | CUTANEOUS | Status: AC
  Administered 2024-03-07: 6 via TOPICAL

## 2024-03-07 MED ORDER — TRAZODONE HCL 50 MG PO TABS
50.0000 mg | ORAL_TABLET | Freq: Every evening | ORAL | Status: DC | PRN
  Administered 2024-03-07 – 2024-03-10 (×4): 50 mg via ORAL
  Filled 2024-03-07 (×4): qty 1

## 2024-03-07 MED ORDER — CHLORHEXIDINE GLUCONATE CLOTH 2 % EX PADS
6.0000 | MEDICATED_PAD | Freq: Once | CUTANEOUS | Status: AC
  Administered 2024-03-08: 6 via TOPICAL

## 2024-03-07 NOTE — Plan of Care (Signed)

## 2024-03-07 NOTE — Progress Notes (Signed)
 [facility]                                                                              Paige Rhodes, is a 89 y.o. female, DOB - 31-May-1932, FMW:[id number] Admit date - 03/02/2024    Outpatient Primary MD for the patient is [name], MD  LOS - 4  days  Chief Complaint  Patient presents with   Hematochezia       Brief summary   Patient is a 89 year old female with HTN, HLP, DM type II, COPD with chronic respiratory failure on 4 L O2 via [place] at baseline, hypothyroidism, GERD, breast CA status postlumpectomy, CKD stage IIIa presented to ED with rectal bleeding.  Patient reported 3 episodes of bowel movement with bright red blood in the commode, no abdominal pain, fevers or chills. FOBT positive, CT abdomen pelvis showed no acute intra-abdominal or pelvic pathology, sigmoid diverticulosis no bowel obstruction.  Assessment & Plan    Rectal bleed BRBPR - Appreciate assistance and recommendation by GI service - Colonoscopy demonstrating the presence of diverticulosis, polyps and also lesion near anal area with concern for malignancy; biopsy taken and recommendations for transanal resection provided. - Appreciate assistance and recommendation by general surgery; plan is for surgical removal of anal lesion on 03/08/2024. - No overt bleeding reported - Continue to follow hemoglobin trend. - Patient denies abdominal pain, nausea and vomiting    Acquired hypothyroidism - Continue Synthroid . - Continue to follow outpatient thyroid  panel.    Mixed hyperlipidemia -Continue Lipitor - Heart healthy/low-fat diet discussed with patient.    Essential hypertension - Stable labetalol - Continue to follow vital signs.    COPD (chronic obstructive pulmonary disease) (HCC),  Chronic respiratory failure with hypoxia (HCC) - Currently no acute wheezing - Continue chronic oxygen  supplementation (4 L at baseline) -Continue as needed bronchodilator management.    Type 2 diabetes  mellitus with hyperglycemia (HCC) Hemoglobin A1c 5.4 CBG (last 3)  Recent Labs    03/07/24 0737 03/07/24 1144 03/07/24 1633  GLUCAP 166* 291* 157*  -Continue sliding scale insulin , sensitive -Continue to follow CBG fluctuation and adjust hypoglycemic regimen as needed.   CKD stage 3A - Stable and at baseline - Continue to follow renal function trend.  Obesity class 1  Estimated body mass index is 32.22 kg/m as calculated from the following:   Height as of this encounter: 5' 2 (1.575 m).   Weight as of this encounter: 79.9 kg.  Acute Gout attack - Continue treatment with Uloric  and prednisone  - Patient reporting improvement in right foot pain. Code Status: Full CODE STATUS DVT Prophylaxis:  SCD's Start: 03/07/24 0842 SCDs Start: 03/03/24 0105   Level of Care: Level of care: Med-Surg Family Communication: Updated patient Disposition Plan:      Remains inpatient appropriate:   Hopefully DC home once medically stable.   Procedures:    Consultants:   Gastroenterology  Antimicrobials:   Anti-infectives (From admission, onward)    Start     Dose/Rate Route Frequency Ordered Stop   03/08/24 1200  metroNIDAZOLE  (FLAGYL ) IVPB 500 mg       Placed in And Linked  Group   500 mg 100 mL/hr over 60 Minutes Intravenous On call to O.R. 03/07/24 9158 03/09/24 0559   03/08/24 1200  ciprofloxacin  (CIPRO ) IVPB 400 mg       Placed in And Linked Group   400 mg 200 mL/hr over 60 Minutes Intravenous On call to O.R. 03/07/24 0841 03/09/24 0559       Medications  atorvastatin   10 mg Oral QHS   carvedilol   25 mg Oral BID WC   Chlorhexidine  Gluconate Cloth  6 each Topical Once   And   [START ON 03/08/2024] Chlorhexidine  Gluconate Cloth  6 each Topical Once   febuxostat   40 mg Oral Daily   insulin  aspart  0-5 Units Subcutaneous QHS   insulin  aspart  0-9 Units Subcutaneous TID WC   ipratropium-albuterol   3 mL Nebulization TID   levothyroxine   125 mcg Oral Q0600   losartan   50  mg Oral Daily   predniSONE   20 mg Oral Daily    Subjective:   Zandria Roosevelt no chest pain, no shortness of breath, no nausea, no vomiting.  Reports improvement in right foot pain.  No overt bleeding appreciated.  Objective:   Vitals:   03/07/24 0419 03/07/24 0735 03/07/24 1332 03/07/24 1422  BP: (!) 127/53   136/60  Pulse: (!) 57   65  Resp: 20   20  Temp: 97.7 F (36.5 C)   97.7 F (36.5 C)  TempSrc: Oral   Oral  SpO2: 96% 97% 99% 98%  Weight:      Height:        Intake/Output Summary (Last 24 hours) at 03/07/2024 1707 Last data filed at 03/07/2024 1300 Gross per 24 hour  Intake 240 ml  Output 800 ml  Net -560 ml     Wt Readings from Last 3 Encounters:  03/04/24 79.9 kg  05/12/23 73.7 kg  11/26/22 73.9 kg   Physical Exam General exam: Alert, awake, oriented x 3; no chest pain, no nausea, no overt bleeding. Respiratory system: Good saturation on chronic supplementation. Cardiovascular system:RRR. No rubs or gallops; no JVD. Gastrointestinal system: Abdomen is nondistended, soft and nontender.  Positive bowel sounds. Central nervous system:  No focal neurological deficits. Extremities: No cyanosis or clubbing; trace edema appreciated bilaterally.  Patient reports improvement in her right foot tenderness. Skin: No petechiae. Psychiatry: Judgement and insight appear normal. Mood & affect appropriate.    Data Reviewed:  I have personally reviewed following labs    CBC Lab Results  Component Value Date   WBC 10.0 03/07/2024   RBC 3.06 (L) 03/07/2024   HGB 9.3 (L) 03/07/2024   HCT 30.0 (L) 03/07/2024   MCV 98.0 03/07/2024   MCH 30.4 03/07/2024   PLT 159 03/07/2024   MCHC 31.0 03/07/2024   RDW 13.8 03/07/2024   LYMPHSABS 1.1 03/02/2024   MONOABS 0.4 03/02/2024   EOSABS 0.1 03/02/2024   BASOSABS 0.0 03/02/2024   Last metabolic panel Lab Results  Component Value Date   NA 135 03/07/2024   K 5.0 03/07/2024   CL 91 (L) 03/07/2024   CO2 33 (H) 03/07/2024    BUN 42 (H) 03/07/2024   CREATININE 1.44 (H) 03/07/2024   GLUCOSE 129 (H) 03/07/2024   GFRNONAA 35 (L) 03/07/2024   GFRAA >60 08/31/2019   CALCIUM  8.0 (L) 03/07/2024   PHOS 4.2 03/07/2024   PROT 6.6 03/03/2024   ALBUMIN 3.1 (L) 03/07/2024   LABGLOB 3.5 08/11/2023   AGRATIO 1.1 08/11/2023   BILITOT 0.5 03/03/2024  ALKPHOS 48 03/03/2024   AST 15 03/03/2024   ALT 14 03/03/2024   ANIONGAP 11 03/07/2024    CBG (last 3)  Recent Labs    03/07/24 0737 03/07/24 1144 03/07/24 1633  GLUCAP 166* 291* 157*   Radiology Studies: I have personally reviewed the imaging studies  No results found.   Eric Nunnery M.D. [facility] 03/07/2024, 5:07 PM  Available via Epic secure chat 7am-7pm After 7 pm, please refer to night coverage provider listed on amion.

## 2024-03-07 NOTE — Progress Notes (Signed)
 [facility]  Discussed today that she would sign the consent forms. Discussed that she would be in the hospital a few days after surgery to ensure she was not bleeding and able to have Bms.   BP (!) 127/53 (BP Location: Right Arm)   Pulse (!) 57   Temp 97.7 F (36.5 C) (Oral)   Resp 20   Ht 5' 2 (1.575 m)   Wt 79.9 kg   SpO2 97%   BMI 32.22 kg/m  Sitting in the chair.  O2 in place  Patient with rectal mass. Plan for excision tomorrow. All questions answered. Reviewed risk yesterday with patient and family.   [name], MD [facility] [address] [place], [place] [address] [phone] (office)

## 2024-03-07 NOTE — Plan of Care (Signed)
   Problem: Activity: Goal: Risk for activity intolerance will decrease Outcome: Progressing

## 2024-03-08 ENCOUNTER — Encounter (HOSPITAL_COMMUNITY): Payer: Self-pay | Admitting: Certified Registered Nurse Anesthetist

## 2024-03-08 ENCOUNTER — Encounter (HOSPITAL_COMMUNITY): Payer: Self-pay | Admitting: Internal Medicine

## 2024-03-08 ENCOUNTER — Inpatient Hospital Stay (HOSPITAL_COMMUNITY): Payer: Self-pay | Admitting: Certified Registered Nurse Anesthetist

## 2024-03-08 ENCOUNTER — Encounter (HOSPITAL_COMMUNITY)
Admission: EM | Disposition: A | Discharge status: Hospice | Payer: Self-pay | Source: Home / Self Care | Attending: Internal Medicine

## 2024-03-08 DIAGNOSIS — E039 Hypothyroidism, unspecified: Secondary | ICD-10-CM | POA: Diagnosis not present

## 2024-03-08 DIAGNOSIS — I10 Essential (primary) hypertension: Secondary | ICD-10-CM

## 2024-03-08 DIAGNOSIS — E1165 Type 2 diabetes mellitus with hyperglycemia: Secondary | ICD-10-CM | POA: Diagnosis not present

## 2024-03-08 DIAGNOSIS — K6289 Other specified diseases of anus and rectum: Secondary | ICD-10-CM

## 2024-03-08 DIAGNOSIS — K625 Hemorrhage of anus and rectum: Secondary | ICD-10-CM | POA: Diagnosis not present

## 2024-03-08 DIAGNOSIS — J449 Chronic obstructive pulmonary disease, unspecified: Secondary | ICD-10-CM

## 2024-03-08 DIAGNOSIS — Z87891 Personal history of nicotine dependence: Secondary | ICD-10-CM

## 2024-03-08 HISTORY — PX: TRANSANAL EXCISION OF RECTAL MASS: SHX6134

## 2024-03-08 LAB — GLUCOSE, CAPILLARY
Glucose-Capillary: 126 mg/dL — ABNORMAL HIGH (ref 70–99)
Glucose-Capillary: 109 mg/dL — ABNORMAL HIGH (ref 70–99)
Glucose-Capillary: 108 mg/dL — ABNORMAL HIGH (ref 70–99)
Glucose-Capillary: 113 mg/dL — ABNORMAL HIGH (ref 70–99)
Glucose-Capillary: 191 mg/dL — ABNORMAL HIGH (ref 70–99)
Glucose-Capillary: 183 mg/dL — ABNORMAL HIGH (ref 70–99)

## 2024-03-08 LAB — SURGICAL PCR SCREEN
MRSA, PCR: NEGATIVE
Staphylococcus aureus: NEGATIVE

## 2024-03-08 SURGERY — EXCISION, MASS, RECTUM, ANAL APPROACH
Anesthesia: General | Site: Rectum

## 2024-03-08 MED ORDER — HYDROMORPHONE HCL 1 MG/ML IJ SOLN: 0.5000 mg | INTRAMUSCULAR | Status: DC | PRN

## 2024-03-08 MED ORDER — LIDOCAINE VISCOUS HCL 2 % MT SOLN
OROMUCOSAL | Status: AC
Start: 2024-03-08 — End: 2024-03-08
  Filled 2024-03-08: qty 15

## 2024-03-08 MED ORDER — ORAL CARE MOUTH RINSE: 15.0000 mL | Freq: Once | OROMUCOSAL | Status: DC

## 2024-03-08 MED ORDER — CHLORHEXIDINE GLUCONATE 0.12 % MT SOLN
OROMUCOSAL | Status: AC
  Filled 2024-03-08: qty 15

## 2024-03-08 MED ORDER — FENTANYL CITRATE (PF) 100 MCG/2ML IJ SOLN
INTRAMUSCULAR | Status: AC
  Filled 2024-03-08: qty 2

## 2024-03-08 MED ORDER — LIDOCAINE HCL (PF) 1 % IJ SOLN
INTRAMUSCULAR | Status: AC
  Filled 2024-03-08: qty 30

## 2024-03-08 MED ORDER — CHLORHEXIDINE GLUCONATE 0.12 % MT SOLN
15.0000 mL | Freq: Once | OROMUCOSAL | Status: AC
  Administered 2024-03-08: 15 mL via OROMUCOSAL

## 2024-03-08 MED ORDER — TRAMADOL HCL 50 MG PO TABS: 50.0000 mg | ORAL_TABLET | Freq: Four times a day (QID) | ORAL | Status: DC | PRN

## 2024-03-08 MED ORDER — PROPOFOL 500 MG/50ML IV EMUL
INTRAVENOUS | Status: AC
Start: 2024-03-08 — End: 2024-03-08
  Filled 2024-03-08: qty 50

## 2024-03-08 MED ORDER — LIDOCAINE VISCOUS HCL 2 % MT SOLN
OROMUCOSAL | Status: DC | PRN
  Administered 2024-03-08: 1 via OROMUCOSAL

## 2024-03-08 MED ORDER — LACTATED RINGERS IV SOLN: INTRAVENOUS | Status: DC

## 2024-03-08 MED ORDER — BUPIVACAINE HCL (PF) 0.5 % IJ SOLN
INTRAMUSCULAR | Status: AC
Start: 2024-03-08 — End: 2024-03-08
  Filled 2024-03-08: qty 30

## 2024-03-08 MED ORDER — CHLORHEXIDINE GLUCONATE 0.12 % MT SOLN: 15.0000 mL | Freq: Once | OROMUCOSAL | Status: DC

## 2024-03-08 MED ORDER — LIDOCAINE 2% (20 MG/ML) 5 ML SYRINGE
INTRAMUSCULAR | Status: DC | PRN
  Administered 2024-03-08: 50 mg via INTRAVENOUS

## 2024-03-08 MED ORDER — LIDOCAINE 2% (20 MG/ML) 5 ML SYRINGE
INTRAMUSCULAR | Status: AC
  Filled 2024-03-08: qty 5

## 2024-03-08 MED ORDER — OXYCODONE HCL 5 MG/5ML PO SOLN: 5.0000 mg | Freq: Once | ORAL | Status: DC | PRN

## 2024-03-08 MED ORDER — LIDOCAINE HCL (PF) 1 % IJ SOLN
INTRAMUSCULAR | Status: DC | PRN
Start: 2024-03-08 — End: 2024-03-08
  Administered 2024-03-08: 25 mL

## 2024-03-08 MED ORDER — CIPROFLOXACIN IN D5W 400 MG/200ML IV SOLN
INTRAVENOUS | Status: AC
  Filled 2024-03-08: qty 200

## 2024-03-08 MED ORDER — ACETAMINOPHEN 325 MG PO TABS
650.0000 mg | ORAL_TABLET | Freq: Four times a day (QID) | ORAL | Status: DC | PRN
  Administered 2024-03-08 – 2024-03-10 (×2): 650 mg via ORAL
  Filled 2024-03-08 (×3): qty 2

## 2024-03-08 MED ORDER — DOCUSATE SODIUM 100 MG PO CAPS
100.0000 mg | ORAL_CAPSULE | Freq: Two times a day (BID) | ORAL | Status: DC
  Administered 2024-03-08 – 2024-03-11 (×7): 100 mg via ORAL
  Filled 2024-03-08 (×7): qty 1

## 2024-03-08 MED ORDER — EPHEDRINE 5 MG/ML INJ
INTRAVENOUS | Status: AC
  Filled 2024-03-08: qty 5

## 2024-03-08 MED ORDER — ONDANSETRON HCL 4 MG/2ML IJ SOLN: 4.0000 mg | Freq: Once | INTRAMUSCULAR | Status: DC | PRN

## 2024-03-08 MED ORDER — EPHEDRINE SULFATE-NACL 50-0.9 MG/10ML-% IV SOSY
PREFILLED_SYRINGE | INTRAVENOUS | Status: DC | PRN
  Administered 2024-03-08: 5 mg via INTRAVENOUS

## 2024-03-08 MED ORDER — SODIUM CHLORIDE 0.9 % IR SOLN
Status: DC | PRN
  Administered 2024-03-08: 1000 mL

## 2024-03-08 MED ORDER — OXYCODONE HCL 5 MG PO TABS: 5.0000 mg | ORAL_TABLET | Freq: Once | ORAL | Status: DC | PRN

## 2024-03-08 MED ORDER — PROPOFOL 10 MG/ML IV BOLUS
INTRAVENOUS | Status: DC | PRN
Start: 2024-03-08 — End: 2024-03-08
  Administered 2024-03-08: 50 ug/kg/min via INTRAVENOUS
  Administered 2024-03-08: 20 mg via INTRAVENOUS

## 2024-03-08 MED ORDER — METRONIDAZOLE 500 MG/100ML IV SOLN
INTRAVENOUS | Status: AC
  Filled 2024-03-08: qty 100

## 2024-03-08 MED ORDER — FENTANYL CITRATE PF 50 MCG/ML IJ SOSY: 25.0000 ug | PREFILLED_SYRINGE | INTRAMUSCULAR | Status: DC | PRN

## 2024-03-08 SURGICAL SUPPLY — 26 items
BAG HAMPER (MISCELLANEOUS) ×1 IMPLANT
COVER LIGHT HANDLE STERIS (MISCELLANEOUS) ×2 IMPLANT
DISSECTOR SURG LIGASURE 21 (MISCELLANEOUS) ×1 IMPLANT
ELECTRODE REM PT RTRN 9FT ADLT (ELECTROSURGICAL) ×1 IMPLANT
GAUZE 4X4 16PLY ~~LOC~~+RFID DBL (SPONGE) ×1 IMPLANT
GAUZE PAD ABD 8X10 STRL (GAUZE/BANDAGES/DRESSINGS) IMPLANT
GAUZE SPONGE 4X4 12PLY STRL (GAUZE/BANDAGES/DRESSINGS) ×1 IMPLANT
GLOVE BIO SURGEON STRL SZ 6.5 (GLOVE) ×1 IMPLANT
GLOVE BIOGEL PI IND STRL 6.5 (GLOVE) ×1 IMPLANT
GLOVE BIOGEL PI IND STRL 7.0 (GLOVE) ×2 IMPLANT
GOWN STRL REUS W/TWL LRG LVL3 (GOWN DISPOSABLE) ×2 IMPLANT
HEMOSTAT SURGICEL 4X8 (HEMOSTASIS) ×1 IMPLANT
KIT TURNOVER CYSTO (KITS) ×1 IMPLANT
MANIFOLD NEPTUNE II (INSTRUMENTS) ×1 IMPLANT
NDL HYPO 21X1.5 SAFETY (NEEDLE) ×1 IMPLANT
NEEDLE HYPO 21X1.5 SAFETY (NEEDLE) ×1 IMPLANT
NS IRRIG 1000ML POUR BTL (IV SOLUTION) ×1 IMPLANT
PACK PERI GYN (CUSTOM PROCEDURE TRAY) ×1 IMPLANT
PAD ARMBOARD POSITIONER FOAM (MISCELLANEOUS) ×1 IMPLANT
POSITIONER HEAD 8X9X4 ADT (SOFTGOODS) ×1 IMPLANT
SET BASIN LINEN APH (SET/KITS/TRAYS/PACK) ×1 IMPLANT
SPONGE SURGIFOAM ABS GEL 100 (HEMOSTASIS) ×1 IMPLANT
SURGILUBE 2OZ TUBE FLIPTOP (MISCELLANEOUS) ×1 IMPLANT
SUT SILK 0 FSL (SUTURE) ×1 IMPLANT
SYR 30ML LL (SYRINGE) ×2 IMPLANT
SYR BULB IRRIG 60ML STRL (SYRINGE) IMPLANT

## 2024-03-08 NOTE — Progress Notes (Signed)
 [facility]                                                                              Paige Rhodes, is a 89 y.o. female, DOB - November 27, 1931, FMW:[id number] Admit date - 03/02/2024    Outpatient Primary MD for the patient is [name], MD  LOS - 5  days  Chief Complaint  Patient presents with   Hematochezia       Brief summary   Patient is a 89 year old female with HTN, HLP, DM type II, COPD with chronic respiratory failure on 4 L O2 via [place] at baseline, hypothyroidism, GERD, breast CA status postlumpectomy, CKD stage IIIa presented to ED with rectal bleeding.  Patient reported 3 episodes of bowel movement with bright red blood in the commode, no abdominal pain, fevers or chills. FOBT positive, CT abdomen pelvis showed no acute intra-abdominal or pelvic pathology, sigmoid diverticulosis no bowel obstruction.  Assessment & Plan    Rectal bleed BRBPR - Appreciate assistance and recommendation by GI service - Colonoscopy demonstrating the presence of diverticulosis, polyps and also lesion near anal area with concern for malignancy; biopsy taken and recommendations for transanal resection provided. - Appreciate assistance and recommendation by general surgery; plan is for surgical removal of anal lesion later today 03/08/2024. -Remains n.p.o. for now. - No overt bleeding reported - Continue to follow hemoglobin trend. - Patient denies abdominal pain, nausea and vomiting. - Will follow general surgery postoperative care recommendations and resumption of bowel regimen.    Acquired hypothyroidism - Continue Synthroid . - Continue to follow outpatient thyroid  panel.    Mixed hyperlipidemia -Continue Lipitor - Heart healthy/low-fat diet discussed with patient.    Essential hypertension - Stable labetalol - Continue to follow vital signs.    COPD (chronic obstructive pulmonary disease) (HCC),  Chronic respiratory failure with hypoxia (HCC) - Currently no acute  wheezing - Continue chronic oxygen  supplementation (4 L at baseline) -Continue as needed bronchodilator management.    Type 2 diabetes mellitus with hyperglycemia (HCC) Hemoglobin A1c 5.4 CBG (last 3)  Recent Labs    03/08/24 0853 03/08/24 1044 03/08/24 1128  GLUCAP 109* 108* 113*  -Continue sliding scale insulin , sensitive -Continue to follow CBG fluctuation and adjust hypoglycemic regimen as needed.   CKD stage 3A - Stable and at baseline - Continue to follow renal function trend.  Obesity class 1  Estimated body mass index is 32.22 kg/m as calculated from the following:   Height as of this encounter: 5' 2 (1.575 m).   Weight as of this encounter: 79.9 kg.  Acute Gout attack - Continue treatment with Uloric  and prednisone  - Patient reporting improvement in right foot pain. Code Status: Full CODE STATUS DVT Prophylaxis:  SCDs Start: 03/03/24 0105   Level of Care: Level of care: Med-Surg Family Communication: Updated patient Disposition Plan:      Remains inpatient appropriate:   Hopefully DC home once medically stable.   Procedures:    Consultants:   Gastroenterology  Antimicrobials:   Anti-infectives (From admission, onward)    Start     Dose/Rate Route Frequency Ordered Stop   03/08/24 1200  metroNIDAZOLE  (  FLAGYL ) IVPB 500 mg       Placed in And Linked Group   500 mg 100 mL/hr over 60 Minutes Intravenous On call to O.R. 03/07/24 0841 03/08/24 1021   03/08/24 1200  ciprofloxacin  (CIPRO ) IVPB 400 mg       Placed in And Linked Group   400 mg 200 mL/hr over 60 Minutes Intravenous On call to O.R. 03/07/24 0841 03/08/24 1040   03/08/24 0856  ciprofloxacin  (CIPRO ) 400 MG/200ML IVPB       Note to Pharmacy: Romona Shaver E: cabinet override      03/08/24 0856 03/08/24 1010   03/08/24 0856  metroNIDAZOLE  (FLAGYL ) 500 MG/100ML IVPB       Note to Pharmacy: Romona Shaver E: cabinet override      03/08/24 0856 03/08/24 0951       Medications   atorvastatin   10 mg Oral QHS   carvedilol   25 mg Oral BID WC   chlorhexidine        febuxostat   40 mg Oral Daily   insulin  aspart  0-5 Units Subcutaneous QHS   insulin  aspart  0-9 Units Subcutaneous TID WC   ipratropium-albuterol   3 mL Nebulization TID   levothyroxine   125 mcg Oral Q0600   losartan   50 mg Oral Daily   predniSONE   20 mg Oral Daily    Subjective:   Kamiryn Nedved n.p.o. for anticipated surgical procedure; no chest pain, no nausea, no vomiting, no overt bleeding.  Reports right foot feels better.  Objective:   Vitals:   03/08/24 1030 03/08/24 1051 03/08/24 1100 03/08/24 1128  BP: 136/63  (!) 114/56 132/65  Pulse: (!) 58 (!) 57 (!) 56 (!) 56  Resp: 18 (!) 24 20 19   Temp:  (!) 97.5 F (36.4 C)  (!) 97.5 F (36.4 C)  TempSrc:    Oral  SpO2: 100% 100% 100% 100%  Weight:      Height:        Intake/Output Summary (Last 24 hours) at 03/08/2024 1220 Last data filed at 03/08/2024 1030 Gross per 24 hour  Intake 450 ml  Output 1500 ml  Net -1050 ml     Wt Readings from Last 3 Encounters:  03/08/24 79.9 kg  05/12/23 73.7 kg  11/26/22 73.9 kg   Physical Exam General exam: Alert, awake, oriented x 3; in no major distress.  Denies overt bleeding. Respiratory system: Good saturation on chronic supplementation. Cardiovascular system:RRR. No rubs or gallops; no JVD. Gastrointestinal system: Abdomen is obese, nondistended, soft and nontender. No organomegaly or masses felt. Normal bowel sounds heard. Central nervous system: Generally weak.  No focal neurological deficits. Extremities: No cyanosis or clubbing; trace edema appreciated bilateral.  Patient reports right foot feels better. Skin: No petechiae. Psychiatry: Judgement and insight appear normal. Mood & affect appropriate.    Data Reviewed:  I have personally reviewed following labs    CBC Lab Results  Component Value Date   WBC 10.0 03/07/2024   RBC 3.06 (L) 03/07/2024   HGB 9.3 (L) 03/07/2024   HCT 30.0  (L) 03/07/2024   MCV 98.0 03/07/2024   MCH 30.4 03/07/2024   PLT 159 03/07/2024   MCHC 31.0 03/07/2024   RDW 13.8 03/07/2024   LYMPHSABS 1.1 03/02/2024   MONOABS 0.4 03/02/2024   EOSABS 0.1 03/02/2024   BASOSABS 0.0 03/02/2024   Last metabolic panel Lab Results  Component Value Date   NA 135 03/07/2024   K 5.0 03/07/2024   CL 91 (L) 03/07/2024  CO2 33 (H) 03/07/2024   BUN 42 (H) 03/07/2024   CREATININE 1.44 (H) 03/07/2024   GLUCOSE 129 (H) 03/07/2024   GFRNONAA 35 (L) 03/07/2024   GFRAA >60 08/31/2019   CALCIUM  8.0 (L) 03/07/2024   PHOS 4.2 03/07/2024   PROT 6.6 03/03/2024   ALBUMIN 3.1 (L) 03/07/2024   LABGLOB 3.5 08/11/2023   AGRATIO 1.1 08/11/2023   BILITOT 0.5 03/03/2024   ALKPHOS 48 03/03/2024   AST 15 03/03/2024   ALT 14 03/03/2024   ANIONGAP 11 03/07/2024    CBG (last 3)  Recent Labs    03/08/24 0853 03/08/24 1044 03/08/24 1128  GLUCAP 109* 108* 113*   Radiology Studies: I have personally reviewed the imaging studies  No results found.   Eric Nunnery M.D. [facility] 03/08/2024, 12:20 PM  Available via Epic secure chat 7am-7pm After 7 pm, please refer to night coverage provider listed on amion.

## 2024-03-08 NOTE — Progress Notes (Signed)
 GI signing off, please reach out if needed.

## 2024-03-08 NOTE — Op Note (Addendum)
 [facility] Operative Note  03/08/24  Preoperative Diagnosis: Rectal Mass    Postoperative Diagnosis: Same   Procedure(s) Performed: Transanal excision of rectal mass    Surgeon: Paige BROCKS. Kallie, MD   Assistants: No qualified resident was available    Anesthesia: Sedation; lidocaine  1%   Anesthesiologist: Paige Yvonna PARAS, MD    Specimens:  Rectal mass    Estimated Blood Loss: Minimal   Blood Replacement: None    Complications: None   Wound Class: Dirty -anal canal with some stool    Operative Indications: Paige Rhodes is a 89 yo with a rectal mass that was noted on colonoscopy. They were unable to remove it during the colonoscopy and we discussed transanal excision to remove it to get a diagnosis but also to help with her discomfort and bleeding. Discussed risk of bleeding, infection, finding cancer. Discussed that with her age and co-morbidities larger surgeries would be unlikely to be offered if this was a cancer.   Findings: Posterior Wall Rectal mass on a pedicle    Procedure: The patient was taken to the operating room and placed on her left side down. Monitored sedation was induced. Intravenous antibiotics were given due to her age and the excision.  The perianal region and buttocks were prepped and draped in the usual sterile fashion.   A digital rectal exam revealed the mass which I was able to eviscerate into the anal canal. This felt like it was coming from the posterior wall and had a long pedicle.  I placed an anoscope but could not get to the pedicle to excise the mass with the anoscope in place. With careful manual pressure to pull the mass out of the canal and away from the muscle, I was able to get a good pedicle that did not involve any muscle. Lidocaine  was injected. I amputated this with the ligasure. The mass was removed in its entirety.  The anoscope was inserted and the mucosa at the posterior wall was made hemostatic where the mass was  excised.    A gelfoam with viscous lidocaine  was inserted to help with hemostasis.  All counts were correct at the end of the case. The patient was awakened from anesthesia without complication.  The patient went to the PACU in stable condition.   Paige Kallie, MD [facility] [address] Paige Rhodes [place], [place] [address] [phone] (office)

## 2024-03-08 NOTE — Anesthesia Preprocedure Evaluation (Signed)
 Anesthesia Evaluation  Patient identified by MRN, date of birth, ID band Patient awake    Reviewed: Allergy & Precautions, H&P , NPO status , Patient's Chart, lab work & pertinent test results, reviewed documented beta blocker date and time   Airway Mallampati: II  TM Distance: >3 FB Neck ROM: full    Dental no notable dental hx.    Pulmonary neg pulmonary ROS, COPD, former smoker   Pulmonary exam normal breath sounds clear to auscultation       Cardiovascular Exercise Tolerance: Good hypertension, negative cardio ROS  Rhythm:regular Rate:Normal     Neuro/Psych negative neurological ROS  negative psych ROS   GI/Hepatic negative GI ROS, Neg liver ROS,GERD  ,,  Endo/Other  negative endocrine ROSdiabetesHypothyroidism    Renal/GU Renal diseasenegative Renal ROS  negative genitourinary   Musculoskeletal   Abdominal   Peds  Hematology negative hematology ROS (+) Blood dyscrasia, anemia   Anesthesia Other Findings   Reproductive/Obstetrics negative OB ROS                              Anesthesia Physical Anesthesia Plan  ASA: 3  Anesthesia Plan: General   Post-op Pain Management:    Induction:   PONV Risk Score and Plan: Propofol  infusion  Airway Management Planned:   Additional Equipment:   Intra-op Plan:   Post-operative Plan:   Informed Consent: I have reviewed the patients History and Physical, chart, labs and discussed the procedure including the risks, benefits and alternatives for the proposed anesthesia with the patient or authorized representative who has indicated his/her understanding and acceptance.     Dental Advisory Given  Plan Discussed with: CRNA  Anesthesia Plan Comments:         Anesthesia Quick Evaluation

## 2024-03-08 NOTE — Interval H&P Note (Signed)
 History and Physical Interval Note:  03/08/2024 8:53 AM  Paige Rhodes  has presented today for surgery, with the diagnosis of rectal mass.  The various methods of treatment have been discussed with the patient and family. After consideration of risks, benefits and other options for treatment, the patient has consented to  Procedure(s) with comments: EXCISION, MASS, RECTUM, ANAL APPROACH (N/A) - would hope could do sedation and lay on side to remove this with ligasure as a surgical intervention.  The patient's history has been reviewed, patient examined, no change in status, stable for surgery.  I have reviewed the patient's chart and labs.  Questions were answered to the patient's satisfaction.   Hoping given location and mobility, will do sedation and try to amputate this mass.   Manuelita JAYSON Pander

## 2024-03-08 NOTE — Anesthesia Postprocedure Evaluation (Signed)
 Anesthesia Post Note  Patient: Paige Rhodes  Procedure(s) Performed: EXCISION, MASS, RECTUM, ANAL APPROACH (Rectum)  Patient location during evaluation: Phase II Anesthesia Type: General Level of consciousness: awake Pain management: pain level controlled Vital Signs Assessment: post-procedure vital signs reviewed and stable Respiratory status: spontaneous breathing and respiratory function stable Cardiovascular status: blood pressure returned to baseline and stable Postop Assessment: no headache and no apparent nausea or vomiting Anesthetic complications: no Comments: Late entry   No notable events documented.   Last Vitals:  Vitals:   03/08/24 1244 03/08/24 1355  BP: (!) 140/76   Pulse: (!) 53   Resp: 20   Temp: (!) 36.4 C   SpO2: 99% 99%    Last Pain:  Vitals:   03/08/24 1312  TempSrc:   PainSc: 0-No pain                 Yvonna JINNY Bosworth

## 2024-03-08 NOTE — Transfer of Care (Addendum)
 Immediate Anesthesia Transfer of Care Note  Patient: Paige Rhodes  Procedure(s) Performed: EXCISION, MASS, RECTUM, ANAL APPROACH (Rectum)  Patient Location: PACU  Anesthesia Type:MAC  Level of Consciousness: awake, alert , oriented, and patient cooperative  Airway & Oxygen  Therapy: Patient Spontanous Breathing and Patient connected to face mask oxygen   Post-op Assessment: Report given to RN and Post -op Vital signs reviewed and stable  Post vital signs: Reviewed and stable  Last Vitals:  Vitals Value Taken Time  BP 111/52 03/08/24 10:24  Temp    Pulse 58 03/08/24 10:24  Resp 17 03/08/24 10:24  SpO2 100 % 03/08/24 10:24  Vitals shown include unfiled device data.  Last Pain:  Vitals:   03/08/24 0858  TempSrc: Oral  PainSc: 0-No pain      Patients Stated Pain Goal: 5 (03/08/24 0858)  Complications: No notable events documented.

## 2024-03-08 NOTE — Progress Notes (Signed)
 [facility]  Updated Melissa. Sedation with transanal mass excision. Patient did well. Will get pathology and diagnosis and hopefully help with patient's discomfort and bleeding.  [name], MD [facility] [address] [place], [place] [address] [phone] (office)

## 2024-03-09 ENCOUNTER — Encounter (HOSPITAL_COMMUNITY): Payer: Self-pay | Admitting: General Surgery

## 2024-03-09 DIAGNOSIS — K6289 Other specified diseases of anus and rectum: Secondary | ICD-10-CM | POA: Diagnosis not present

## 2024-03-09 DIAGNOSIS — E1165 Type 2 diabetes mellitus with hyperglycemia: Secondary | ICD-10-CM | POA: Diagnosis not present

## 2024-03-09 DIAGNOSIS — K625 Hemorrhage of anus and rectum: Secondary | ICD-10-CM | POA: Diagnosis not present

## 2024-03-09 DIAGNOSIS — E039 Hypothyroidism, unspecified: Secondary | ICD-10-CM | POA: Diagnosis not present

## 2024-03-09 DIAGNOSIS — N1831 Chronic kidney disease, stage 3a: Secondary | ICD-10-CM

## 2024-03-09 DIAGNOSIS — J441 Chronic obstructive pulmonary disease with (acute) exacerbation: Secondary | ICD-10-CM

## 2024-03-09 LAB — GLUCOSE, CAPILLARY
Glucose-Capillary: 103 mg/dL — ABNORMAL HIGH (ref 70–99)
Glucose-Capillary: 125 mg/dL — ABNORMAL HIGH (ref 70–99)
Glucose-Capillary: 180 mg/dL — ABNORMAL HIGH (ref 70–99)

## 2024-03-09 LAB — SURGICAL PATHOLOGY

## 2024-03-09 NOTE — Progress Notes (Signed)
 Nurse at bedside,patient alert and oriented times four. Patient out of bed to chair. No c/o pain or discomfort noted.Plan of care on going.

## 2024-03-09 NOTE — TOC CM/SW Note (Signed)
 Pt declines PT recommendation of HHPT. She states she is comfortable c/her current level of home care. She is active c/[facility] and has an aide for four hours/day provided by [facility]. Pt also has O2 and nebulizer [facility]), walker, wc, bsc, shower bench.

## 2024-03-09 NOTE — Plan of Care (Signed)
  Problem: Acute Rehab PT Goals(only PT should resolve) Goal: Pt Will Go Supine/Side To Sit Outcome: Progressing Flowsheets (Taken 03/09/2024 1416) Pt will go Supine/Side to Sit:  Independently  with modified independence Goal: Patient Will Transfer Sit To/From Stand Outcome: Progressing Flowsheets (Taken 03/09/2024 1416) Patient will transfer sit to/from stand:  with modified independence  with supervision Goal: Pt Will Transfer Bed To Chair/Chair To Bed Outcome: Progressing Flowsheets (Taken 03/09/2024 1416) Pt will Transfer Bed to Chair/Chair to Bed:  with modified independence  with supervision Goal: Pt Will Ambulate Outcome: Progressing Flowsheets (Taken 03/09/2024 1416) Pt will Ambulate:  50 feet  with supervision  with rolling walker   2:17 PM, 03/09/24 Lynwood Music, MPT Physical Therapist with [facility] [phone] office [phone] mobile phone

## 2024-03-09 NOTE — Progress Notes (Signed)
 [facility]  Doing well. No complaints. Up in the chair.  BP (!) 155/69 (BP Location: Right Arm)   Pulse (!) 55   Temp 98.2 F (36.8 C) (Oral)   Resp 20   Ht 5' 2 (1.575 m)   Wt 79.9 kg   SpO2 100%   BMI 32.22 kg/m  Sitting up in chair  Patient s/p transanal excision of mass. Doing well.  Colace BID Diet   Future Appointments  Date Time Provider Department Center  04/01/2024  1:15 PM Kallie Manuelita BROCKS, MD [facility] None  06/21/2024  2:00 PM AP[facility] LAB [facility] None  06/21/2024  3:00 PM [facility] NURSE [facility] None  06/28/2024  2:30 PM Pennington, Pleasant HERO, PA-C [facility] None    Manuelita Kallie, MD [facility] [address] [place], [place] [address] [phone] (office)

## 2024-03-09 NOTE — Progress Notes (Signed)
 [facility]                                                                              Paige Rhodes, is a 89 y.o. female, DOB - July 17, 1932, FMW:[id number] Admit date - 03/02/2024    Outpatient Primary MD for the patient is [name], MD  LOS - 6  days  Chief Complaint  Patient presents with   Hematochezia       Brief summary   Patient is a 89 year old female with HTN, HLP, DM type II, COPD with chronic respiratory failure on 4 L O2 via [place] at baseline, hypothyroidism, GERD, breast CA status postlumpectomy, CKD stage IIIa presented to ED with rectal bleeding.  Patient reported 3 episodes of bowel movement with bright red blood in the commode, no abdominal pain, fevers or chills. FOBT positive, CT abdomen pelvis showed no acute intra-abdominal or pelvic pathology, sigmoid diverticulosis no bowel obstruction.  Assessment & Plan    Rectal bleed BRBPR - Appreciate assistance and recommendation by GI service - Colonoscopy demonstrating the presence of diverticulosis, polyps and also lesion near anal area with concern for malignancy; biopsy taken and recommendations for transanal resection provided. - Appreciate assistance and recommendation by general surgery; status post surgical removal of vaginal lesion on 03/08/2024.  Procedure well-tolerated.  Diet has been advanced and patient is hemodynamically stable. - No overt bleeding reported - Continue to follow hemoglobin trend intermittently. - Patient denies abdominal pain, nausea and vomiting. - Will continue to follow general surgery postoperative care recommendations and await for resumption of bowel regimen.    Acquired hypothyroidism - Continue Synthroid . - Continue to follow outpatient thyroid  panel.    Mixed hyperlipidemia -Continue Lipitor - Heart healthy/low-fat diet discussed with patient.    Essential hypertension - Stable labetalol - Continue to follow vital signs.    COPD (chronic obstructive pulmonary  disease) (HCC),  Chronic respiratory failure with hypoxia (HCC) - Currently no acute wheezing - Continue chronic oxygen  supplementation (4 L at baseline) -Continue as needed bronchodilator management.    Type 2 diabetes mellitus with hyperglycemia (HCC) Hemoglobin A1c 5.4 CBG (last 3)  Recent Labs    03/08/24 2122 03/09/24 0711 03/09/24 1110  GLUCAP 183* 103* 125*  -Continue sliding scale insulin , sensitive -Continue to follow CBG fluctuation and adjust hypoglycemic regimen as needed.   CKD stage 3A - Stable and at baseline - Continue to follow renal function trend.  Obesity class 1  Estimated body mass index is 32.22 kg/m as calculated from the following:   Height as of this encounter: 5' 2 (1.575 m).   Weight as of this encounter: 79.9 kg.  Acute Gout attack - Continue treatment with Uloric  and prednisone  - Patient reporting improvement in right foot pain. -Evaluated by physical therapy with recommendation for home health PT; patient reports she feels close to her baseline and adequate to go home with current level of home care and assistance that she already have.   Code Status: Full CODE STATUS DVT Prophylaxis:  SCDs Start: 03/03/24 0105   Level of Care: Level of care: Med-Surg Family Communication: Updated patient Disposition Plan:  Remains inpatient appropriate:   Hopefully DC home once medically stable.   Procedures:    Consultants:   Gastroenterology  Antimicrobials:   Anti-infectives (From admission, onward)    Start     Dose/Rate Route Frequency Ordered Stop   03/08/24 1200  metroNIDAZOLE  (FLAGYL ) IVPB 500 mg       Placed in And Linked Group   500 mg 100 mL/hr over 60 Minutes Intravenous On call to O.R. 03/07/24 0841 03/08/24 1021   03/08/24 1200  ciprofloxacin  (CIPRO ) IVPB 400 mg       Placed in And Linked Group   400 mg 200 mL/hr over 60 Minutes Intravenous On call to O.R. 03/07/24 0841 03/08/24 1040   03/08/24 0856  ciprofloxacin   (CIPRO ) 400 MG/200ML IVPB       Note to Pharmacy: Romona Shaver E: cabinet override      03/08/24 0856 03/08/24 1010   03/08/24 0856  metroNIDAZOLE  (FLAGYL ) 500 MG/100ML IVPB       Note to Pharmacy: Romona Shaver E: cabinet override      03/08/24 0856 03/08/24 0951       Medications  atorvastatin   10 mg Oral QHS   carvedilol   25 mg Oral BID WC   docusate sodium   100 mg Oral BID   febuxostat   40 mg Oral Daily   insulin  aspart  0-5 Units Subcutaneous QHS   insulin  aspart  0-9 Units Subcutaneous TID WC   ipratropium-albuterol   3 mL Nebulization TID   levothyroxine   125 mcg Oral Q0600   losartan   50 mg Oral Daily   predniSONE   20 mg Oral Daily    Subjective:   Paige Rhodes so far tolerating diet; well-tolerated procedure on 03/08/2024.  Patient denies overt bleeding.  Still no bowel movements.  Objective:   Vitals:   03/09/24 0548 03/09/24 0838 03/09/24 0916 03/09/24 1246  BP: (!) 155/69  (!) 139/59 134/67  Pulse: (!) 55  60 63  Resp: 20  19   Temp: 98.2 F (36.8 C)  (!) 97.5 F (36.4 C) 98.1 F (36.7 C)  TempSrc: Oral  Oral Oral  SpO2: 100% 100% 98% 96%  Weight:      Height:        Intake/Output Summary (Last 24 hours) at 03/09/2024 1615 Last data filed at 03/09/2024 1258 Gross per 24 hour  Intake 480 ml  Output --  Net 480 ml     Wt Readings from Last 3 Encounters:  03/08/24 79.9 kg  05/12/23 73.7 kg  11/26/22 73.9 kg   Physical Exam General exam: Alert, awake, oriented x 3; in no acute distress.  Tolerated procedure well.  No bowel movement.  Tolerating diet. Respiratory system: Saturation on chronic supplementation. Cardiovascular system:RRR.  No rubs, no gallops, no JVD. Gastrointestinal system: Abdomen is nondistended, soft and nontender.  Positive bowel sounds. Central nervous system: Generally weak; moving 4 limbs spontaneously.  No focal neurological deficits. Extremities: No cyanosis or clubbing.  Trace edema appreciated bilaterally.  Reports no  significant discomfort on her right foot at the moment. Skin: No petechiae. Psychiatry: Judgement and insight appear normal. Mood & affect appropriate.   Data Reviewed:  I have personally reviewed following labs    CBC Lab Results  Component Value Date   WBC 10.0 03/07/2024   RBC 3.06 (L) 03/07/2024   HGB 9.3 (L) 03/07/2024   HCT 30.0 (L) 03/07/2024   MCV 98.0 03/07/2024   MCH 30.4 03/07/2024   PLT 159 03/07/2024   MCHC 31.0  03/07/2024   RDW 13.8 03/07/2024   LYMPHSABS 1.1 03/02/2024   MONOABS 0.4 03/02/2024   EOSABS 0.1 03/02/2024   BASOSABS 0.0 03/02/2024   Last metabolic panel Lab Results  Component Value Date   NA 135 03/07/2024   K 5.0 03/07/2024   CL 91 (L) 03/07/2024   CO2 33 (H) 03/07/2024   BUN 42 (H) 03/07/2024   CREATININE 1.44 (H) 03/07/2024   GLUCOSE 129 (H) 03/07/2024   GFRNONAA 35 (L) 03/07/2024   GFRAA >60 08/31/2019   CALCIUM  8.0 (L) 03/07/2024   PHOS 4.2 03/07/2024   PROT 6.6 03/03/2024   ALBUMIN 3.1 (L) 03/07/2024   LABGLOB 3.5 08/11/2023   AGRATIO 1.1 08/11/2023   BILITOT 0.5 03/03/2024   ALKPHOS 48 03/03/2024   AST 15 03/03/2024   ALT 14 03/03/2024   ANIONGAP 11 03/07/2024    CBG (last 3)  Recent Labs    03/08/24 2122 03/09/24 0711 03/09/24 1110  GLUCAP 183* 103* 125*   Radiology Studies: I have personally reviewed the imaging studies  No results found.   Eric Nunnery M.D. [facility] 03/09/2024, 4:15 PM  Available via Epic secure chat 7am-7pm After 7 pm, please refer to night coverage provider listed on amion.

## 2024-03-09 NOTE — Evaluation (Signed)
 Physical Therapy Evaluation Patient Details Name: Paige Rhodes MRN: [id number] DOB: 08-Mar-1932 Today's Date: 03/09/2024  History of Present Illness  Paige Rhodes is a 89 y.o. female with medical history significant of hypertension, hyperlipidemia, T2DM, chronic respiratory failure with hypoxia on 4L of oxygen  at baseline, COPD, hypothyroidism, GERD, breast CA s/p lumpectomy, stage IIIa CKD who presents to the emergency department accompanied by daughter due to rectal bleed.  Patient endorsed 3 episodes of bowel movement with noted blood in the commode.  She denies abdominal pain, fever, chills.   Clinical Impression  Patient demonstrates good return for getting into/out of bed with slightly labored movement, able to transfer to/from commode in bathroom using side rail and tolerated walking in room/hallway without loss of balance using RW with SpO2 at 92% while on 4 LPM O2.  Patient limited for gait training mostly due to fatigue and tolerated staying up in chair after therapy. Patient will benefit from continued skilled physical therapy in hospital and recommended venue below to increase strength, balance, endurance for safe ADLs and gait.         If plan is discharge home, recommend the following: A little help with walking and/or transfers;A little help with bathing/dressing/bathroom;Help with stairs or ramp for entrance;Assistance with cooking/housework   Can travel by private vehicle        Equipment Recommendations None recommended by PT  Recommendations for Other Services       Functional Status Assessment Patient has had a recent decline in their functional status and demonstrates the ability to make significant improvements in function in a reasonable and predictable amount of time.     Precautions / Restrictions Precautions Precautions: Fall Recall of Precautions/Restrictions: Intact Restrictions Weight Bearing Restrictions Per Provider Order: No      Mobility  Bed  Mobility Overal bed mobility: Modified Independent             General bed mobility comments: requires HOB raised due to difficulty breathing when lying flat    Transfers Overall transfer level: Needs assistance Equipment used: Rolling walker (2 wheels) Transfers: Sit to/from Stand, Bed to chair/wheelchair/BSC Sit to Stand: Supervision, Contact guard assist   Step pivot transfers: Supervision, Contact guard assist       General transfer comment: increased time, labored movement    Ambulation/Gait Ambulation/Gait assistance: Supervision, Contact guard assist Gait Distance (Feet): 40 Feet Assistive device: Rolling walker (2 wheels) Gait Pattern/deviations: Decreased step length - right, Decreased step length - left, Decreased stride length, Trunk flexed Gait velocity: decreased     General Gait Details: slow slightly labored movement with good return for ambulating in room, hallway without loss of balance, on 4 LPM O2 with SpO2 at 92%  Stairs            Wheelchair Mobility     Tilt Bed    Modified Rankin (Stroke Patients Only)       Balance Overall balance assessment: Needs assistance Sitting-balance support: Feet supported, No upper extremity supported Sitting balance-Leahy Scale: Good Sitting balance - Comments: seated at EOB   Standing balance support: Reliant on assistive device for balance, During functional activity, Bilateral upper extremity supported Standing balance-Leahy Scale: Fair Standing balance comment: fair/good using RW                             Pertinent Vitals/Pain Pain Assessment Pain Assessment: No/denies pain    Home Living Family/patient expects to be discharged  to:: Private residence   Available Help at Discharge: Family Type of Home: House Home Access: Stairs to enter Entrance Stairs-Rails: Right;Left;Can reach both Entrance Stairs-Number of Steps: 2   Home Layout: One level Home Equipment: Agricultural consultant  (2 wheels);Wheelchair - Counsellor (4 wheels)      Prior Function Prior Level of Function : Independent/Modified Independent             Mobility Comments: household ambulation using RW ADLs Comments: Assisted by family     Extremity/Trunk Assessment   Upper Extremity Assessment Upper Extremity Assessment: Generalized weakness    Lower Extremity Assessment Lower Extremity Assessment: Generalized weakness    Cervical / Trunk Assessment Cervical / Trunk Assessment: Normal  Communication   Communication Communication: Impaired Factors Affecting Communication: Hearing impaired    Cognition Arousal: Alert Behavior During Therapy: WFL for tasks assessed/performed   PT - Cognitive impairments: No apparent impairments                         Following commands: Intact       Cueing Cueing Techniques: Verbal cues, Tactile cues     General Comments      Exercises     Assessment/Plan    PT Assessment Patient needs continued PT services  PT Problem List Decreased strength;Decreased activity tolerance;Decreased balance;Decreased mobility       PT Treatment Interventions DME instruction;Gait training;Stair training;Functional mobility training;Therapeutic activities;Therapeutic exercise;Balance training;Patient/family education    PT Goals (Current goals can be found in the Care Plan section)  Acute Rehab PT Goals Patient Stated Goal: return home with family to assist PT Goal Formulation: With patient Time For Goal Achievement: 03/12/24 Potential to Achieve Goals: Good    Frequency Min 3X/week     Co-evaluation               AM-PAC PT 6 Clicks Mobility  Outcome Measure Help needed turning from your back to your side while in a flat bed without using bedrails?: None Help needed moving from lying on your back to sitting on the side of a flat bed without using bedrails?: A Little Help needed moving to and from a bed to a chair  (including a wheelchair)?: A Little Help needed standing up from a chair using your arms (e.g., wheelchair or bedside chair)?: A Little Help needed to walk in hospital room?: A Little Help needed climbing 3-5 steps with a railing? : A Lot 6 Click Score: 18    End of Session Equipment Utilized During Treatment: Oxygen  Activity Tolerance: Patient tolerated treatment well;Patient limited by fatigue Patient left: in chair;with call bell/phone within reach Nurse Communication: Mobility status PT Visit Diagnosis: Unsteadiness on feet (R26.81);Other abnormalities of gait and mobility (R26.89);Muscle weakness (generalized) (M62.81)    Time: 8968-8942 PT Time Calculation (min) (ACUTE ONLY): 26 min   Charges:   PT Evaluation $PT Eval Moderate Complexity: 1 Mod PT Treatments $Therapeutic Activity: 23-37 mins PT General Charges $$ ACUTE PT VISIT: 1 Visit         2:15 PM, 03/09/24 Lynwood Music, MPT Physical Therapist with [facility] [phone] office [phone] mobile phone

## 2024-03-10 ENCOUNTER — Ambulatory Visit (INDEPENDENT_AMBULATORY_CARE_PROVIDER_SITE_OTHER): Admitting: General Surgery

## 2024-03-10 DIAGNOSIS — K625 Hemorrhage of anus and rectum: Secondary | ICD-10-CM | POA: Diagnosis not present

## 2024-03-10 DIAGNOSIS — K6289 Other specified diseases of anus and rectum: Secondary | ICD-10-CM

## 2024-03-10 LAB — BASIC METABOLIC PANEL WITH GFR
Anion gap: 8 (ref 5–15)
BUN: 63 mg/dL — ABNORMAL HIGH (ref 8–23)
CO2: 33 mmol/L — ABNORMAL HIGH (ref 22–32)
Calcium: 7.4 mg/dL — ABNORMAL LOW (ref 8.9–10.3)
Chloride: 97 mmol/L — ABNORMAL LOW (ref 98–111)
Creatinine, Ser: 1.4 mg/dL — ABNORMAL HIGH (ref 0.44–1.00)
GFR, Estimated: 36 mL/min — ABNORMAL LOW (ref >60)
Glucose, Bld: 100 mg/dL — ABNORMAL HIGH (ref 70–99)
Potassium: 5 mmol/L (ref 3.5–5.1)
Sodium: 138 mmol/L (ref 135–145)

## 2024-03-10 LAB — GLUCOSE, CAPILLARY
Glucose-Capillary: 221 mg/dL — ABNORMAL HIGH (ref 70–99)
Glucose-Capillary: 106 mg/dL — ABNORMAL HIGH (ref 70–99)
Glucose-Capillary: 167 mg/dL — ABNORMAL HIGH (ref 70–99)
Glucose-Capillary: 228 mg/dL — ABNORMAL HIGH (ref 70–99)
Glucose-Capillary: 176 mg/dL — ABNORMAL HIGH (ref 70–99)

## 2024-03-10 LAB — CBC
HCT: 28.7 % — ABNORMAL LOW (ref 36.0–46.0)
Hemoglobin: 8.9 g/dL — ABNORMAL LOW (ref 12.0–15.0)
MCH: 31.1 pg (ref 26.0–34.0)
MCHC: 31 g/dL (ref 30.0–36.0)
MCV: 100.3 fL — ABNORMAL HIGH (ref 80.0–100.0)
Platelets: 207 K/uL (ref 150–400)
RBC: 2.86 MIL/uL — ABNORMAL LOW (ref 3.87–5.11)
RDW: 13.7 % (ref 11.5–15.5)
WBC: 6.9 K/uL (ref 4.0–10.5)
nRBC: 0 % (ref 0.0–0.2)

## 2024-03-10 LAB — SURGICAL PATHOLOGY

## 2024-03-10 NOTE — Progress Notes (Signed)
 [facility]  Updated patient and [name] of the path results. Patient had BM yesterday. Eating.   Sitting in chair. BP (!) 115/48 (BP Location: Right Arm)   Pulse 62   Temp 98.3 F (36.8 C) (Oral)   Resp 19   Ht 5' 2 (1.575 m)   Wt 79.9 kg   SpO2 96%   BMI 32.22 kg/m   Will see in the office.  Future Appointments  Date Time Provider Department Center  04/01/2024  1:15 PM Kallie Manuelita BROCKS, MD [facility] None  06/21/2024  2:00 PM [facility] LAB [facility] None  06/21/2024  3:00 PM [facility] NURSE [facility] None  06/28/2024  2:30 PM Lamon Pleasant HERO, PA-C [facility] None     Manuelita Kallie, MD [facility] [address] [place] [address] [place], [place] [address] [phone] (office)

## 2024-03-10 NOTE — Plan of Care (Signed)
   Problem: Education: Goal: Knowledge of General Education information will improve Description Including pain rating scale, medication(s)/side effects and non-pharmacologic comfort measures Outcome: Progressing   Problem: Education: Goal: Knowledge of General Education information will improve Description Including pain rating scale, medication(s)/side effects and non-pharmacologic comfort measures Outcome: Progressing

## 2024-03-10 NOTE — Progress Notes (Signed)
 No cancer or dysplasia on the pathology.  Serrated adenoma.

## 2024-03-10 NOTE — Progress Notes (Signed)
 [facility]                                                                            Paige Rhodes, is a 89 y.o. female, DOB - 01-26-1932, FMW:[id number] Admit date - 03/02/2024    Outpatient Primary MD for the patient is Paige Carwin, MD  LOS - 7  days  Chief Complaint  Patient presents with   Hematochezia       Brief summary   Patient is a 89 year old female with HTN, HLP, DM type II, COPD with chronic respiratory failure on 4 L O2 via [place] at baseline, hypothyroidism, GERD, breast CA status postlumpectomy, CKD stage IIIa presented to ED with rectal bleeding.  Patient reported 3 episodes of bowel movement with bright red blood in the commode, no abdominal pain, fevers or chills. FOBT positive, CT abdomen pelvis showed no acute intra-abdominal or pelvic pathology, sigmoid diverticulosis no bowel obstruction.  Assessment & Plan    Rectal bleed BRBPR - Appreciate assistance and recommendation by GI service - Colonoscopy demonstrating the presence of diverticulosis, polyps and also lesion near anal area with concern for malignancy; biopsy taken and recommendations for transanal resection provided. - Appreciate assistance and recommendation by general surgery; status post surgical removal of vaginal lesion on 03/08/2024.  Procedure well-tolerated.  Diet has been advanced and patient is hemodynamically stable. - No overt bleeding reported - Continue to follow hemoglobin trend intermittently. - Patient denies abdominal pain, nausea and vomiting. - Awaiting hospice equipment, and family to arrange staff at home--anticipate discharge home with hospice on 03/11/2024    Acquired hypothyroidism - Continue Synthroid . - Continue to follow outpatient thyroid  panel.    Mixed hyperlipidemia -Continue Lipitor - Heart healthy/low-fat diet discussed with patient.    Essential hypertension - Stable labetalol - Continue to follow vital signs.    COPD (chronic obstructive pulmonary  disease) (HCC),  Chronic respiratory failure with hypoxia (HCC) - Currently no acute wheezing - Continue chronic oxygen  supplementation (4 L at baseline) -Continue as needed bronchodilator management.    Type 2 diabetes mellitus with hyperglycemia (HCC) Hemoglobin A1c 5.4 -- Awaiting hospice equipment, and family to arrange staff at home--anticipate discharge home with hospice on 03/11/2024   CKD stage 3A - Stable and at baseline - Continue to follow renal function trend.  Obesity class 1  Estimated body mass index is 32.22 kg/m as calculated from the following:   Height as of this encounter: 5' 2 (1.575 m).   Weight as of this encounter: 79.9 kg.  Acute Gout attack - Continue treatment with Uloric  and prednisone  - Patient reporting improvement in right foot pain. - Continue hospice care   Code Status: Full CODE STATUS DVT Prophylaxis:  SCDs Start: 03/03/24 0105   Level of Care: Level of care: Med-Surg Family Communication: Updated patient Disposition Plan:      Remains inpatient appropriate:   Hopefully DC home once medically stable.   Procedures:    Consultants:   Gastroenterology  Antimicrobials:   Anti-infectives (From admission, onward)    Start     Dose/Rate Route Frequency Ordered Stop   03/08/24 1200  metroNIDAZOLE  (FLAGYL ) IVPB 500 mg  Placed in And Linked Group   500 mg 100 mL/hr over 60 Minutes Intravenous On call to O.R. 03/07/24 0841 03/08/24 1021   03/08/24 1200  ciprofloxacin  (CIPRO ) IVPB 400 mg       Placed in And Linked Group   400 mg 200 mL/hr over 60 Minutes Intravenous On call to O.R. 03/07/24 0841 03/08/24 1040   03/08/24 0856  ciprofloxacin  (CIPRO ) 400 MG/200ML IVPB       Note to Pharmacy: Romona Shaver E: cabinet override      03/08/24 0856 03/08/24 1010   03/08/24 0856  metroNIDAZOLE  (FLAGYL ) 500 MG/100ML IVPB       Note to Pharmacy: Romona Shaver E: cabinet override      03/08/24 0856 03/08/24 0951        Medications  atorvastatin   10 mg Oral QHS   carvedilol   25 mg Oral BID WC   docusate sodium   100 mg Oral BID   febuxostat   40 mg Oral Daily   insulin  aspart  0-5 Units Subcutaneous QHS   insulin  aspart  0-9 Units Subcutaneous TID WC   ipratropium-albuterol   3 mL Nebulization TID   levothyroxine   125 mcg Oral Q0600   losartan   50 mg Oral Daily   predniSONE   20 mg Oral Daily    Subjective:   Paige Rhodes -- Awaiting hospice equipment, and family to arrange staff at home--anticipate discharge home with hospice on 03/11/2024 - Resting comfortably  Objective:   Vitals:   03/10/24 1250 03/10/24 1328 03/10/24 1411 03/10/24 1712  BP:  (!) 140/53  (!) 148/63  Pulse:  68  67  Resp:  20    Temp: 98.3 F (36.8 C) 98 F (36.7 C)    TempSrc: Oral Oral    SpO2:  98% 97%   Weight:      Height:        Intake/Output Summary (Last 24 hours) at 03/10/2024 1828 Last data filed at 03/10/2024 1724 Gross per 24 hour  Intake 540 ml  Output 1050 ml  Net -510 ml     Wt Readings from Last 3 Encounters:  03/08/24 79.9 kg  05/12/23 73.7 kg  11/26/22 73.9 kg    Physical Exam  Gen:- Awake Alert, in no acute distress  HEENT:- [place].AT, No sclera icterus Nose-  4L/min Neck-Supple Neck,No JVD,.  Lungs-  CTAB , fair air movement bilaterally  CV- S1, S2 normal, RRR Abd-  +ve B.Sounds, Abd Soft, No tenderness,    Extremity/Skin:- No  edema,   good pedal pulses  Psych-affect is appropriate, oriented x3 Neuro-generalized weakness, no new focal deficits, no tremors  CBC Lab Results  Component Value Date   WBC 6.9 03/10/2024   RBC 2.86 (L) 03/10/2024   HGB 8.9 (L) 03/10/2024   HCT 28.7 (L) 03/10/2024   MCV 100.3 (H) 03/10/2024   MCH 31.1 03/10/2024   PLT 207 03/10/2024   MCHC 31.0 03/10/2024   RDW 13.7 03/10/2024   LYMPHSABS 1.1 03/02/2024   MONOABS 0.4 03/02/2024   EOSABS 0.1 03/02/2024   BASOSABS 0.0 03/02/2024   Last metabolic panel Lab Results  Component Value Date   NA  138 03/10/2024   K 5.0 03/10/2024   CL 97 (L) 03/10/2024   CO2 33 (H) 03/10/2024   BUN 63 (H) 03/10/2024   CREATININE 1.40 (H) 03/10/2024   GLUCOSE 100 (H) 03/10/2024   GFRNONAA 36 (L) 03/10/2024   GFRAA >60 08/31/2019   CALCIUM  7.4 (L) 03/10/2024   PHOS 4.2 03/07/2024  PROT 6.6 03/03/2024   ALBUMIN 3.1 (L) 03/07/2024   LABGLOB 3.5 08/11/2023   AGRATIO 1.1 08/11/2023   BILITOT 0.5 03/03/2024   ALKPHOS 48 03/03/2024   AST 15 03/03/2024   ALT 14 03/03/2024   ANIONGAP 8 03/10/2024   CBG (last 3)  Recent Labs    03/10/24 0725 03/10/24 1127 03/10/24 1624  GLUCAP 106* 167* 228*   Rendall Rhodes M.D. [facility] 03/10/2024, 6:28 PM  Available via Epic secure chat 7am-7pm After 7 pm, please refer to night coverage provider listed on amion.

## 2024-03-10 NOTE — Progress Notes (Signed)
 Nurse at bedside,patient alert and oriented to person,place,and situation,a little confused to time.Blood pressure was 115/48.heart rate 62,Dr courage notified,holding the coreg  this am.Patient out of  bed to chair,no c/o pain or discomfort noted. Plan of care on going.

## 2024-03-11 LAB — GLUCOSE, CAPILLARY
Glucose-Capillary: 98 mg/dL (ref 70–99)
Glucose-Capillary: 139 mg/dL — ABNORMAL HIGH (ref 70–99)

## 2024-03-11 NOTE — Discharge Instructions (Signed)
 1)Very Low-salt diet advised---Less than 2 gm of Sodium per day advised----ok to use Mrs DASH salt substitute instead of Salt 2)Weigh yourself daily, call if you gain more than 3 pounds in 1 day or more than 5 pounds in 1 week as your diuretic medications may need to be adjusted 3)you need oxygen  at home at 3 L via nasal cannula continuously while awake and while asleep--- smoking or having open fires around oxygen  can cause fire, significant injury and death 4) please follow-up with your hospice team as advised

## 2024-03-11 NOTE — Progress Notes (Signed)
 Nurse at bedside,patient alert and oriented times four this am.Patient's blood pressure was 122/49,heart rate 58,Dr courage notified,ok to give coreg  this am.Patient had no c/o pain or discomfort noted.Out of bed to chair. Plan of care on going.

## 2024-03-11 NOTE — TOC Progression Note (Signed)
 Transition of Care [facility]) - Progression Note    Patient Details  Name: Paige Rhodes MRN: [id number] Date of Birth: [birth year]  [facility]) CM/SW Contact  Mcarthur Saddie Kim, [place] Phone Number: 03/11/2024, 8:32 AM  Clinical Narrative:  LCSW notified [facility] of d/c today.      Expected Discharge Plan: Home w Hospice Care Barriers to Discharge: Barriers Resolved  Expected Discharge Plan and Services In-house Referral: NA Discharge Planning Services: NA Post Acute Care Choice: Resumption of Svcs/PTA Provider, Hospice Living arrangements for the past 2 months: Single Family Home Expected Discharge Date: 03/11/24               DME Arranged: N/A DME Agency: NA                   Social Determinants of Health (SDOH) Interventions SDOH Screenings   Food Insecurity: No Food Insecurity (03/03/2024)  Housing: Low Risk  (03/03/2024)  Transportation Needs: No Transportation Needs (03/03/2024)  Utilities: Not At Risk (03/03/2024)  Social Connections: Unknown (03/03/2024)  Tobacco Use: Medium Risk (03/08/2024)    Readmission Risk Interventions    12/10/2021   10:08 AM  Readmission Risk Prevention Plan  Transportation Screening Complete  Home Care Screening Complete  Medication Review (RN CM) Complete

## 2024-03-11 NOTE — Discharge Summary (Signed)
 Paige Rhodes, is a 89 y.o. female  DOB 25-Nov-1931  MRN [id number].  Admission date:  03/02/2024  Admitting Physician  Ripudeep MARLA Distance, MD  Discharge Date:  03/11/2024   Primary MD  [name], MD  Recommendations for primary care physician for things to follow:   1)Very Low-salt diet advised---Less than 2 gm of Sodium per day advised----ok to use Mrs DASH salt substitute instead of Salt 2)Weigh yourself daily, call if you gain more than 3 pounds in 1 day or more than 5 pounds in 1 week as your diuretic medications may need to be adjusted 3)you need oxygen  at home at 3 L via nasal cannula continuously while awake and while asleep--- smoking or having open fires around oxygen  can cause fire, significant injury and death 4) please follow-up with your hospice team as advised  Admission Diagnosis  Rectal bleeding [K62.5] Rectal bleed [K62.5]  Discharge Diagnosis  Rectal bleeding [K62.5] Rectal bleed [K62.5]    Principal Problem:   Rectal bleed Active Problems:   Acquired hypothyroidism   Mixed hyperlipidemia   Essential hypertension   COPD (chronic obstructive pulmonary disease) (HCC)   Normocytic anemia   Chronic respiratory failure with hypoxia (HCC)   Type 2 diabetes mellitus with hyperglycemia (HCC)   Chronic kidney disease, stage 3a (HCC)   Rectal bleeding   Rectal mass   Adenomatous polyp of sigmoid colon      Past Medical History:  Diagnosis Date   Breast cancer (HCC)    Chronic renal disease, stage 2, mildly decreased glomerular filtration rate between 60-89 mL/min/1.73 square meter 10/31/2014   COPD (chronic obstructive pulmonary disease) (HCC)    Diabetes mellitus    GERD (gastroesophageal reflux disease)    occasional   Glaucoma    Gout attack june 2013   left foot   Hard of hearing    Hyperlipidemia    Hypertension    Hypothyroidism    Infiltrating ductal carcinoma of left breast  03/05/2011   On home oxygen  therapy    2 LPM   Osteoporosis    Port catheter in place 01/15/2012   SOB (shortness of breath)    Thyroid  disease     Past Surgical History:  Procedure Laterality Date   ANKLE SURGERY  1998   APPENDECTOMY  1955   BREAST LUMPECTOMY  2011   left   CATARACT EXTRACTION W/PHACO  09/03/2012   Procedure: CATARACT EXTRACTION PHACO AND INTRAOCULAR LENS PLACEMENT (IOC);  Surgeon: Cherene Mania, MD;  Location: [facility];  Service: Ophthalmology;  Laterality: Right;  CDE: 15.26   CATARACT EXTRACTION W/PHACO  09/28/2012   Procedure: CATARACT EXTRACTION PHACO AND INTRAOCULAR LENS PLACEMENT (IOC);  Surgeon: Cherene Mania, MD;  Location: [facility];  Service: Ophthalmology;  Laterality: Left;  CDE:  21.60   CHOLECYSTECTOMY  1990   COLONOSCOPY N/A 03/04/2024   Procedure: COLONOSCOPY;  Surgeon: Cinderella [name] FALCON, MD;  Location: [facility];  Service: Endoscopy;  Laterality: N/A;   INGUINAL HERNIA REPAIR Left 11/14/2022   Procedure: INCARCERATED INGUINAL  HERNIA REPAIR WITH MESH;  Surgeon: Evonnie Dorothyann LABOR, DO;  Location: [facility];  Service: General;  Laterality: Left;   OPEN REDUCTION INTERNAL FIXATION (ORIF) DISTAL RADIAL FRACTURE Left 07/01/2019   Procedure: OPEN REDUCTION INTERNAL FIXATION (ORIF) LEFT DISTAL RADIAL FRACTURE;  Surgeon: Margrette Taft BRAVO, MD;  Location: [facility];  Service: Orthopedics;  Laterality: Left;   PORT-A-CATH REMOVAL N/A 04/26/2015   Procedure: REMOVAL PORT-A-CATH;  Surgeon: Oneil Budge, MD;  Location: [facility];  Service: General;  Laterality: N/A;   PORTACATH PLACEMENT  05/23/10   THYROID  SURGERY  1994   TONSILLECTOMY     TOTAL HIP ARTHROPLASTY  1996   TRANSANAL EXCISION OF RECTAL MASS N/A 03/08/2024   Procedure: EXCISION, MASS, RECTUM, ANAL APPROACH;  Surgeon: Kallie Manuelita BROCKS, MD;  Location: [facility];  Service: General;  Laterality: N/A;     HPI  from the history and physical done on the day of admission:    HPI: Paige Rhodes is a 89 y.o. female with  medical history significant of hypertension, hyperlipidemia, T2DM, chronic respiratory failure with hypoxia on 4L of oxygen  at baseline, COPD, hypothyroidism, GERD, breast CA s/p lumpectomy, stage IIIa CKD who presents to the emergency department accompanied by daughter due to rectal bleed.  Patient endorsed 3 episodes of bowel movement with noted blood in the commode.  She denies abdominal pain, fever, chills.   ED Course:  BP 151/69, other vital signs are within normal range.  Workup in the ED showed normocytic anemia, BMP was normal except for chloride of 94, bicarb 39, blood glucose 215, BUN 27, GFR 56.  POC occult blood was positive. CT abdomen and pelvis with contrast showed no acute intra-abdominal or pelvic pathology.  Sigmoid diverticulosis.  No bowel obstruction She was provided with breathing treatment in the ED.  [facility] was asked to admit patient   Review of Systems: Review of systems as noted in the HPI. All other systems reviewed and are negative.     Hospital Course:     Brief Summary:- Patient is a 89 year old female with HTN, HLP, DM type II, COPD with chronic respiratory failure on 4 L O2 via [place] at baseline, hypothyroidism, GERD, breast CA status postlumpectomy, CKD stage IIIa presented to ED with rectal bleeding.  Patient reported 3 episodes of bowel movement with bright red blood in the commode, no abdominal pain, fevers or chills. FOBT positive, CT abdomen pelvis showed no acute intra-abdominal or pelvic pathology, sigmoid diverticulosis no bowel obstruction.   A/p Rectal bleed BRBPR - Appreciate assistance and recommendation by GI service - Colonoscopy demonstrating the presence of diverticulosis, polyps and also lesion near anal area - biopsy consistent with adenoma without high-grade dysplasia or malignancy  and recommendations for transanal resection. - Appreciate assistance and recommendation by general surgery; status post surgical removal of Anal lesion on 03/08/2024.   Procedure well-tolerated.  Diet has been advanced and patient is hemodynamically stable. - No overt bleeding reported - Continue to follow hemoglobin trend intermittently. - Patient denies abdominal pain, nausea and vomiting. - Home with hospice team and hospice equipment,      Acquired hypothyroidism Continue levothyroxine      Mixed hyperlipidemia --Consider stopping Lipitor as risk versus benefit is questionable at this time given overall poor prognosis as noted above #1     Essential hypertension - Stable labetalol - Continue to follow vital signs.     COPD (chronic obstructive pulmonary disease) (HCC),  Chronic respiratory failure with hypoxia (HCC) - Currently no acute  wheezing - Continue chronic oxygen  supplementation (3 to 4 L at baseline) -Continue as needed bronchodilator management.     Type 2 diabetes mellitus with hyperglycemia (HCC) Hemoglobin A1c 5.4 --  okay to feed liberally   CKD stage 3A - Stable and at baseline - Continue to follow renal function trend.   Obesity class 1  Estimated body mass index is 32.22 kg/m as calculated from the following:   Height as of this encounter: 5' 2 (1.575 m).   Weight as of this encounter: 79.9 kg.   Acute Gout attack - Continue treatment with Uloric  and prednisone  - Patient reporting improvement in right foot pain. - Continue hospice care to help with pain management   Discharge Condition: stable  Follow UP   Follow-up Information     Kallie Manuelita BROCKS, MD Follow up on 04/01/2024.   Specialty: General Surgery Why: post operative appointment Contact information: [address] [facility] [address] [phone]                  Consults obtained -GI and general surgery  Diet and Activity recommendation:  As advised  Discharge Instructions    Discharge Instructions     Call MD for:  difficulty breathing, headache or visual disturbances   Complete by: As directed    Call MD for:  persistant  nausea and vomiting   Complete by: As directed    Call MD for:  severe uncontrolled pain   Complete by: As directed    Call MD for:  temperature >100.4   Complete by: As directed    Diet general   Complete by: As directed    Discharge instructions   Complete by: As directed    1)Very Low-salt diet advised---Less than 2 gm of Sodium per day advised----ok to use Mrs DASH salt substitute instead of Salt 2)Weigh yourself daily, call if you gain more than 3 pounds in 1 day or more than 5 pounds in 1 week as your diuretic medications may need to be adjusted 3)you need oxygen  at home at 3 L via nasal cannula continuously while awake and while asleep--- smoking or having open fires around oxygen  can cause fire, significant injury and death 4) please follow-up with your hospice team as advised   Discharge wound care:   Complete by: As directed    As advised   Increase activity slowly   Complete by: As directed         Discharge Medications     Allergies as of 03/11/2024       Reactions   Penicillins Anaphylaxis   Edema of throat   Hctz [hydrochlorothiazide] Other (See Comments)   Gout    Lotrel [amlodipine  Besy-benazepril Hcl] Other (See Comments)   Facial muscles draw and break out in whilps   Morphine  And Codeine  Itching        Medication List     STOP taking these medications    phenazopyridine 95 MG tablet Commonly known as: PYRIDIUM       TAKE these medications    acetaminophen  500 MG tablet Commonly known as: TYLENOL  Take 2 tablets (1,000 mg total) by mouth every 6 (six) hours as needed for mild pain, moderate pain, fever or headache.   albuterol  108 (90 Base) MCG/ACT inhaler Commonly known as: VENTOLIN  HFA Inhale 2 puffs into the lungs every 6 (six) hours as needed for wheezing or shortness of breath.   amLODipine  5 MG tablet Commonly known as: NORVASC  Take 5 mg by  mouth daily.   aspirin  EC 81 MG tablet Take 81 mg by mouth at bedtime.   atorvastatin  10  MG tablet Commonly known as: LIPITOR Take 10 mg by mouth daily.   CALCIUM  500 PO Take 1 tablet by mouth every other day.   carvedilol  25 MG tablet Commonly known as: COREG  Take 25 mg by mouth 2 (two) times daily with a meal.   estradiol  0.1 MG/GM vaginal cream Commonly known as: ESTRACE  INSERT 1/2 INCH IN APPLICATOR VAGINALLY TWICE WEEKLY. What changed:  how much to take how to take this when to take this   furosemide  20 MG tablet Commonly known as: LASIX  Take 20 mg by mouth daily as needed for fluid.   GoodSense ClearLax 17 GM/SCOOP powder Generic drug: polyethylene glycol powder Take 17 g by mouth daily as needed for mild constipation.   hydrocortisone 2.5 % rectal cream Commonly known as: ANUSOL-HC Apply 1 Application topically 2 (two) times daily.   ipratropium-albuterol  0.5-2.5 (3) MG/3ML Soln Commonly known as: DUONEB Take 3 mLs by nebulization every 4 (four) hours as needed (wheezing, shortness of breath).   ketoconazole 2 % cream Commonly known as: NIZORAL Apply 1 Application topically 2 (two) times daily.   lactobacillus acidophilus Tabs tablet Take 1 tablet by mouth daily.   levothyroxine  125 MCG tablet Commonly known as: SYNTHROID  Take 125 mcg by mouth daily.   losartan  50 MG tablet Commonly known as: COZAAR  Take 50 mg by mouth daily.   methenamine  1 g tablet Commonly known as: HIPREX  1/2 tab twice daily What changed:  how much to take how to take this when to take this additional instructions   multivitamin tablet Take 1 tablet by mouth daily.   predniSONE  5 MG tablet Commonly known as: DELTASONE  Take 5 mg by mouth daily.   VITAMIN C PO Take 1 tablet by mouth daily.               Discharge Care Instructions  (From admission, onward)           Start     Ordered   03/11/24 0000  Discharge wound care:       Comments: As advised   03/11/24 1321            Major procedures and Radiology Reports - PLEASE review  detailed and final reports for all details, in brief -   CT ABDOMEN PELVIS W CONTRAST Result Date: 03/02/2024 CLINICAL DATA:  Abdominal pain. EXAM: CT ABDOMEN AND PELVIS WITH CONTRAST TECHNIQUE: Multidetector CT imaging of the abdomen and pelvis was performed using the standard protocol following bolus administration of intravenous contrast. RADIATION DOSE REDUCTION: This exam was performed according to the departmental dose-optimization program which includes automated exposure control, adjustment of the mA and/or kV according to patient size and/or use of iterative reconstruction technique. CONTRAST:  OMNIPAQUE  IOHEXOL  300 MG/ML  SOLN COMPARISON:  CT abdomen pelvis dated 11/14/2022. FINDINGS: Lower chest: The visualized lung bases are clear. There is coronary vascular calcification. No intra-abdominal free air or free fluid. Hepatobiliary: The liver is unremarkable. There is mild dilatation, post cholecystectomy. No retained calcified stone noted in the central CBD. Pancreas: Unremarkable. No pancreatic ductal dilatation or surrounding inflammatory changes. Spleen: Normal in size without focal abnormality. Adrenals/Urinary Tract: The adrenal glands unremarkable. There is no hydronephrosis on either side. There is symmetric enhancement and excretion of contrast by both kidneys. Small left renal posterior lower pole cyst. The urinary bladder is grossly unremarkable. Stomach/Bowel: There is sigmoid  diverticulosis. There is moderate stool throughout the colon. There is no bowel obstruction or active inflammation. Appendectomy. Vascular/Lymphatic: Moderate aortoiliac atherosclerotic disease. The IVC is unremarkable. No portal venous gas. There is no adenopathy. Reproductive: The uterus is grossly unremarkable no suspicious adnexal masses. Other: None Musculoskeletal: Osteopenia with degenerative changes of the spine. Total right hip arthroplasty. Moderate arthritic changes of the left hip. No acute osseous  pathology. IMPRESSION: 1. No acute intra-abdominal or pelvic pathology. 2. Sigmoid diverticulosis. No bowel obstruction. 3.  Aortic Atherosclerosis (ICD10-I70.0). Electronically Signed   By: Vanetta Chou M.D.   On: 03/02/2024 19:13    Micro Results   Recent Results (from the past 240 hours)  Surgical pcr screen     Status: None   Collection Time: 03/08/24  7:19 AM   Specimen: Nasal Mucosa; Nasal Swab  Result Value Ref Range Status   MRSA, PCR NEGATIVE NEGATIVE Final   Staphylococcus aureus NEGATIVE NEGATIVE Final    Comment: (NOTE) The Xpert SA Assay (FDA approved for NASAL specimens in patients 42 years of age and older), is one component of a comprehensive surveillance program. It is not intended to diagnose infection nor to guide or monitor treatment. Performed at [facility], [address], [place], [place] [address]     Today   Subjective    Paige Rhodes today has no new complaints - Family at bedside, questions answered - Patient with generalized weakness and fatigue and appetite is not great    No fever  Or chills   No Nausea, Vomiting or Diarrhea   Patient has been seen and examined prior to discharge   Objective   Blood pressure (!) 149/56, pulse 68, temperature 98 F (36.7 C), temperature source Oral, resp. rate (!) 24, height 5' 2 (1.575 m), weight 79.9 kg, SpO2 97%.   Intake/Output Summary (Last 24 hours) at 03/11/2024 1326 Last data filed at 03/11/2024 1236 Gross per 24 hour  Intake 240 ml  Output 1000 ml  Net -760 ml    Exam  Gen:- Awake Alert, in no acute distress  HEENT:- [place].AT, No sclera icterus Nose- [place] 3L/min Neck-Supple Neck,No JVD,.  Lungs-  CTAB , fair air movement bilaterally  CV- S1, S2 normal, RRR Abd-  +ve B.Sounds, Abd Soft, No tenderness,    Extremity/Skin:- No  edema,   good pedal pulses  Psych-affect is appropriate, oriented x3 Neuro-generalized weakness, no new focal deficits, no tremors   Data Review   CBC w Diff:   Lab Results  Component Value Date   WBC 6.9 03/10/2024   HGB 8.9 (L) 03/10/2024   HCT 28.7 (L) 03/10/2024   PLT 207 03/10/2024   LYMPHOPCT 16 03/02/2024   MONOPCT 5 03/02/2024   EOSPCT 2 03/02/2024   BASOPCT 0 03/02/2024    CMP:  Lab Results  Component Value Date   NA 138 03/10/2024   K 5.0 03/10/2024   CL 97 (L) 03/10/2024   CO2 33 (H) 03/10/2024   BUN 63 (H) 03/10/2024   CREATININE 1.40 (H) 03/10/2024   PROT 6.6 03/03/2024   ALBUMIN 3.1 (L) 03/07/2024   BILITOT 0.5 03/03/2024   ALKPHOS 48 03/03/2024   AST 15 03/03/2024   ALT 14 03/03/2024  .  Total Discharge time is about 33 minutes  Rendall Carwin M.D on 03/11/2024 at 1:26 PM  Go to [email or web address] -  for contact info  [facility] - Office  [phone]

## 2024-03-11 NOTE — Progress Notes (Addendum)
 Patient discharged home with instructions given on medications and follow up visits,verbalized understanding IV   discontinued,catheter intact. Staff to accompany patient to awaiting vehicle.

## 2024-03-11 NOTE — Care Management Important Message (Signed)
 Important Message  Patient Details IM Letter mailed to home. Name: Paige Rhodes MRN: [id number] Date of Birth: 06-11-1932   Important Message Given:  Yes - Medicare IM     Hiromi Knodel 03/11/2024, 9:55 AM

## 2024-03-12 NOTE — Transitions of Care (Post Inpatient/ED Visit) (Signed)
 03/12/2024  Patient ID: Paige Rhodes, female   DOB: 01-18-1932, 89 y.o.   MRN: [id number]  Chart Review for transitions of care. Patient is active with [facility]. Closing for TOC.   Terrel Manalo J. Naturi Alarid RN, MSN [facility], [facility] RN Care Manager Direct Dial: [phone]  Fax: [phone] Website: [email or web address]

## 2024-03-15 NOTE — Progress Notes (Signed)
 Spoke with patient daughter [name] that both the surgical specimen and colonoscopy biopsies did not show malignancy. This is great news !  I reached out to the pathologist who kindly reviewed the both paths again and confirmed no malignancy but had premalignant lesion which is resected .   the diagnosis on the excisional specimen was traditional serrated adenoma (TSA), not sessile serrated polyp. These are different entities with a TSA having adenomatous changes (low grade dysplasia) as part of the definition, unlike SSPs. The original biopsy showed histologic features intermediate between TSA and TVA, but on viewing the excision the overall features are most consistent with TSA.

## 2024-03-16 ENCOUNTER — Encounter (HOSPITAL_COMMUNITY): Payer: Self-pay | Admitting: Hematology

## 2024-03-17 ENCOUNTER — Telehealth: Payer: Self-pay | Admitting: *Deleted

## 2024-03-17 NOTE — Telephone Encounter (Signed)
 Surgical Date: 03/08/2024 Procedure: Excision Rectal Mass  Received call from Darice, RN with [facility].  Reports that patient has had x1 episode of bright red bleeding noted from rectum. States that patient did have BM and thinks she dislodged blood clot to rectum. States that bright red blood was noted when patient wiped, and small amount noted on pad in briefs.   Advised that some bleeding is to be expected. Advised that as long as no copious bleeding noted (soaking menstrual pad in less than 1 hour) to continue to monitor. Advised if copious bleeding noted, patient will need to go to ER for evaluation.   Advised to keep scheduled follow up with Dr.Bridges.

## 2024-03-29 DIAGNOSIS — R0602 Shortness of breath: Secondary | ICD-10-CM | POA: Diagnosis not present

## 2024-03-29 DIAGNOSIS — R0989 Other specified symptoms and signs involving the circulatory and respiratory systems: Secondary | ICD-10-CM | POA: Diagnosis not present

## 2024-03-30 ENCOUNTER — Encounter: Admitting: General Surgery

## 2024-03-30 ENCOUNTER — Telehealth: Payer: Self-pay | Admitting: *Deleted

## 2024-03-30 NOTE — Telephone Encounter (Signed)
 Surgical Date: 03/08/2024 Procedure: Excision Mass, Rectum   Received call from Myla, LPN with facility, [facility].   Reports that patient is requesting to cancel upcoming appointment for post op. Reports that patient is new to using oxygen  tanks and is fearful that her supply will run out. States that patient seems to have panic attack thinking about leaving facility. Reports that patient was shut in prior to coming to facility since she only had stationary oxygen  concentrator.   Reports that patient has had not any further complaints of bleeding or pain.   Dr. Kallie to be made aware.

## 2024-04-01 ENCOUNTER — Encounter: Admitting: General Surgery

## 2024-04-29 DIAGNOSIS — E782 Mixed hyperlipidemia: Secondary | ICD-10-CM | POA: Diagnosis not present

## 2024-04-29 DIAGNOSIS — E119 Type 2 diabetes mellitus without complications: Secondary | ICD-10-CM | POA: Diagnosis not present

## 2024-04-29 DIAGNOSIS — I10 Essential (primary) hypertension: Secondary | ICD-10-CM | POA: Diagnosis not present

## 2024-04-29 DIAGNOSIS — E559 Vitamin D deficiency, unspecified: Secondary | ICD-10-CM | POA: Diagnosis not present

## 2024-04-29 DIAGNOSIS — E039 Hypothyroidism, unspecified: Secondary | ICD-10-CM | POA: Diagnosis not present

## 2024-05-25 DIAGNOSIS — N390 Urinary tract infection, site not specified: Secondary | ICD-10-CM | POA: Diagnosis not present

## 2024-06-14 ENCOUNTER — Encounter (HOSPITAL_COMMUNITY): Payer: Self-pay | Admitting: Oncology

## 2024-06-21 ENCOUNTER — Inpatient Hospital Stay

## 2024-06-28 ENCOUNTER — Inpatient Hospital Stay: Admitting: Physician Assistant
